# Patient Record
Sex: Female | Born: 1940
Health system: Southern US, Community
[De-identification: ages and names within clinical notes are randomized; demographics above are authoritative.]

## PROBLEM LIST (undated history)

## (undated) DIAGNOSIS — I10 Essential (primary) hypertension: Secondary | ICD-10-CM

## (undated) DIAGNOSIS — C50919 Malignant neoplasm of unspecified site of unspecified female breast: Secondary | ICD-10-CM

## (undated) DIAGNOSIS — I639 Cerebral infarction, unspecified: Secondary | ICD-10-CM

## (undated) DIAGNOSIS — K5792 Diverticulitis of intestine, part unspecified, without perforation or abscess without bleeding: Secondary | ICD-10-CM

## (undated) DIAGNOSIS — M779 Enthesopathy, unspecified: Secondary | ICD-10-CM

## (undated) DIAGNOSIS — Z87448 Personal history of other diseases of urinary system: Secondary | ICD-10-CM

## (undated) DIAGNOSIS — I749 Embolism and thrombosis of unspecified artery: Secondary | ICD-10-CM

## (undated) HISTORY — DX: Essential (primary) hypertension: I10

## (undated) HISTORY — DX: Cerebral infarction, unspecified: I63.9

## (undated) HISTORY — DX: Personal history of other diseases of urinary system: Z87.448

## (undated) HISTORY — PX: TEAR DUCT PROBING: SHX793

## (undated) HISTORY — DX: Enthesopathy, unspecified: M77.9

## (undated) HISTORY — DX: Malignant neoplasm of unspecified site of unspecified female breast: C50.919

## (undated) HISTORY — DX: Diverticulitis of intestine, part unspecified, without perforation or abscess without bleeding: K57.92

## (undated) HISTORY — DX: Embolism and thrombosis of unspecified artery: I74.9

---

## 1966-07-31 HISTORY — PX: BREAST BIOPSY: SHX20

## 1970-07-31 HISTORY — PX: OTHER SURGICAL HISTORY: SHX169

## 1972-07-31 HISTORY — PX: APPENDECTOMY: SHX54

## 1985-07-31 HISTORY — PX: ABDOMINAL HYSTERECTOMY: SHX81

## 2000-07-31 HISTORY — PX: NASAL SINUS SURGERY: SHX719

## 2001-11-19 ENCOUNTER — Encounter: Payer: Self-pay | Admitting: *Deleted

## 2001-11-19 ENCOUNTER — Emergency Department (HOSPITAL_COMMUNITY): Admission: EM | Admit: 2001-11-19 | Discharge: 2001-11-19 | Payer: Self-pay | Admitting: *Deleted

## 2002-07-25 ENCOUNTER — Encounter: Payer: Self-pay | Admitting: *Deleted

## 2002-07-25 ENCOUNTER — Emergency Department (HOSPITAL_COMMUNITY): Admission: EM | Admit: 2002-07-25 | Discharge: 2002-07-25 | Payer: Self-pay | Admitting: Emergency Medicine

## 2005-06-06 ENCOUNTER — Ambulatory Visit (HOSPITAL_COMMUNITY): Admission: RE | Admit: 2005-06-06 | Discharge: 2005-06-06 | Payer: Self-pay | Admitting: Internal Medicine

## 2005-06-13 ENCOUNTER — Encounter: Admission: RE | Admit: 2005-06-13 | Discharge: 2005-06-13 | Payer: Self-pay | Admitting: Internal Medicine

## 2006-10-01 ENCOUNTER — Ambulatory Visit (HOSPITAL_COMMUNITY): Admission: RE | Admit: 2006-10-01 | Discharge: 2006-10-01 | Payer: Self-pay | Admitting: Internal Medicine

## 2006-12-22 ENCOUNTER — Emergency Department (HOSPITAL_COMMUNITY): Admission: EM | Admit: 2006-12-22 | Discharge: 2006-12-22 | Payer: Self-pay | Admitting: Emergency Medicine

## 2007-03-15 ENCOUNTER — Ambulatory Visit (HOSPITAL_COMMUNITY): Admission: RE | Admit: 2007-03-15 | Discharge: 2007-03-15 | Payer: Self-pay | Admitting: Internal Medicine

## 2007-03-20 ENCOUNTER — Ambulatory Visit (HOSPITAL_COMMUNITY): Admission: RE | Admit: 2007-03-20 | Discharge: 2007-03-20 | Payer: Self-pay | Admitting: Internal Medicine

## 2007-11-19 ENCOUNTER — Inpatient Hospital Stay (HOSPITAL_COMMUNITY): Admission: EM | Admit: 2007-11-19 | Discharge: 2007-11-22 | Payer: Self-pay | Admitting: Emergency Medicine

## 2007-11-20 ENCOUNTER — Ambulatory Visit: Payer: Self-pay | Admitting: Gastroenterology

## 2007-11-21 ENCOUNTER — Ambulatory Visit: Payer: Self-pay | Admitting: Gastroenterology

## 2007-11-21 ENCOUNTER — Encounter: Payer: Self-pay | Admitting: Gastroenterology

## 2007-12-12 ENCOUNTER — Ambulatory Visit (HOSPITAL_COMMUNITY): Admission: RE | Admit: 2007-12-12 | Discharge: 2007-12-12 | Payer: Self-pay | Admitting: Internal Medicine

## 2008-01-21 ENCOUNTER — Ambulatory Visit: Payer: Self-pay | Admitting: Gastroenterology

## 2008-05-06 ENCOUNTER — Ambulatory Visit: Payer: Self-pay | Admitting: Gastroenterology

## 2008-12-14 ENCOUNTER — Ambulatory Visit (HOSPITAL_COMMUNITY): Admission: RE | Admit: 2008-12-14 | Discharge: 2008-12-14 | Payer: Self-pay | Admitting: Internal Medicine

## 2008-12-24 ENCOUNTER — Ambulatory Visit (HOSPITAL_COMMUNITY): Admission: RE | Admit: 2008-12-24 | Discharge: 2008-12-24 | Payer: Self-pay | Admitting: Internal Medicine

## 2008-12-29 ENCOUNTER — Encounter (INDEPENDENT_AMBULATORY_CARE_PROVIDER_SITE_OTHER): Payer: Self-pay | Admitting: Diagnostic Radiology

## 2008-12-29 ENCOUNTER — Encounter: Admission: RE | Admit: 2008-12-29 | Discharge: 2008-12-29 | Payer: Self-pay | Admitting: Internal Medicine

## 2009-01-04 ENCOUNTER — Ambulatory Visit (HOSPITAL_COMMUNITY): Admission: RE | Admit: 2009-01-04 | Discharge: 2009-01-04 | Payer: Self-pay | Admitting: Internal Medicine

## 2009-01-22 ENCOUNTER — Encounter (INDEPENDENT_AMBULATORY_CARE_PROVIDER_SITE_OTHER): Payer: Self-pay | Admitting: General Surgery

## 2009-01-22 ENCOUNTER — Inpatient Hospital Stay (HOSPITAL_COMMUNITY): Admission: RE | Admit: 2009-01-22 | Discharge: 2009-01-24 | Payer: Self-pay | Admitting: General Surgery

## 2009-01-22 HISTORY — PX: MASTECTOMY MODIFIED RADICAL: SUR848

## 2009-02-24 ENCOUNTER — Ambulatory Visit (HOSPITAL_COMMUNITY): Payer: Self-pay | Admitting: Oncology

## 2009-03-03 ENCOUNTER — Ambulatory Visit (HOSPITAL_COMMUNITY): Admission: RE | Admit: 2009-03-03 | Discharge: 2009-03-03 | Payer: Self-pay | Admitting: Oncology

## 2009-05-31 ENCOUNTER — Encounter: Payer: Self-pay | Admitting: Urgent Care

## 2009-08-24 ENCOUNTER — Ambulatory Visit (HOSPITAL_COMMUNITY): Payer: Self-pay | Admitting: Oncology

## 2009-12-16 ENCOUNTER — Ambulatory Visit (HOSPITAL_COMMUNITY): Admission: RE | Admit: 2009-12-16 | Discharge: 2009-12-16 | Payer: Self-pay | Admitting: Oncology

## 2010-02-17 ENCOUNTER — Ambulatory Visit: Payer: Self-pay | Admitting: Oncology

## 2010-02-22 ENCOUNTER — Encounter (HOSPITAL_COMMUNITY): Admission: RE | Admit: 2010-02-22 | Discharge: 2010-03-24 | Payer: Self-pay | Admitting: Oncology

## 2010-02-22 ENCOUNTER — Ambulatory Visit (HOSPITAL_COMMUNITY): Payer: Self-pay | Admitting: Oncology

## 2010-07-13 ENCOUNTER — Encounter: Payer: Self-pay | Admitting: Orthopedic Surgery

## 2010-07-13 ENCOUNTER — Emergency Department (HOSPITAL_COMMUNITY)
Admission: EM | Admit: 2010-07-13 | Discharge: 2010-07-13 | Payer: Self-pay | Source: Home / Self Care | Admitting: Emergency Medicine

## 2010-07-31 DIAGNOSIS — M779 Enthesopathy, unspecified: Secondary | ICD-10-CM

## 2010-07-31 HISTORY — DX: Enthesopathy, unspecified: M77.9

## 2010-08-09 ENCOUNTER — Encounter: Payer: Self-pay | Admitting: Orthopedic Surgery

## 2010-08-10 ENCOUNTER — Ambulatory Visit
Admission: RE | Admit: 2010-08-10 | Discharge: 2010-08-10 | Payer: Self-pay | Source: Home / Self Care | Attending: Orthopedic Surgery | Admitting: Orthopedic Surgery

## 2010-08-10 DIAGNOSIS — M654 Radial styloid tenosynovitis [de Quervain]: Secondary | ICD-10-CM | POA: Insufficient documentation

## 2010-08-21 ENCOUNTER — Encounter: Payer: Self-pay | Admitting: Internal Medicine

## 2010-08-22 ENCOUNTER — Encounter: Payer: Self-pay | Admitting: Internal Medicine

## 2010-08-23 ENCOUNTER — Ambulatory Visit (HOSPITAL_COMMUNITY)
Admission: RE | Admit: 2010-08-23 | Discharge: 2010-08-30 | Payer: Self-pay | Source: Home / Self Care | Attending: Oncology | Admitting: Oncology

## 2010-08-23 ENCOUNTER — Encounter (HOSPITAL_COMMUNITY)
Admission: RE | Admit: 2010-08-23 | Discharge: 2010-08-30 | Payer: Self-pay | Source: Home / Self Care | Attending: Oncology | Admitting: Oncology

## 2010-08-31 ENCOUNTER — Ambulatory Visit (INDEPENDENT_AMBULATORY_CARE_PROVIDER_SITE_OTHER): Payer: Medicare Other | Admitting: Orthopedic Surgery

## 2010-08-31 ENCOUNTER — Ambulatory Visit: Admit: 2010-08-31 | Payer: Self-pay | Admitting: Orthopedic Surgery

## 2010-08-31 ENCOUNTER — Encounter: Payer: Self-pay | Admitting: Orthopedic Surgery

## 2010-08-31 DIAGNOSIS — M654 Radial styloid tenosynovitis [de Quervain]: Secondary | ICD-10-CM

## 2010-09-01 NOTE — Assessment & Plan Note (Signed)
Summary: ap er left wrist pain may need new xr/bcbs/bsf   Vital Signs:  Patient profile:   70 year old female Height:      66.5 inches Weight:      137 pounds Pulse rate:   76 / minute Resp:     16 per minute  Vitals Entered By: Fuller Canada MD (August 10, 2010 9:05 AM)  Visit Type:  new patient Referring Provider:  ap er Primary Provider:  Dr. Felecia Shelling  CC:  left wrist.  History of Present Illness: I saw [name] in the office today for an initial visit.  She is a 70 years old woman with the complaint of:  left wrist and thumb pain.  No injury.  07/13/10 left wrist xrays [facility].  Meds: Vitamin D, Calcium plus D, Anastrozole, Lovastatin, Nexium, Metoprolol.  70 year old female complains of pain over the LEFT wrist and thumb described as sharp, stabbing and burning. She rates her pain 10 out of 10 and says it is constant. She denies any trauma. It started in November of last year. She's had a history of carpal tunnel syndrome.  She is wearing a rhino splint.    Allergies (verified): No Known Drug Allergies  Past History:  Past Medical History: soft bones htn cholesterol COPD acid reflux  Past Surgical History: hysterectomy sinus appendix cancer rt breast removed cyst from left breast belly button  Family History: FH of Cancer:  Family History of Diabetes Family History of Arthritis  Social History: Patient is divorced.  retired smokes 1/2 ppd no alcohol 3 cups of coffee per day GED  Review of Systems Constitutional:  Denies weight loss, weight gain, fever, chills, and fatigue. Cardiovascular:  Denies chest pain, palpitations, fainting, and murmurs. Respiratory:  Complains of short of breath; denies wheezing, couch, tightness, pain on inspiration, and snoring . Gastrointestinal:  Complains of blood in your stools; denies heartburn, nausea, vomiting, diarrhea, and constipation. Genitourinary:  Denies frequency, urgency, difficulty urinating,  painful urination, flank pain, and bleeding in urine. Neurologic:  Denies numbness, tingling, unsteady gait, dizziness, tremors, and seizure. Musculoskeletal:  Complains of joint pain; denies swelling, instability, stiffness, redness, heat, and muscle pain. Endocrine:  Denies excessive thirst, exessive urination, and heat or cold intolerance. Psychiatric:  Denies nervousness, depression, anxiety, and hallucinations. Skin:  Denies changes in the skin, poor healing, rash, itching, and redness. HEENT:  Denies blurred or double vision, eye pain, redness, and watering. Immunology:  Complains of seasonal allergies; denies sinus problems and allergic to bee stings. Hemoatologic:  Denies easy bleeding and brusing.  Physical Exam  Skin:  intact without lesions or rashes Psych:  alert and cooperative; normal mood and affect; normal attention span and concentration   Wrist/Hand Exam  General:    Well-developed, well-nourished, in no acute distress; alert and oriented x 3.    Skin:    Intact with no erythema; no scarring.    Inspection:    swelling: LEFT thumb  Palpation:    tenderness over the LEFT thumb 1st extensor compartment  Vascular:    normal  Sensory:    normal   Motor:    normal   Reflexes:    n/t  Wrist Exam:    Left:    Stability:  stable    Tenderness:  no  Hand Exam:    Left:    Inspection:  Abnormal    Palpation:  Abnormal    the thumb does subluxate in and out of joint, but it's a partial subluxation  is not symptomatic. There is no grinding   Impression & Recommendations:  Problem # 1:  DEQUERVAIN'S (ICD-727.04)  The x-rays were done at [facility]. The report and the films have been reviewed. the basilar joint of the thumb is subluxated approximately 50%.  This was not seem to be symptomatic for the patient.  We did inject for de Quervain's syndrome  Verbal consent was obtained: The left thumb was prepped with ethyl chloride and injected with 1:1  injection of .25% sensorcaine, 1cc  and 40 mg of depomedrol, 1cc. There were no complications.  Orders: New Patient Level III (16109) Injection, Tendon / Ligament (60454) Depo- Medrol 40mg  (J1030)  Patient Instructions: 1)  Tendonitis of the thumb  2)  You have received an injection of cortisone today. You may experience increased pain at the injection site. Apply ice pack to the area for 20 minutes every 2 hours and take 2 xtra strength tylenol every 8 hours. This increased pain will usually resolve in 24 hours. The injection will take effect in 3-10 days.   3)  wear splint  4)  take ibuprofen  5)  Please schedule a follow-up appointment in 3 weeks.   Orders Added: 1)  New Patient Level III [09811] 2)  Injection, Tendon / Ligament [20550] 3)  Depo- Medrol 40mg  [J1030]

## 2010-09-01 NOTE — Letter (Signed)
Summary: History form  History form   Imported By: Jacklynn Ganong 08/11/2010 08:18:53  _____________________________________________________________________  External Attachment:    Type:   Image     Comment:   External Document

## 2010-09-07 NOTE — Assessment & Plan Note (Signed)
Summary: 3 wk followup lt wrist/bcbs/wkj   Visit Type:  Follow-up Referring Provider:  ap er Primary Provider:  Dr. Felecia Shelling  CC:  left wrist.  History of Present Illness: I saw [name] in the office today for a 3 week  followup visit.  She is a 70 years old woman with the complaint of:  left wrist  No injury.  07/13/10 left wrist xrays [facility].  Meds: Vitamin D, Calcium plus D, Anastrozole, Lovastatin, Nexium, Metoprolol.  Treatment: Splint, ibuprofen.  Complaints: She is doing alot better.  Allergies: No Known Drug Allergies   Other Orders: Est. Patient Level III (13244)  Patient Instructions: 1)  follow up as needed for the wrist 2)  call us if you would like referral to Dr. Malvin Johns for removal of Lipoma from left forearm.   Orders Added: 1)  Est. Patient Level III [01027]  Appended Document: 3 wk followup lt wrist/bcbs/wkj   LEFT thumb and wrist examination is normal. No tenderness. No pain with radiation or radial deviation or ulnar deviation.  She does have a mass over the LEFT forearm, which appears to me to be a lipoma,  I've asked her to go to Dr. Malvin Johns and she was taken out. She declined at this time says it doesn't really bother her that much.

## 2010-09-28 ENCOUNTER — Other Ambulatory Visit: Payer: Self-pay | Admitting: Ophthalmology

## 2010-09-28 ENCOUNTER — Encounter (HOSPITAL_COMMUNITY): Payer: Medicare Other | Attending: Ophthalmology

## 2010-09-28 DIAGNOSIS — Z01810 Encounter for preprocedural cardiovascular examination: Secondary | ICD-10-CM | POA: Insufficient documentation

## 2010-09-28 DIAGNOSIS — Z01812 Encounter for preprocedural laboratory examination: Secondary | ICD-10-CM | POA: Insufficient documentation

## 2010-09-28 LAB — BASIC METABOLIC PANEL
GFR calc Af Amer: >60 mL/min (ref >60)
GFR calc non Af Amer: >60 mL/min (ref >60)
Potassium: 4 mEq/L (ref 3.5–5.1)
Sodium: 140 mEq/L (ref 135–145)

## 2010-09-28 LAB — HEMOGLOBIN AND HEMATOCRIT, BLOOD
HCT: 39.6 % (ref 36.0–46.0)
Hemoglobin: 13.3 g/dL (ref 12.0–15.0)

## 2010-10-03 ENCOUNTER — Ambulatory Visit (HOSPITAL_COMMUNITY)
Admission: RE | Admit: 2010-10-03 | Discharge: 2010-10-03 | Disposition: A | Discharge status: Home / Self Care | Payer: Medicare Other | Source: Ambulatory Visit | Attending: Ophthalmology | Admitting: Ophthalmology

## 2010-10-03 DIAGNOSIS — J4489 Other specified chronic obstructive pulmonary disease: Secondary | ICD-10-CM | POA: Insufficient documentation

## 2010-10-03 DIAGNOSIS — I10 Essential (primary) hypertension: Secondary | ICD-10-CM | POA: Insufficient documentation

## 2010-10-03 DIAGNOSIS — Z79899 Other long term (current) drug therapy: Secondary | ICD-10-CM | POA: Insufficient documentation

## 2010-10-03 DIAGNOSIS — H2510 Age-related nuclear cataract, unspecified eye: Secondary | ICD-10-CM | POA: Insufficient documentation

## 2010-10-03 DIAGNOSIS — J449 Chronic obstructive pulmonary disease, unspecified: Secondary | ICD-10-CM | POA: Insufficient documentation

## 2010-10-15 LAB — COMPREHENSIVE METABOLIC PANEL
ALT: 15 U/L (ref 0–35)
AST: 21 U/L (ref 0–37)
Alkaline Phosphatase: 53 U/L (ref 39–117)
CO2: 28 mEq/L (ref 19–32)
GFR calc non Af Amer: >60 mL/min (ref >60)
Glucose, Bld: 115 mg/dL — ABNORMAL HIGH (ref 70–99)
Potassium: 4.3 mEq/L (ref 3.5–5.1)
Sodium: 136 mEq/L (ref 135–145)
Total Protein: 7.4 g/dL (ref 6.0–8.3)

## 2010-10-15 LAB — CBC
HCT: 37.8 % (ref 36.0–46.0)
Hemoglobin: 13.2 g/dL (ref 12.0–15.0)
WBC: 8.2 10*3/uL (ref 4.0–10.5)

## 2010-11-07 LAB — CBC
HCT: 31.8 % — ABNORMAL LOW (ref 36.0–46.0)
HCT: 32 % — ABNORMAL LOW (ref 36.0–46.0)
Hemoglobin: 11.3 g/dL — ABNORMAL LOW (ref 12.0–15.0)
Hemoglobin: 11.5 g/dL — ABNORMAL LOW (ref 12.0–15.0)
MCHC: 35.4 g/dL (ref 30.0–36.0)
MCHC: 36.2 g/dL — ABNORMAL HIGH (ref 30.0–36.0)
MCV: 91.9 fL (ref 78.0–100.0)
MCV: 92.1 fL (ref 78.0–100.0)
MCV: 93.3 fL (ref 78.0–100.0)
Platelets: 107 10*3/uL — ABNORMAL LOW (ref 150–400)
Platelets: 109 10*3/uL — ABNORMAL LOW (ref 150–400)
Platelets: 133 10*3/uL — ABNORMAL LOW (ref 150–400)
RBC: 3.46 MIL/uL — ABNORMAL LOW (ref 3.87–5.11)
RDW: 12.9 % (ref 11.5–15.5)
RDW: 12.9 % (ref 11.5–15.5)
RDW: 13.1 % (ref 11.5–15.5)
WBC: 5.8 10*3/uL (ref 4.0–10.5)
WBC: 6 10*3/uL (ref 4.0–10.5)

## 2010-11-07 LAB — COMPREHENSIVE METABOLIC PANEL
AST: 17 U/L (ref 0–37)
Albumin: 3.9 g/dL (ref 3.5–5.2)
Chloride: 102 mEq/L (ref 96–112)
Creatinine, Ser: 0.78 mg/dL (ref 0.4–1.2)
GFR calc Af Amer: >60 mL/min (ref >60)
Total Bilirubin: 0.5 mg/dL (ref 0.3–1.2)
Total Protein: 7 g/dL (ref 6.0–8.3)

## 2010-11-07 LAB — DIFFERENTIAL
Basophils Absolute: 0 10*3/uL (ref 0.0–0.1)
Basophils Relative: 0 % (ref 0–1)
Eosinophils Absolute: 0.1 10*3/uL (ref 0.0–0.7)
Eosinophils Absolute: 0.1 10*3/uL (ref 0.0–0.7)
Eosinophils Relative: 2 % (ref 0–5)
Lymphs Abs: 1.5 10*3/uL (ref 0.7–4.0)
Lymphs Abs: 1.6 10*3/uL (ref 0.7–4.0)
Monocytes Absolute: 0.5 10*3/uL (ref 0.1–1.0)
Neutrophils Relative %: 64 % (ref 43–77)

## 2010-11-07 LAB — BASIC METABOLIC PANEL
BUN: 6 mg/dL (ref 6–23)
CO2: 29 mEq/L (ref 19–32)
Chloride: 106 mEq/L (ref 96–112)
GFR calc non Af Amer: >60 mL/min (ref >60)
Glucose, Bld: 137 mg/dL — ABNORMAL HIGH (ref 70–99)
Potassium: 3.7 mEq/L (ref 3.5–5.1)
Sodium: 139 mEq/L (ref 135–145)

## 2010-11-29 ENCOUNTER — Other Ambulatory Visit (HOSPITAL_COMMUNITY): Payer: Self-pay | Admitting: Oncology

## 2010-11-29 DIAGNOSIS — Z139 Encounter for screening, unspecified: Secondary | ICD-10-CM

## 2010-11-29 DIAGNOSIS — Z9011 Acquired absence of right breast and nipple: Secondary | ICD-10-CM

## 2010-12-13 NOTE — H&P (Signed)
[name], [name]                  ACCOUNT NO.:  [id number]   MEDICAL RECORD NO.:  [id number]          PATIENT TYPE:  INP   LOCATION:  A201                          FACILITY:  [facility]   PHYSICIAN:  Tesfaye D. Felecia Shelling, MD   DATE OF BIRTH:  November 14, 1940   DATE OF ADMISSION:  11/19/2007  DATE OF DISCHARGE:  [facility]                              HISTORY & PHYSICAL   CHIEF COMPLAINT:  Rectal bleeding.   HISTORY OF PRESENT ILLNESS:  This is a 70 year old female patient with a  history of hypertension, hyperlipidemia, osteoporosis, and  anxiety/depression disorder who was seen in the emergency room with the  above complaint.  The patient developed a sudden onset of rectal  bleeding about 3 days back.  The bleeding continued intermittently.  The  patient started feeling dizzy and weak.  She came to the emergency room  where she was evaluated.  She was found to have a maroon-red rectal  bleeding.  However, she was hemodynamically stable, and there was no  significant drop in hemoglobin and hematocrit.  CT scan of the abdomen  was done, which showed sign of diverticulosis and diverticulitis.  The  patient was admitted with IV antibiotics and IV fluids.   REVIEW OF SYSTEMS:  No headache, fever, chills, chest pain, shortness of  breath, palpitation, nausea, vomiting, dysuria, urgency, or frequency of  urination.  She had mild lower quadrant pain.   PAST MEDICAL HISTORY:  1. Hypertension.  2. Hyperlipidemia.  3. Recurrent bronchitis.  4. Nicotine addiction.  5. Osteoporosis.  6. Anxiety/depression disorder.   CURRENT MEDICATIONS:  1. Metoprolol 25 mg b.i.d.  2. Calcium with vitamin D 1 tablet p.o. t.i.d.  3. Naproxen 500 mg 2 tablets daily.  4. Lovastatin 40 mg daily.  5. Aspirin 81 mg daily.   SOCIAL HISTORY:  The patient is single.  She smokes about one-pack of  cigarettes per day.  No history of alcohol or substance abuse.   PHYSICAL EXAMINATION:  The patient is alert, awake, sick looking.  VITAL SIGNS:  Blood pressure 159/73, pulse 65, respiratory rate 22,  temperature 97.6 degrees Fahrenheit.  HEENT:  Pupils are equal and reactive.  NECK:  Supple.  CHEST:  Clear lung field, good air entry.  CARDIOVASCULAR SYSTEM:  First and second heart sound heard.  No murmur.  No gallop.  ABDOMEN:  Soft and lax.  Bowel sound is positive.  No mass or  organomegaly.  EXTREMITIES:  No leg edema.   LABORATORY DATA ON ADMISSION:  CBC:  WBC was 7.6, hemoglobin 12.2,  hematocrit 34.0, platelets 131.  CMP:  Sodium 137, potassium 3.7,  chloride 104, carbon dioxide 29, glucose 94, BUN 11, creatinine 0.9, and  calcium 8.9.  PT 13.3, INR 1.0.   ASSESSMENT:  1. Rectal bleed, probably secondary to diverticulosis.  2. Acute diverticulitis.  3. History of hypertension.  4. Nicotine addiction.  5. Osteoporosis.  6. Hyperlipidemia.   PLAN:  We will admit the patient and start on IV antibiotics.  We will  continue IV fluids.  We will continue to monitor her CBC,  BMP.  We will  do GI consult. We will continue regular medications.      Tesfaye D. Felecia Shelling, MD  Electronically Signed     TDF/MEDQ  D:  11/20/2007  T:  11/20/2007  Job:  [id number]

## 2010-12-13 NOTE — Consult Note (Signed)
[name], [name]                  ACCOUNT NO.:  [id number]   MEDICAL RECORD NO.:  [id number]          PATIENT TYPE:  INP   LOCATION:  A201                          FACILITY:  [facility]   PHYSICIAN:  [name], M.D.      DATE OF BIRTH:  [birth year]   DATE OF CONSULTATION:  11/20/2007  DATE OF DISCHARGE:                                 CONSULTATION   HISTORY OF PRESENT ILLNESS:  Patient is a 70 year old Caucasian female,  who presented with a two-day history of maroon-colored stools with blood  clots.  She states she felt well.  Monday evening, she got the urge to  have a bowel movement.  When she went to the bathroom, she passed  nothing but blood.  She describes it as large volume and deep red.  This  happened about three or four times on Monday.  She presented to the  emergency department yesterday, after it happened two or three more  times.  The last episode of bleeding was around 7 p.m. last night.  She  has had no real abdominal pain.  She has had some mild cramping in the  lower abdomen with the urge to have a bowel movement.  No nausea or  vomiting, fever or chills.  She does, over the past several months,  however, have a history of postprandial epigastric discomfort.  She  describes this as a knot that develops after she eats.  She takes Tums  with relief.  She is on chronic NSAIDs.  She previously had been on OTC  agents such as Advil and Aleve, but two months ago, was switched to  Naproxen by her PCP.  She does not take it daily, but does take Naproxen  several days weekly for arthritis.  She denies any dysphagia or  odynophagia, denies any genitourinary symptoms.  She complains of  chronic cough.  No shortness of breath or chest pain.   Upon presentation, she underwent a CT of the abdomen and pelvis.  She  was noted to have probable sludge in the gallbladder.  She has small,  fatty, retroperitoneal lesion near the kidneys, but they cannot  definitively say it was attached to  the kidneys.  Therefore, they are  recommending a followup CT in six months.  She had what appeared to be  mild, uncomplicated diverticulitis of the sigmoid colon, as well.   MEDICATIONS AT HOME:  1. Aspirin 81 mg daily.  2. Lovastatin 40 mg daily.  3. Naproxen 500 mg b.i.d. most days.  4. Metoprolol 25 mg b.i.d.  5. Calcium with vitamin D 500 mg t.i.d.  6. NitroQuick sublingual p.r.n., which she states she has not taken in      a very long time.  7. Fish oil one daily.   ALLERGIES:  No known drug allergies.   PAST MEDICAL HISTORY:  Hyperlipidemia, hypertension, osteoarthritis,  osteoporosis.  She has had sinus surgery, appendectomy, hysterectomy,  colonoscopy ten years ago with Dr. Arlyce Dice in [place], [place].  She  states this was normal, except it was a very difficult exam due to  tight  turns or kinking of the bowel.  She has never had an EGD.   FAMILY HISTORY:  Negative for chronic GI illnesses, colorectal cancer or  liver disease.   SOCIAL HISTORY:  She is divorced.  She has three children.  She helps  take care of her elderly mother.  She smokes about a half pack of  cigarettes daily, smoked over 50 years.  No alcohol use or drug use.   REVIEW OF SYSTEMS:  See HPI for GI.  She reports a mild weight-loss last  year, but states her weight has been stable for six months.  She  attributes this to stress.  CARDIOPULMONARY:  See HPI.  GENITOURINARY:  Denies any dysuria or hematuria.   PHYSICAL EXAM:  Temperature 97.9, pulse 57, respirations 20, blood  pressure 112/36, O2 sat is 97% on room air.  Height 66 inches, weight  59.8 kg.  GENERAL:  Pleasant, elderly, Caucasian female, in no acute distress.  She is accompanied by her daughter.  SKIN:  Warm and dry, no jaundice.  HEENT:  Sclerae anicteric.  Oropharyngeal mucosa moist and pink.  No  lesions, erythema or exudate.  No lymphadenopathy or thyromegaly.  CHEST:  Lungs are clear to auscultation.  CARDIAC EXAM:  Reveals  regular rate and rhythm, normal S1, S2.  No  murmurs, rubs or gallops.  ABDOMEN:  Positive bowel sounds.  Abdomen soft.  She had mild epigastric  tenderness to deep palpation.  She had mild left lower quadrant  tenderness to deep palpation.  No rebound tenderness or guarding.  No  hepatosplenomegaly or masses.  No abdominal bruits or hernias.  LOWER EXTREMITIES:  No edema.  RECTAL:  Digital rectal exam in the ED was done and was found to have  maroon-colored blood on the glove.   LABORATORY DATA:  White count was 5600, hemoglobin on admission 12.2,  today is 12.1, platelets 128,000.  Sodium 137, potassium 3.7, BUN 11,  creatinine 0.79, glucose 94.  LFTs are normal.  INR is 1.   IMPRESSION:  Patient is a 70 year old Caucasian female with a two-day  history of maroon-colored stool with blood clots.  This is in the  setting of aspirin and NSAID use.  CT suggests mild diverticulitis.  Dr.  Cira Servant is in the process of reviewing these films.  She really does not  describe any symptoms suggestive of mild diverticulitis, but currently  being treated for such.  I suspect she has had a diverticular bleed.  Given her ongoing NSAID use, cannot rule out NSAID-induced GI bleed,  however.  Regarding her chronic epigastric discomfort, this is somewhat  worrisome in the setting of chronic NSAID use.  Cannot rule out peptic  ulcer disease.  She will need to have further investigation.  Notably,  she has been very hemodynamically stable.  She has had no overt bleeding  in over 12 hours at this point.   RECOMMENDATIONS:  1. We will continue to hold aspirin.  Avoid NSAIDs.  2. Continue PPI.  3. Continue antibiotics for now.  Dr. Cira Servant reviewing films, will make      further recommendations.  4. She will need to have a colonoscopy and upper endoscopy.  Would      prefer to wait two weeks for any possible diverticulitis to be      treated.  If she has persistent bleeding, however, we would pursue       workup sooner.  5. She will need to have a followup  CT in six months to further look      at the retroperitoneal lesion seen on yesterday's study.   We would like to thank Dr. Felecia Shelling for allowing [place] to take part in the  care of this patient.      Tana Coast, P.A.      [name], M.D.  Electronically Signed    LL/MEDQ  D:  11/20/2007  T:  11/20/2007  Job:  [id number]   cc:   Tesfaye D. Felecia Shelling, MD  Fax: [phone]

## 2010-12-13 NOTE — Op Note (Signed)
[name], [name]                  ACCOUNT NO.:  [id number]   MEDICAL RECORD NO.:  [id number]          PATIENT TYPE:  INP   LOCATION:  A311                          FACILITY:  [facility]   PHYSICIAN:  [name], M.D.      DATE OF BIRTH:  Jun 20, 1941   DATE OF PROCEDURE:  DATE OF DISCHARGE:                               OPERATIVE REPORT   PROCEDURE:  1. Colonoscopy.  2. Esophagogastroduodenoscopy with cold forceps biopsy.   INDICATIONS FOR PROCEDURE:  [name] is a 70 year old female who  presented with abdominal pain and rectal bleeding.  She had a CT scan  which suggested chronic stranding and mild thickening of the sigmoid  colon.  The initial presumption was that she had diverticulitis.  She  denied any diarrhea or constipation.  After reviewing the CT scan, it  was less likely that she had diverticulitis and was having a  diverticular bleed or ischemic colitis.  The colonoscopy is being  performed to evaluate rectal bleeding.  The esophagogastroduodenoscopy  is being performed to investigate her epigastric abdominal pain while on  NSAIDs.   FINDINGS:  1. Extremely tortuous sigmoid colon which required multiple changes in      position and the pediatric colonoscope to reach the cecum.  2. Multiple sigmoid diverticula with occasional thickened folds but no      evidence of erythema or inflammatory changes or mass.  No polyps or      arteriovenous malformations.  3. Normal retroflexed view of the rectum.  4. Normal esophagus without evidence of Barrett's mass, erosion,      ulceration or stricture.  5. 2-3 cm hiatal hernia.  Multiple erosions seen in the body and      antrum.  Biopsies obtained via cold forceps to evaluate for H.      pylori gastritis.  6. Normal duodenal bulb, second portion of the duodenum.  7. No old blood or fresh blood seen in the stomach, small intestines      or colon.   RECOMMENDATIONS:  1. Screening colonoscopy in 10 years.  2  Add PPI as an  outpatient indefinitely this, especially while on  aspirin and naproxen.  3  Avoid naproxen for 30 days and avoid aspirin for 14 days.  1. No anticoagulation for 5 days.  2. Advance diet.  3. We will await biopsies.  4. Follow up in 2 months with Dr. Cira Servant for abdominal pain.   MEDICATIONS:  Demerol 75 mg IV, Versed 6 mg IV.   PROCEDURE TECHNIQUE:  Physical exam was performed.  Informed consent was  obtained from the patient after explaining benefits, risks and  alternatives to the procedure.  The patient connected to monitor and  placed in left lateral position.  Continuous oxygen was provided by  nasal cannula IV medicine administered through an indwelling cannula.  After administration of sedation and rectal exam, the patient's rectum  was intubated and scope was advanced under direct visualization to the  cecum.  Scope was removed slowly by carefully examining the color,  texture, anatomy and integrity of mucosa on  the way out.  The patient  was recovered in endoscopy and discharged to the floor in satisfactory  condition.   After the colonoscopy, the patient's esophagus was intubated.  The scope  was advanced under direct visualization to the second portion of the  duodenum.  Scope was removed slowly by carefully examine the color,  texture, anatomy and integrity mucosa on the way out.   PATH:  Mild gastritis.      [name], M.D.  Electronically Signed     SM/MEDQ  D:  11/21/2007  T:  11/21/2007  Job:  [id number]   cc:   Tesfaye D. Felecia Shelling, MD  Fax: [phone]

## 2010-12-13 NOTE — Discharge Summary (Signed)
[name], [name]                  ACCOUNT NO.:  [id number]   MEDICAL RECORD NO.:  [id number]          PATIENT TYPE:  INP   LOCATION:  A311                          FACILITY:  [facility]   PHYSICIAN:  Tesfaye D. Felecia Shelling, MD   DATE OF BIRTH:  [birth year]   DATE OF ADMISSION:  11/19/2007  DATE OF DISCHARGE:  04/24/2009LH                               DISCHARGE SUMMARY   DISCHARGE DIAGNOSES:  1. Rectal bleed secondary to diverticulosis.  2. Acute diverticulitis.  3. Anemia secondary to rectal bleed.  4. Hypertension.  5. Hyperlipidemia.  6. History of recurrent bronchitis.  7. Osteoporosis.  8. Anxiety depression disorder.  9. Nicotine addiction.   DISCHARGE MEDICATIONS:  1. Calcium with vitamin D 1 tablet p.o. t.i.d.  2. Metoprolol 25 mg b.i.d.  3. Lovastatin 40 mg daily.  4. Prilosec 20 mg daily.  5. Flagyl 500 mg p.o. t.i.d. for 7 days.  6. Ciprofloxacin 500 mg p.o. b.i.d. for 7 days.  7. NitroQuick sublingual p.r.n. for chest pain.   DISPOSITION:  The patient was discharged to home in stable condition.   HOSPITAL COURSE:  This is a 70 years old female patient with history of  multiple medical illnesses who was brought to emergency room due to  rectal bleed.  The patient had an episode of rectal bleed for 3 days  before she was admitted.  The patient started feeling dizzy and weak.  She was evaluated in the emergency room, and she was found to have a  marrow red blood in her rectum.  Her CT scan of the abdomen showed sign  of diverticulosis and diverticulitis.  Her hemoglobin and hematocrit was  not significantly dropped.  She was hemodynamically stable.  The patient  was admitted and was started on IV antibiotics.  GI consult was done,  and endoscopy and colonoscopy was performed.  The patient was found to  have diverticulosis and sign of gastritis.  She is continued on proton  pump  inhibitor as well as oral IV antibiotics.  Over the hospital stay, the  patient improved.  Her  rectal bleeding stopped.  Her aspirin and  naproxen was discontinued.  The patient was discharged to home in stable  condition with oral antibiotics to be followed in the office.      Tesfaye D. Felecia Shelling, MD  Electronically Signed     TDF/MEDQ  D:  12/10/2007  T:  12/10/2007  Job:  [id number]

## 2010-12-13 NOTE — Assessment & Plan Note (Signed)
[name], [name]                   CHART#:  [id number]   DATE:  05/06/2008                       DOB:  Jul 03, 1941   PROBLEM LIST:  1. Tortuous sigmoid colon requiring pediatric colonoscope to reach the      cecum in April 2009 with evidence of frequent sigmoid diverticula      with thickened fold.  2. EGD in April 2009 revealed hiatal hernia, gastritis likely      nonsteroidal anti-inflammatory drug induced, biopsy negative for      Helicobacter pylori.  She had mild chronic active gastritis.  3. Tobacco abuse.  4. Hypertension.  5. Hyperlipidemia.  6. Osteoarthritis.  7. Osteoporosis.  8. Appendectomy.  9. Hysterectomy.   SUBJECTIVE:  The patient presents in followup.  She states she has been  doing very well.  She denies any abdominal pain.  She has had no  problems with her bowel movements.  No blood in the stool or melena.  Denies any heartburn.  She states she sometimes gets a gas bubble under  her left rib cage which goes away on its own.  She has not been taking  naproxen very often, but states that in the winter month, she tends to  take it more for her chronic right shoulder pain.  She states that she  plans on using that only occasionally.  She is on aspirin 81 mg daily  for heart health.  She was advised to take this by her cardiologist when  she had chest pain a year ago.   CURRENT MEDICATIONS:  Calcium with vitamin D b.i.d., NitroQuick p.r.n.,  lovastatin 40 mg daily, omeprazole 20 mg daily, metoprolol 25 mg b.i.d.,  aspirin 81 mg daily, naproxen rarely uses.   ALLERGIES:  No known drug allergies.   PHYSICAL EXAMINATION:  VITAL SIGNS:  Weight 138 stable, temperature  98.2, blood pressure 128/70, and pulse 60.  GENERAL:  Pleasant, well-nourished, well-developed Caucasian female in  no acute distress.  SKIN:  Warm and dry.  No jaundice.  ABDOMEN:  Positive bowel sounds.  Abdomen is soft, nontender,  nondistended.  No organomegaly or masses.  No rebound or  guarding.  LOWER EXTREMITIES:  No edema.   IMPRESSION:  History of chronic active gastritis likely due to aspirin  and naproxen use.  Clinically, she appears to be doing well.  She is  going to be maintained on a proton pump inhibitor as long as she  requires nonsteroidal anti-inflammatory drugs or aspirin.  She has had  no further gastrointestinal bleeding.   PLAN:  1. She may use naproxen as needed, but she has been advised if she      develops any abdominal pain, she should stop it.  2. She will call us with any further problems.  3. She will be due for screening colonoscopy in 10 years.       [name], P.A.  Electronically Signed     [name], M.D.  Electronically Signed    LL/MEDQ  D:  05/06/2008  T:  05/07/2008  Job:  [id number]   cc:   [name] D. Felecia Shelling, MD

## 2010-12-13 NOTE — Assessment & Plan Note (Signed)
NAMEBRYNNLEY, [name]                   CHART#:  [id number]   DATE:  01/21/2008                       DOB:  1940/09/29   PROBLEM LIST:  1. Tortuous sigmoid colon requiring pediatric colonoscope to reach the      cecum in April 2009.  2. Frequent sigmoid diverticula with thickened folds (occasionally).  3. Hiatal hernia.  4. Gastritis, likely nonsteroidal antiinflammatory drug induced.      Biopsies negative for Helicobacter pylori in April 2009.  5. Tobacco abuse.  6. Hypertension.  7. Hyperlipidemia.  8. Osteoarthritis.  9. Osteoporosis.  10.Appendectomy.  11.Hysterectomy.   SUBJECTIVE:  The patient is a 70 year old female who is seen as a return-  patient visit.  She says her stomach is better.  She is having sharp  pains in her right shoulder, because she not been taken her naproxen.  She denies any abdominal pain.  She is working on stopping smoking.   MEDICATIONS:  1. Calcium with vitamin D t.i.d.  2. NitroQuick as needed.  3. Lovastatin 40 mg daily.  4. Omeprazole 20 mg daily.  5. Metoprolol 25 mg b.i.d.   OBJECTIVE:  PHYSICAL EXAM:  Weight 139 pounds, height 5 feet 6 inches,  temperature 98.2, blood pressure 130/70, and pulse 64.  GENERAL:  She is in no apparent distress.  Alert and oriented x4.  LUNGS:  Clear to auscultation bilaterally.CARDIOVASCULAR:  Regular rate  and rhythm.  ABDOMEN:  Bowel sounds present.  Soft, nontender, and  nondistended.   ASSESSMENT:  The patient is a 70 year old female who had chronic active  gastritis, which is likely due to aspirin and naproxen.  She has been on  a proton pump inhibitor and off her nonsteroidal antiinflammatory drug  for almost 2 months.  Her symptoms have improved.  Thank you for  allowing me to see this patient in consultation.  My recommendations  follow.   RECOMMENDATIONS:  1. She may restart her naproxen at 250 mg b.i.d., and if she does not      have any abdominal pain, then she may re-introduce aspirin daily.  2. Return-patient visit in 4 months to reassess her abdominal pain      after the initiation of anti-inflammatory drugs.       [name], M.D.  Electronically Signed     SM/MEDQ  D:  01/21/2008  T:  01/22/2008  Job:  [id number]   cc:   Lauren D. Felecia Shelling, MD

## 2010-12-13 NOTE — Op Note (Signed)
[name], [name]                  ACCOUNT NO.:  [id number]   MEDICAL RECORD NO.:  [id number]          PATIENT TYPE:  INP   LOCATION:  A326                          FACILITY:  [facility]   PHYSICIAN:  Dalia Heading, M.D.  DATE OF BIRTH:  11-Feb-1941   DATE OF PROCEDURE:  01/22/2009  DATE OF DISCHARGE:                               OPERATIVE REPORT   PREOPERATIVE DIAGNOSIS:  Right breast carcinoma, clinical stage I.   POSTOPERATIVE DIAGNOSIS:  Right breast carcinoma, clinical stage I.   PROCEDURE:  Right modified radical mastectomy.   SURGEON:  Dalia Heading, MD   ANESTHESIA:  General endotracheal.   INDICATIONS:  The patient is a 70 year old white female, who presents  with biopsy-proven right breast carcinoma.  After extensive discussion  with the patient, she has elected to proceed with a right modified  radical mastectomy.  The risks and benefits of the procedure including  bleeding, infection, pain, nerve injury, and arm swelling were fully  explained to the patient, gave informed consent.   PROCEDURE NOTE:  The patient was placed in supine position.  After  induction of general endotracheal anesthesia, the right breast and  axilla were prepped and draped using the usual sterile technique with  DuraPrep.  Surgical site confirmation was performed.   An elliptical incision was made medial to lateral around the right  nipple.  A superior flap was then formed to the clavicle and inferior  flap formed to the chest wall.  The breast was then removed from the  pectoralis major muscle using Bovie electrocautery from medial to  lateral.  A short suture was placed superiorly and a long suture placed  laterally for orientation purposes.  A level II right axillary  dissection was then performed.  Care was taken to avoid the axillary  vein and thoracodorsal artery vein and nerves.  The intercostal brachial  nerve had to be sacrificed due to multiple swollen lymph nodes in the  right  axilla.  Any bleeding was controlled using small clips.  The right  axillary contents as well as the right breast were then removed from the  operative field and sent to pathology for further examination.  The  wound was copiously irrigated with normal saline.  Two Jackson-Pratt  drains were then placed.  The superior drain 1 under the flap and the  inferior flap went into the axilla.  Both were secured at the skin level  using 3-0 Prolene interrupted sutures.  The incision was closed using 2-  0 Vicryl interrupted sutures.  The skin was closed using staples.  Betadine ointment and dry sterile dressings were applied.   All tape and needle counts were correct at the end of the procedure.  The patient was extubated in the operating room and went back to  recovery room awake in stable condition.   COMPLICATIONS:  None.   SPECIMEN:  Right breast and axillary contents.   BLOOD LOSS:  Less than 100 mL.   DRAINS:  Jackson-Pratt drains to flap and right axilla.      Dalia Heading, M.D.  Electronically Signed     MAJ/MEDQ  D:  01/22/2009  T:  01/22/2009  Job:  [id number]   cc:   Tesfaye D. Felecia Shelling, MD  Fax: [phone]

## 2010-12-13 NOTE — Discharge Summary (Signed)
Lauren, [name] NO.:  [id number]   MEDICAL RECORD NO.:  [id number]          PATIENT TYPE:  INP   LOCATION:  A326                          FACILITY:  [facility]   PHYSICIAN:  Dalia Heading, M.D.  DATE OF BIRTH:  [birth year]   DATE OF ADMISSION:  01/22/2009  DATE OF DISCHARGE:  06/27/2010LH                               DISCHARGE SUMMARY   HOSPITAL COURSE SUMMARY:  The patient is a 70 year old white female with  stage I right breast carcinoma, clinically who presented for a right  modified radical mastectomy.  This was performed on January 22, 2009.  She  tolerated the procedure well.  Postoperative course has been  unremarkable.  Her diet was advanced without difficulty.  Final  pathology is still pending.   The patient is being discharged home on postoperative day #2 in good and  improving condition.   DISCHARGE INSTRUCTIONS:  The patient is to follow up Dr. Franky Macho on  January 28, 2009.  She is to drain and record her bulb suctions twice a day.   DISCHARGE MEDICATIONS:  1. Darvocet-N 100 one tablet p.o. q.4 h. p.r.n. pain.  2. Lovastatin 40 mg p.o. daily.  3. Omeprazole 20 mg p.o. daily.  4. Metoprolol 25 mg p.o. b.i.d.  5. Calcium supplements daily.   PRINCIPAL DIAGNOSES:  1. Right breast carcinoma.  2. Hypertension.  3. High cholesterol levels.   PRINCIPAL PROCEDURE:  Right modified radical mastectomy on January 22, 2009.      Dalia Heading, M.D.  Electronically Signed     MAJ/MEDQ  D:  01/24/2009  T:  01/24/2009  Job:  [id number]   cc:   Tesfaye D. Felecia Shelling, MD  Fax: [phone]

## 2010-12-16 NOTE — H&P (Signed)
[name], [name]                  ACCOUNT NO.:  [id number]   MEDICAL RECORD NO.:  [id number]         PATIENT TYPE:  PAMB   LOCATION:  DAY                           FACILITY:  [facility]   PHYSICIAN:  Dalia Heading, M.D.  DATE OF BIRTH:  [birth year]   DATE OF ADMISSION:  01/22/2009  DATE OF DISCHARGE:  LH                              HISTORY & PHYSICAL   CHIEF COMPLAINT:  Right breast carcinoma.   HISTORY OF PRESENT ILLNESS:  The patient is a 70 year old white female  who is referred for evaluation and treatment of a right breast  carcinoma.  This was found on routine mammography and biopsy-proven to  be invasive ductal carcinoma.  MRI of the left breast was negative.  The  right axilla had no suspicious lymph nodes.  A single lesion was seen in  the right breast.  She does have a sister with breast cancer in the  remote past.  No nipple discharge has been noted.  The mass was not  palpable by the patient.   PAST MEDICAL HISTORY:  Includes hypertension and high cholesterol  levels.   PAST SURGICAL HISTORY:  Appendectomy, hysterectomy, cyst in left breast.   CURRENT MEDICATIONS:  1. Lovastatin 40 mg p.o. daily.  2. Omeprazole 20 mg p.o. daily.  3. Metoprolol 25 mg p.o. daily.  4. Calcium supplements.   ALLERGIES:  NO KNOWN DRUG ALLERGIES.   REVIEW OF SYSTEMS:  The patient does smoke a pack cigarettes a day.  She  denies any alcohol use.  She denies any other cardiopulmonary  difficulties or bleeding disorders.   PHYSICAL EXAMINATION:  GENERAL:  The patient is a well-developed, well-  nourished white female, in no acute distress.  NECK:  Is supple without lymphadenopathy.  LUNGS:  Clear to auscultation with equal breath sounds bilaterally.  HEART:  Examination reveals a regular rate and rhythm without S3, S4, or  murmurs.  BREASTS:  Right breast examination reveals no dominant mass, nipple  discharge, or dimpling.  The axilla is negative for palpable nodes.  The  left breast  examination reveals no dominant mass, nipple discharge, or  dimpling.  The axilla is negative for palpable nodes.  Mammogram and MRI  reports were reviewed.   IMPRESSION:  Right breast carcinoma, clinical stage I.   PLAN:  The patient is scheduled for a right modified radical mastectomy  on January 22, 2009.  The risks and benefits of the procedure, including  bleeding, infection, nerve injury, swelling of the arm and the  possibility of a blood transfusion were fully explained to the patient,  who gave informed consent.      Dalia Heading, M.D.  Electronically Signed     MAJ/MEDQ  D:  01/07/2009  T:  01/07/2009  Job:  [id number]

## 2010-12-16 NOTE — Procedures (Signed)
[name], [name]                  ACCOUNT NO.:  [id number]   MEDICAL RECORD NO.:  [id number]          PATIENT TYPE:  OUT   LOCATION:  RESP                          FACILITY:  [facility]   PHYSICIAN:  Edward L. Juanetta Gosling, M.D.DATE OF BIRTH:  09/02/1940   DATE OF PROCEDURE:  03/22/2007  DATE OF DISCHARGE:  03/20/2007                            PULMONARY FUNCTION TEST   IMPRESSION:  1. Spirometry is normal.  2. Lung volumes are normal.  3. DLCO is minimally reduced.      Edward L. Juanetta Gosling, M.D.  Electronically Signed     ELH/MEDQ  D:  03/22/2007  T:  03/23/2007  Job:  [id number]   cc:   Tesfaye D. Felecia Shelling, MD  Fax: [phone]

## 2010-12-19 ENCOUNTER — Ambulatory Visit (HOSPITAL_COMMUNITY)
Admission: RE | Admit: 2010-12-19 | Discharge: 2010-12-19 | Disposition: A | Discharge status: Home / Self Care | Payer: Medicare Other | Source: Ambulatory Visit | Attending: Oncology | Admitting: Oncology

## 2010-12-19 DIAGNOSIS — Z139 Encounter for screening, unspecified: Secondary | ICD-10-CM

## 2010-12-19 DIAGNOSIS — Z9011 Acquired absence of right breast and nipple: Secondary | ICD-10-CM

## 2010-12-19 DIAGNOSIS — Z1231 Encounter for screening mammogram for malignant neoplasm of breast: Secondary | ICD-10-CM | POA: Insufficient documentation

## 2011-01-10 ENCOUNTER — Encounter (HOSPITAL_COMMUNITY): Payer: Self-pay | Admitting: *Deleted

## 2011-01-10 DIAGNOSIS — M779 Enthesopathy, unspecified: Secondary | ICD-10-CM | POA: Insufficient documentation

## 2011-01-16 ENCOUNTER — Encounter (HOSPITAL_COMMUNITY): Payer: Self-pay | Admitting: Oncology

## 2011-01-16 ENCOUNTER — Other Ambulatory Visit (HOSPITAL_COMMUNITY): Payer: Self-pay | Admitting: Oncology

## 2011-01-16 DIAGNOSIS — C50919 Malignant neoplasm of unspecified site of unspecified female breast: Secondary | ICD-10-CM

## 2011-01-16 HISTORY — DX: Malignant neoplasm of unspecified site of unspecified female breast: C50.919

## 2011-02-06 ENCOUNTER — Encounter (HOSPITAL_COMMUNITY): Payer: Self-pay | Admitting: *Deleted

## 2011-02-07 ENCOUNTER — Encounter (HOSPITAL_COMMUNITY): Payer: Self-pay | Admitting: Oncology

## 2011-02-07 ENCOUNTER — Encounter (HOSPITAL_COMMUNITY): Payer: Medicare Other | Attending: Oncology | Admitting: Oncology

## 2011-02-07 VITALS — BP 164/81 | HR 76 | Temp 98.2°F | Wt 138.4 lb

## 2011-02-07 DIAGNOSIS — C50919 Malignant neoplasm of unspecified site of unspecified female breast: Secondary | ICD-10-CM

## 2011-02-07 DIAGNOSIS — Z170 Estrogen receptor positive status [ER+]: Secondary | ICD-10-CM

## 2011-02-07 DIAGNOSIS — I749 Embolism and thrombosis of unspecified artery: Secondary | ICD-10-CM

## 2011-02-07 DIAGNOSIS — R0989 Other specified symptoms and signs involving the circulatory and respiratory systems: Secondary | ICD-10-CM

## 2011-02-07 DIAGNOSIS — R0689 Other abnormalities of breathing: Secondary | ICD-10-CM

## 2011-02-07 DIAGNOSIS — M779 Enthesopathy, unspecified: Secondary | ICD-10-CM

## 2011-02-07 DIAGNOSIS — J449 Chronic obstructive pulmonary disease, unspecified: Secondary | ICD-10-CM

## 2011-02-07 NOTE — Progress Notes (Signed)
Addended byMariel Sleet MD, Rosan Calbert S on: 02/07/2011 12:12 PM   Modules accepted: Orders

## 2011-02-07 NOTE — Progress Notes (Signed)
CC:   [name], M.D. Lauren D. Felecia Shelling, MD  DIAGNOSIS: 1. Stage IB (T1b N0 M0) grade 1 infiltrating ductal carcinoma of the     right breast status post right modified radical mastectomy on     01/22/2009 for a 10-mm cancer that was ER positive 100%, PR     positive 98%, Ki-67 marker was 9%, HER-2/neu was negative.  No LVI     was seen and 11 nodes were all negative.  Margins were clear.  She     had some associated DCIS and she is on Arimidex which she will take     for 5 years. 2. Chronic obstructive pulmonary disease with abnormal breath sounds     with rales and rhonchi at both bases which do not clear with     coughing.  She started smoking when she was 15 and I think she     should have a CT of the chest to make sure we are not missing     subtle lung cancer. 3. IBS. 4. Gastroesophageal reflux disease. 5. Left extensor thumb tendon tendonitis that is quite significant and     we will get a referral to Dr. Laroy Apple. 6. Hypercholesterolemia. 7. Hypertension. 8. Appendectomy in 1974 at which time she had a pulmonary embolus. 9. Osteopenia/osteoporosis on calcium and vitamin D and she needs a     bone density this September and we will schedule that for September     10.  FAMILY HISTORY:  Breast cancer with maternal grandmother dying of breast cancer at age 54, sister at 59 treated for breast cancer and that sister is now 73.  She also has a daughter 97 now who has stage I colon cancer.  [name]'s vital signs were all stable.  She has no complaints related to her breast cancer.  No nodularity.  Her left extensor tendon of the thumb is very tender for about 4-5 inches.  She tells me that Dr. Romeo Apple actually injected her with some steroids in January.  He got better for about 3 months and now it is just as bad if not worse.  She did not go back and follow up and she was not sure if she was told to come back but I suspect she was.  I think this is more relevant to a hand  orthopedic surgeon's domain so I will send her to Dr. Laroy Apple and we will set that appointment up. She states the pain is really quite severe.  She cannot cook with her left hand.  She cannot pick up a pot.  She cannot turn the handle of a door well.  She has slight area of warmth that is about 3 or 4 inches long.  She has the inability to extend the thumb.  She can flex it but it still hurts.  She has no obvious atrophy at the thenar, hypothenar muscles.  The rest of her review of systems is noncontributory.  She is still smoking half pack of cigarettes a day starting smoking when she was 15.  PHYSICAL EXAMINATION:  Vital signs:  She is afebrile today, respirations 18 and unlabored.  Pulse 76 and regular, blood pressure 164/81.  Her weight is 138 pounds which is up about 2 pounds, but in the same range as always.  Her lymph nodes are negative throughout.  Lungs:  Show rales and rhonchi at both bases which do not clear.  She has diminished breath sounds and hyperresonance to percussion.  Heart:  Shows a regular rhythm and rate without murmur, rub or gallop.  The right chest wall was clear. The left breast is negative for any masses.  Abdomen:  Decreased bowel sounds, but no hepatosplenomegaly.  No masses.  No distension.  She has no peripheral edema.  We will see her in 6 months.  We will schedule a bone density since she needs one for September 2012.  We will get an appointment with Dr. Teressa Senter and I think with her abnormal breath sounds we are going to schedule a CT of the chest.    ______________________________ Ladona Horns. Mariel Sleet, MD ESN/MEDQ  D:  02/07/2011  T:  02/07/2011  Job:  [id number]

## 2011-02-07 NOTE — Patient Instructions (Signed)
[facility]  Discharge Instructions  RECOMMENDATIONS MADE BY THE CONSULTANT AND ANY TEST RESULTS WILL BE SENT TO YOUR REFERRING DOCTOR.   EXAM FINDINGS BY MD TODAY AND SIGNS AND SYMPTOMS TO REPORT TO CLINIC OR PRIMARY MD: Need to have Bone Density in September 2012, CT of Chest in August and referral to Dr. Teressa Senter in August    SPECIAL INSTRUCTIONS/FOLLOW-UP: Xray Studies Needed CT Scan in August, Referral to [facility] in August, Bone Density in September 2012, Return to Clinic in 6 months to see MD.   I acknowledge that I have been informed and understand all the instructions given to me and received a copy. I do not have any more questions at this time, but understand that I may call the Specialty Clinic at [facility] at [phone] during business hours should I have any further questions or need assistance in obtaining follow-up care.    __________________________________________  _____________  __________ Signature of Patient or Authorized Representative            Date                   Time    __________________________________________ Nurse's Signature

## 2011-02-07 NOTE — Progress Notes (Signed)
This office note has been dictated.

## 2011-02-10 ENCOUNTER — Telehealth (HOSPITAL_COMMUNITY): Payer: Self-pay

## 2011-02-10 NOTE — Telephone Encounter (Signed)
Appointment made with Dr. Teressa Senter for 03/06/11 at 11 am.  Patient to arrive at 10:30am.  Office note and demographics faxed to Dr. Stark Jock office.  Patient will need to call Tammy at Dr. Stark Jock office at [phone] for instructions prior to appointment.

## 2011-03-13 ENCOUNTER — Ambulatory Visit (HOSPITAL_COMMUNITY)
Admission: RE | Admit: 2011-03-13 | Discharge: 2011-03-13 | Disposition: A | Discharge status: Home / Self Care | Payer: Medicare Other | Source: Ambulatory Visit | Attending: Oncology | Admitting: Oncology

## 2011-03-13 ENCOUNTER — Encounter (HOSPITAL_COMMUNITY): Payer: Self-pay

## 2011-03-13 DIAGNOSIS — R0602 Shortness of breath: Secondary | ICD-10-CM | POA: Insufficient documentation

## 2011-03-13 DIAGNOSIS — C50919 Malignant neoplasm of unspecified site of unspecified female breast: Secondary | ICD-10-CM | POA: Insufficient documentation

## 2011-03-13 DIAGNOSIS — R0689 Other abnormalities of breathing: Secondary | ICD-10-CM

## 2011-03-13 DIAGNOSIS — R918 Other nonspecific abnormal finding of lung field: Secondary | ICD-10-CM | POA: Insufficient documentation

## 2011-03-17 ENCOUNTER — Telehealth (HOSPITAL_COMMUNITY): Payer: Self-pay | Admitting: *Deleted

## 2011-03-17 NOTE — Telephone Encounter (Signed)
Patient instructed that she needs to see Dr. Juanetta Gosling regarding her chronic interstitial pneumonia. She verbalized understanding. I am calling to get patient a referral

## 2011-03-17 NOTE — Telephone Encounter (Signed)
Patient scheduled to see Dr. Juanetta Gosling March 27, 2011 @ 10:30.

## 2011-03-30 ENCOUNTER — Ambulatory Visit (HOSPITAL_BASED_OUTPATIENT_CLINIC_OR_DEPARTMENT_OTHER)
Admission: RE | Admit: 2011-03-30 | Discharge: 2011-03-30 | Disposition: A | Discharge status: Home / Self Care | Payer: Medicare Other | Source: Ambulatory Visit | Attending: Orthopedic Surgery | Admitting: Orthopedic Surgery

## 2011-03-30 DIAGNOSIS — Z01812 Encounter for preprocedural laboratory examination: Secondary | ICD-10-CM | POA: Insufficient documentation

## 2011-03-30 DIAGNOSIS — M65839 Other synovitis and tenosynovitis, unspecified forearm: Secondary | ICD-10-CM | POA: Insufficient documentation

## 2011-03-30 LAB — POCT HEMOGLOBIN-HEMACUE: Hemoglobin: 13.1 g/dL (ref 12.0–15.0)

## 2011-03-31 NOTE — Op Note (Signed)
  NAMEANNETTE, [name]                  ACCOUNT NO.:  [id number]  MEDICAL RECORD NO.:  [id number]  LOCATION:                                 FACILITY:  PHYSICIAN:  Katy Fitch. Sypher, M.D. DATE OF BIRTH:  11-28-1940  DATE OF PROCEDURE:  03/30/2011 DATE OF DISCHARGE:                              OPERATIVE REPORT   PREOPERATIVE DIAGNOSIS:  Severe stenosing tenosynovitis, left first dorsal compartment, with aromatase inhibitor medication.  POSTOPERATIVE DIAGNOSIS:  Severe stenosing tenosynovitis, left first dorsal compartment, with aromatase inhibitor medication.  OPERATIONS:  Release of left first dorsal compartment with excision of septum between extensor pollicis brevis and abductor pollicis longus tendons.  OPERATING SURGEON:  Katy Fitch. Sypher, MD  ASSISTANT:  Annye Rusk, PA-C  ANESTHESIA:  2% Lidocaine supplemented by IV sedation.  SUPERVISING ANESTHESIOLOGIST:  Dr. Sampson Goon.  ANESTHETIST:  Annye Rusk, PA-C  INDICATIONS:  [name] is a 70 year old woman referred through the courtesy of Dr. Glenford Peers for evaluation and management of the painful left first dorsal compartment.  She had seen Dr. Romeo Apple in [place] and had steroid injection without relief.  She now presents for evaluation and management.  PROCEDURE:  [name] was brought to room 2 of the [facility] and placed in supine position on the operating table.  Following placement of an IV, IV sedation was provided by our nurse, anesthetist colleague.  After Betadine prep, 2% lidocaine was infiltrated into the path intended incision and into the first dorsal compartment.  When anesthesia satisfactory, the arm was prepped with Betadine soap solution, sterilely draped.  The arm was exsanguinated with an Esmarch bandage.  An arterial tourniquet proximal brachium inflated to 260 mmHg.  Procedure commenced with routine surgical time- out.  Transverse incision was fashioned directly over the  first dorsal compartment.  Subcutaneous tissues were carefully divided revealing a very thickened compartment wall.  After soft tissues were cleared including the radial superficial sensory branches were gently identified and retracted, the compartment was split with scalpel and scissors. There were two slips of the abductor pollicis longus and single slip of the extensor pollicis brevis.  There was a thick septum between the extensor pollicis brevis and the abductor pollicis longus tendons that was incised and resected with a rongeur.  Thereafter, free range of motion wrist was recovered.  The wound was checked for bleeding points followed by repair of the skin with intradermal 3-0 Prolene and Steri-Strips.  Compression dressing applied with Steri-Strips, sterile gauze and Ace wrap.  No apparent complications.  For aftercare, Ms. Manzi was provided prescription for Vicodin 5 mg one p.o. q.4-6 hours p.r.n. pain 20 tablets without refill.     Katy Fitch Sypher, M.D.     RVS/MEDQ  D:  03/30/2011  T:  03/30/2011  Job:  [id number]  cc:   Ladona Horns. Mariel Sleet, MD  Electronically Signed by Josephine Igo M.D. on 03/31/2011 09:21:05 AM

## 2011-04-04 ENCOUNTER — Ambulatory Visit (HOSPITAL_COMMUNITY)
Admission: RE | Admit: 2011-04-04 | Discharge: 2011-04-04 | Disposition: A | Discharge status: Home / Self Care | Payer: Medicare Other | Source: Ambulatory Visit | Attending: Pulmonary Disease | Admitting: Pulmonary Disease

## 2011-04-04 DIAGNOSIS — R0602 Shortness of breath: Secondary | ICD-10-CM | POA: Insufficient documentation

## 2011-04-04 LAB — BLOOD GAS, ARTERIAL
Acid-Base Excess: 1.7 mmol/L (ref 0.0–2.0)
FIO2: 0.21 %
O2 Saturation: 97.5 %
Patient temperature: 37

## 2011-04-04 MED ORDER — ALBUTEROL SULFATE (5 MG/ML) 0.5% IN NEBU
2.5000 mg | INHALATION_SOLUTION | Freq: Once | RESPIRATORY_TRACT | Status: AC
  Administered 2011-04-04: 2.5 mg via RESPIRATORY_TRACT

## 2011-04-10 ENCOUNTER — Ambulatory Visit (HOSPITAL_COMMUNITY)
Admission: RE | Admit: 2011-04-10 | Discharge: 2011-04-10 | Disposition: A | Discharge status: Home / Self Care | Payer: Medicare Other | Source: Ambulatory Visit | Attending: Oncology | Admitting: Oncology

## 2011-04-10 DIAGNOSIS — I749 Embolism and thrombosis of unspecified artery: Secondary | ICD-10-CM

## 2011-04-10 DIAGNOSIS — Z780 Asymptomatic menopausal state: Secondary | ICD-10-CM | POA: Insufficient documentation

## 2011-04-10 DIAGNOSIS — M949 Disorder of cartilage, unspecified: Secondary | ICD-10-CM | POA: Insufficient documentation

## 2011-04-10 DIAGNOSIS — M899 Disorder of bone, unspecified: Secondary | ICD-10-CM | POA: Insufficient documentation

## 2011-04-10 NOTE — Procedures (Signed)
NAMESHAKETA, SERAFIN                  ACCOUNT NO.:  [id number]  MEDICAL RECORD NO.:  [id number]  LOCATION:                                 FACILITY:  PHYSICIAN:  Sloan Galentine L. Juanetta Gosling, M.D.DATE OF BIRTH:  September 01, 1940  DATE OF PROCEDURE: DATE OF DISCHARGE:                           PULMONARY FUNCTION TEST   REASON FOR PULMONARY FUNCTION TESTING:  Shortness of breath.  1. Spirometry shows no definite ventilatory defect. 2. Lung volumes show mild reduction in total lung capacity suggesting     restrictive change. 3. DLCO is mildly reduced. 4. Arterial blood gases are normal. 5. There is no significant bronchodilator improvement.     Sylvan Lahm L. Juanetta Gosling, M.D.     ELH/MEDQ  D:  04/10/2011  T:  04/10/2011  Job:  [id number]

## 2011-04-18 ENCOUNTER — Telehealth (HOSPITAL_COMMUNITY): Payer: Self-pay | Admitting: *Deleted

## 2011-04-18 NOTE — Telephone Encounter (Signed)
Pt called regarding her bone density test. No answer. Dr. Thornton Papas question is whether or not the patient is on a Rx for her bones.

## 2011-04-25 LAB — CBC
HCT: 30.7 — ABNORMAL LOW
HCT: 33.4 — ABNORMAL LOW
Hemoglobin: 11.1 — ABNORMAL LOW
Hemoglobin: 12.1
MCHC: 35.9
MCHC: 36.1 — ABNORMAL HIGH
MCV: 91.3
MCV: 91.6
MCV: 91.7
Platelets: 119 — ABNORMAL LOW
Platelets: 122 — ABNORMAL LOW
RBC: 3.65 — ABNORMAL LOW
RDW: 12.4
RDW: 12.7
RDW: 12.8
WBC: 5.8

## 2011-04-25 LAB — BASIC METABOLIC PANEL WITH GFR
BUN: 7
CO2: 28
Calcium: 8.6
Chloride: 109
Creatinine, Ser: 0.81
GFR calc non Af Amer: >60
Glucose, Bld: 117 — ABNORMAL HIGH
Potassium: 3.8
Sodium: 140

## 2011-04-25 LAB — DIFFERENTIAL
Basophils Absolute: 0
Basophils Relative: 1
Basophils Relative: 1
Eosinophils Absolute: 0.1
Eosinophils Absolute: 0.1
Eosinophils Absolute: 0.1
Eosinophils Relative: 2
Eosinophils Relative: 2
Eosinophils Relative: 3
Lymphocytes Relative: 27
Lymphocytes Relative: 31
Lymphs Abs: 1.7
Lymphs Abs: 2.1
Monocytes Absolute: 0.6
Monocytes Absolute: 0.6
Monocytes Relative: 10
Monocytes Relative: 7
Neutro Abs: 3.1
Neutro Abs: 3.5
Neutrophils Relative %: 61
Neutrophils Relative %: 64

## 2011-04-25 LAB — COMPREHENSIVE METABOLIC PANEL
ALT: 12
AST: 19
Calcium: 8.9
Creatinine, Ser: 0.79
GFR calc Af Amer: >60
GFR calc non Af Amer: >60
Sodium: 137
Total Protein: 6.3

## 2011-04-25 LAB — PROTIME-INR
INR: 1
Prothrombin Time: 13.3

## 2011-05-12 LAB — BLOOD GAS, ARTERIAL
Bicarbonate: 25.3 — ABNORMAL HIGH
FIO2: 21
Patient temperature: 37
pH, Arterial: 7.416 — ABNORMAL HIGH

## 2011-05-16 NOTE — Telephone Encounter (Signed)
Just trying to close this encounter. 

## 2011-05-19 ENCOUNTER — Encounter (HOSPITAL_COMMUNITY): Payer: Self-pay | Admitting: Pulmonary Disease

## 2011-08-14 ENCOUNTER — Encounter (HOSPITAL_COMMUNITY): Payer: Medicare Other | Attending: Oncology | Admitting: Oncology

## 2011-08-14 VITALS — BP 164/68 | HR 63 | Temp 97.9°F | Wt 135.8 lb

## 2011-08-14 DIAGNOSIS — Z803 Family history of malignant neoplasm of breast: Secondary | ICD-10-CM

## 2011-08-14 DIAGNOSIS — Z170 Estrogen receptor positive status [ER+]: Secondary | ICD-10-CM

## 2011-08-14 DIAGNOSIS — M810 Age-related osteoporosis without current pathological fracture: Secondary | ICD-10-CM

## 2011-08-14 DIAGNOSIS — C50119 Malignant neoplasm of central portion of unspecified female breast: Secondary | ICD-10-CM

## 2011-08-14 DIAGNOSIS — C50919 Malignant neoplasm of unspecified site of unspecified female breast: Secondary | ICD-10-CM

## 2011-08-14 NOTE — Progress Notes (Signed)
This office note has been dictated.

## 2011-08-14 NOTE — Progress Notes (Signed)
CC:   Tesfaye D. Felecia Shelling, MD Dalia Heading, M.D. Edward L. Juanetta Gosling, M.D.  DIAGNOSES: 1. Stage IB (T1b N0 M0), grade 1 infiltrating ductal carcinoma of the     right breast, status post right modified radical mastectomy on     01/22/2009 for a 10 mm cancer, ER positive 100%, PR positive 98%,     Ki-67 marker low at 9%, HER-2/neu negative.  No lymphovascular     invasion was seen.  Eleven lymph nodes were negative.  All margins     were clear.  She has some associated ductal carcinoma in situ.  We     placed her on Arimidex, which she will take for 5 years, and she is     tolerating it quite well. 2. Chronic obstructive pulmonary disease. 3. Possible interstitial pneumonitis.  She has seen Dr. Juanetta Gosling and a     doctor at [facility], she states. 4. Irritable bowel syndrome. 5. Gastroesophageal reflux disease. 6. Left thumb tendonitis, operated on by Dr. Laroy Apple with nice     results. 7. History of hypercholesterolemia. 8. Hysterectomy for benign disease in 1987. 9. Appendectomy in 1974 at which time she had a pulmonary embolus. 10.Tubal ligation in 1972. 11.Left breast cyst removal in 1968. 12.Sinus surgery in 2002. 13.Diverticulitis in the past, treated by Dr. Darrick Penna. 14.Osteoporosis, on therapy and she had a very stable bone density     recently. 15.Tear duct operation by an ophthalmologist in [place] years ago for     an obstructed duct. 16.Family history of breast cancer with a maternal grandmother dying     of breast cancer at age 49, a sister at 59 treated for breast     cancer but survived it and she is now 13, and daughter at 57     diagnosed with stage I colon cancer.  INTERVAL HISTORY:  [name] is feeling better.  She really does not admit to shortness of breath.  She gets tired after she walks or works.  Her vital signs are otherwise stable.  She is not coughing up any junk, but she did have what looked like interstitial pneumonitis on a CT scan after I found rales on her  in both lung bases that sounded significant. She was referred to a doctor at [facility], she states, and is going back there in early March.  Since I have seen her, otherwise, she states she had a lot of blood work at [facility] and I will not do any today, realistically.  She had some blood gases on 04/04/2011, which showed a pO2 of 87, pCO2 of 34, pH of 7.47.  PHYSICAL EXAMINATION:  General Appearance:  She looks very good today. Vital Signs:  As I mentioned, are remarkably stable.  Her weight is 135 pounds, blood pressure 164/68 in the left arm sitting position, pulse 60 and regular, respirations are 16-18 and unlabored.  She is afebrile. She denies any pain.  Lungs:  A few rales at the bases, but they are less than I remember.  Otherwise, she is clear.  Chest:  Her right chest wall is clear.  Breasts:  The left breast is negative for any masses. Heart:  Regular rhythm and rate without murmur, rub, or gallop at this time.  Abdomen:  Soft and nontender.  She has no organomegaly.  No splenomegaly.  Extremities:  No peripheral edema in the of the arms or legs.  PLAN:  We will see her back in 6 more months.  We will continue  the Arimidex.  We will see what the workup at [facility] shows.    ______________________________ Ladona Horns. Mariel Sleet, MD ESN/MEDQ  D:  08/14/2011  T:  08/14/2011  Job:  [id number]

## 2011-08-14 NOTE — Patient Instructions (Signed)
[name]  [id number] September 29, 1940   [facility]  Discharge Instructions  RECOMMENDATIONS MADE BY THE CONSULTANT AND ANY TEST RESULTS WILL BE SENT TO YOUR REFERRING DOCTOR.   EXAM FINDINGS BY MD TODAY AND SIGNS AND SYMPTOMS TO REPORT TO CLINIC OR PRIMARY MD: you are doing well per MD.  No evidence of recurrence by exam.  MEDICATIONS PRESCRIBED: None   INSTRUCTIONS GIVEN AND DISCUSSED: Other :  Report any new lumps, bone pain or shortness of breath  SPECIAL INSTRUCTIONS/FOLLOW-UP: Return to Clinic in 6 months.   I acknowledge that I have been informed and understand all the instructions given to me and received a copy. I do not have any more questions at this time, but understand that I may call the Specialty Clinic at [facility] at [phone] during business hours should I have any further questions or need assistance in obtaining follow-up care.    __________________________________________  _____________  __________ Signature of Patient or Authorized Representative            Date                   Time    __________________________________________ Nurse's Signature

## 2011-09-13 DIAGNOSIS — J849 Interstitial pulmonary disease, unspecified: Secondary | ICD-10-CM | POA: Insufficient documentation

## 2011-11-21 ENCOUNTER — Other Ambulatory Visit (HOSPITAL_COMMUNITY): Payer: Self-pay | Admitting: Internal Medicine

## 2011-11-21 DIAGNOSIS — Z139 Encounter for screening, unspecified: Secondary | ICD-10-CM

## 2011-11-27 ENCOUNTER — Other Ambulatory Visit (HOSPITAL_COMMUNITY): Payer: Self-pay | Admitting: Oncology

## 2011-11-27 DIAGNOSIS — K219 Gastro-esophageal reflux disease without esophagitis: Secondary | ICD-10-CM

## 2011-11-27 MED ORDER — ESOMEPRAZOLE MAGNESIUM 40 MG PO CPDR: 40.0000 mg | DELAYED_RELEASE_CAPSULE | Freq: Every day | ORAL | Status: DC

## 2011-12-29 ENCOUNTER — Encounter (HOSPITAL_COMMUNITY): Payer: Self-pay | Admitting: Oncology

## 2011-12-29 ENCOUNTER — Other Ambulatory Visit (HOSPITAL_COMMUNITY): Payer: Self-pay | Admitting: Oncology

## 2011-12-29 DIAGNOSIS — C50919 Malignant neoplasm of unspecified site of unspecified female breast: Secondary | ICD-10-CM

## 2011-12-29 MED ORDER — ANASTROZOLE 1 MG PO TABS: 1.0000 mg | ORAL_TABLET | Freq: Every day | ORAL | Status: DC

## 2012-01-01 ENCOUNTER — Ambulatory Visit (HOSPITAL_COMMUNITY)
Admission: RE | Admit: 2012-01-01 | Discharge: 2012-01-01 | Disposition: A | Discharge status: Home / Self Care | Payer: Medicare Other | Source: Ambulatory Visit | Attending: Internal Medicine | Admitting: Internal Medicine

## 2012-01-01 DIAGNOSIS — Z1231 Encounter for screening mammogram for malignant neoplasm of breast: Secondary | ICD-10-CM | POA: Insufficient documentation

## 2012-01-01 DIAGNOSIS — Z139 Encounter for screening, unspecified: Secondary | ICD-10-CM

## 2012-02-13 ENCOUNTER — Encounter (HOSPITAL_COMMUNITY): Payer: Medicare Other | Attending: Oncology | Admitting: Oncology

## 2012-02-13 VITALS — BP 162/73 | HR 72 | Temp 98.2°F | Wt 132.6 lb

## 2012-02-13 DIAGNOSIS — C50919 Malignant neoplasm of unspecified site of unspecified female breast: Secondary | ICD-10-CM

## 2012-02-13 DIAGNOSIS — Z170 Estrogen receptor positive status [ER+]: Secondary | ICD-10-CM

## 2012-02-13 DIAGNOSIS — Z86711 Personal history of pulmonary embolism: Secondary | ICD-10-CM

## 2012-02-13 DIAGNOSIS — J449 Chronic obstructive pulmonary disease, unspecified: Secondary | ICD-10-CM

## 2012-02-13 NOTE — Patient Instructions (Signed)
[facility]  Discharge Instructions  RECOMMENDATIONS MADE BY THE CONSULTANT AND ANY TEST RESULTS WILL BE SENT TO YOUR REFERRING DOCTOR.   PLEASE QUIT SMOKING. We will schedule you for a CT Scan late August. You need a CT Scan of your chest once a year. If you have a CT Scan at [facility], call us and we will cancel the CT Scan here. Continue your medicines as prescribed.  Return to see MD in 6 months.   I acknowledge that I have been informed and understand all the instructions given to me and received a copy. I do not have any more questions at this time, but understand that I may call the Specialty Clinic at [facility] at [phone] during business hours should I have any further questions or need assistance in obtaining follow-up care.    __________________________________________  _____________  __________ Signature of Patient or Authorized Representative            Date                   Time    __________________________________________ Nurse's Signature

## 2012-02-13 NOTE — Progress Notes (Signed)
Problem #1 stage IB (T1 B. N0 M0) grade 1 infiltrating ductal carcinoma the right breast status post right modified radical mastectomy with 11 negative nodes on 01/22/2009 for a 10 mm cancer ER +100% PR +98% Ki-67 marker low of 9% HER-2/neu negative no LV I was seen, margins were clear she had mild associated DCIS and she has been on Arimidex which will take for 5 years.  Problem #2 COPD with interstitial pneumonitis seen Dr. Juanetta Gosling as well as a doctor at [facility]  Problem #3 history of pulmonary embolus after an appendectomy 1974.  Her other diagnoses are in my note as of 08/14/2011. She had blood work she states by Dr. Felecia Shelling than last 6 months. She is doing well at this time. Unfortunately she is still smoking half a pack of cigarettes a day. She started smoking when she was 15 worked her way up to a pack a day for greater than 30 years. She is down to a half pack a day and I've asked her once again to quit smoking. She states that might be very hard for her to do. She denies any coughing up of blood no fevers chills and vital signs are stable with a weight reduction essentially. She is working out in her garden. She is very can in some exposed areas.  She does of course have a daughter who is 48 when she was diagnosed with colon cancer still living and doing well. Her one sister was 61 when she was diagnosed with breast cancer but is now 19 soon to be 96 it sounds like and then a maternal grandmother had breast cancer and died from it at age 60. It may be that her daughter should consider genetic testing. Leita could also consider it but doesn't really have an interest presently.  Her physical exam shows stable vital signs. Lymph nodes are negative throughout. The right chest wall is clear. The left breast is negative for masses. Her lungs show rales at both bases which do not clear with coughing. She has diminished breath sounds throughout and hyperresonance to percussion. Her heart shows a regular rhythm  and rate with distant heart sounds no S3 gallop or murmur. I did not detect or rub. Her abdomen was soft and nontender without again in [place] or masses. Bowel sounds are normal. Should no arm or leg edema.  She will continue the Arimidex. She is due to see the pulmonologist at [facility] in mid-August. If he does not do a CAT scan she should have another one this year and another 1 next year based upon the national long trialists group results of CAT scans versus chest x-ray screening for smokers a greater than 30 pack years. We will see her in 6 months sooner if need be

## 2012-03-25 ENCOUNTER — Ambulatory Visit (HOSPITAL_COMMUNITY): Payer: Medicare Other

## 2012-05-28 ENCOUNTER — Other Ambulatory Visit (HOSPITAL_COMMUNITY): Payer: Self-pay | Admitting: Oncology

## 2012-05-28 DIAGNOSIS — K219 Gastro-esophageal reflux disease without esophagitis: Secondary | ICD-10-CM

## 2012-05-28 MED ORDER — ESOMEPRAZOLE MAGNESIUM 40 MG PO CPDR: 40.0000 mg | DELAYED_RELEASE_CAPSULE | Freq: Every day | ORAL | Status: DC

## 2012-07-01 ENCOUNTER — Other Ambulatory Visit (HOSPITAL_COMMUNITY): Payer: Self-pay | Admitting: Oncology

## 2012-07-01 DIAGNOSIS — C50919 Malignant neoplasm of unspecified site of unspecified female breast: Secondary | ICD-10-CM

## 2012-07-01 MED ORDER — ANASTROZOLE 1 MG PO TABS: 1.0000 mg | ORAL_TABLET | Freq: Every day | ORAL | Status: DC

## 2012-07-26 ENCOUNTER — Other Ambulatory Visit (HOSPITAL_COMMUNITY): Payer: Self-pay | Admitting: Oncology

## 2012-07-26 ENCOUNTER — Telehealth (HOSPITAL_COMMUNITY): Payer: Self-pay | Admitting: *Deleted

## 2012-07-26 NOTE — Telephone Encounter (Signed)
She should have 5 refills left

## 2012-07-26 NOTE — Telephone Encounter (Signed)
Message left on patient's voicemail that she should have 5 refills left on her prescription.  To call back if any questions.

## 2012-08-16 ENCOUNTER — Encounter (HOSPITAL_COMMUNITY): Payer: Medicare Other | Attending: Oncology | Admitting: Oncology

## 2012-08-16 ENCOUNTER — Encounter (HOSPITAL_COMMUNITY): Payer: Self-pay | Admitting: Oncology

## 2012-08-16 VITALS — BP 166/75 | HR 71 | Temp 98.2°F | Resp 18 | Wt 132.8 lb

## 2012-08-16 DIAGNOSIS — J449 Chronic obstructive pulmonary disease, unspecified: Secondary | ICD-10-CM

## 2012-08-16 DIAGNOSIS — F17200 Nicotine dependence, unspecified, uncomplicated: Secondary | ICD-10-CM

## 2012-08-16 DIAGNOSIS — Z9010 Acquired absence of unspecified breast and nipple: Secondary | ICD-10-CM

## 2012-08-16 DIAGNOSIS — F411 Generalized anxiety disorder: Secondary | ICD-10-CM

## 2012-08-16 DIAGNOSIS — C50919 Malignant neoplasm of unspecified site of unspecified female breast: Secondary | ICD-10-CM

## 2012-08-16 MED ORDER — ALPRAZOLAM 0.5 MG PO TABS: ORAL_TABLET | ORAL | Status: DC

## 2012-08-16 MED ORDER — ALPRAZOLAM 0.5 MG PO TABS: 0.5000 mg | ORAL_TABLET | Freq: Three times a day (TID) | ORAL | Status: DC | PRN

## 2012-08-16 NOTE — Patient Instructions (Addendum)
[facility] Discharge Instructions  RECOMMENDATIONS MADE BY THE CONSULTANT AND ANY TEST RESULTS WILL BE SENT TO YOUR REFERRING PHYSICIAN.  EXAM FINDINGS BY THE PHYSICIAN TODAY AND SIGNS OR SYMPTOMS TO REPORT TO CLINIC OR PRIMARY PHYSICIAN: Exam and discussion by MD.  Bonita Quin need to stop smoking and if you do smoke, don't smoke where young children are.  Need to do CT scan of your chest.  I'll call you with the date and time of the test.  MEDICATIONS PRESCRIBED: Xanax 0.5 mg take 1/2 tablet three times daily as needed for anxiety.  INSTRUCTIONS GIVEN AND DISCUSSED: Report any new lumps, bone pain or shortness of breath  SPECIAL INSTRUCTIONS/FOLLOW-UP: Follow-up with PA in 6 months.  Thank you for choosing [facility] to provide your oncology and hematology care.  To afford each patient quality time with our providers, please arrive at least 15 minutes before your scheduled appointment time.  With your help, our goal is to use those 15 minutes to complete the necessary work-up to ensure our physicians have the information they need to help with your evaluation and healthcare recommendations.    Effective January 1st, 2014, we ask that you re-schedule your appointment with our physicians should you arrive 10 or more minutes late for your appointment.  We strive to give you quality time with our providers, and arriving late affects you and other patients whose appointments are after yours.    Again, thank you for choosing [facility].  Our hope is that these requests will decrease the amount of time that you wait before being seen by our physicians.       _____________________________________________________________  Should you have questions after your visit to [facility], please contact our office at [phone] between the hours of 8:30 a.m. and 5:00 p.m.  Voicemails left after 4:30 p.m. will not be returned until the following  business day.  For prescription refill requests, have your pharmacy contact our office with your prescription refill request.

## 2012-08-16 NOTE — Progress Notes (Signed)
Problem #1 stage IB (T1 B., N0, M0) grade 1 infiltrating ductal carcinoma the right breast status post right modified radical mastectomy 11 negative nodes on 01/22/2009 for 10 mm cancer. Your receptors 100%, PR receptors 98%, Ki-67 marker low at 9% and HER-2/neu was not amplified. Margins were clear. She did have associated DCIS and was placed on Arimidex which she will take for 5 years. Problem #2 COPD still smoking about 1 pack per day she needs a scheduled CT scan this month. Problem #3 history of a pulmonary embolus after an appendectomy 1974 Problem #4 left ear discomfort with no obvious visible abnormality Problem #5 anxiety, she is taking care of her 43 year old son who was hit by a train, had brain damage, no longer works, no longer drives, and becomes very difficult to manage from a behavioral standpoint at times. She is due to take him to Dr. Felecia Shelling in the near future and she needs to discuss this issue with him in detail since he sounds like he needs a behavioral health referral. She would like something for her nerves in the interim and we will prescribe the alprazolam 0.5 mg, one half or one whole tablet 3 times a day when necessary. No refills were given.  She is otherwise doing well and review of systems. She was told to get rid of her Gaynelle Cage because she was felt to have possible psittacosis it sounds like her her physician at [facility]. She is much improved now that she is rid of the [place]. Unfortunately she is still smoking.  BP 166/75  Pulse 71  Temp 98.2 F (36.8 C) (Oral)  Resp 18  Wt 132 lb 12.8 oz (60.238 kg)  She is in no acute distress. Lungs are still hyperresonant to percussion. They are clear however. She has no lymphadenopathy. She does occasionally have discomfort in the right axillary area when she is lifting heavy objects or cleaning windows but this is not new or different. She is not aware of any lumps and there are no lumps to feel on exam of the right chest wall.  There is no adenopathy in any location. She has no arm edema. Heart shows a regular rhythm and rate without murmur rub or gallop. The left breast is negative for masses. Abdomen is soft and nontender without hepatosplenomegaly. She has no leg edema. Both ears are normal canals and normal TMs. If her discomfort continue she needs to see an ENT physician and hopefully will call us or Dr. Felecia Shelling if it continues We will schedule her CAT scan. She had blood work she states at her primary care physician's office 2 months ago. We will retrieve that hopefully.

## 2012-08-21 ENCOUNTER — Ambulatory Visit (HOSPITAL_COMMUNITY): Payer: Medicare Other

## 2012-08-26 ENCOUNTER — Ambulatory Visit (HOSPITAL_COMMUNITY)
Admission: RE | Admit: 2012-08-26 | Discharge: 2012-08-26 | Disposition: A | Discharge status: Home / Self Care | Payer: Medicare Other | Source: Ambulatory Visit | Attending: Oncology | Admitting: Oncology

## 2012-08-26 DIAGNOSIS — C50919 Malignant neoplasm of unspecified site of unspecified female breast: Secondary | ICD-10-CM | POA: Insufficient documentation

## 2012-08-26 DIAGNOSIS — J4489 Other specified chronic obstructive pulmonary disease: Secondary | ICD-10-CM | POA: Insufficient documentation

## 2012-08-26 DIAGNOSIS — F17200 Nicotine dependence, unspecified, uncomplicated: Secondary | ICD-10-CM | POA: Insufficient documentation

## 2012-08-26 DIAGNOSIS — J449 Chronic obstructive pulmonary disease, unspecified: Secondary | ICD-10-CM

## 2012-09-05 ENCOUNTER — Telehealth (HOSPITAL_COMMUNITY): Payer: Self-pay | Admitting: *Deleted

## 2012-09-05 NOTE — Telephone Encounter (Signed)
MSG left for patient to let her know that Dr. Mariel Sleet wanted her to f/u with pulmonologist re last CT scan of chest. I asked pt to call me back.

## 2012-09-06 ENCOUNTER — Telehealth (HOSPITAL_COMMUNITY): Payer: Self-pay | Admitting: *Deleted

## 2012-11-15 ENCOUNTER — Emergency Department (HOSPITAL_COMMUNITY)
Admission: EM | Admit: 2012-11-15 | Discharge: 2012-11-15 | Disposition: A | Discharge status: Home / Self Care | Payer: Medicare Other | Attending: Emergency Medicine | Admitting: Emergency Medicine

## 2012-11-15 ENCOUNTER — Encounter (HOSPITAL_COMMUNITY): Payer: Self-pay | Admitting: Emergency Medicine

## 2012-11-15 DIAGNOSIS — R0982 Postnasal drip: Secondary | ICD-10-CM | POA: Insufficient documentation

## 2012-11-15 DIAGNOSIS — R509 Fever, unspecified: Secondary | ICD-10-CM | POA: Insufficient documentation

## 2012-11-15 DIAGNOSIS — Z86718 Personal history of other venous thrombosis and embolism: Secondary | ICD-10-CM | POA: Insufficient documentation

## 2012-11-15 DIAGNOSIS — H6982 Other specified disorders of Eustachian tube, left ear: Secondary | ICD-10-CM

## 2012-11-15 DIAGNOSIS — F17200 Nicotine dependence, unspecified, uncomplicated: Secondary | ICD-10-CM | POA: Insufficient documentation

## 2012-11-15 DIAGNOSIS — Z79899 Other long term (current) drug therapy: Secondary | ICD-10-CM | POA: Insufficient documentation

## 2012-11-15 DIAGNOSIS — Z8739 Personal history of other diseases of the musculoskeletal system and connective tissue: Secondary | ICD-10-CM | POA: Insufficient documentation

## 2012-11-15 DIAGNOSIS — H6990 Unspecified Eustachian tube disorder, unspecified ear: Secondary | ICD-10-CM | POA: Insufficient documentation

## 2012-11-15 DIAGNOSIS — Z853 Personal history of malignant neoplasm of breast: Secondary | ICD-10-CM | POA: Insufficient documentation

## 2012-11-15 DIAGNOSIS — I10 Essential (primary) hypertension: Secondary | ICD-10-CM | POA: Insufficient documentation

## 2012-11-15 DIAGNOSIS — H6980 Other specified disorders of Eustachian tube, unspecified ear: Secondary | ICD-10-CM | POA: Insufficient documentation

## 2012-11-15 DIAGNOSIS — J019 Acute sinusitis, unspecified: Secondary | ICD-10-CM | POA: Insufficient documentation

## 2012-11-15 DIAGNOSIS — J3489 Other specified disorders of nose and nasal sinuses: Secondary | ICD-10-CM | POA: Insufficient documentation

## 2012-11-15 MED ORDER — ANTIPYRINE-BENZOCAINE 5.4-1.4 % OT SOLN
3.0000 [drp] | Freq: Once | OTIC | Status: AC
  Administered 2012-11-15: 4 [drp] via OTIC
  Filled 2012-11-15: qty 10

## 2012-11-15 MED ORDER — AMOXICILLIN 500 MG PO CAPS: 500.0000 mg | ORAL_CAPSULE | Freq: Three times a day (TID) | ORAL | Status: DC

## 2012-11-15 NOTE — ED Provider Notes (Signed)
History     CSN: [id number]  Arrival date & time 11/15/12  1039   First MD Initiated Contact with Patient 11/15/12 1048      Chief Complaint  Patient presents with  . Otalgia    (Consider location/radiation/quality/duration/timing/severity/associated sxs/prior treatment) HPI Comments: [name] is a 72 y.o. Female who presents with a 2 week history of left intermittent earache which radiates into her left cheek and is associated with nasal congestion which has been sometimes clear,  Sometimes thicker, but nonbloody discharge with subjective fevers and chills and sensation of constant post nasal drip.  She denies drainage from the ear.  She has mild throat irritation as well.  She denies nausea, vomiting,  Has had no cough, sob or chest pain.  Additionally denies allergy symptoms,  Specifically her eyes have not been itchy,  no eye drainage or sneezing. She has had no dizziness, tinnitus, headache or weakness.  She has tried advil and tylenol without relief of symptoms.     The history is provided by the patient.    Past Medical History  Diagnosis Date  . Hypertension   . Tendonitis 07/2010    shot of cortisone  . Embolism - blood clot     left lung  . Breast cancer     rt dx 2010; mastectomy only  . Breast CA 01/16/2011  . Infiltrating ductal carcinoma of breast 01/16/2011    Started Arimidex on 02/24/2009. Stage IB (T1b N0 M0) grade 1 infiltrating ductal carcinoma of the right breast, status post right modified radical mastectomy on 01/22/2009 for a 10 mm cancer, ER +100%, PR +98%, KI-67 marker was 9%, HER-2 negative, no LVI identified, and 11 lymph nodes were negative, all margins were clear. She had some associated DCIS, is now on Arimidex and she will take that for    Past Surgical History  Procedure Laterality Date  . Appendectomy  1974    blood clot in lung  . Abdominal hysterectomy  1987  . Breast biopsy  1968    left  . Tubal ligation  1972  . Nasal sinus surgery   2002  . Mastectomy modified radical  01/22/2009    rt  . Tear duct probing      Family History  Problem Relation Age of Onset  . Breast cancer Sister   . Breast cancer Maternal Grandmother   . Colon cancer Daughter     History  Substance Use Topics  . Smoking status: Current Every Day Smoker -- 0.50 packs/day    Types: Cigarettes  . Smokeless tobacco: Not on file  . Alcohol Use: No    OB History   Grav Para Term Preterm Abortions TAB SAB Ect Mult Living                  Review of Systems  Constitutional: Positive for fever and chills.  HENT: Positive for ear pain, congestion, rhinorrhea and sinus pressure. Negative for hearing loss, sore throat, facial swelling, neck pain, neck stiffness, dental problem, tinnitus and ear discharge.   Eyes: Negative.   Respiratory: Negative for cough, chest tightness and shortness of breath.   Cardiovascular: Negative for chest pain.  Gastrointestinal: Negative for nausea and abdominal pain.  Genitourinary: Negative.   Musculoskeletal: Negative for joint swelling and arthralgias.  Skin: Negative.  Negative for rash and wound.  Neurological: Negative for dizziness, weakness, light-headedness, numbness and headaches.  Psychiatric/Behavioral: Negative.     Allergies  Review of patient's allergies indicates no  known allergies.  Home Medications   Current Outpatient Rx  Name  Route  Sig  Dispense  Refill  . amLODipine (NORVASC) 5 MG tablet   Oral   Take 5 mg by mouth daily.         Marland Kitchen anastrozole (ARIMIDEX) 1 MG tablet   Oral   Take 1 tablet (1 mg total) by mouth daily.   30 tablet   5   . aspirin 325 MG tablet   Oral   Take 1,300 mg by mouth every 6 (six) hours as needed for pain.         . calcium carbonate (CALCIUM 500) 1250 MG tablet   Oral   Take 1 tablet by mouth 2 (two) times daily.           . Cholecalciferol (VITAMIN D3) 1000 UNITS CAPS   Oral   Take 2,000 Units by mouth daily.          Marland Kitchen esomeprazole  (NEXIUM) 40 MG capsule   Oral   Take 1 capsule (40 mg total) by mouth daily before breakfast.   30 capsule   5   . fish oil-omega-3 fatty acids 1000 MG capsule   Oral   Take 1 g by mouth daily.         Marland Kitchen ibuprofen (ADVIL,MOTRIN) 200 MG tablet   Oral   Take 800 mg by mouth every 6 (six) hours as needed for pain or headache.          . lovastatin (MEVACOR) 40 MG tablet   Oral   Take 40 mg by mouth at bedtime.          . metoprolol tartrate (LOPRESSOR) 25 MG tablet   Oral   Take 25 mg by mouth daily.          . nitroGLYCERIN (NITROSTAT) 0.4 MG SL tablet   Sublingual   Place 0.4 mg under the tongue every 5 (five) minutes as needed for chest pain.          Marland Kitchen senna (SENOKOT) 8.6 MG TABS   Oral   Take 2 tablets by mouth daily.         Marland Kitchen amoxicillin (AMOXIL) 500 MG capsule   Oral   Take 1 capsule (500 mg total) by mouth 3 (three) times daily.   21 capsule   0     BP 161/79  Pulse 68  Temp(Src) 98.1 F (36.7 C) (Oral)  Resp 16  Ht 5\' 6"  (1.676 m)  Wt 135 lb (61.236 kg)  BMI 21.8 kg/m2  SpO2 99%  Physical Exam  Constitutional: She is oriented to person, place, and time. She appears well-developed and well-nourished.  HENT:  Head: Normocephalic and atraumatic.  Right Ear: Tympanic membrane, external ear and ear canal normal.  Left Ear: Ear canal normal. No mastoid tenderness. Tympanic membrane is retracted.  Nose: Mucosal edema present. Left sinus exhibits maxillary sinus tenderness.  Mouth/Throat: Uvula is midline, oropharynx is clear and moist and mucous membranes are normal. No oropharyngeal exudate, posterior oropharyngeal edema, posterior oropharyngeal erythema or tonsillar abscesses.  No temple tenderness.  Eyes: Conjunctivae are normal.  Neck: Neck supple.  Cardiovascular: Normal rate and normal heart sounds.   Pulmonary/Chest: Effort normal. No respiratory distress. She has no decreased breath sounds. She has no wheezes. She has no rhonchi. She has  no rales.  Abdominal: Soft. There is no tenderness.  Musculoskeletal: Normal range of motion.  Neurological: She is alert and oriented to person, place,  and time.  Skin: Skin is warm and dry. No rash noted.  Psychiatric: She has a normal mood and affect.    ED Course  Procedures (including critical care time)  Labs Reviewed - No data to display No results found.   1. Sinusitis, acute   2. Eustachian tube dysfunction, left    Pt was given auralan drops with complete relief or ear pain.   MDM  Pt was given the auralgan - instructed 3-4 drops in left q4 hours prn pain.  Amoxil prescribed,  Encouraged coricidin decongestant product.  Warm compresses,  Tylenol, menthol lozenges/vicks for temp relief of congestion.  F/u with pcp this week if not improving.        Burgess Amor, PA-C 11/15/12 2204

## 2012-11-15 NOTE — ED Notes (Signed)
Earache for twow eeks.

## 2012-11-16 NOTE — ED Provider Notes (Signed)
Medical screening examination/treatment/procedure(s) were performed by non-physician practitioner and as supervising physician I was immediately available for consultation/collaboration.   Laray Anger, DO 11/16/12 [phone]

## 2012-11-18 ENCOUNTER — Encounter: Payer: Self-pay | Admitting: Emergency Medicine

## 2012-12-31 ENCOUNTER — Other Ambulatory Visit (HOSPITAL_COMMUNITY): Payer: Self-pay | Admitting: Oncology

## 2012-12-31 DIAGNOSIS — Z139 Encounter for screening, unspecified: Secondary | ICD-10-CM

## 2013-01-02 ENCOUNTER — Ambulatory Visit (HOSPITAL_COMMUNITY)
Admission: RE | Admit: 2013-01-02 | Discharge: 2013-01-02 | Disposition: A | Discharge status: Home / Self Care | Payer: Medicare Other | Source: Ambulatory Visit | Attending: Oncology | Admitting: Oncology

## 2013-01-02 DIAGNOSIS — Z139 Encounter for screening, unspecified: Secondary | ICD-10-CM

## 2013-01-02 DIAGNOSIS — Z1231 Encounter for screening mammogram for malignant neoplasm of breast: Secondary | ICD-10-CM | POA: Insufficient documentation

## 2013-01-27 ENCOUNTER — Other Ambulatory Visit (HOSPITAL_COMMUNITY): Payer: Self-pay | Admitting: Oncology

## 2013-01-27 DIAGNOSIS — C50911 Malignant neoplasm of unspecified site of right female breast: Secondary | ICD-10-CM

## 2013-01-27 MED ORDER — ANASTROZOLE 1 MG PO TABS: 1.0000 mg | ORAL_TABLET | Freq: Every day | ORAL | Status: DC

## 2013-02-14 ENCOUNTER — Ambulatory Visit (HOSPITAL_COMMUNITY): Payer: Medicare Other | Admitting: Oncology

## 2013-02-14 NOTE — Progress Notes (Signed)
-  No show, letter sent-   

## 2013-02-27 ENCOUNTER — Other Ambulatory Visit (HOSPITAL_COMMUNITY): Payer: Self-pay | Admitting: Oncology

## 2013-02-27 DIAGNOSIS — K219 Gastro-esophageal reflux disease without esophagitis: Secondary | ICD-10-CM

## 2013-02-27 MED ORDER — ESOMEPRAZOLE MAGNESIUM 40 MG PO CPDR: 40.0000 mg | DELAYED_RELEASE_CAPSULE | Freq: Every day | ORAL | Status: DC

## 2013-03-19 ENCOUNTER — Encounter (HOSPITAL_COMMUNITY): Payer: Self-pay | Admitting: Oncology

## 2013-05-13 ENCOUNTER — Encounter (HOSPITAL_COMMUNITY): Payer: Medicare Other | Attending: Hematology and Oncology

## 2013-05-13 ENCOUNTER — Encounter (HOSPITAL_COMMUNITY): Payer: Self-pay

## 2013-05-13 VITALS — BP 134/74 | HR 77 | Temp 98.5°F | Resp 18 | Wt 134.7 lb

## 2013-05-13 DIAGNOSIS — Z09 Encounter for follow-up examination after completed treatment for conditions other than malignant neoplasm: Secondary | ICD-10-CM | POA: Insufficient documentation

## 2013-05-13 DIAGNOSIS — Z853 Personal history of malignant neoplasm of breast: Secondary | ICD-10-CM | POA: Insufficient documentation

## 2013-05-13 DIAGNOSIS — Z170 Estrogen receptor positive status [ER+]: Secondary | ICD-10-CM

## 2013-05-13 DIAGNOSIS — Z86711 Personal history of pulmonary embolism: Secondary | ICD-10-CM

## 2013-05-13 DIAGNOSIS — C50919 Malignant neoplasm of unspecified site of unspecified female breast: Secondary | ICD-10-CM

## 2013-05-13 DIAGNOSIS — J449 Chronic obstructive pulmonary disease, unspecified: Secondary | ICD-10-CM

## 2013-05-13 LAB — CBC WITH DIFFERENTIAL/PLATELET
Eosinophils Absolute: 0.2 10*3/uL (ref 0.0–0.7)
Eosinophils Relative: 2 % (ref 0–5)
Hemoglobin: 12.9 g/dL (ref 12.0–15.0)
Lymphs Abs: 1.7 10*3/uL (ref 0.7–4.0)
MCH: 31.2 pg (ref 26.0–34.0)
MCV: 91.3 fL (ref 78.0–100.0)
Monocytes Absolute: 0.6 10*3/uL (ref 0.1–1.0)
Monocytes Relative: 8 % (ref 3–12)
RBC: 4.13 MIL/uL (ref 3.87–5.11)

## 2013-05-13 LAB — COMPREHENSIVE METABOLIC PANEL
Alkaline Phosphatase: 65 U/L (ref 39–117)
BUN: 11 mg/dL (ref 6–23)
Calcium: 10.1 mg/dL (ref 8.4–10.5)
GFR calc Af Amer: >90 mL/min (ref >90)
Glucose, Bld: 124 mg/dL — ABNORMAL HIGH (ref 70–99)
Total Protein: 7.5 g/dL (ref 6.0–8.3)

## 2013-05-13 NOTE — Patient Instructions (Signed)
.  [facility] Discharge Instructions  RECOMMENDATIONS MADE BY THE CONSULTANT AND ANY TEST RESULTS WILL BE SENT TO YOUR REFERRING PHYSICIAN.  EXAM FINDINGS BY THE PHYSICIAN TODAY AND SIGNS OR SYMPTOMS TO REPORT TO CLINIC OR PRIMARY PHYSICIAN: Exam and findings as discussed by Dr. Zigmund Daniel.   INSTRUCTIONS/FOLLOW-UP: 6 months  Thank you for choosing [facility] to provide your oncology and hematology care.  To afford each patient quality time with our providers, please arrive at least 15 minutes before your scheduled appointment time.  With your help, our goal is to use those 15 minutes to complete the necessary work-up to ensure our physicians have the information they need to help with your evaluation and healthcare recommendations.    Effective January 1st, 2014, we ask that you re-schedule your appointment with our physicians should you arrive 10 or more minutes late for your appointment.  We strive to give you quality time with our providers, and arriving late affects you and other patients whose appointments are after yours.    Again, thank you for choosing [facility].  Our hope is that these requests will decrease the amount of time that you wait before being seen by our physicians.       _____________________________________________________________  Should you have questions after your visit to [facility], please contact our office at [phone] between the hours of 8:30 a.m. and 5:00 p.m.  Voicemails left after 4:30 p.m. will not be returned until the following business day.  For prescription refill requests, have your pharmacy contact our office with your prescription refill request.

## 2013-05-13 NOTE — Progress Notes (Signed)
[facility] OFFICE PROGRESS NOTE  Lauren Gully, MD [address] [place] [address]  DIAGNOSIS: Infiltrating ductal carcinoma of breast, unspecified laterality - Plan: CBC with Differential, Comprehensive metabolic panel, CEA, Cancer antigen 27.29  Chief Complaint  Patient presents with  . Breast Cancer    followup    CURRENT THERAPY: Anastrozole 1 mg daily  INTERVAL HISTORY: [name] 72 y.o. female returns for followup of stage I right breast cancer diagnosed in June of 2010 treated postoperatively with anastrozole which she continues to take. Primary treatment was mastectomy with axillary lymph node dissection where 11 lymph nodes were found to be negative. Primary tumor was 10 mm. She does experience occasional hot flashes and does have back pain for which he uses Advil with relief. She denies any vaginal dryness, incontinence, vaginal bleeding, abnormalities on self breast examination, upper extremity swelling, lower M.D. swelling, fever, night sweats, PND, orthopnea, palpitations, skin rash, headache, or seizures.    MEDICAL HISTORY: Past Medical History  Diagnosis Date  . Hypertension   . Tendonitis 07/2010    shot of cortisone  . Embolism - blood clot     left lung  . Breast cancer     rt dx 2010; mastectomy only  . Breast CA 01/16/2011  . Infiltrating ductal carcinoma of breast 01/16/2011    Started Arimidex on 02/24/2009. Stage IB (T1b N0 M0) grade 1 infiltrating ductal carcinoma of the right breast, status post right modified radical mastectomy on 01/22/2009 for a 10 mm cancer, ER +100%, PR +98%, KI-67 marker was 9%, HER-2 negative, no LVI identified, and 11 lymph nodes were negative, all margins were clear. She had some associated DCIS, is now on Arimidex and she will take that for    INTERIM HISTORY: has DEQUERVAIN'S; Tendonitis; Infiltrating ductal carcinoma of breast; and Embolism - blood clot on her problem list.   stage IB (T1 B.,  N0, M0) grade 1 infiltrating ductal carcinoma the right breast status post right modified radical mastectomy 11 negative nodes on 01/22/2009 for 10 mm cancer. Your receptors 100%, PR receptors 98%, Ki-67 marker low at 9% and HER-2/neu was not amplified. Margins were clear. She did have associated DCIS and was placed on Arimidex which she will take for 5 years  ALLERGIES:  has No Known Allergies.  MEDICATIONS: has a current medication list which includes the following prescription(s): amlodipine, amoxicillin, anastrozole, aspirin, calcium carbonate, vitamin d3, esomeprazole, fish oil-omega-3 fatty acids, ibuprofen, lovastatin, metoprolol tartrate, nitroglycerin, and senna.  SURGICAL HISTORY:  Past Surgical History  Procedure Laterality Date  . Appendectomy  1974    blood clot in lung  . Abdominal hysterectomy  1987  . Breast biopsy  1968    left  . Tubal ligation  1972  . Nasal sinus surgery  2002  . Mastectomy modified radical  01/22/2009    rt  . Tear duct probing      FAMILY HISTORY: family history includes Breast cancer in her maternal grandmother and sister; Colon cancer in her daughter.  SOCIAL HISTORY:  reports that she has been smoking Cigarettes.  She has been smoking about 0.50 packs per day. She does not have any smokeless tobacco history on file. She reports that she does not drink alcohol.  REVIEW OF SYSTEMS:  Other than that discussed above is noncontributory.  PHYSICAL EXAMINATION: ECOG PERFORMANCE STATUS: 1 - Symptomatic but completely ambulatory  There were no vitals taken for this visit.  GENERAL:alert, no distress and comfortable SKIN:  skin color, texture, turgor are normal, no rashes or significant lesions EYES: PERLA; Conjunctiva are pink and non-injected, sclera clear OROPHARYNX:no exudate, no erythema on lips, buccal mucosa, or tongue. NECK: supple, thyroid normal size, non-tender, without nodularity. No masses CHEST: Status post right mastectomy with no  subcutaneous nodules. Left breast without mass. LYMPH:  no palpable lymphadenopathy in the cervical, axillary or inguinal LUNGS: clear to auscultation and percussion with normal breathing effort HEART: regular rate & rhythm and no murmurs and no lower extremity edema ABDOMEN:abdomen soft, non-tender and normal bowel sounds MUSCULOSKELETAL:no cyanosis of digits and no clubbing. Range of motion normal.  NEURO: alert & oriented x 3 with fluent speech, no focal motor/sensory deficits   LABORATORY DATA: No visits with results within 30 Day(s) from this visit. Latest known visit with results is:  Hospital Outpatient Visit on 04/04/2011  Component Date Value Range Status  . FIO2 04/04/2011 .21   Final  . pH, Arterial 04/04/2011 7.475* 7.350 - 7.400 Final  . pCO2 arterial 04/04/2011 34.4* 35.0 - 45.0 mmHg Final  . pO2, Arterial 04/04/2011 87.3  80.0 - 100.0 mmHg Final  . Bicarbonate 04/04/2011 25.0* 20.0 - 24.0 mEq/L Final  . TCO2 04/04/2011 21.8  0 - 100 mmol/L Final  . Acid-Base Excess 04/04/2011 1.7  0.0 - 2.0 mmol/L Final  . O2 Saturation 04/04/2011 97.5   Final  . Patient temperature 04/04/2011 37.0   Final  . Collection site 04/04/2011 LEFT BRACHIAL   Final  . Drawn by 04/04/2011 COLLECTED BY RT   Final  . Sample type 04/04/2011 ARTERIAL   Final  . Allens test (pass/fail) 04/04/2011 NOT INDICATED* PASS Final    PATHOLOGY:  Urinalysis No results found for this basename: colorurine, appearanceur, labspec, phurine, glucoseu, hgbur, bilirubinur, ketonesur, proteinur, urobilinogen, nitrite, leukocytesur    RADIOGRAPHIC STUDIES: MM Digital Screening Unilat L Status: Final result            Study Result    *RADIOLOGY REPORT*  Clinical Data: Screening. Right mastectomy.  LEFT DIGITAL SCREENING MAMMOGRAM WITH CAD  Comparison: None.  FINDINGS:  ACR Breast Density Category: 2: There is a scattered fibroglandular  pattern.  The patient has had a right mastectomy. No suspicious  masses,  architectural distortion, or calcifications are present.  Images were processed with CAD.  IMPRESSION:  No evidence of malignancy. Screening mammography is recommended in  one year.  RECOMMENDATION:  Screening mammogram in one year. (Code:SM-B-01Y)  BI-RADS CATEGORY 1: Negative.  Original Report Authenticated By: Norva Pavlov, M     ASSESSMENT:  #1. stage IB (T1 B., N0, M0) grade 1 infiltrating ductal carcinoma the right breast status post right modified radical mastectomy 11 negative nodes on 01/22/2009 for 10 mm cancer. Your receptors 100%, PR receptors 98%, Ki-67 marker low at 9% and HER-2/neu was not amplified. Margins were clear. She did have associated DCIS and was placed on Arimidex which she will take for 5 years, tolerating well. #2. Psittacosis, improved after no longer caring for her parakeet. #3. History of pulmonary embolism after appendectomy 1974, no evidence of recurrence.  PLAN: #1. Continue anastrozole 1 mg daily. #2. Call if joint pains worsen, vaginal dryness occurs, or if off flashes worsened.   All questions were answered. The patient knows to call the clinic with any problems, questions or concerns. We can certainly see the patient much sooner if necessary.   I spent 25 minutes counseling the patient face to face. The total time spent in the appointment was 30  minutes.    Maurilio Lovely, MD 05/13/2013 1:23 PM

## 2013-05-14 LAB — CEA: CEA: 1.2 ng/mL (ref 0.0–5.0)

## 2013-07-09 ENCOUNTER — Encounter (HOSPITAL_COMMUNITY): Payer: Self-pay | Admitting: Emergency Medicine

## 2013-07-09 ENCOUNTER — Emergency Department (HOSPITAL_COMMUNITY)
Admission: EM | Admit: 2013-07-09 | Discharge: 2013-07-09 | Disposition: A | Discharge status: Home / Self Care | Payer: Medicare Other | Attending: Emergency Medicine | Admitting: Emergency Medicine

## 2013-07-09 ENCOUNTER — Emergency Department (HOSPITAL_COMMUNITY): Payer: Medicare Other

## 2013-07-09 DIAGNOSIS — Z9889 Other specified postprocedural states: Secondary | ICD-10-CM | POA: Insufficient documentation

## 2013-07-09 DIAGNOSIS — Z79899 Other long term (current) drug therapy: Secondary | ICD-10-CM | POA: Insufficient documentation

## 2013-07-09 DIAGNOSIS — I10 Essential (primary) hypertension: Secondary | ICD-10-CM | POA: Insufficient documentation

## 2013-07-09 DIAGNOSIS — Y9289 Other specified places as the place of occurrence of the external cause: Secondary | ICD-10-CM | POA: Insufficient documentation

## 2013-07-09 DIAGNOSIS — Z86718 Personal history of other venous thrombosis and embolism: Secondary | ICD-10-CM | POA: Insufficient documentation

## 2013-07-09 DIAGNOSIS — S20219A Contusion of unspecified front wall of thorax, initial encounter: Secondary | ICD-10-CM | POA: Insufficient documentation

## 2013-07-09 DIAGNOSIS — Y9389 Activity, other specified: Secondary | ICD-10-CM | POA: Insufficient documentation

## 2013-07-09 DIAGNOSIS — F17200 Nicotine dependence, unspecified, uncomplicated: Secondary | ICD-10-CM | POA: Insufficient documentation

## 2013-07-09 DIAGNOSIS — X500XXA Overexertion from strenuous movement or load, initial encounter: Secondary | ICD-10-CM | POA: Insufficient documentation

## 2013-07-09 DIAGNOSIS — R209 Unspecified disturbances of skin sensation: Secondary | ICD-10-CM | POA: Insufficient documentation

## 2013-07-09 DIAGNOSIS — Z853 Personal history of malignant neoplasm of breast: Secondary | ICD-10-CM | POA: Insufficient documentation

## 2013-07-09 DIAGNOSIS — Z7982 Long term (current) use of aspirin: Secondary | ICD-10-CM | POA: Insufficient documentation

## 2013-07-09 DIAGNOSIS — S20212A Contusion of left front wall of thorax, initial encounter: Secondary | ICD-10-CM

## 2013-07-09 MED ORDER — HYDROCODONE-ACETAMINOPHEN 5-325 MG PO TABS: 1.0000 | ORAL_TABLET | ORAL | Status: DC | PRN

## 2013-07-09 NOTE — ED Notes (Signed)
Pt states she hurt her arm and lower left chest (under lt breast) on Sat after trimming a Christmas tree, pain progressively worse. Pt states lt arm has numbness and tingling that comes and goes.

## 2013-07-09 NOTE — ED Provider Notes (Signed)
CSN: [id number]     Arrival date & time 07/09/13  4098 History   First MD Initiated Contact with Patient 07/09/13 0930     Chief Complaint  Patient presents with  . Chest Injury  . Numbness    lt arm   (Consider location/radiation/quality/duration/timing/severity/associated sxs/prior Treatment) HPI Comments: Patient is a 72 year old female who presents to the emergency department with left upper chest area pain. The patient states that on Saturday, December 8 she was attempting to trim a limb from her Christmas tree. She was using a limb cut her and applied force with her upper chest to that one of the limbs when she bruised the left upper chest. The patient took over-the-counter medication and applied ice initially which resolved some of her more immediate pain, but she continues to have pain with certain movements, with deep breathing, and with certain positions. The patient denies any hemoptysis. She denies any new changes in her breathing. She has chronic lung disease, but states he's not having any new shortness of breath or problems breathing. There's been no temperature elevations. His been no hemoptysis reported.  The history is provided by the patient.    Past Medical History  Diagnosis Date  . Hypertension   . Tendonitis 07/2010    shot of cortisone  . Embolism - blood clot     left lung  . Breast cancer     rt dx 2010; mastectomy only  . Breast CA 01/16/2011  . Infiltrating ductal carcinoma of breast 01/16/2011    Started Arimidex on 02/24/2009. Stage IB (T1b N0 M0) grade 1 infiltrating ductal carcinoma of the right breast, status post right modified radical mastectomy on 01/22/2009 for a 10 mm cancer, ER +100%, PR +98%, KI-67 marker was 9%, HER-2 negative, no LVI identified, and 11 lymph nodes were negative, all margins were clear. She had some associated DCIS, is now on Arimidex and she will take that for   Past Surgical History  Procedure Laterality Date  . Appendectomy   1974    blood clot in lung  . Abdominal hysterectomy  1987  . Breast biopsy  1968    left  . Tubal ligation  1972  . Nasal sinus surgery  2002  . Mastectomy modified radical  01/22/2009    rt  . Tear duct probing     Family History  Problem Relation Age of Onset  . Breast cancer Sister   . Breast cancer Maternal Grandmother   . Colon cancer Daughter    History  Substance Use Topics  . Smoking status: Current Every Day Smoker -- 0.50 packs/day    Types: Cigarettes  . Smokeless tobacco: Not on file  . Alcohol Use: No   OB History   Grav Para Term Preterm Abortions TAB SAB Ect Mult Living                 Review of Systems  Constitutional: Negative for activity change.       All ROS Neg except as noted in HPI  HENT: Negative for nosebleeds.   Eyes: Negative for photophobia and discharge.  Respiratory: Negative for cough, shortness of breath and wheezing.   Cardiovascular: Negative for chest pain and palpitations.  Gastrointestinal: Negative for abdominal pain and blood in stool.  Genitourinary: Negative for dysuria, frequency and hematuria.  Musculoskeletal: Negative for arthralgias, back pain and neck pain.  Skin: Negative.   Neurological: Negative for dizziness, seizures and speech difficulty.  Psychiatric/Behavioral: Negative for hallucinations and confusion.  Allergies  Review of patient's allergies indicates no known allergies.  Home Medications   Current Outpatient Rx  Name  Route  Sig  Dispense  Refill  . amLODipine (NORVASC) 5 MG tablet   Oral   Take 5 mg by mouth daily.         Marland Kitchen anastrozole (ARIMIDEX) 1 MG tablet   Oral   Take 1 tablet (1 mg total) by mouth daily.   30 tablet   5   . aspirin EC 81 MG tablet   Oral   Take 81 mg by mouth daily.         . Calcium Carb-Cholecalciferol (CALCIUM + D3 PO)   Oral   Take 2 capsules by mouth 2 (two) times daily.         . Cholecalciferol (VITAMIN D) 2000 UNITS CAPS   Oral   Take 1 capsule by  mouth 2 (two) times daily.         . fish oil-omega-3 fatty acids 1000 MG capsule   Oral   Take 1 g by mouth daily.         Marland Kitchen lovastatin (MEVACOR) 40 MG tablet   Oral   Take 40 mg by mouth at bedtime.          . metoprolol tartrate (LOPRESSOR) 25 MG tablet   Oral   Take 25 mg by mouth daily.          . nitroGLYCERIN (NITROSTAT) 0.4 MG SL tablet   Sublingual   Place 0.4 mg under the tongue every 5 (five) minutes as needed for chest pain.          Marland Kitchen omeprazole (PRILOSEC) 20 MG capsule   Oral   Take 1 capsule by mouth daily.          BP 162/61  Pulse 70  Temp(Src) 98.4 F (36.9 C) (Oral)  Resp 20  Ht 5\' 6"  (1.676 m)  Wt 135 lb (61.236 kg)  BMI 21.80 kg/m2  SpO2 100% Physical Exam  Nursing note and vitals reviewed. Constitutional: She is oriented to person, place, and time. She appears well-developed and well-nourished.  Non-toxic appearance.  HENT:  Head: Normocephalic.  Right Ear: Tympanic membrane and external ear normal.  Left Ear: Tympanic membrane and external ear normal.  Eyes: EOM and lids are normal. Pupils are equal, round, and reactive to light.  Neck: Normal range of motion. Neck supple. Carotid bruit is not present.  Cardiovascular: Normal rate, regular rhythm, normal heart sounds, intact distal pulses and normal pulses.   Pulmonary/Chest: Breath sounds normal. No respiratory distress. She has no decreased breath sounds. She has no wheezes. She has no rhonchi. She has no rales.    Abdominal: Soft. Bowel sounds are normal. There is no tenderness. There is no guarding.  Musculoskeletal: Normal range of motion.  Lymphadenopathy:       Head (right side): No submandibular adenopathy present.       Head (left side): No submandibular adenopathy present.    She has no cervical adenopathy.  Neurological: She is alert and oriented to person, place, and time. She has normal strength. No cranial nerve deficit or sensory deficit.  Skin: Skin is warm and  dry.  Psychiatric: She has a normal mood and affect. Her speech is normal.    ED Course  Procedures (including critical care time) Labs Review Labs Reviewed - No data to display Imaging Review Dg Chest 2 View  07/09/2013   CLINICAL DATA:  Left anterior  inferior chest pain after blunt trauma.  EXAM: CHEST  2 VIEW  COMPARISON:  Chest x-ray dated 01/20/2009 and chest CT scan dated 08/26/2012  FINDINGS: There is no visible rib fracture or other acute osseous abnormality no pneumothorax or lung contusion or pleural effusion. Heart size and vascularity are normal.  The chronic interstitial disease which is primarily at the lung bases has appreciably progressed since the prior exams.  IMPRESSION: No rib fracture or other acute osseous abnormality. Progressive chronic interstitial lung disease.   Electronically Signed   By: Geanie Cooley M.D.   On: 07/09/2013 10:09    EKG Interpretation    Date/Time:  Wednesday July 09 2013 09:36:36 EST Ventricular Rate:  71 PR Interval:  212 QRS Duration: 88 QT Interval:  414 QTC Calculation: 449 R Axis:   18 Text Interpretation:  Sinus rhythm with 1st degree A-V block Otherwise normal ECG When compared with ECG of 28-Sep-2010 14:04, No significant change was found Confirmed by CAMPOS  MD, KEVIN ([id number]) on 07/09/2013 9:51:02 AM            MDM  No diagnosis found. *I have reviewed nursing notes, vital signs, and all appropriate lab and imaging results for this patient.**  The electrocardiogram reveals sinus rhythm with first degree AV block. No acute or emergent changes appreciated. X-ray of the chest is negative for rib fracture or injury to the lung. Pulse oximetry is 100% on room air. Within normal limits by my interpretation. The patient speaks in complete sentences without any problem whatsoever. Color is good.  Examination, EKG evaluation, and chest x-ray all suggest contusion to the left ribs. Patient advised to use her incentive barometer  several times during the day. She is already using ibuprofen with some success. Will add Norco every 4 hours if needed for pain. Patient will follow with her primary physician, or return to the emergency department if any problems.  Kathie Dike, PA-C 07/09/13 1055

## 2013-07-09 NOTE — ED Notes (Signed)
Pt reports was pruning some trees Saturday and leaned over on the pruning shears to add weight.  Pt says had sudden pain under left breast.  Reports pain has been worse since then and reports a pins and needles sensation in left arm since Monday.  Area under left breast tender to touch.  Denies n/v or SOB.  Denies chest pain or heaviness.

## 2013-07-11 NOTE — ED Provider Notes (Signed)
Medical screening examination/treatment/procedure(s) were conducted as a shared visit with non-physician practitioner(s) and myself.  I personally evaluated the patient during the encounter.  EKG Interpretation    Date/Time:  Wednesday July 09 2013 09:36:36 EST Ventricular Rate:  71 PR Interval:  212 QRS Duration: 88 QT Interval:  414 QTC Calculation: 449 R Axis:   18 Text Interpretation:  Sinus rhythm with 1st degree A-V block Otherwise normal ECG When compared with ECG of 28-Sep-2010 14:04, No significant change was found Confirmed by Rawson Minix  MD, Wrenly Lauritsen ([id number]) on 07/09/2013 9:51:02 AM            mechical injury to chest. cxr normal. ecg without ischemic changes. Reproducible exam  Lyanne Co, MD 07/11/13 2307

## 2013-07-29 ENCOUNTER — Other Ambulatory Visit (HOSPITAL_COMMUNITY): Payer: Self-pay | Admitting: Oncology

## 2013-07-29 DIAGNOSIS — C50911 Malignant neoplasm of unspecified site of right female breast: Secondary | ICD-10-CM

## 2013-07-29 MED ORDER — ANASTROZOLE 1 MG PO TABS: 1.0000 mg | ORAL_TABLET | Freq: Every day | ORAL | Status: DC

## 2013-08-02 ENCOUNTER — Emergency Department (HOSPITAL_COMMUNITY): Payer: Medicare HMO

## 2013-08-02 ENCOUNTER — Emergency Department (HOSPITAL_COMMUNITY)
Admission: EM | Admit: 2013-08-02 | Discharge: 2013-08-02 | Disposition: A | Discharge status: Home / Self Care | Payer: Medicare HMO | Attending: Emergency Medicine | Admitting: Emergency Medicine

## 2013-08-02 ENCOUNTER — Encounter (HOSPITAL_COMMUNITY): Payer: Self-pay | Admitting: Emergency Medicine

## 2013-08-02 DIAGNOSIS — B9789 Other viral agents as the cause of diseases classified elsewhere: Secondary | ICD-10-CM | POA: Insufficient documentation

## 2013-08-02 DIAGNOSIS — R5383 Other fatigue: Secondary | ICD-10-CM

## 2013-08-02 DIAGNOSIS — Z79899 Other long term (current) drug therapy: Secondary | ICD-10-CM | POA: Insufficient documentation

## 2013-08-02 DIAGNOSIS — R51 Headache: Secondary | ICD-10-CM | POA: Insufficient documentation

## 2013-08-02 DIAGNOSIS — Z8739 Personal history of other diseases of the musculoskeletal system and connective tissue: Secondary | ICD-10-CM | POA: Insufficient documentation

## 2013-08-02 DIAGNOSIS — I10 Essential (primary) hypertension: Secondary | ICD-10-CM | POA: Insufficient documentation

## 2013-08-02 DIAGNOSIS — R5381 Other malaise: Secondary | ICD-10-CM | POA: Insufficient documentation

## 2013-08-02 DIAGNOSIS — Z9889 Other specified postprocedural states: Secondary | ICD-10-CM | POA: Insufficient documentation

## 2013-08-02 DIAGNOSIS — Z7982 Long term (current) use of aspirin: Secondary | ICD-10-CM | POA: Insufficient documentation

## 2013-08-02 DIAGNOSIS — B349 Viral infection, unspecified: Secondary | ICD-10-CM

## 2013-08-02 DIAGNOSIS — J111 Influenza due to unidentified influenza virus with other respiratory manifestations: Secondary | ICD-10-CM | POA: Insufficient documentation

## 2013-08-02 DIAGNOSIS — Z853 Personal history of malignant neoplasm of breast: Secondary | ICD-10-CM | POA: Insufficient documentation

## 2013-08-02 DIAGNOSIS — F17200 Nicotine dependence, unspecified, uncomplicated: Secondary | ICD-10-CM | POA: Insufficient documentation

## 2013-08-02 DIAGNOSIS — R197 Diarrhea, unspecified: Secondary | ICD-10-CM | POA: Insufficient documentation

## 2013-08-02 DIAGNOSIS — R110 Nausea: Secondary | ICD-10-CM | POA: Insufficient documentation

## 2013-08-02 DIAGNOSIS — Z86711 Personal history of pulmonary embolism: Secondary | ICD-10-CM | POA: Insufficient documentation

## 2013-08-02 LAB — CBC WITH DIFFERENTIAL/PLATELET
BASOS ABS: 0 10*3/uL (ref 0.0–0.1)
BASOS PCT: 1 % (ref 0–1)
Eosinophils Absolute: 0.1 10*3/uL (ref 0.0–0.7)
Eosinophils Relative: 2 % (ref 0–5)
HEMATOCRIT: 38.2 % (ref 36.0–46.0)
Hemoglobin: 13.3 g/dL (ref 12.0–15.0)
LYMPHS PCT: 38 % (ref 12–46)
Lymphs Abs: 1.3 10*3/uL (ref 0.7–4.0)
MCH: 30.5 pg (ref 26.0–34.0)
MCHC: 34.8 g/dL (ref 30.0–36.0)
MCV: 87.6 fL (ref 78.0–100.0)
MONO ABS: 0.5 10*3/uL (ref 0.1–1.0)
Monocytes Relative: 14 % — ABNORMAL HIGH (ref 3–12)
NEUTROS ABS: 1.6 10*3/uL — AB (ref 1.7–7.7)
Neutrophils Relative %: 47 % (ref 43–77)
PLATELETS: 151 10*3/uL (ref 150–400)
RBC: 4.36 MIL/uL (ref 3.87–5.11)
RDW: 12.9 % (ref 11.5–15.5)
WBC: 3.3 10*3/uL — AB (ref 4.0–10.5)

## 2013-08-02 LAB — BASIC METABOLIC PANEL
BUN: 8 mg/dL (ref 6–23)
CHLORIDE: 98 meq/L (ref 96–112)
CO2: 28 meq/L (ref 19–32)
CREATININE: 0.77 mg/dL (ref 0.50–1.10)
Calcium: 9 mg/dL (ref 8.4–10.5)
GFR calc non Af Amer: 81 mL/min — ABNORMAL LOW (ref >90)
Glucose, Bld: 113 mg/dL — ABNORMAL HIGH (ref 70–99)
POTASSIUM: 3.6 meq/L — AB (ref 3.7–5.3)
SODIUM: 138 meq/L (ref 137–147)

## 2013-08-02 LAB — URINALYSIS, ROUTINE W REFLEX MICROSCOPIC
Bilirubin Urine: NEGATIVE
Glucose, UA: NEGATIVE mg/dL
Hgb urine dipstick: NEGATIVE
KETONES UR: NEGATIVE mg/dL
Leukocytes, UA: NEGATIVE
NITRITE: NEGATIVE
Protein, ur: NEGATIVE mg/dL
Urobilinogen, UA: 0.2 mg/dL (ref 0.0–1.0)
pH: 6.5 (ref 5.0–8.0)

## 2013-08-02 MED ORDER — SODIUM CHLORIDE 0.9 % IV BOLUS (SEPSIS)
500.0000 mL | Freq: Once | INTRAVENOUS | Status: AC
  Administered 2013-08-02: 500 mL via INTRAVENOUS

## 2013-08-02 MED ORDER — SODIUM CHLORIDE 0.9 % IV SOLN: INTRAVENOUS | Status: DC

## 2013-08-02 NOTE — ED Notes (Signed)
Pt c/o fever followed by nasal congestion, productive cough-clear sputum,v/d

## 2013-08-02 NOTE — ED Provider Notes (Signed)
CSN: [id number]     Arrival date & time 08/02/13  1125 History  This chart was scribed for Richarda Blade, MD,  by Stacy Gardner, ED Scribe. The patient was seen in room APA19/APA19 and the patient's care was started at 12:54 PM.  First MD Initiated Contact with Patient 08/02/13 1252     Chief Complaint  Patient presents with  . Influenza   (Consider location/radiation/quality/duration/timing/severity/associated sxs/prior Treatment) Patient is a 73 y.o. female presenting with flu symptoms.  Influenza Presenting symptoms: cough, diarrhea, fatigue, fever, headache, nausea and rhinorrhea   Presenting symptoms: no sore throat and no vomiting   Associated symptoms: chills and nasal congestion   HPI HPI Comments: [name] is a 73 y.o. female who presents to the Emergency Department complaining of Influenza for the past five days. Pt has the associated symptoms of nausea, headaches, diarrhea, nasal congestion, cough (intermittent), fever, chills, and dry mouth. She denies vomiting, decreased appetite and sore throat. She has tried resting to alleviate symptoms. Nothing seems to totally resolve her symptoms.  She had sick contact with daughter and son-in-law. Pt states she feels "plum wore out" and stays in bed all day. Pt is active and able to independently perform her ADL. She has a medical hx of HTN, tendonitis, embolism, cancer and osteoporosis. She does not have any known allergies to medications. Pt currently smokes 0.5 packs of cigarettes a day.  Past Medical History  Diagnosis Date  . Hypertension   . Tendonitis 07/2010    shot of cortisone  . Embolism - blood clot     left lung  . Breast cancer     rt dx 2010; mastectomy only  . Breast CA 01/16/2011  . Infiltrating ductal carcinoma of breast 01/16/2011    Started Arimidex on 02/24/2009. Stage IB (T1b N0 M0) grade 1 infiltrating ductal carcinoma of the right breast, status post right modified radical mastectomy on 01/22/2009 for a 10  mm cancer, ER +100%, PR +98%, KI-67 marker was 9%, HER-2 negative, no LVI identified, and 11 lymph nodes were negative, all margins were clear. She had some associated DCIS, is now on Arimidex and she will take that for   Past Surgical History  Procedure Laterality Date  . Appendectomy  1974    blood clot in lung  . Abdominal hysterectomy  1987  . Breast biopsy  1968    left  . Tubal ligation  1972  . Nasal sinus surgery  2002  . Mastectomy modified radical  01/22/2009    rt  . Tear duct probing     Family History  Problem Relation Age of Onset  . Breast cancer Sister   . Breast cancer Maternal Grandmother   . Colon cancer Daughter    History  Substance Use Topics  . Smoking status: Current Every Day Smoker -- 0.50 packs/day    Types: Cigarettes  . Smokeless tobacco: Not on file  . Alcohol Use: No   OB History   Grav Para Term Preterm Abortions TAB SAB Ect Mult Living                 Review of Systems  Constitutional: Positive for fever, chills and fatigue. Negative for appetite change.  HENT: Positive for congestion and rhinorrhea. Negative for sore throat.   Respiratory: Positive for cough.   Gastrointestinal: Positive for nausea and diarrhea. Negative for vomiting.  Neurological: Positive for headaches.  All other systems reviewed and are negative.    Allergies  Review of patient's allergies indicates no known allergies.  Home Medications   Current Outpatient Rx  Name  Route  Sig  Dispense  Refill  . amLODipine (NORVASC) 5 MG tablet   Oral   Take 5 mg by mouth daily.         Marland Kitchen anastrozole (ARIMIDEX) 1 MG tablet   Oral   Take 1 tablet (1 mg total) by mouth daily.   30 tablet   6     5 years will be complete on 02/24/2014   . aspirin EC 81 MG tablet   Oral   Take 81 mg by mouth daily.         . Calcium Carb-Cholecalciferol (CALCIUM + D3 PO)   Oral   Take 2 capsules by mouth 2 (two) times daily.         . Cholecalciferol (VITAMIN D) 2000 UNITS  CAPS   Oral   Take 1 capsule by mouth 2 (two) times daily.         . fish oil-omega-3 fatty acids 1000 MG capsule   Oral   Take 1 g by mouth daily.         Marland Kitchen lovastatin (MEVACOR) 40 MG tablet   Oral   Take 40 mg by mouth at bedtime.          . metoprolol tartrate (LOPRESSOR) 25 MG tablet   Oral   Take 25 mg by mouth daily.          Marland Kitchen omeprazole (PRILOSEC) 20 MG capsule   Oral   Take 1 capsule by mouth daily.         Marland Kitchen HYDROcodone-acetaminophen (NORCO) 5-325 MG per tablet   Oral   Take 1 tablet by mouth every 4 (four) hours as needed for moderate pain.   15 tablet   0   . nitroGLYCERIN (NITROSTAT) 0.4 MG SL tablet   Sublingual   Place 0.4 mg under the tongue every 5 (five) minutes as needed for chest pain.           BP 117/54  Pulse 75  Temp(Src) 98.2 F (36.8 C)  Resp 18  Ht 5' 6"  (1.676 m)  Wt 135 lb (61.236 kg)  BMI 21.80 kg/m2  SpO2 97% Physical Exam  Nursing note and vitals reviewed. Constitutional: She is oriented to person, place, and time. She appears well-developed and well-nourished.  HENT:  Head: Normocephalic and atraumatic.  Eyes: Conjunctivae and EOM are normal. Pupils are equal, round, and reactive to light.  Neck: Normal range of motion and phonation normal. Neck supple.  Cardiovascular: Normal rate, regular rhythm and intact distal pulses.   Pulmonary/Chest: Effort normal. She has decreased breath sounds. She has no wheezes. She has rhonchi (scattered). She exhibits no tenderness.  Abdominal: Soft. She exhibits no distension. There is no tenderness. There is no guarding.  Musculoskeletal: Normal range of motion.  Neurological: She is alert and oriented to person, place, and time. She exhibits normal muscle tone.  Skin: Skin is warm and dry.  Psychiatric: She has a normal mood and affect. Her behavior is normal. Judgment and thought content normal.    ED Course  Procedures (including critical care time) 1:00 PM Discussed course of  care with pt which includes chest x-ray, EKG,sodium chloride 0.9 % bolus 500 mL  and laboratory tests . Pt understands and agrees. Medications  0.9 %  sodium chloride infusion (not administered)  sodium chloride 0.9 % bolus 500 mL (500 mLs Intravenous New Bag/Given  08/02/13 1354)   Patient Vitals for the past 24 hrs:  BP Temp Pulse Resp SpO2 Height Weight  08/02/13 1356 117/54 mmHg - 75 - - - -  08/02/13 1355 118/60 mmHg - 71 - - - -  08/02/13 1354 126/62 mmHg - 67 - - - -  08/02/13 1140 128/89 mmHg 98.2 F (36.8 C) 72 18 97 % 5' 6"  (1.676 m) 135 lb (61.236 kg)    Labs Review Labs Reviewed  CBC WITH DIFFERENTIAL - Abnormal; Notable for the following:    WBC 3.3 (*)    Neutro Abs 1.6 (*)    Monocytes Relative 14 (*)    All other components within normal limits  BASIC METABOLIC PANEL - Abnormal; Notable for the following:    Potassium 3.6 (*)    Glucose, Bld 113 (*)    GFR calc non Af Amer 81 (*)    All other components within normal limits  URINALYSIS, ROUTINE W REFLEX MICROSCOPIC - Abnormal; Notable for the following:    Specific Gravity, Urine <1.005 (*)    All other components within normal limits  URINE CULTURE   Imaging Review Dg Chest 2 View  08/02/2013   CLINICAL DATA:  INFLUENZA, shortness of breath, fever for 1 week  EXAM: CHEST  2 VIEW  COMPARISON:  DG CHEST 2 VIEW dated 07/09/2013; CT CHEST W/O CM dated 08/26/2012; CT CHEST W/O CM dated 03/13/2011  FINDINGS: There are chronic bilateral interstitial changes. There is no focal parenchymal opacity, pleural effusion, or pneumothorax. The heart and mediastinal contours are unremarkable.  There is degenerative disc disease of the thoracic spine.  IMPRESSION: No active cardiopulmonary disease.   Electronically Signed   By: Kathreen Devoid   On: 08/02/2013 13:28    EKG Interpretation    Date/Time:  Saturday August 02 2013 13:41:49 EST Ventricular Rate:  66 PR Interval:  202 QRS Duration: 94 QT Interval:  448 QTC  Calculation: 469 R Axis:   31 Text Interpretation:  Normal sinus rhythm Septal infarct , age undetermined Abnormal ECG When compared with ECG of 09-Jul-2013 09:36, No significant change was found Confirmed by Eulis Foster  MD, Shelaine Frie ([id number]) on 08/02/2013 3:18:46 PM            MDM   1. Viral illness    Nonspecific symptoms with recent fever, improving. Screening examination negative for acute problems. Doubt serious bacterial infection. Metabolic instability, or impending vascular collapse.  The patient appears reasonably screened and/or stabilized for discharge and I doubt any other medical condition or other [facility] requiring further screening, evaluation, or treatment in the ED at this time prior to discharge.  Nursing Notes Reviewed/ Care Coordinated, and agree without changes. Applicable Imaging Reviewed.  Interpretation of Laboratory Data incorporated into ED treatment  Plan: Home Medications- usual; Home Treatments- rest, fluids; return here if the recommended treatment, does not improve the symptoms; Recommended follow up- PCP 5 days   I personally performed the services described in this documentation, which was scribed in my presence. The recorded information has been reviewed and is accurate.      Richarda Blade, MD 08/02/13 [phone]

## 2013-08-03 LAB — URINE CULTURE
Colony Count: NO GROWTH
Culture: NO GROWTH

## 2013-08-07 ENCOUNTER — Encounter (HOSPITAL_COMMUNITY): Payer: Self-pay | Admitting: Pharmacy Technician

## 2013-08-15 NOTE — Discharge Instructions (Signed)
[name]  08/15/2013     Instructions    Activity: No Restrictions.   Diet: Resume Diet you were on at home.   Pain Medication: Tylenol if Needed.   CONTACT YOUR DOCTOR IF YOU HAVE PAIN, REDNESS IN YOUR EYE, OR DECREASED VISION.   Follow-up:  09/09/2013 @ 1:30pm   with Williams Che, MD.     Dr. Iona Hansen: [phone]     If you find that you cannot contact your physician, but feel that your signs and   Symptoms warrant a physician's attention, call the Emergency Room at   [phone]

## 2013-08-18 ENCOUNTER — Ambulatory Visit (HOSPITAL_COMMUNITY)
Admission: RE | Admit: 2013-08-18 | Discharge: 2013-08-18 | Disposition: A | Discharge status: Home / Self Care | Payer: Medicare HMO | Source: Ambulatory Visit | Attending: Ophthalmology | Admitting: Ophthalmology

## 2013-08-18 ENCOUNTER — Encounter (HOSPITAL_COMMUNITY)
Admission: RE | Disposition: A | Discharge status: Home / Self Care | Payer: Self-pay | Source: Ambulatory Visit | Attending: Ophthalmology

## 2013-08-18 DIAGNOSIS — I10 Essential (primary) hypertension: Secondary | ICD-10-CM | POA: Insufficient documentation

## 2013-08-18 DIAGNOSIS — H26499 Other secondary cataract, unspecified eye: Secondary | ICD-10-CM | POA: Insufficient documentation

## 2013-08-18 HISTORY — PX: YAG LASER APPLICATION: SHX6189

## 2013-08-18 SURGERY — TREATMENT, USING YAG LASER
Anesthesia: LOCAL | Laterality: Right

## 2013-08-18 MED ORDER — CYCLOPENTOLATE HCL 1 % OP SOLN
OPHTHALMIC | Status: AC
Start: 2013-08-18 — End: 2013-08-18
  Filled 2013-08-18: qty 2

## 2013-08-18 MED ORDER — TETRACAINE HCL 0.5 % OP SOLN: 1.0000 [drp] | OPHTHALMIC | Status: DC

## 2013-08-18 MED ORDER — APRACLONIDINE HCL 1 % OP SOLN
1.0000 [drp] | OPHTHALMIC | Status: AC
  Administered 2013-08-18 (×3): 1 [drp] via OPHTHALMIC

## 2013-08-18 MED ORDER — CYCLOPENTOLATE-PHENYLEPHRINE 0.2-1 % OP SOLN
1.0000 [drp] | OPHTHALMIC | Status: AC
  Administered 2013-08-18 (×3): 1 [drp] via OPHTHALMIC

## 2013-08-18 MED ORDER — CYCLOPENTOLATE-PHENYLEPHRINE OP SOLN OPTIME - NO CHARGE
OPHTHALMIC | Status: AC
  Filled 2013-08-18: qty 2

## 2013-08-18 MED ORDER — TETRACAINE HCL 0.5 % OP SOLN
OPHTHALMIC | Status: AC
  Filled 2013-08-18: qty 2

## 2013-08-18 MED ORDER — APRACLONIDINE HCL 1 % OP SOLN
OPHTHALMIC | Status: AC
  Filled 2013-08-18: qty 0.1

## 2013-08-18 NOTE — H&P (Signed)
I have reviewed the pre printed H&P, the patient was re-examined, and I have identified no significant interval changes in the patient's medical condition.  There is no change in the plan of care since the history and physical of record. 

## 2013-08-18 NOTE — Brief Op Note (Signed)
[name] 08/18/2013  Williams Che, MD  Yag Laser Self Test Completedyes. Procedure: Posterior Capsulotomy, right eye.  Eye Protection Worn by Staff yes. Laser In Use Sign on Door yes.  Laser: Nd:YAG Spot Size: Fixed Burst Mode: III Power Setting: 1.7 mJ/burst Position treated: posterior capsule Number of shots: 24 Total energy delivered: 40.7  mJ3 The patient tolerated the procedure without difficulty. No complications were encountered.  The patient was discharged home with the instructions to continue all her current glaucoma medications, if any.  Patient verbalizes understanding of discharge instructions yes.   Notes:moderately thickened posterior capsule noted

## 2013-08-18 NOTE — Progress Notes (Signed)
Dr Iona Hansen stated "no post op intraocular pressure needed", tetracaine order cancelled.

## 2013-08-18 NOTE — Op Note (Signed)
None required-procedure

## 2013-08-19 ENCOUNTER — Encounter (HOSPITAL_COMMUNITY): Payer: Self-pay | Admitting: Ophthalmology

## 2013-11-11 ENCOUNTER — Encounter (HOSPITAL_COMMUNITY): Payer: Self-pay

## 2013-11-11 ENCOUNTER — Other Ambulatory Visit (HOSPITAL_COMMUNITY): Payer: Self-pay | Admitting: Hematology and Oncology

## 2013-11-11 ENCOUNTER — Encounter (HOSPITAL_COMMUNITY): Payer: Medicare HMO | Attending: Hematology and Oncology

## 2013-11-11 ENCOUNTER — Encounter (HOSPITAL_COMMUNITY): Payer: Medicare HMO

## 2013-11-11 VITALS — BP 125/68 | HR 68 | Temp 98.7°F | Resp 18 | Wt 132.7 lb

## 2013-11-11 DIAGNOSIS — I10 Essential (primary) hypertension: Secondary | ICD-10-CM | POA: Insufficient documentation

## 2013-11-11 DIAGNOSIS — Z170 Estrogen receptor positive status [ER+]: Secondary | ICD-10-CM

## 2013-11-11 DIAGNOSIS — Z79890 Hormone replacement therapy (postmenopausal): Secondary | ICD-10-CM | POA: Diagnosis not present

## 2013-11-11 DIAGNOSIS — Z803 Family history of malignant neoplasm of breast: Secondary | ICD-10-CM | POA: Insufficient documentation

## 2013-11-11 DIAGNOSIS — C50919 Malignant neoplasm of unspecified site of unspecified female breast: Secondary | ICD-10-CM

## 2013-11-11 DIAGNOSIS — Z09 Encounter for follow-up examination after completed treatment for conditions other than malignant neoplasm: Secondary | ICD-10-CM | POA: Insufficient documentation

## 2013-11-11 DIAGNOSIS — Z9010 Acquired absence of unspecified breast and nipple: Secondary | ICD-10-CM | POA: Diagnosis not present

## 2013-11-11 DIAGNOSIS — Z853 Personal history of malignant neoplasm of breast: Secondary | ICD-10-CM | POA: Insufficient documentation

## 2013-11-11 DIAGNOSIS — F17200 Nicotine dependence, unspecified, uncomplicated: Secondary | ICD-10-CM | POA: Insufficient documentation

## 2013-11-11 DIAGNOSIS — Z1231 Encounter for screening mammogram for malignant neoplasm of breast: Secondary | ICD-10-CM

## 2013-11-11 DIAGNOSIS — Z86711 Personal history of pulmonary embolism: Secondary | ICD-10-CM | POA: Diagnosis not present

## 2013-11-11 LAB — CBC WITH DIFFERENTIAL/PLATELET
Basophils Absolute: 0 10*3/uL (ref 0.0–0.1)
Basophils Relative: 0 % (ref 0–1)
EOS ABS: 0.2 10*3/uL (ref 0.0–0.7)
HEMATOCRIT: 40.2 % (ref 36.0–46.0)
Hemoglobin: 13.6 g/dL (ref 12.0–15.0)
LYMPHS ABS: 2 10*3/uL (ref 0.7–4.0)
MCH: 30 pg (ref 26.0–34.0)
MCHC: 33.8 g/dL (ref 30.0–36.0)
MCV: 88.7 fL (ref 78.0–100.0)
MONO ABS: 0.5 10*3/uL (ref 0.1–1.0)
Monocytes Relative: 7 % (ref 3–12)
Neutro Abs: 4.7 10*3/uL (ref 1.7–7.7)
Neutrophils Relative %: 64 % (ref 43–77)
Platelets: 172 10*3/uL (ref 150–400)
RBC: 4.53 MIL/uL (ref 3.87–5.11)
RDW: 13.8 % (ref 11.5–15.5)
WBC: 7.4 10*3/uL (ref 4.0–10.5)

## 2013-11-11 LAB — COMPREHENSIVE METABOLIC PANEL
ALT: 9 U/L (ref 0–35)
AST: 18 U/L (ref 0–37)
Albumin: 3.6 g/dL (ref 3.5–5.2)
Alkaline Phosphatase: 66 U/L (ref 39–117)
BUN: 11 mg/dL (ref 6–23)
CALCIUM: 9.3 mg/dL (ref 8.4–10.5)
CO2: 28 meq/L (ref 19–32)
Chloride: 101 mEq/L (ref 96–112)
Creatinine, Ser: 0.83 mg/dL (ref 0.50–1.10)
GFR, EST AFRICAN AMERICAN: 79 mL/min — AB (ref >90)
GFR, EST NON AFRICAN AMERICAN: 68 mL/min — AB (ref >90)
GLUCOSE: 143 mg/dL — AB (ref 70–99)
Potassium: 3.9 mEq/L (ref 3.7–5.3)
Sodium: 140 mEq/L (ref 137–147)
TOTAL PROTEIN: 7.5 g/dL (ref 6.0–8.3)
Total Bilirubin: 0.3 mg/dL (ref 0.3–1.2)

## 2013-11-11 NOTE — Progress Notes (Signed)
[place]  OFFICE PROGRESS Jolee Ewing, MD [address] [place] [address]  DIAGNOSIS: Infiltrating ductal carcinoma of breast - Plan: MM Digital Diagnostic Unilat L, DG Bone Density, CANCELED: HM DEXA SCAN  Infiltrating ductal carcinoma of breast, unspecified laterality - Plan: CBC with Differential, Comprehensive metabolic panel, CEA, Cancer antigen 27.29  Chief Complaint  Patient presents with  . Breast Cancer    CURRENT THERAPY: Anastrozole 1 mg daily  INTERVAL HISTORY: [name] 73 y.o. female returns for followup of stage I right breast cancer status post right modified radical mastectomy followed by anastrozole therapy started on 02/16/2009.  She continues to do well with no hot flashes, vaginal dryness, joint aches or pains, lower extremity swelling or redness, PND, orthopnea, palpitations, skin rash, headache, nausea, vomiting, diarrhea, constipation, urinary incontinence, or seizures  MEDICAL HISTORY: Past Medical History  Diagnosis Date  . Hypertension   . Tendonitis 07/2010    shot of cortisone  . Embolism - blood clot     left lung  . Breast cancer     rt dx 2010; mastectomy only  . Breast CA 01/16/2011  . Infiltrating ductal carcinoma of breast 01/16/2011    Started Arimidex on 02/24/2009. Stage IB (T1b N0 M0) grade 1 infiltrating ductal carcinoma of the right breast, status post right modified radical mastectomy on 01/22/2009 for a 10 mm cancer, ER +100%, PR +98%, KI-67 marker was 9%, HER-2 negative, no LVI identified, and 11 lymph nodes were negative, all margins were clear. She had some associated DCIS, is now on Arimidex and she will take that for    INTERIM HISTORY: has DEQUERVAIN'S; Tendonitis; Infiltrating ductal carcinoma of breast; and Embolism - blood clot on her problem list.   Stage IB (T1 B., N0, M0) grade 1 infiltrating ductal carcinoma the right breast status post right modified  radical mastectomy 11 negative nodes on 01/22/2009 for 10 mm cancer. Estrogen receptor 100%, progesterone receptor 98%, Ki-67 marker low at 9% and HER-2/neu was not amplified. Margins were clear.  Associated DCIS. 02/24/2009 started anastrozole with plans to treat for 5 years.    ALLERGIES:  has No Known Allergies.  MEDICATIONS: has a current medication list which includes the following prescription(s): amlodipine, anastrozole, aspirin-sod bicarb-citric acid, calcium carb-cholecalciferol, vitamin d, ibuprofen, lovastatin, metoprolol tartrate, naphazoline-glycerin, nitroglycerin, and omeprazole.  SURGICAL HISTORY:  Past Surgical History  Procedure Laterality Date  . Appendectomy  1974    blood clot in lung  . Abdominal hysterectomy  1987  . Breast biopsy  1968    left  . Tubal ligation  1972  . Nasal sinus surgery  2002  . Mastectomy modified radical  01/22/2009    rt  . Tear duct probing    . Yag laser application Right 5/73/2202    Procedure: YAG LASER APPLICATION;  Surgeon: Williams Che, MD;  Location: [facility];  Service: Ophthalmology;  Laterality: Right;    FAMILY HISTORY: family history includes Breast cancer in her maternal grandmother and sister; Colon cancer in her daughter.  SOCIAL HISTORY:  reports that she has been smoking Cigarettes.  She has been smoking about 0.50 packs per day. She has never used smokeless tobacco. She reports that she does not drink alcohol.  REVIEW OF SYSTEMS:  Other than that discussed above is noncontributory.  PHYSICAL EXAMINATION: ECOG PERFORMANCE STATUS: 0 - Asymptomatic  Blood pressure 125/68, pulse 68, temperature 98.7 F (37.1 C), temperature source  Oral, resp. rate 18, weight 132 lb 11.2 oz (60.192 kg).  GENERAL:alert, no distress and comfortable SKIN: skin color, texture, turgor are normal, no rashes or significant lesions EYES: PERLA; Conjunctiva are pink and non-injected, sclera clear SINUSES: No redness or tenderness over maxillary  or ethmoid sinuses OROPHARYNX:no exudate, no erythema on lips, buccal mucosa, or tongue. NECK: supple, thyroid normal size, non-tender, without nodularity. No masses CHEST: Increased AP diameter. Status post right mastectomy with no subcutaneous nodules. Left breast without mass. LYMPH:  no palpable lymphadenopathy in the cervical, axillary or inguinal LUNGS: clear to auscultation and percussion with normal breathing effort HEART: regular rate & rhythm and no murmurs. ABDOMEN:abdomen soft, non-tender and normal bowel sounds MUSCULOSKELETAL:no cyanosis of digits and no clubbing. Range of motion normal.  NEURO: alert & oriented x 3 with fluent speech, no focal motor/sensory deficits   LABORATORY DATA: No visits with results within 30 Day(s) from this visit. Latest known visit with results is:  Admission on 08/02/2013, Discharged on 08/02/2013  Component Date Value Ref Range Status  . WBC 08/02/2013 3.3* 4.0 - 10.5 K/uL Final  . RBC 08/02/2013 4.36  3.87 - 5.11 MIL/uL Final  . Hemoglobin 08/02/2013 13.3  12.0 - 15.0 g/dL Final  . HCT 08/02/2013 38.2  36.0 - 46.0 % Final  . MCV 08/02/2013 87.6  78.0 - 100.0 fL Final  . MCH 08/02/2013 30.5  26.0 - 34.0 pg Final  . MCHC 08/02/2013 34.8  30.0 - 36.0 g/dL Final  . RDW 08/02/2013 12.9  11.5 - 15.5 % Final  . Platelets 08/02/2013 151  150 - 400 K/uL Final  . Neutrophils Relative % 08/02/2013 47  43 - 77 % Final  . Neutro Abs 08/02/2013 1.6* 1.7 - 7.7 K/uL Final  . Lymphocytes Relative 08/02/2013 38  12 - 46 % Final  . Lymphs Abs 08/02/2013 1.3  0.7 - 4.0 K/uL Final  . Monocytes Relative 08/02/2013 14* 3 - 12 % Final  . Monocytes Absolute 08/02/2013 0.5  0.1 - 1.0 K/uL Final  . Eosinophils Relative 08/02/2013 2  0 - 5 % Final  . Eosinophils Absolute 08/02/2013 0.1  0.0 - 0.7 K/uL Final  . Basophils Relative 08/02/2013 1  0 - 1 % Final  . Basophils Absolute 08/02/2013 0.0  0.0 - 0.1 K/uL Final  . Sodium 08/02/2013 138  137 - 147 mEq/L Final     Please note change in reference range.  . Potassium 08/02/2013 3.6* 3.7 - 5.3 mEq/L Final   Please note change in reference range.  . Chloride 08/02/2013 98  96 - 112 mEq/L Final  . CO2 08/02/2013 28  19 - 32 mEq/L Final  . Glucose, Bld 08/02/2013 113* 70 - 99 mg/dL Final  . BUN 08/02/2013 8  6 - 23 mg/dL Final  . Creatinine, Ser 08/02/2013 0.77  0.50 - 1.10 mg/dL Final  . Calcium 08/02/2013 9.0  8.4 - 10.5 mg/dL Final  . GFR calc non Af Amer 08/02/2013 81* >90 mL/min Final  . GFR calc Af Amer 08/02/2013 >90  >90 mL/min Final   Comment: (NOTE)                          The eGFR has been calculated using the CKD EPI equation.                          This calculation has not been validated in all clinical  situations.                          eGFR's persistently <90 mL/min signify possible Chronic Kidney                          Disease.  . Color, Urine 08/02/2013 YELLOW  YELLOW Final  . APPearance 08/02/2013 CLEAR  CLEAR Final  . Specific Gravity, Urine 08/02/2013 <1.005* 1.005 - 1.030 Final  . pH 08/02/2013 6.5  5.0 - 8.0 Final  . Glucose, UA 08/02/2013 NEGATIVE  NEGATIVE mg/dL Final  . Hgb urine dipstick 08/02/2013 NEGATIVE  NEGATIVE Final  . Bilirubin Urine 08/02/2013 NEGATIVE  NEGATIVE Final  . Ketones, ur 08/02/2013 NEGATIVE  NEGATIVE mg/dL Final  . Protein, ur 08/02/2013 NEGATIVE  NEGATIVE mg/dL Final  . Urobilinogen, UA 08/02/2013 0.2  0.0 - 1.0 mg/dL Final  . Nitrite 08/02/2013 NEGATIVE  NEGATIVE Final  . Leukocytes, UA 08/02/2013 NEGATIVE  NEGATIVE Final   MICROSCOPIC NOT DONE ON URINES WITH NEGATIVE PROTEIN, BLOOD, LEUKOCYTES, NITRITE, OR GLUCOSE <1000 mg/dL.  Marland Kitchen Specimen Description 08/02/2013 URINE, CLEAN CATCH   Final  . Special Requests 08/02/2013 NONE   Final  . Culture  Setup Time 08/02/2013    Final                   Value:08/02/2013 21:00                         Performed at [facility]  . Colony Count 08/02/2013    Final                   Value:NO GROWTH                          Performed at [facility]  . Culture 08/02/2013    Final                   Value:NO GROWTH                         Performed at [facility]  . Report Status 08/02/2013 08/03/2013 FINAL   Final    PATHOLOGY: ER/PR positive ductal carcinoma  Urinalysis    Component Value Date/Time   COLORURINE YELLOW 08/02/2013 1440   APPEARANCEUR CLEAR 08/02/2013 1440   LABSPEC <1.005* 08/02/2013 1440   PHURINE 6.5 08/02/2013 1440   GLUCOSEU NEGATIVE 08/02/2013 1440   HGBUR NEGATIVE 08/02/2013 1440   BILIRUBINUR NEGATIVE 08/02/2013 1440   KETONESUR NEGATIVE 08/02/2013 1440   PROTEINUR NEGATIVE 08/02/2013 1440   UROBILINOGEN 0.2 08/02/2013 1440   NITRITE NEGATIVE 08/02/2013 1440   LEUKOCYTESUR NEGATIVE 08/02/2013 1440    RADIOGRAPHIC STUDIES: MM Digital Screening Unilat L Status: Final result            Study Result    *RADIOLOGY REPORT*  Clinical Data: Screening. Right mastectomy.  LEFT DIGITAL SCREENING MAMMOGRAM WITH CAD  Comparison: None.  FINDINGS:  ACR Breast Density Category: 2: There is a scattered fibroglandular  pattern.  The patient has had a right mastectomy. No suspicious masses,  architectural distortion, or calcifications are present.  Images were processed with CAD.  IMPRESSION:  No evidence of malignancy. Screening mammography is recommended in  one year.  RECOMMENDATION:  Screening mammogram in one year. (Code:SM-B-01Y)  BI-RADS CATEGORY  1: Negative.  Original Report Authenticated By: Nolon Nations, M.D.          Result Notes    Notes Recorded by Kathleene Hazel, RN on 01/03/2013 at 10:35 AM Message left on answering machine as below. ------  Notes Recorded by      ASSESSMENT:  #1. Stage IB (T1 B., N0, M0) grade 1 infiltrating ductal carcinoma the right breast status post right modified radical mastectomy 11 negative nodes on 01/22/2009 for 10 mm cancer. Estrogen receptor 100%, progesterone receptor 98%, Ki-67 marker low at 9%  and HER-2/neu was not amplified. Margins were clear.  Associated DCIS. 02/24/2009 started anastrozole with plans to treat for 5 years which is July 2015.   #2. History of pulmonary embolism at appendectomy 1974, no evidence of thrombotic disorder since.     PLAN:  #1. Continue anastrozole 1 mg daily at least through July 2015. Patient was given the option to continue the drug although there is no data to indicate that taking an aromatase inhibitor for greater than 5 years is beneficial. She appears to tolerate the drug well but at the same time she has low risk disease and if she desired to stop the agent, that would be the conventional wisdom. #2. DEXA scan and mammogram in June 2015. #3. Followup in July 2015 for further discussion regarding discontinuation or continuing anastrozole with CBC, chem profile, CEA, and [place] 2729 at that time.   All questions were answered. The patient knows to call the clinic with any problems, questions or concerns. We can certainly see the patient much sooner if necessary.   I spent 25 minutes counseling the patient face to face. The total time spent in the appointment was 30 minutes.    Farrel Gobble, MD 11/11/2013 2:34 PM

## 2013-11-11 NOTE — Progress Notes (Signed)
[name] presented for labwork. Labs per MD order drawn via Peripheral Line 23 gauge needle inserted in Left AC  Good blood return present. Procedure without incident.  Needle removed intact. Patient tolerated procedure well.

## 2013-11-11 NOTE — Patient Instructions (Addendum)
[facility] Discharge Instructions  RECOMMENDATIONS MADE BY THE CONSULTANT AND ANY TEST RESULTS WILL BE SENT TO YOUR REFERRING PHYSICIAN.  EXAM FINDINGS BY THE PHYSICIAN TODAY AND SIGNS OR SYMPTOMS TO REPORT TO CLINIC OR PRIMARY PHYSICIAN: Exam and findings as discussed by Dr. Barnet Glasgow.  You are doing well. Report any new lumps, bone pain, shortness of breath or other symptoms.  Will get you scheduled for bone density and mammogram in June and see you afterwards.  Will decide then if we will continue the Arimidex. Will check some labs today and if there are any issues we will contact you.  MEDICATIONS PRESCRIBED:  Continue arimidex.  INSTRUCTIONS/FOLLOW-UP: Follow-up in July after scans.  Thank you for choosing [facility] to provide your oncology and hematology care.  To afford each patient quality time with our providers, please arrive at least 15 minutes before your scheduled appointment time.  With your help, our goal is to use those 15 minutes to complete the necessary work-up to ensure our physicians have the information they need to help with your evaluation and healthcare recommendations.    Effective January 1st, 2014, we ask that you re-schedule your appointment with our physicians should you arrive 10 or more minutes late for your appointment.  We strive to give you quality time with our providers, and arriving late affects you and other patients whose appointments are after yours.    Again, thank you for choosing [facility].  Our hope is that these requests will decrease the amount of time that you wait before being seen by our physicians.       _____________________________________________________________  Should you have questions after your visit to [facility], please contact our office at [phone] between the hours of 8:30 a.m. and 5:00 p.m.  Voicemails left after 4:30 p.m. will not be returned until the  following business day.  For prescription refill requests, have your pharmacy contact our office with your prescription refill request.

## 2013-11-12 LAB — CEA: CEA: 1.9 ng/mL (ref 0.0–5.0)

## 2013-11-12 LAB — CANCER ANTIGEN 27.29: CA 27.29: 40 U/mL — ABNORMAL HIGH (ref 0–39)

## 2014-01-12 ENCOUNTER — Ambulatory Visit (HOSPITAL_COMMUNITY)
Admission: RE | Admit: 2014-01-12 | Discharge: 2014-01-12 | Disposition: A | Discharge status: Home / Self Care | Payer: Medicare HMO | Source: Ambulatory Visit | Attending: Hematology and Oncology | Admitting: Hematology and Oncology

## 2014-01-12 DIAGNOSIS — C50919 Malignant neoplasm of unspecified site of unspecified female breast: Secondary | ICD-10-CM | POA: Insufficient documentation

## 2014-01-12 DIAGNOSIS — Z1231 Encounter for screening mammogram for malignant neoplasm of breast: Secondary | ICD-10-CM | POA: Insufficient documentation

## 2014-01-25 DIAGNOSIS — M8580 Other specified disorders of bone density and structure, unspecified site: Secondary | ICD-10-CM | POA: Insufficient documentation

## 2014-02-10 ENCOUNTER — Encounter (HOSPITAL_BASED_OUTPATIENT_CLINIC_OR_DEPARTMENT_OTHER): Payer: Medicare HMO

## 2014-02-10 ENCOUNTER — Encounter (HOSPITAL_COMMUNITY): Payer: Medicare HMO | Attending: Hematology and Oncology

## 2014-02-10 ENCOUNTER — Encounter (HOSPITAL_COMMUNITY): Payer: Self-pay

## 2014-02-10 VITALS — BP 134/41 | HR 67 | Temp 98.9°F | Resp 18 | Wt 135.8 lb

## 2014-02-10 DIAGNOSIS — M169 Osteoarthritis of hip, unspecified: Secondary | ICD-10-CM

## 2014-02-10 DIAGNOSIS — M8580 Other specified disorders of bone density and structure, unspecified site: Secondary | ICD-10-CM

## 2014-02-10 DIAGNOSIS — M949 Disorder of cartilage, unspecified: Secondary | ICD-10-CM | POA: Diagnosis not present

## 2014-02-10 DIAGNOSIS — M899 Disorder of bone, unspecified: Secondary | ICD-10-CM | POA: Diagnosis not present

## 2014-02-10 DIAGNOSIS — Z9010 Acquired absence of unspecified breast and nipple: Secondary | ICD-10-CM

## 2014-02-10 DIAGNOSIS — Z86711 Personal history of pulmonary embolism: Secondary | ICD-10-CM

## 2014-02-10 DIAGNOSIS — C50919 Malignant neoplasm of unspecified site of unspecified female breast: Secondary | ICD-10-CM | POA: Diagnosis present

## 2014-02-10 DIAGNOSIS — M810 Age-related osteoporosis without current pathological fracture: Secondary | ICD-10-CM

## 2014-02-10 DIAGNOSIS — M1610 Unilateral primary osteoarthritis, unspecified hip: Secondary | ICD-10-CM

## 2014-02-10 LAB — CBC WITH DIFFERENTIAL/PLATELET
Basophils Absolute: 0 10*3/uL (ref 0.0–0.1)
Basophils Relative: 0 % (ref 0–1)
EOS PCT: 2 % (ref 0–5)
Eosinophils Absolute: 0.1 10*3/uL (ref 0.0–0.7)
HCT: 37.8 % (ref 36.0–46.0)
Hemoglobin: 13 g/dL (ref 12.0–15.0)
LYMPHS PCT: 26 % (ref 12–46)
Lymphs Abs: 1.7 10*3/uL (ref 0.7–4.0)
MCH: 31.2 pg (ref 26.0–34.0)
MCHC: 34.4 g/dL (ref 30.0–36.0)
MCV: 90.6 fL (ref 78.0–100.0)
MONO ABS: 0.5 10*3/uL (ref 0.1–1.0)
Monocytes Relative: 8 % (ref 3–12)
Neutro Abs: 4.1 10*3/uL (ref 1.7–7.7)
Neutrophils Relative %: 64 % (ref 43–77)
Platelets: 174 10*3/uL (ref 150–400)
RBC: 4.17 MIL/uL (ref 3.87–5.11)
RDW: 13.2 % (ref 11.5–15.5)
WBC: 6.4 10*3/uL (ref 4.0–10.5)

## 2014-02-10 LAB — COMPREHENSIVE METABOLIC PANEL
ALT: 9 U/L (ref 0–35)
AST: 16 U/L (ref 0–37)
Albumin: 3.7 g/dL (ref 3.5–5.2)
Alkaline Phosphatase: 65 U/L (ref 39–117)
Anion gap: 11 (ref 5–15)
BUN: 9 mg/dL (ref 6–23)
CALCIUM: 9.5 mg/dL (ref 8.4–10.5)
CO2: 29 mEq/L (ref 19–32)
Chloride: 100 mEq/L (ref 96–112)
Creatinine, Ser: 0.79 mg/dL (ref 0.50–1.10)
GFR, EST NON AFRICAN AMERICAN: 80 mL/min — AB (ref >90)
GLUCOSE: 142 mg/dL — AB (ref 70–99)
Potassium: 3.8 mEq/L (ref 3.7–5.3)
Sodium: 140 mEq/L (ref 137–147)
Total Bilirubin: 0.3 mg/dL (ref 0.3–1.2)
Total Protein: 7.4 g/dL (ref 6.0–8.3)

## 2014-02-10 NOTE — Progress Notes (Signed)
[facility]  OFFICE PROGRESS Jolee Ewing, MD [address] [place] [address]  DIAGNOSIS: Osteopenia - Plan: CEA, Cancer antigen 27.29, CEA, Cancer antigen 27.29  Infiltrating ductal carcinoma of breast, unspecified laterality - Plan: CBC with Differential, Comprehensive metabolic panel, MM Digital Diagnostic Unilat R, CBC with Differential, Comprehensive metabolic panel, CBC with Differential, Comprehensive metabolic panel, CEA, Cancer antigen 27.29  Chief Complaint  Patient presents with  . Breast Cancer    CURRENT THERAPY: Anastrozole 1 mg daily  INTERVAL HISTORY: [name] 73 y.o. female returns for followup of stage I breast cancer, status post right modified radical mastectomy followed by anastrozole therapy started on 02/16/2014.  Aside from left hip discomfort, she is doing well with good appetite. She denies any nausea, vomiting, diarrhea, constipation, dysuria, hematuria, incontinence, lower 70 swelling or redness, PND, orthopnea, palpitations, abnormalities on self breast examination, skin rash, hot flashes, vaginal dryness, headache, or seizures. She will complete anastrozole later this month.  MEDICAL HISTORY: Past Medical History  Diagnosis Date  . Hypertension   . Tendonitis 07/2010    shot of cortisone  . Embolism - blood clot     left lung  . Breast cancer     rt dx 2010; mastectomy only  . Breast CA 01/16/2011  . Infiltrating ductal carcinoma of breast 01/16/2011    Started Arimidex on 02/24/2009. Stage IB (T1b N0 M0) grade 1 infiltrating ductal carcinoma of the right breast, status post right modified radical mastectomy on 01/22/2009 for a 10 mm cancer, ER +100%, PR +98%, KI-67 marker was 9%, HER-2 negative, no LVI identified, and 11 lymph nodes were negative, all margins were clear. She had some associated DCIS, is now on Arimidex and she will take that for    INTERIM HISTORY: has DEQUERVAIN'S;  Tendonitis; Infiltrating ductal carcinoma of breast; Embolism - blood clot; and Osteopenia on her problem list.   Stage IB (T1 B., N0, M0) grade 1 infiltrating ductal carcinoma the right breast status post right modified radical mastectomy 11 negative nodes on 01/22/2009 for 10 mm cancer. Estrogen receptor 100%, progesterone receptor 98%, Ki-67 marker low at 9% and HER-2/neu was not amplified. Margins were clear. Associated DCIS. 02/24/2009 started anastrozole with plans to treat for 5 years.   ALLERGIES:  is allergic to pollen extract.  MEDICATIONS: has a current medication list which includes the following prescription(s): amlodipine, anastrozole, aspirin-sod bicarb-citric acid, calcium carb-cholecalciferol, vitamin d, ibuprofen, lovastatin, metoprolol tartrate, naphazoline-glycerin, nitroglycerin, and omeprazole.  SURGICAL HISTORY:  Past Surgical History  Procedure Laterality Date  . Appendectomy  1974    blood clot in lung  . Abdominal hysterectomy  1987  . Breast biopsy  1968    left  . Tubal ligation  1972  . Nasal sinus surgery  2002  . Mastectomy modified radical  01/22/2009    rt  . Tear duct probing    . Yag laser application Right 0/24/0973    Procedure: YAG LASER APPLICATION;  Surgeon: Williams Che, MD;  Location: [facility];  Service: Ophthalmology;  Laterality: Right;    FAMILY HISTORY: family history includes Breast cancer in her maternal grandmother and sister; Colon cancer in her daughter.  SOCIAL HISTORY:  reports that she has been smoking Cigarettes.  She has been smoking about 0.50 packs per day. She has never used smokeless tobacco. She reports that she does not drink alcohol.  REVIEW OF SYSTEMS:  Other than that discussed  above is noncontributory.  PHYSICAL EXAMINATION: ECOG PERFORMANCE STATUS: 1 - Symptomatic but completely ambulatory  Blood pressure 134/41, pulse 67, temperature 98.9 F (37.2 C), temperature source Oral, resp. rate 18, weight 135 lb 12.8 oz  (61.598 kg).  GENERAL:alert, no distress and comfortable SKIN: skin color, texture, turgor are normal, no rashes or significant lesions EYES: PERLA; Conjunctiva are pink and non-injected, sclera clear SINUSES: No redness or tenderness over maxillary or ethmoid sinuses OROPHARYNX:no exudate, no erythema on lips, buccal mucosa, or tongue. NECK: supple, thyroid normal size, non-tender, without nodularity. No masses CHEST: Status post left mastectomy with no subcutaneous nodules. Right breast without mass. LYMPH:  no palpable lymphadenopathy in the cervical, axillary or inguinal LUNGS: clear to auscultation and percussion with normal breathing effort HEART: regular rate & rhythm and no murmurs. ABDOMEN:abdomen soft, non-tender and normal bowel sounds MUSCULOSKELETAL:no cyanosis of digits and no clubbing. Range of motion normal. Pain in the left hip an internal rotation. Left upper extremity without lymphedema. NEURO: alert & oriented x 3 with fluent speech, no focal motor/sensory deficits   LABORATORY DATA: Office Visit on 02/10/2014  Component Date Value Ref Range Status  . CEA 02/10/2014 1.6  0.0 - 5.0 ng/mL Final   Performed at [facility]  . CA 27.29 02/10/2014 32  0 - 39 U/mL Final   Performed at [facility]  . WBC 02/10/2014 6.4  4.0 - 10.5 K/uL Final  . RBC 02/10/2014 4.17  3.87 - 5.11 MIL/uL Final  . Hemoglobin 02/10/2014 13.0  12.0 - 15.0 g/dL Final  . HCT 02/10/2014 37.8  36.0 - 46.0 % Final  . MCV 02/10/2014 90.6  78.0 - 100.0 fL Final  . MCH 02/10/2014 31.2  26.0 - 34.0 pg Final  . MCHC 02/10/2014 34.4  30.0 - 36.0 g/dL Final  . RDW 02/10/2014 13.2  11.5 - 15.5 % Final  . Platelets 02/10/2014 174  150 - 400 K/uL Final  . Neutrophils Relative % 02/10/2014 64  43 - 77 % Final  . Neutro Abs 02/10/2014 4.1  1.7 - 7.7 K/uL Final  . Lymphocytes Relative 02/10/2014 26  12 - 46 % Final  . Lymphs Abs 02/10/2014 1.7  0.7 - 4.0 K/uL Final  . Monocytes Relative  02/10/2014 8  3 - 12 % Final  . Monocytes Absolute 02/10/2014 0.5  0.1 - 1.0 K/uL Final  . Eosinophils Relative 02/10/2014 2  0 - 5 % Final  . Eosinophils Absolute 02/10/2014 0.1  0.0 - 0.7 K/uL Final  . Basophils Relative 02/10/2014 0  0 - 1 % Final  . Basophils Absolute 02/10/2014 0.0  0.0 - 0.1 K/uL Final  . Sodium 02/10/2014 140  137 - 147 mEq/L Final  . Potassium 02/10/2014 3.8  3.7 - 5.3 mEq/L Final  . Chloride 02/10/2014 100  96 - 112 mEq/L Final  . CO2 02/10/2014 29  19 - 32 mEq/L Final  . Glucose, Bld 02/10/2014 142* 70 - 99 mg/dL Final  . BUN 02/10/2014 9  6 - 23 mg/dL Final  . Creatinine, Ser 02/10/2014 0.79  0.50 - 1.10 mg/dL Final  . Calcium 02/10/2014 9.5  8.4 - 10.5 mg/dL Final  . Total Protein 02/10/2014 7.4  6.0 - 8.3 g/dL Final  . Albumin 02/10/2014 3.7  3.5 - 5.2 g/dL Final  . AST 02/10/2014 16  0 - 37 U/L Final  . ALT 02/10/2014 9  0 - 35 U/L Final  . Alkaline Phosphatase 02/10/2014 65  39 - 117  U/L Final  . Total Bilirubin 02/10/2014 0.3  0.3 - 1.2 mg/dL Final  . GFR calc non Af Amer 02/10/2014 80* >90 mL/min Final  . GFR calc Af Amer 02/10/2014 >90  >90 mL/min Final   Comment: (NOTE)                          The eGFR has been calculated using the CKD EPI equation.                          This calculation has not been validated in all clinical situations.                          eGFR's persistently <90 mL/min signify possible Chronic Kidney                          Disease.  . Anion gap 02/10/2014 11  5 - 15 Final    PATHOLOGY: No new pathology.  Urinalysis    Component Value Date/Time   COLORURINE YELLOW 08/02/2013 1440   APPEARANCEUR CLEAR 08/02/2013 1440   LABSPEC <1.005* 08/02/2013 1440   PHURINE 6.5 08/02/2013 1440   GLUCOSEU NEGATIVE 08/02/2013 1440   HGBUR NEGATIVE 08/02/2013 1440   BILIRUBINUR NEGATIVE 08/02/2013 1440   KETONESUR NEGATIVE 08/02/2013 1440   PROTEINUR NEGATIVE 08/02/2013 1440   UROBILINOGEN 0.2 08/02/2013 1440   NITRITE NEGATIVE 08/02/2013 1440    LEUKOCYTESUR NEGATIVE 08/02/2013 1440    RADIOGRAPHIC STUDIES:    Mm Digital Screening Unilat L  01/13/2014   CLINICAL DATA:  Screening.  EXAM: DIGITAL SCREENING UNILATERAL LEFT MAMMOGRAM WITH CAD  COMPARISON:  Previous exam(s).  ACR Breast Density Category b: There are scattered areas of fibroglandular density.  FINDINGS: There are no findings suspicious for malignancy. Images were processed with CAD.  IMPRESSION: No mammographic evidence of malignancy. A result letter of this screening mammogram will be mailed directly to the patient.  RECOMMENDATION: Screening mammogram in one year. (Code:SM-B-01Y)  BI-RADS CATEGORY  1: Negative.   Electronically Signed   By: Abelardo Diesel M.D.   On: 01/13/2014 08:40    ASSESSMENT:  #1. Stage IB (T1 B., N0, M0) grade 1 infiltrating ductal carcinoma the right breast status post right modified radical mastectomy 11 negative nodes on 01/22/2009 for 10 mm cancer. Estrogen receptor 100%, progesterone receptor 98%, Ki-67 marker low at 9% and HER-2/neu was not amplified. Margins were clear. Associated DCIS. 02/24/2009 started anastrozole with plans to treat for 5 years which is July 2015.  #2. History of pulmonary embolism at appendectomy 1974, no evidence of thrombotic disorder since. #3. Degenerative joint disease left hip.       PLAN:  #1. Discontinue anastrozole after current supply is exhausted at the end of the month. #2.9 repeat unilateral mammogram in June of 2016. #3. Followup in one year with CBC, chem profile, CEA, CA 27-29.   All questions were answered. The patient knows to call the clinic with any problems, questions or concerns. We can certainly see the patient much sooner if necessary.   I spent 25 minutes counseling the patient face to face. The total time spent in the appointment was 30 minutes.    Doroteo Bradford, MD 02/11/2014 6:41 AM  DISCLAIMER:  This note was dictated with voice recognition software.  Similar sounding words can  inadvertently be transcribed inaccurately and may  not be corrected upon review.

## 2014-02-10 NOTE — Patient Instructions (Signed)
[facility] Discharge Instructions  RECOMMENDATIONS MADE BY THE CONSULTANT AND ANY TEST RESULTS WILL BE SENT TO YOUR REFERRING PHYSICIAN.  EXAM FINDINGS BY THE PHYSICIAN TODAY AND SIGNS OR SYMPTOMS TO REPORT TO CLINIC OR PRIMARY PHYSICIAN: Exam and findings as discussed by Dr. Barnet Glasgow.  Report any new lumps, bone pain, shortness of breath or other symptoms.  MEDICATIONS PRESCRIBED:  none  INSTRUCTIONS/FOLLOW-UP: Follow-up with mammogram in June 2016 and office visit in July 2016.  Thank you for choosing [facility] to provide your oncology and hematology care.  To afford each patient quality time with our providers, please arrive at least 15 minutes before your scheduled appointment time.  With your help, our goal is to use those 15 minutes to complete the necessary work-up to ensure our physicians have the information they need to help with your evaluation and healthcare recommendations.    Effective January 1st, 2014, we ask that you re-schedule your appointment with our physicians should you arrive 10 or more minutes late for your appointment.  We strive to give you quality time with our providers, and arriving late affects you and other patients whose appointments are after yours.    Again, thank you for choosing [facility].  Our hope is that these requests will decrease the amount of time that you wait before being seen by our physicians.       _____________________________________________________________  Should you have questions after your visit to [facility], please contact our office at [phone] between the hours of 8:30 a.m. and 4:30 p.m.  Voicemails left after 4:30 p.m. will not be returned until the following business day.  For prescription refill requests, have your pharmacy contact our office with your prescription refill request.    _______________________________________________________________  We hope  that we have given you very good care.  You may receive a patient satisfaction survey in the mail, please complete it and return it as soon as possible.  We value your feedback!  _______________________________________________________________  Have you asked about our STAR program?  STAR stands for Survivorship Training and Rehabilitation, and this is a nationally recognized cancer care program that focuses on survivorship and rehabilitation.  Cancer and cancer treatments may cause problems, such as, pain, making you feel tired and keeping you from doing the things that you need or want to do. Cancer rehabilitation can help. Our goal is to reduce these troubling effects and help you have the best quality of life possible.  You may receive a survey from a nurse that asks questions about your current state of health.  Based on the survey results, all eligible patients will be referred to the [facility] program for an evaluation so we can better serve you!  A frequently asked questions sheet is available upon request.

## 2014-02-10 NOTE — Progress Notes (Signed)
[name] presented for labwork. Labs per MD order drawn via Peripheral Line 23 gauge needle inserted in right AC  Good blood return present. Procedure without incident.  Needle removed intact. Patient tolerated procedure well.

## 2014-02-11 LAB — CANCER ANTIGEN 27.29: CA 27.29: 32 U/mL (ref 0–39)

## 2014-02-11 LAB — CEA: CEA: 1.6 ng/mL (ref 0.0–5.0)

## 2014-02-12 ENCOUNTER — Encounter: Payer: Self-pay | Admitting: Hematology and Oncology

## 2014-09-07 ENCOUNTER — Ambulatory Visit (HOSPITAL_COMMUNITY)
Admission: RE | Admit: 2014-09-07 | Discharge: 2014-09-07 | Disposition: A | Discharge status: Home / Self Care | Payer: Medicare HMO | Source: Ambulatory Visit | Attending: Internal Medicine | Admitting: Internal Medicine

## 2014-09-07 ENCOUNTER — Other Ambulatory Visit (HOSPITAL_COMMUNITY): Payer: Self-pay | Admitting: Internal Medicine

## 2014-09-07 DIAGNOSIS — M545 Low back pain: Secondary | ICD-10-CM | POA: Diagnosis not present

## 2014-09-07 DIAGNOSIS — M549 Dorsalgia, unspecified: Secondary | ICD-10-CM

## 2014-09-07 DIAGNOSIS — E119 Type 2 diabetes mellitus without complications: Secondary | ICD-10-CM | POA: Diagnosis not present

## 2014-09-16 ENCOUNTER — Emergency Department (HOSPITAL_COMMUNITY)
Admission: EM | Admit: 2014-09-16 | Discharge: 2014-09-16 | Disposition: A | Discharge status: Home / Self Care | Payer: Medicare HMO | Attending: Emergency Medicine | Admitting: Emergency Medicine

## 2014-09-16 ENCOUNTER — Emergency Department (HOSPITAL_COMMUNITY): Payer: Medicare HMO

## 2014-09-16 ENCOUNTER — Encounter (HOSPITAL_COMMUNITY): Payer: Self-pay | Admitting: *Deleted

## 2014-09-16 DIAGNOSIS — M5136 Other intervertebral disc degeneration, lumbar region: Secondary | ICD-10-CM | POA: Diagnosis not present

## 2014-09-16 DIAGNOSIS — Z79899 Other long term (current) drug therapy: Secondary | ICD-10-CM | POA: Insufficient documentation

## 2014-09-16 DIAGNOSIS — R52 Pain, unspecified: Secondary | ICD-10-CM

## 2014-09-16 DIAGNOSIS — Z86711 Personal history of pulmonary embolism: Secondary | ICD-10-CM | POA: Diagnosis not present

## 2014-09-16 DIAGNOSIS — Z720 Tobacco use: Secondary | ICD-10-CM | POA: Insufficient documentation

## 2014-09-16 DIAGNOSIS — Z853 Personal history of malignant neoplasm of breast: Secondary | ICD-10-CM | POA: Diagnosis not present

## 2014-09-16 DIAGNOSIS — R062 Wheezing: Secondary | ICD-10-CM | POA: Diagnosis not present

## 2014-09-16 DIAGNOSIS — M549 Dorsalgia, unspecified: Secondary | ICD-10-CM | POA: Diagnosis not present

## 2014-09-16 DIAGNOSIS — M545 Low back pain, unspecified: Secondary | ICD-10-CM

## 2014-09-16 DIAGNOSIS — I10 Essential (primary) hypertension: Secondary | ICD-10-CM | POA: Diagnosis not present

## 2014-09-16 MED ORDER — CELECOXIB 100 MG PO CAPS: 100.0000 mg | ORAL_CAPSULE | Freq: Two times a day (BID) | ORAL | Status: DC

## 2014-09-16 MED ORDER — ONDANSETRON 4 MG PO TBDP: ORAL_TABLET | ORAL | Status: DC

## 2014-09-16 MED ORDER — OXYCODONE-ACETAMINOPHEN 5-325 MG PO TABS
ORAL_TABLET | ORAL | Status: AC
  Filled 2014-09-16: qty 2

## 2014-09-16 MED ORDER — OXYCODONE-ACETAMINOPHEN 5-325 MG PO TABS: 1.0000 | ORAL_TABLET | Freq: Three times a day (TID) | ORAL | Status: DC | PRN

## 2014-09-16 MED ORDER — OXYCODONE-ACETAMINOPHEN 5-325 MG PO TABS
2.0000 | ORAL_TABLET | Freq: Once | ORAL | Status: AC
  Administered 2014-09-16: 2 via ORAL

## 2014-09-16 NOTE — ED Notes (Signed)
Pain began Feb. 3. Seen by Dr. Legrand Rams on the 8th. States lower back pain with no known injury. Pt was placed on pain medication and muscle relaxer. Pt states increased pain. Pt has not had an xray of her back.

## 2014-09-16 NOTE — ED Notes (Signed)
Patient given discharge instruction, verbalized understand. Patient ambulatory out of the department.  

## 2014-09-16 NOTE — Discharge Instructions (Signed)
Follow up with your md next week for recheck °

## 2014-09-16 NOTE — ED Provider Notes (Signed)
CSN: [id number]     Arrival date & time 09/16/14  2500 History  This chart was scribed for Lauren Diego, MD by Chester Holstein, ED Scribe. This patient was seen in room [facility] and the patient's care was started at 9:10 AM.      Chief Complaint  Patient presents with  . Back Pain    Patient is a 74 y.o. female presenting with back pain. The history is provided by the patient. No language interpreter was used.  Back Pain Location:  Lumbar spine Quality:  Aching Pain severity:  Moderate Associated symptoms: no abdominal pain, no chest pain and no headaches    HPI Comments: [name] is a 74 y.o. female with PMHx of HTN, tendonitis, embolism, and breast CA who presents to the Emergency Department complaining of low back pain with onset 09/02/14.  Pt saw her PCP, Dr. Legrand Rams on 09/07/14 and was prescribed a mucles relaxer and Tramadol. Pt notes she does not like taking the rel Pt notes pain radiates to arms and legs. Pt states movement and coughing aggravates the pain. Pt has not had a back XR before.  Past Medical History  Diagnosis Date  . Hypertension   . Tendonitis 07/2010    shot of cortisone  . Embolism - blood clot     left lung  . Breast cancer     rt dx 2010; mastectomy only  . Breast CA 01/16/2011  . Infiltrating ductal carcinoma of breast 01/16/2011    Started Arimidex on 02/24/2009. Stage IB (T1b N0 M0) grade 1 infiltrating ductal carcinoma of the right breast, status post right modified radical mastectomy on 01/22/2009 for a 10 mm cancer, ER +100%, PR +98%, KI-67 marker was 9%, HER-2 negative, no LVI identified, and 11 lymph nodes were negative, all margins were clear. She had some associated DCIS, is now on Arimidex and she will take that for   Past Surgical History  Procedure Laterality Date  . Appendectomy  1974    blood clot in lung  . Abdominal hysterectomy  1987  . Breast biopsy  1968    left  . Tubal ligation  1972  . Nasal sinus surgery  2002  . Mastectomy  modified radical  01/22/2009    rt  . Tear duct probing    . Yag laser application Right 3/70/4888    Procedure: YAG LASER APPLICATION;  Surgeon: Williams Che, MD;  Location: [facility];  Service: Ophthalmology;  Laterality: Right;   Family History  Problem Relation Age of Onset  . Breast cancer Sister   . Breast cancer Maternal Grandmother   . Colon cancer Daughter    History  Substance Use Topics  . Smoking status: Current Every Day Smoker -- 0.50 packs/day    Types: Cigarettes  . Smokeless tobacco: Never Used  . Alcohol Use: No   OB History    No data available     Review of Systems  Constitutional: Negative for appetite change and fatigue.  HENT: Negative for congestion, ear discharge and sinus pressure.   Eyes: Negative for discharge.  Respiratory: Negative for cough.   Cardiovascular: Negative for chest pain.  Gastrointestinal: Negative for abdominal pain and diarrhea.  Genitourinary: Negative for frequency and hematuria.  Musculoskeletal: Positive for back pain.  Skin: Negative for rash.  Neurological: Negative for seizures and headaches.  Psychiatric/Behavioral: Negative for hallucinations.      Allergies  Pollen extract  Home Medications   Prior to Admission medications  Medication Sig Start Date End Date Taking? Authorizing Provider  amLODipine (NORVASC) 5 MG tablet Take 5 mg by mouth daily.    Historical Provider, MD  anastrozole (ARIMIDEX) 1 MG tablet Take 1 tablet (1 mg total) by mouth daily. 07/29/13   Baird Cancer, PA-C  aspirin-sod bicarb-citric acid (ALKA-SELTZER) 325 MG TBEF tablet Take 650 mg by mouth daily as needed (cold).    Historical Provider, MD  Calcium Carb-Cholecalciferol (CALCIUM + D3 PO) Take 1 capsule by mouth 2 (two) times daily.     Historical Provider, MD  Cholecalciferol (VITAMIN D) 2000 UNITS CAPS Take 1 capsule by mouth 2 (two) times daily.    Historical Provider, MD  ibuprofen (ADVIL,MOTRIN) 200 MG tablet Take 600 mg by  mouth every 6 (six) hours as needed for moderate pain.    Historical Provider, MD  lovastatin (MEVACOR) 40 MG tablet Take 40 mg by mouth at bedtime.     Historical Provider, MD  metoprolol tartrate (LOPRESSOR) 25 MG tablet Take 25 mg by mouth daily.     Historical Provider, MD  naphazoline-glycerin (CLEAR EYES) 0.012-0.2 % SOLN Place 2 drops into both eyes daily as needed for irritation.    Historical Provider, MD  nitroGLYCERIN (NITROSTAT) 0.4 MG SL tablet Place 0.4 mg under the tongue every 5 (five) minutes as needed for chest pain.     Historical Provider, MD  omeprazole (PRILOSEC) 20 MG capsule Take 1 capsule by mouth daily. 06/28/13   Historical Provider, MD   BP 160/85 mmHg  Pulse 74  Temp(Src) 98.3 F (36.8 C) (Oral)  Resp 16  Ht 5' 6" (1.676 m)  Wt 136 lb (61.689 kg)  BMI 21.96 kg/m2  SpO2 97% Physical Exam  Constitutional: She is oriented to person, place, and time. She appears well-developed and well-nourished.  HENT:  Head: Normocephalic.  Eyes: Conjunctivae and EOM are normal. No scleral icterus.  Neck: Neck supple. No thyromegaly present.  Cardiovascular: Normal rate, regular rhythm and normal heart sounds.  Exam reveals no gallop and no friction rub.   No murmur heard. Pulmonary/Chest: Effort normal. No stridor. No respiratory distress. She has wheezes (minor bilaterally). She has no rales. She exhibits no tenderness.  Abdominal: She exhibits no distension. There is no tenderness. There is no rebound.  Musculoskeletal: Normal range of motion. She exhibits no edema.       Lumbar back: She exhibits tenderness (moderate) and bony tenderness.  Lymphadenopathy:    She has no cervical adenopathy.  Neurological: She is alert and oriented to person, place, and time. She exhibits normal muscle tone. Coordination normal.  Skin: No rash noted. No erythema.  Psychiatric: She has a normal mood and affect. Her behavior is normal.  Nursing note and vitals reviewed.   ED Course   Procedures (including critical care time) DIAGNOSTIC STUDIES: Oxygen Saturation is 97% on room air, normal by my interpretation.    COORDINATION OF CARE: 9:15 AM Discussed treatment plan with patient at beside, the patient agrees with the plan and has no further questions at this time.   Labs Review Labs Reviewed - No data to display  Imaging Review No results found.   EKG Interpretation None      MDM   Final diagnoses:  None    Back pain,   tx with celebrex, percocet zofran and follow up   The chart was scribed for me under my direct supervision.  I personally performed the history, physical, and medical decision making and all procedures in  the evaluation of this patient..     L , MD 09/16/14 1051 

## 2014-09-16 NOTE — ED Notes (Signed)
Pt denies CP, SOB, nausea or diaphoresis.

## 2014-09-22 ENCOUNTER — Other Ambulatory Visit (HOSPITAL_COMMUNITY): Payer: Self-pay | Admitting: Internal Medicine

## 2014-09-22 DIAGNOSIS — M545 Low back pain: Secondary | ICD-10-CM

## 2014-09-27 DIAGNOSIS — J209 Acute bronchitis, unspecified: Secondary | ICD-10-CM | POA: Diagnosis not present

## 2014-09-29 ENCOUNTER — Ambulatory Visit (HOSPITAL_COMMUNITY)
Admission: RE | Admit: 2014-09-29 | Discharge: 2014-09-29 | Disposition: A | Discharge status: Home / Self Care | Payer: Commercial Managed Care - HMO | Source: Ambulatory Visit | Attending: Internal Medicine | Admitting: Internal Medicine

## 2014-09-29 DIAGNOSIS — M5136 Other intervertebral disc degeneration, lumbar region: Secondary | ICD-10-CM | POA: Insufficient documentation

## 2014-09-29 DIAGNOSIS — Z853 Personal history of malignant neoplasm of breast: Secondary | ICD-10-CM | POA: Diagnosis not present

## 2014-09-29 DIAGNOSIS — M47816 Spondylosis without myelopathy or radiculopathy, lumbar region: Secondary | ICD-10-CM | POA: Insufficient documentation

## 2014-09-29 DIAGNOSIS — M545 Low back pain: Secondary | ICD-10-CM | POA: Diagnosis not present

## 2014-09-29 DIAGNOSIS — M5126 Other intervertebral disc displacement, lumbar region: Secondary | ICD-10-CM | POA: Diagnosis not present

## 2014-09-29 DIAGNOSIS — M5137 Other intervertebral disc degeneration, lumbosacral region: Secondary | ICD-10-CM | POA: Insufficient documentation

## 2014-09-29 DIAGNOSIS — M4806 Spinal stenosis, lumbar region: Secondary | ICD-10-CM | POA: Diagnosis not present

## 2014-10-19 DIAGNOSIS — M5416 Radiculopathy, lumbar region: Secondary | ICD-10-CM | POA: Diagnosis not present

## 2014-10-19 DIAGNOSIS — I10 Essential (primary) hypertension: Secondary | ICD-10-CM | POA: Diagnosis not present

## 2014-10-19 DIAGNOSIS — M5127 Other intervertebral disc displacement, lumbosacral region: Secondary | ICD-10-CM | POA: Diagnosis not present

## 2014-10-19 DIAGNOSIS — M25552 Pain in left hip: Secondary | ICD-10-CM | POA: Diagnosis not present

## 2014-12-14 ENCOUNTER — Other Ambulatory Visit (HOSPITAL_COMMUNITY): Payer: Self-pay | Admitting: Internal Medicine

## 2014-12-14 DIAGNOSIS — Z1231 Encounter for screening mammogram for malignant neoplasm of breast: Secondary | ICD-10-CM

## 2015-01-18 ENCOUNTER — Ambulatory Visit (HOSPITAL_COMMUNITY)
Admission: RE | Admit: 2015-01-18 | Discharge: 2015-01-18 | Disposition: A | Discharge status: Home / Self Care | Payer: Commercial Managed Care - HMO | Source: Ambulatory Visit | Attending: Internal Medicine | Admitting: Internal Medicine

## 2015-01-18 DIAGNOSIS — Z1231 Encounter for screening mammogram for malignant neoplasm of breast: Secondary | ICD-10-CM | POA: Diagnosis not present

## 2015-01-29 DIAGNOSIS — L089 Local infection of the skin and subcutaneous tissue, unspecified: Secondary | ICD-10-CM | POA: Diagnosis not present

## 2015-01-29 DIAGNOSIS — L259 Unspecified contact dermatitis, unspecified cause: Secondary | ICD-10-CM | POA: Diagnosis not present

## 2015-02-11 ENCOUNTER — Encounter (HOSPITAL_COMMUNITY): Payer: Commercial Managed Care - HMO | Attending: Hematology & Oncology

## 2015-02-11 ENCOUNTER — Encounter (HOSPITAL_BASED_OUTPATIENT_CLINIC_OR_DEPARTMENT_OTHER): Payer: Commercial Managed Care - HMO | Admitting: Hematology & Oncology

## 2015-02-11 ENCOUNTER — Ambulatory Visit (HOSPITAL_COMMUNITY): Payer: Medicare HMO | Admitting: Hematology & Oncology

## 2015-02-11 VITALS — BP 121/76 | HR 75 | Temp 98.2°F | Resp 20 | Wt 135.3 lb

## 2015-02-11 DIAGNOSIS — Z853 Personal history of malignant neoplasm of breast: Secondary | ICD-10-CM | POA: Diagnosis not present

## 2015-02-11 DIAGNOSIS — C50911 Malignant neoplasm of unspecified site of right female breast: Secondary | ICD-10-CM | POA: Diagnosis not present

## 2015-02-11 DIAGNOSIS — M8580 Other specified disorders of bone density and structure, unspecified site: Secondary | ICD-10-CM

## 2015-02-11 DIAGNOSIS — Z720 Tobacco use: Secondary | ICD-10-CM

## 2015-02-11 DIAGNOSIS — R739 Hyperglycemia, unspecified: Secondary | ICD-10-CM

## 2015-02-11 DIAGNOSIS — C50919 Malignant neoplasm of unspecified site of unspecified female breast: Secondary | ICD-10-CM

## 2015-02-11 LAB — COMPREHENSIVE METABOLIC PANEL
ALK PHOS: 61 U/L (ref 38–126)
ALT: 12 U/L — ABNORMAL LOW (ref 14–54)
ANION GAP: 9 (ref 5–15)
AST: 17 U/L (ref 15–41)
Albumin: 3.7 g/dL (ref 3.5–5.0)
BILIRUBIN TOTAL: 0.9 mg/dL (ref 0.3–1.2)
BUN: 12 mg/dL (ref 6–20)
CALCIUM: 8.9 mg/dL (ref 8.9–10.3)
CO2: 26 mmol/L (ref 22–32)
CREATININE: 0.89 mg/dL (ref 0.44–1.00)
Chloride: 100 mmol/L — ABNORMAL LOW (ref 101–111)
GFR calc Af Amer: >60 mL/min (ref >60)
GFR calc non Af Amer: >60 mL/min (ref >60)
GLUCOSE: 230 mg/dL — AB (ref 65–99)
POTASSIUM: 4 mmol/L (ref 3.5–5.1)
Sodium: 135 mmol/L (ref 135–145)
Total Protein: 7.4 g/dL (ref 6.5–8.1)

## 2015-02-11 LAB — CBC WITH DIFFERENTIAL/PLATELET
Basophils Absolute: 0 10*3/uL (ref 0.0–0.1)
Basophils Relative: 0 % (ref 0–1)
EOS ABS: 0.1 10*3/uL (ref 0.0–0.7)
EOS PCT: 1 % (ref 0–5)
HEMATOCRIT: 40.1 % (ref 36.0–46.0)
Hemoglobin: 13.6 g/dL (ref 12.0–15.0)
Lymphocytes Relative: 22 % (ref 12–46)
Lymphs Abs: 2.3 10*3/uL (ref 0.7–4.0)
MCH: 30.8 pg (ref 26.0–34.0)
MCHC: 33.9 g/dL (ref 30.0–36.0)
MCV: 90.7 fL (ref 78.0–100.0)
Monocytes Absolute: 0.9 10*3/uL (ref 0.1–1.0)
Monocytes Relative: 9 % (ref 3–12)
Neutro Abs: 7 10*3/uL (ref 1.7–7.7)
Neutrophils Relative %: 68 % (ref 43–77)
Platelets: 183 10*3/uL (ref 150–400)
RBC: 4.42 MIL/uL (ref 3.87–5.11)
RDW: 13.3 % (ref 11.5–15.5)
WBC: 10.4 10*3/uL (ref 4.0–10.5)

## 2015-02-11 NOTE — Progress Notes (Signed)
[place] at [facility] Progress Note  Patient Care Team: Rosita Fire, MD as PCP - General (Internal Medicine)  CHIEF COMPLAINTS/PURPOSE OF CONSULTATION:  Stage I R breast cancer (T1B, N0,M0) R Modified radical mastectomy Arimidex therapy starting on 02/16/2009 History of PE after appendectomy in 1974 Osteopenia by DEXA 02/12/2014  HISTORY OF PRESENTING ILLNESS:  [name] 74 y.o. female is here because of a history of stage I carcinoma of the right breast. The patient is present alone today and says that she is feeling "so-so".  She has recently been stung by a hornet while on a riding mower.  She put ice on it afterward and went to the doctor for an antibiotic the next day.  She was concerned about her arm because she notes "she has had all of her lymph nodes removed" at the time of her breast cancer surgery. She did not develop any evidence of infection.  She has had a hysterectomy. The patient has osteopenia. She finished her Arimidex treatment last year. She says that she does self breast examinations  Her primary Doctor is Dr. Legrand Rams  She is up-to-date on Mammogram and Colonoscopy The patient takes Calcium and Vitamin D twice daily.  She also is regular with her cholesterol and blood pressure medication.   MEDICAL HISTORY:  Past Medical History  Diagnosis Date  . Hypertension   . Tendonitis 07/2010    shot of cortisone  . Embolism - blood clot     left lung  . Breast cancer     rt dx 2010; mastectomy only  . Breast CA 01/16/2011  . Infiltrating ductal carcinoma of breast 01/16/2011    Started Arimidex on 02/24/2009. Stage IB (T1b N0 M0) grade 1 infiltrating ductal carcinoma of the right breast, status post right modified radical mastectomy on 01/22/2009 for a 10 mm cancer, ER +100%, PR +98%, KI-67 marker was 9%, HER-2 negative, no LVI identified, and 11 lymph nodes were negative, all margins were clear. She had some associated DCIS, is now on Arimidex  and she will take that for    SURGICAL HISTORY: Past Surgical History  Procedure Laterality Date  . Appendectomy  1974    blood clot in lung  . Abdominal hysterectomy  1987  . Breast biopsy  1968    left  . Tubal ligation  1972  . Nasal sinus surgery  2002  . Mastectomy modified radical  01/22/2009    rt  . Tear duct probing    . Yag laser application Right 08/05/2692    Procedure: YAG LASER APPLICATION;  Surgeon: Williams Che, MD;  Location: [facility];  Service: Ophthalmology;  Laterality: Right;    SOCIAL HISTORY: History   Social History  . Marital Status: Divorced    Spouse Name: N/A  . Number of Children: N/A  . Years of Education: N/A   Occupational History  . Not on file.   Social History Main Topics  . Smoking status: Current Every Day Smoker -- 0.50 packs/day    Types: Cigarettes  . Smokeless tobacco: Never Used  . Alcohol Use: No  . Drug Use: Not on file  . Sexual Activity: Not on file   Other Topics Concern  . Not on file   Social History Narrative  1 son who is disabled, 2 daughters 3 grandchildren 5 great grandchildren  FAMILY HISTORY: Family History  Problem Relation Age of Onset  . Breast cancer Sister   . Breast cancer Maternal Grandmother   .  Colon cancer Daughter    indicated that her sister is alive. She indicated that her maternal grandmother is deceased. She indicated that her daughter is alive.   ALLERGIES:  is allergic to pollen extract.  MEDICATIONS:  Current Outpatient Prescriptions  Medication Sig Dispense Refill  . amLODipine (NORVASC) 5 MG tablet Take 5 mg by mouth daily.    Marland Kitchen aspirin-sod bicarb-citric acid (ALKA-SELTZER) 325 MG TBEF tablet Take 650 mg by mouth daily as needed (cold).    . Calcium Carb-Cholecalciferol (CALCIUM + D3 PO) Take 1 capsule by mouth 2 (two) times daily.     . celecoxib (CELEBREX) 100 MG capsule Take 1 capsule (100 mg total) by mouth 2 (two) times daily. 20 capsule 0  . lovastatin (MEVACOR) 40 MG  tablet Take 40 mg by mouth at bedtime.     . metoprolol tartrate (LOPRESSOR) 25 MG tablet Take 25 mg by mouth daily.     Marland Kitchen omeprazole (PRILOSEC) 20 MG capsule Take 1 capsule by mouth daily.    Marland Kitchen anastrozole (ARIMIDEX) 1 MG tablet Take 1 tablet (1 mg total) by mouth daily. (Patient not taking: Reported on 09/16/2014) 30 tablet 6  . ibuprofen (ADVIL,MOTRIN) 200 MG tablet Take 600 mg by mouth every 6 (six) hours as needed for moderate pain.    . naphazoline-glycerin (CLEAR EYES) 0.012-0.2 % SOLN Place 2 drops into both eyes daily as needed for irritation.     No current facility-administered medications for this visit.    Review of Systems  Constitutional: Negative for fever, chills, weight loss and malaise/fatigue.  HENT: Negative for congestion, hearing loss, nosebleeds, sore throat and tinnitus.   Eyes: Negative for blurred vision, double vision, pain and discharge.  Respiratory: Negative for cough, hemoptysis, sputum production, shortness of breath and wheezing.   Cardiovascular: Negative for chest pain, palpitations, claudication, leg swelling and PND.  Gastrointestinal: Negative for heartburn, nausea, vomiting, abdominal pain, diarrhea, constipation, blood in stool and melena.  Genitourinary: Negative for dysuria, urgency, frequency and hematuria.  Musculoskeletal: Positive for back pain and joint pain. Negative for myalgias and falls.  Skin: Negative for itching and rash.  Neurological: Negative for dizziness, tingling, tremors, sensory change, speech change, focal weakness, seizures, loss of consciousness, weakness and headaches.  Endo/Heme/Allergies: Does not bruise/bleed easily.  Psychiatric/Behavioral: Negative for depression, suicidal ideas, memory loss and substance abuse. The patient is not nervous/anxious and does not have insomnia.   All other systems reviewed and are negative.  14 point ROS was done and is otherwise as detailed above or in HPI   PHYSICAL EXAMINATION: ECOG  PERFORMANCE STATUS: 0 - Asymptomatic  Filed Vitals:   02/11/15 1356  BP: 121/76  Pulse: 75  Temp: 98.2 F (36.8 C)  Resp: 20   Filed Weights   02/11/15 1356  Weight: 135 lb 4.8 oz (61.372 kg)     Physical Exam  Constitutional: She is oriented to person, place, and time and well-developed, well-nourished, and in no distress.  HENT:  Head: Normocephalic and atraumatic.  Nose: Nose normal.  Mouth/Throat: Oropharynx is clear and moist. No oropharyngeal exudate.  Eyes: Conjunctivae and EOM are normal. Pupils are equal, round, and reactive to light. Right eye exhibits no discharge. Left eye exhibits no discharge. No scleral icterus.  Neck: Normal range of motion. Neck supple. No tracheal deviation present. No thyromegaly present.  Cardiovascular: Normal rate, regular rhythm and normal heart sounds.  Exam reveals no gallop and no friction rub.   No murmur heard. Light swelling in  right arm, she has never worn a sleeve and glove. She notes the changes in her arm are chronic.  Pulmonary/Chest: Effort normal and breath sounds normal. She has no wheezes. She has no rales.    Abdominal: Soft. Bowel sounds are normal. She exhibits no distension and no mass. There is no tenderness. There is no rebound and no guarding.  Musculoskeletal: Normal range of motion. She exhibits no edema.  Lymphadenopathy:    She has no cervical adenopathy.  Neurological: She is alert and oriented to person, place, and time. She has normal reflexes. No cranial nerve deficit. Gait normal. Coordination normal.  Skin: Skin is warm and dry. No rash noted.  Psychiatric: Mood, memory, affect and judgment normal.  Nursing note and vitals reviewed.   LABORATORY DATA:  I have reviewed the data as listed Lab Results  Component Value Date   WBC 10.4 02/11/2015   HGB 13.6 02/11/2015   HCT 40.1 02/11/2015   MCV 90.7 02/11/2015   PLT 183 02/11/2015   CMP     Component Value Date/Time   NA 135 02/11/2015 1350   K  4.0 02/11/2015 1350   CL 100* 02/11/2015 1350   CO2 26 02/11/2015 1350   GLUCOSE 230* 02/11/2015 1350   BUN 12 02/11/2015 1350   CREATININE 0.89 02/11/2015 1350   CALCIUM 8.9 02/11/2015 1350   PROT 7.4 02/11/2015 1350   ALBUMIN 3.7 02/11/2015 1350   AST 17 02/11/2015 1350   ALT 12* 02/11/2015 1350   ALKPHOS 61 02/11/2015 1350   BILITOT 0.9 02/11/2015 1350   GFRNONAA >60 02/11/2015 1350   GFRAA >60 02/11/2015 1350      RADIOGRAPHIC STUDIES: I have personally reviewed the radiological images as listed and agreed with the findings in the report. CLINICAL DATA: Screening.  EXAM: DIGITAL SCREENING UNILATERAL LEFT MAMMOGRAM WITH TOMO AND CAD  COMPARISON: Previous exam(s).  ACR Breast Density Category b: There are scattered areas of fibroglandular density.  FINDINGS: The patient has had a right mastectomy. There are no findings suspicious for malignancy.  Images were processed with CAD.  IMPRESSION: No mammographic evidence of malignancy. A result letter of this screening mammogram will be mailed directly to the patient.  RECOMMENDATION: Screening mammogram in one year. ([id number])  BI-RADS CATEGORY 1: Negative.   Electronically Signed  By: Claudie Revering M.D.  On: 01/18/2015 17:00   ASSESSMENT & PLAN:  Stage I R breast cancer (T1B, N0,M0) R Modified radical mastectomy Arimidex therapy starting on 02/16/2009 History of PE after appendectomy in 1974 Osteopenia by DEXA 02/12/2014 Tobacco use   She is without obvious recurrence. Her mammograms are ordered prior primary care. She is up-to-date on mammography. Breast exam is benign. She completed 5 years of Arimidex and opted to discontinue this.  She has osteopenia and I have encouraged weightbearing exercise and ongoing calcium and vitamin D.  I have encouraged smoking cessation and we discussed ways she could quit. She is currently not interested in stopping smoking.  Her blood sugar is elevated  today on her CMP. She notes she just 8 prior to her visit. She does have follow-up with her primary in the next month or 2 as well.  She would like to continue with yearly visits. We will arrange for follow-up in one year with repeat physical exam.  All questions were answered. The patient knows to call the clinic with any problems, questions or concerns.   This document serves as a record of services personally performed by Ancil Linsey, MD.  It was created on her behalf by Janace Hoard, a trained medical scribe. The creation of this record is based on the scribe's personal observations and the provider's statements to them. This document has been checked and approved by the attending provider.  I have reviewed the above documentation for accuracy and completeness, and I agree with the above.  This note is electronically signed.  Kelby Fam. Whitney Muse, MD

## 2015-02-11 NOTE — Patient Instructions (Addendum)
[place] at [facility] Discharge Instructions  RECOMMENDATIONS MADE BY THE CONSULTANT AND ANY TEST RESULTS WILL BE SENT TO YOUR REFERRING PHYSICIAN.  Return in 1 year to see Dr. Whitney Muse  Thank you for choosing [place] at [facility] to provide your oncology and hematology care.  To afford each patient quality time with our provider, please arrive at least 15 minutes before your scheduled appointment time.    You need to re-schedule your appointment should you arrive 10 or more minutes late.  We strive to give you quality time with our providers, and arriving late affects you and other patients whose appointments are after yours.  Also, if you no show three or more times for appointments you may be dismissed from the clinic at the providers discretion.     Again, thank you for choosing [facility].  Our hope is that these requests will decrease the amount of time that you wait before being seen by our physicians.       _____________________________________________________________  Should you have questions after your visit to [facility], please contact our office at [phone] between the hours of 8:30 a.m. and 4:30 p.m.  Voicemails left after 4:30 p.m. will not be returned until the following business day.  For prescription refill requests, have your pharmacy contact our office.

## 2015-02-12 LAB — CEA: CEA: 2.5 ng/mL (ref 0.0–4.7)

## 2015-02-12 LAB — CANCER ANTIGEN 27.29: CA 27.29: 21.8 U/mL (ref 0.0–38.6)

## 2015-02-18 DIAGNOSIS — H2512 Age-related nuclear cataract, left eye: Secondary | ICD-10-CM | POA: Diagnosis not present

## 2015-03-01 DIAGNOSIS — I10 Essential (primary) hypertension: Secondary | ICD-10-CM | POA: Diagnosis not present

## 2015-03-01 DIAGNOSIS — E785 Hyperlipidemia, unspecified: Secondary | ICD-10-CM | POA: Diagnosis not present

## 2015-03-01 DIAGNOSIS — K219 Gastro-esophageal reflux disease without esophagitis: Secondary | ICD-10-CM | POA: Diagnosis not present

## 2015-03-01 DIAGNOSIS — C50919 Malignant neoplasm of unspecified site of unspecified female breast: Secondary | ICD-10-CM | POA: Diagnosis not present

## 2015-03-01 DIAGNOSIS — Z0000 Encounter for general adult medical examination without abnormal findings: Secondary | ICD-10-CM | POA: Diagnosis not present

## 2015-03-01 DIAGNOSIS — J40 Bronchitis, not specified as acute or chronic: Secondary | ICD-10-CM | POA: Diagnosis not present

## 2015-03-01 DIAGNOSIS — M545 Low back pain: Secondary | ICD-10-CM | POA: Diagnosis not present

## 2015-03-01 DIAGNOSIS — J309 Allergic rhinitis, unspecified: Secondary | ICD-10-CM | POA: Diagnosis not present

## 2015-03-01 DIAGNOSIS — E119 Type 2 diabetes mellitus without complications: Secondary | ICD-10-CM | POA: Diagnosis not present

## 2015-03-01 DIAGNOSIS — F17200 Nicotine dependence, unspecified, uncomplicated: Secondary | ICD-10-CM | POA: Diagnosis not present

## 2015-03-09 ENCOUNTER — Encounter (HOSPITAL_COMMUNITY): Payer: Self-pay | Admitting: Hematology & Oncology

## 2015-03-17 DIAGNOSIS — E1165 Type 2 diabetes mellitus with hyperglycemia: Secondary | ICD-10-CM | POA: Diagnosis not present

## 2015-03-17 DIAGNOSIS — E119 Type 2 diabetes mellitus without complications: Secondary | ICD-10-CM | POA: Diagnosis not present

## 2015-03-17 DIAGNOSIS — I10 Essential (primary) hypertension: Secondary | ICD-10-CM | POA: Diagnosis not present

## 2015-03-23 DIAGNOSIS — H2512 Age-related nuclear cataract, left eye: Secondary | ICD-10-CM | POA: Diagnosis not present

## 2015-06-22 DIAGNOSIS — E1165 Type 2 diabetes mellitus with hyperglycemia: Secondary | ICD-10-CM | POA: Diagnosis not present

## 2015-06-22 DIAGNOSIS — E785 Hyperlipidemia, unspecified: Secondary | ICD-10-CM | POA: Diagnosis not present

## 2015-06-22 DIAGNOSIS — F17200 Nicotine dependence, unspecified, uncomplicated: Secondary | ICD-10-CM | POA: Diagnosis not present

## 2015-06-22 DIAGNOSIS — J40 Bronchitis, not specified as acute or chronic: Secondary | ICD-10-CM | POA: Diagnosis not present

## 2015-06-22 DIAGNOSIS — I10 Essential (primary) hypertension: Secondary | ICD-10-CM | POA: Diagnosis not present

## 2015-09-21 DIAGNOSIS — H538 Other visual disturbances: Secondary | ICD-10-CM | POA: Diagnosis not present

## 2015-09-21 DIAGNOSIS — H2512 Age-related nuclear cataract, left eye: Secondary | ICD-10-CM | POA: Diagnosis not present

## 2015-09-21 DIAGNOSIS — E119 Type 2 diabetes mellitus without complications: Secondary | ICD-10-CM | POA: Diagnosis not present

## 2015-09-23 ENCOUNTER — Other Ambulatory Visit (HOSPITAL_COMMUNITY): Payer: Self-pay | Admitting: Internal Medicine

## 2015-09-23 DIAGNOSIS — E119 Type 2 diabetes mellitus without complications: Secondary | ICD-10-CM | POA: Diagnosis not present

## 2015-09-23 DIAGNOSIS — I714 Abdominal aortic aneurysm, without rupture, unspecified: Secondary | ICD-10-CM

## 2015-09-23 DIAGNOSIS — J410 Simple chronic bronchitis: Secondary | ICD-10-CM | POA: Diagnosis not present

## 2015-09-23 DIAGNOSIS — I10 Essential (primary) hypertension: Secondary | ICD-10-CM | POA: Diagnosis not present

## 2015-09-23 DIAGNOSIS — F411 Generalized anxiety disorder: Secondary | ICD-10-CM | POA: Diagnosis not present

## 2015-09-30 ENCOUNTER — Other Ambulatory Visit (HOSPITAL_COMMUNITY): Payer: Self-pay | Admitting: Respiratory Therapy

## 2015-09-30 DIAGNOSIS — J410 Simple chronic bronchitis: Secondary | ICD-10-CM

## 2015-10-06 ENCOUNTER — Ambulatory Visit (HOSPITAL_COMMUNITY)
Admission: RE | Admit: 2015-10-06 | Discharge: 2015-10-06 | Disposition: A | Discharge status: Home / Self Care | Payer: Commercial Managed Care - HMO | Source: Ambulatory Visit | Attending: Internal Medicine | Admitting: Internal Medicine

## 2015-10-06 DIAGNOSIS — Z136 Encounter for screening for cardiovascular disorders: Secondary | ICD-10-CM | POA: Insufficient documentation

## 2015-10-06 DIAGNOSIS — I708 Atherosclerosis of other arteries: Secondary | ICD-10-CM | POA: Insufficient documentation

## 2015-10-06 DIAGNOSIS — I714 Abdominal aortic aneurysm, without rupture, unspecified: Secondary | ICD-10-CM

## 2015-10-13 ENCOUNTER — Ambulatory Visit (HOSPITAL_COMMUNITY): Admission: RE | Admit: 2015-10-13 | Payer: Commercial Managed Care - HMO | Source: Ambulatory Visit

## 2015-10-21 ENCOUNTER — Telehealth: Payer: Self-pay | Admitting: Acute Care

## 2015-10-21 NOTE — Telephone Encounter (Signed)
Charma Igo CMA had spoken with patient to discuss and schedule [facility] Pt declined program d/t inability to travel to [facility] Letter sent to Dr Rosita Fire

## 2015-10-25 ENCOUNTER — Encounter: Payer: Commercial Managed Care - HMO | Admitting: Acute Care

## 2015-11-16 DIAGNOSIS — N390 Urinary tract infection, site not specified: Secondary | ICD-10-CM | POA: Diagnosis not present

## 2015-11-16 DIAGNOSIS — I10 Essential (primary) hypertension: Secondary | ICD-10-CM | POA: Diagnosis not present

## 2015-11-16 DIAGNOSIS — E119 Type 2 diabetes mellitus without complications: Secondary | ICD-10-CM | POA: Diagnosis not present

## 2015-12-10 ENCOUNTER — Other Ambulatory Visit (HOSPITAL_COMMUNITY): Payer: Self-pay | Admitting: Internal Medicine

## 2015-12-10 DIAGNOSIS — Z1231 Encounter for screening mammogram for malignant neoplasm of breast: Secondary | ICD-10-CM

## 2015-12-15 ENCOUNTER — Ambulatory Visit (HOSPITAL_COMMUNITY)
Admission: RE | Admit: 2015-12-15 | Discharge: 2015-12-15 | Disposition: A | Discharge status: Home / Self Care | Payer: Commercial Managed Care - HMO | Source: Ambulatory Visit | Attending: Internal Medicine | Admitting: Internal Medicine

## 2015-12-15 DIAGNOSIS — J410 Simple chronic bronchitis: Secondary | ICD-10-CM | POA: Insufficient documentation

## 2015-12-15 LAB — PULMONARY FUNCTION TEST
DL/VA % PRED: 64 %
DL/VA: 3.17 ml/min/mmHg/L
DLCO UNC % PRED: 43 %
DLCO unc: 11.16 ml/min/mmHg
FEF 25-75 Pre: 2.31 L/sec
FEF2575-%PRED-PRE: 140 %
FEV1-%Pred-Pre: 74 %
FEV1-Pre: 1.61 L
FEV1FVC-%PRED-PRE: 114 %
FEV6-%Pred-Pre: 68 %
FEV6-PRE: 1.88 L
FEV6FVC-%Pred-Pre: 105 %
FVC-%PRED-PRE: 65 %
FVC-PRE: 1.88 L
PRE FEV6/FVC RATIO: 100 %
Pre FEV1/FVC ratio: 86 %
RV % pred: 90 %
RV: 2.14 L
TLC % PRED: 79 %
TLC: 4.13 L

## 2016-01-10 ENCOUNTER — Other Ambulatory Visit (HOSPITAL_COMMUNITY): Payer: Self-pay | Admitting: Internal Medicine

## 2016-01-10 DIAGNOSIS — G44309 Post-traumatic headache, unspecified, not intractable: Secondary | ICD-10-CM | POA: Diagnosis not present

## 2016-01-10 DIAGNOSIS — H532 Diplopia: Secondary | ICD-10-CM | POA: Diagnosis not present

## 2016-01-10 DIAGNOSIS — E119 Type 2 diabetes mellitus without complications: Secondary | ICD-10-CM | POA: Diagnosis not present

## 2016-01-11 ENCOUNTER — Ambulatory Visit (HOSPITAL_COMMUNITY)
Admission: RE | Admit: 2016-01-11 | Discharge: 2016-01-11 | Disposition: A | Discharge status: Home / Self Care | Payer: Commercial Managed Care - HMO | Source: Ambulatory Visit | Attending: Internal Medicine | Admitting: Internal Medicine

## 2016-01-11 DIAGNOSIS — H538 Other visual disturbances: Secondary | ICD-10-CM | POA: Diagnosis not present

## 2016-01-11 DIAGNOSIS — R9089 Other abnormal findings on diagnostic imaging of central nervous system: Secondary | ICD-10-CM | POA: Diagnosis not present

## 2016-01-11 DIAGNOSIS — G44309 Post-traumatic headache, unspecified, not intractable: Secondary | ICD-10-CM | POA: Diagnosis not present

## 2016-01-11 DIAGNOSIS — H532 Diplopia: Secondary | ICD-10-CM | POA: Insufficient documentation

## 2016-01-17 DIAGNOSIS — E1165 Type 2 diabetes mellitus with hyperglycemia: Secondary | ICD-10-CM | POA: Diagnosis not present

## 2016-01-17 DIAGNOSIS — I10 Essential (primary) hypertension: Secondary | ICD-10-CM | POA: Diagnosis not present

## 2016-01-17 DIAGNOSIS — H532 Diplopia: Secondary | ICD-10-CM | POA: Diagnosis not present

## 2016-01-20 ENCOUNTER — Ambulatory Visit (HOSPITAL_COMMUNITY)
Admission: RE | Admit: 2016-01-20 | Discharge: 2016-01-20 | Disposition: A | Discharge status: Home / Self Care | Payer: Commercial Managed Care - HMO | Source: Ambulatory Visit | Attending: Internal Medicine | Admitting: Internal Medicine

## 2016-01-20 DIAGNOSIS — Z1231 Encounter for screening mammogram for malignant neoplasm of breast: Secondary | ICD-10-CM | POA: Diagnosis not present

## 2016-02-15 ENCOUNTER — Ambulatory Visit (HOSPITAL_COMMUNITY): Payer: Commercial Managed Care - HMO | Admitting: Hematology & Oncology

## 2016-02-23 ENCOUNTER — Encounter (HOSPITAL_COMMUNITY): Payer: Self-pay | Admitting: Hematology & Oncology

## 2016-02-23 ENCOUNTER — Encounter (HOSPITAL_COMMUNITY): Payer: Commercial Managed Care - HMO | Attending: Hematology & Oncology | Admitting: Hematology & Oncology

## 2016-02-23 VITALS — BP 151/50 | HR 68 | Temp 98.9°F | Resp 18

## 2016-02-23 DIAGNOSIS — Z720 Tobacco use: Secondary | ICD-10-CM

## 2016-02-23 DIAGNOSIS — Z853 Personal history of malignant neoplasm of breast: Secondary | ICD-10-CM | POA: Diagnosis not present

## 2016-02-23 DIAGNOSIS — I890 Lymphedema, not elsewhere classified: Secondary | ICD-10-CM | POA: Diagnosis not present

## 2016-02-23 DIAGNOSIS — C50911 Malignant neoplasm of unspecified site of right female breast: Secondary | ICD-10-CM

## 2016-02-23 DIAGNOSIS — M8580 Other specified disorders of bone density and structure, unspecified site: Secondary | ICD-10-CM | POA: Diagnosis not present

## 2016-02-23 NOTE — Progress Notes (Signed)
[place] at [facility] Progress Note  Patient Care Team: Rosita Fire, MD as PCP - General (Internal Medicine)  CHIEF COMPLAINTS/PURPOSE OF CONSULTATION:  Stage I R breast cancer (T1B, N0,M0) R Modified radical mastectomy Arimidex therapy starting on 02/16/2009 History of PE after appendectomy in 1974 Osteopenia by DEXA 02/12/2014  HISTORY OF PRESENTING ILLNESS:  [name] 75 y.o. female is here because of a history of stage I carcinoma of the right breast.  She has had a hysterectomy. The patient has osteopenia. She finished her Arimidex therapy in 2015. She says that she does self breast examinations  Her primary Doctor is Dr. Legrand [name]   The patient takes Calcium and Vitamin D twice daily.  She also is regular with her cholesterol and blood pressure medication.  [name] is unaccompanied.   She is doing well. She continues to smoke the same amount. Her appetite is good. She has planted flowers this summer. The patient remarks that her neighbor compliments her by saying she has a greenhouse of flowers.   Reports mild swelling in her right arm, but this is normal. Stating, the other day "my skin was really tight on it". She is agreeable to meeting with a lymphedema specialist.   She reports a lesion on her left outer calf. States it is worse most mornings than it is today.   While changing into her gown, the patient states "My fingers aren't working too good". She did not require assistance getting undressed.   MEDICAL HISTORY:  Past Medical History:  Diagnosis Date  . Breast CA ([place]) 01/16/2011  . Breast cancer ([place])    rt dx 2010; mastectomy only  . Embolism - blood clot    left lung  . Hypertension   . Infiltrating ductal carcinoma of breast ([place]) 01/16/2011   Started Arimidex on 02/24/2009. Stage IB (T1b N0 M0) grade 1 infiltrating ductal carcinoma of the right breast, status post right modified radical mastectomy on 01/22/2009 for a 10 mm cancer, ER  +100%, PR +98%, KI-67 marker was 9%, HER-2 negative, no LVI identified, and 11 lymph nodes were negative, all margins were clear. She had some associated DCIS, is now on Arimidex and she will take that for  . Tendonitis 07/2010   shot of cortisone    SURGICAL HISTORY: Past Surgical History:  Procedure Laterality Date  . ABDOMINAL HYSTERECTOMY  1987  . APPENDECTOMY  1974   blood clot in lung  . BREAST BIOPSY  1968   left  . MASTECTOMY MODIFIED RADICAL  01/22/2009   rt  . NASAL SINUS SURGERY  2002  . TEAR DUCT PROBING    . tubal ligation  1972  . YAG LASER APPLICATION Right 10/27/1914   Procedure: YAG LASER APPLICATION;  Surgeon: Williams Che, MD;  Location: [facility];  Service: Ophthalmology;  Laterality: Right;    SOCIAL HISTORY: Social History   Social History  . Marital status: Divorced    Spouse name: N/A  . Number of children: N/A  . Years of education: N/A   Occupational History  . Not on file.   Social History Main Topics  . Smoking status: Current Every Day Smoker    Packs/day: 0.50    Types: Cigarettes  . Smokeless tobacco: Never Used  . Alcohol use No  . Drug use: Unknown  . Sexual activity: Not on file   Other Topics Concern  . Not on file   Social History Narrative  . No narrative on  file  1 son who is disabled, 2 daughters 3 grandchildren 18 great grandchildren  FAMILY HISTORY: Family History  Problem Relation Age of Onset  . Breast cancer Sister   . Breast cancer Maternal Grandmother   . Colon cancer Daughter    indicated that her sister is alive. She indicated that her maternal grandmother is deceased. She indicated that her daughter is alive.    ALLERGIES:  is allergic to pollen extract.  MEDICATIONS:  Current Outpatient Prescriptions  Medication Sig Dispense Refill  . amLODipine (NORVASC) 5 MG tablet Take 5 mg by mouth daily.    Marland Kitchen anastrozole (ARIMIDEX) 1 MG tablet Take 1 tablet (1 mg total) by mouth daily. 30 tablet 6  . aspirin-sod  bicarb-citric acid (ALKA-SELTZER) 325 MG TBEF tablet Take 650 mg by mouth daily as needed (cold).    . Calcium Carb-Cholecalciferol (CALCIUM + D3 PO) Take 1 capsule by mouth 2 (two) times daily.     . celecoxib (CELEBREX) 100 MG capsule Take 1 capsule (100 mg total) by mouth 2 (two) times daily. 20 capsule 0  . fluticasone (FLONASE) 50 MCG/ACT nasal spray     . gabapentin (NEURONTIN) 300 MG capsule     . ibuprofen (ADVIL,MOTRIN) 200 MG tablet Take 600 mg by mouth every 6 (six) hours as needed for moderate pain.    Marland Kitchen lovastatin (MEVACOR) 40 MG tablet Take 40 mg by mouth at bedtime.     . metFORMIN (GLUCOPHAGE) 500 MG tablet     . metoprolol tartrate (LOPRESSOR) 25 MG tablet Take 25 mg by mouth daily.     . naphazoline-glycerin (CLEAR EYES) 0.012-0.2 % SOLN Place 2 drops into both eyes daily as needed for irritation.    Marland Kitchen omeprazole (PRILOSEC) 20 MG capsule Take 1 capsule by mouth daily.    Marland Kitchen sulfamethoxazole-trimethoprim (BACTRIM DS,SEPTRA DS) 800-160 MG tablet     . TRADJENTA 5 MG TABS tablet     . TRUEPLUS LANCETS 33G MISC      No current facility-administered medications for this visit.     Review of Systems  Constitutional: Negative for chills, fever, malaise/fatigue and weight loss.  HENT: Negative for congestion, hearing loss, nosebleeds, sore throat and tinnitus.   Eyes: Negative for blurred vision, double vision, pain and discharge.  Respiratory: Negative for cough, hemoptysis, sputum production, shortness of breath and wheezing.   Cardiovascular: Negative for chest pain, palpitations, claudication, leg swelling and PND.  Gastrointestinal: Negative for abdominal pain, blood in stool, constipation, diarrhea, heartburn, melena, nausea and vomiting.  Genitourinary: Negative for dysuria, frequency, hematuria and urgency.  Musculoskeletal: Positive for back pain and joint pain. Negative for falls and myalgias.       Right arm swelling.  Skin: Negative for itching and rash.       Lesion  on left outer calf.  Neurological: Negative for dizziness, tingling, tremors, sensory change, speech change, focal weakness, seizures, loss of consciousness, weakness and headaches.  Endo/Heme/Allergies: Does not bruise/bleed easily.  Psychiatric/Behavioral: Negative for depression, memory loss, substance abuse and suicidal ideas. The patient is not nervous/anxious and does not have insomnia.   All other systems reviewed and are negative.  14 point ROS was done and is otherwise as detailed above or in HPI   PHYSICAL EXAMINATION: ECOG PERFORMANCE STATUS: 0 - Asymptomatic  Vitals:   02/23/16 1000  BP: (!) 151/50  Pulse: 68  Resp: 18  Temp: 98.9 F (37.2 C)   There were no vitals filed for this visit.  Physical Exam  Constitutional: She is oriented to person, place, and time and well-developed, well-nourished, and in no distress.  HENT:  Head: Normocephalic and atraumatic.  Nose: Nose normal.  Mouth/Throat: Oropharynx is clear and moist. No oropharyngeal exudate.  Eyes: Conjunctivae and EOM are normal. Pupils are equal, round, and reactive to light. Right eye exhibits no discharge. Left eye exhibits no discharge. No scleral icterus.  Neck: Normal range of motion. Neck supple. No tracheal deviation present. No thyromegaly present.  Cardiovascular: Normal rate, regular rhythm and normal heart sounds.  Exam reveals no gallop and no friction rub.   No murmur heard. Light swelling in right arm, she has never worn a sleeve and glove. She notes the changes in her arm are chronic.  Pulmonary/Chest: Effort normal and breath sounds normal. She has no wheezes. She has no rales.    Abdominal: Soft. Bowel sounds are normal. She exhibits no distension and no mass. There is no tenderness. There is no rebound and no guarding.  Musculoskeletal: Normal range of motion. She exhibits no edema.  Lymphadenopathy:    She has no cervical adenopathy.  Neurological: She is alert and oriented to person,  place, and time. She has normal reflexes. No cranial nerve deficit. Gait normal. Coordination normal.  Skin: Skin is warm and dry. No rash noted.  Nothing palpated on left outer calf. No skin lesion noted  Psychiatric: Mood, memory, affect and judgment normal.  Nursing note and vitals reviewed.   LABORATORY DATA:  I have reviewed the data as listed Lab Results  Component Value Date   WBC 10.4 02/11/2015   HGB 13.6 02/11/2015   HCT 40.1 02/11/2015   MCV 90.7 02/11/2015   PLT 183 02/11/2015   CMP     Component Value Date/Time   NA 135 02/11/2015 1350   K 4.0 02/11/2015 1350   CL 100 (L) 02/11/2015 1350   CO2 26 02/11/2015 1350   GLUCOSE 230 (H) 02/11/2015 1350   BUN 12 02/11/2015 1350   CREATININE 0.89 02/11/2015 1350   CALCIUM 8.9 02/11/2015 1350   PROT 7.4 02/11/2015 1350   ALBUMIN 3.7 02/11/2015 1350   AST 17 02/11/2015 1350   ALT 12 (L) 02/11/2015 1350   ALKPHOS 61 02/11/2015 1350   BILITOT 0.9 02/11/2015 1350   GFRNONAA >60 02/11/2015 1350   GFRAA >60 02/11/2015 1350      RADIOGRAPHIC STUDIES: I have personally reviewed the radiological images as listed and agreed with the findings in the report.   CLINICAL DATA:  Screening.  EXAM: 2D DIGITAL SCREENING UNILATERAL LEFT MAMMOGRAM WITH CAD AND ADJUNCT TOMO  COMPARISON:  Previous exam(s).  ACR Breast Density Category b: There are scattered areas of fibroglandular density.  FINDINGS: There are no findings suspicious for malignancy. Images were processed with CAD.  IMPRESSION: No mammographic evidence of malignancy. A result letter of this screening mammogram will be mailed directly to the patient.  RECOMMENDATION: Screening mammogram in one year. (Code:SM-B-01Y)  BI-RADS CATEGORY  1: Negative.   Electronically Signed   By: Altamese Cabal M.D.   On: 01/25/2016 09:16    ASSESSMENT & PLAN:  Stage I R breast cancer (T1B, N0,M0) R Modified radical mastectomy Arimidex therapy starting  on 02/16/2009 History of PE after appendectomy in 1974 Osteopenia by DEXA 02/12/2014 Tobacco use Lymphedema R extremity  She is without obvious recurrence. Her mammograms are ordered by primary care, last mammogram was in June. She is up-to-date on mammography. Breast exam is benign. She completed 5  years of Arimidex and opted to discontinue this.  She has osteopenia and I have encouraged weightbearing exercise and ongoing calcium and vitamin D.  I have encouraged smoking cessation and we discussed ways she could quit. She is currently not interested in stopping smoking..  I will make a referral to lymphedema therapy.  She will return in 1 year for follow up.  All questions were answered. The patient knows to call the clinic with any problems, questions or concerns.   This document serves as a record of services personally performed by Ancil Linsey, MD. It was created on her behalf by Arlyce Harman, a trained medical scribe. The creation of this record is based on the scribe's personal observations and the provider's statements to them. This document has been checked and approved by the attending provider.  I have reviewed the above documentation for accuracy and completeness, and I agree with the above.  This note is electronically signed.  Kelby Fam. Whitney Muse, MD

## 2016-02-23 NOTE — Patient Instructions (Signed)
[name] at [facility] Discharge Instructions  RECOMMENDATIONS MADE BY THE CONSULTANT AND ANY TEST RESULTS WILL BE SENT TO YOUR REFERRING PHYSICIAN.  You saw Dr. Whitney Muse today. Return for follow up in one year. We are referring you to lymphedema clinic.  Thank you for choosing [place] at [facility] to provide your oncology and hematology care.  To afford each patient quality time with our provider, please arrive at least 15 minutes before your scheduled appointment time.   Beginning January 23rd 2017 lab work for the [facility] will be done in the  Main lab at [facility] on 1st floor. If you have a lab appointment with the [facility] please come in thru the  Main Entrance and check in at the main information desk  You need to re-schedule your appointment should you arrive 10 or more minutes late.  We strive to give you quality time with our providers, and arriving late affects you and other patients whose appointments are after yours.  Also, if you no show three or more times for appointments you may be dismissed from the clinic at the providers discretion.     Again, thank you for choosing [facility].  Our hope is that these requests will decrease the amount of time that you wait before being seen by our physicians.       _____________________________________________________________  Should you have questions after your visit to [facility], please contact our office at [phone] between the hours of 8:30 a.m. and 4:30 p.m.  Voicemails left after 4:30 p.m. will not be returned until the following business day.  For prescription refill requests, have your pharmacy contact our office.         Resources For Cancer Patients and their Caregivers ? American Cancer Society: Can assist with transportation, wigs, general needs, runs Look Good Feel Better.        712 368 3008 ? Cancer Care: Provides  financial assistance, online support groups, medication/co-pay assistance.  1-800-813-HOPE (605)735-3539) ? [place] Assists [place] cancer patients and their families through emotional , educational and financial support.  (731)390-6460 ? [place] DSS Where to apply for food stamps, Medicaid and utility assistance. 431-358-1119 ? [facility]: Transportation to medical appointments. 782-184-0110 ? Social Security Administration: May apply for disability if have a Stage IV cancer. 724 053 6120 534-397-1658 ? [employer], Disability and Transit Services: Assists with nutrition, care and transit needs. [place] Support Programs: '@10RELATIVEDAYS'$ @ > Cancer Support Group  2nd Tuesday of the month 1pm-2pm, Journey Room  > Creative Journey  3rd Tuesday of the month 1130am-1pm, Journey Room  > Look Good Feel Better  1st Wednesday of the month 10am-12 noon, Journey Room (Call [place] to register [phone])

## 2016-03-14 DIAGNOSIS — H2512 Age-related nuclear cataract, left eye: Secondary | ICD-10-CM | POA: Diagnosis not present

## 2016-03-14 DIAGNOSIS — H538 Other visual disturbances: Secondary | ICD-10-CM | POA: Diagnosis not present

## 2016-03-15 ENCOUNTER — Ambulatory Visit (HOSPITAL_COMMUNITY): Payer: Commercial Managed Care - HMO | Attending: Hematology & Oncology | Admitting: Physical Therapy

## 2016-03-15 DIAGNOSIS — I972 Postmastectomy lymphedema syndrome: Secondary | ICD-10-CM | POA: Diagnosis not present

## 2016-03-15 NOTE — Therapy (Signed)
[place], [place], [address] Phone: [phone]   Fax:  [phone]  Physical Therapy Evaluation  Patient Details  Name: [name] MRN: [id number] Date of Birth: 1941-05-14 Referring Provider: Ancil Linsey  Encounter Date: 03/15/2016      Lauren End of Session - 03/15/16 1105    Visit Number 1   Number of Visits 1   Authorization Type [employer] medicare   Authorization - Visit Number 1   Authorization - Number of Visits 1   Lauren Start Time 1324   Lauren Stop Time 1115   Lauren Time Calculation (min) 45 min   Activity Tolerance Patient tolerated treatment well   Behavior During Therapy [facility] for tasks assessed/performed      Past Medical History:  Diagnosis Date  . Breast CA ([place]) 01/16/2011  . Breast cancer ([place])    rt dx 2010; mastectomy only  . Embolism - blood clot    left lung  . Hypertension   . Infiltrating ductal carcinoma of breast ([place]) 01/16/2011   Started Arimidex on 02/24/2009. Stage IB (T1b N0 M0) grade 1 infiltrating ductal carcinoma of the right breast, status post right modified radical mastectomy on 01/22/2009 for a 10 mm cancer, ER +100%, PR +98%, KI-67 marker was 9%, HER-2 negative, no LVI identified, and 11 lymph nodes were negative, all margins were clear. She had some associated DCIS, is now on Arimidex and she will take that for  . Tendonitis 07/2010   shot of cortisone    Past Surgical History:  Procedure Laterality Date  . ABDOMINAL HYSTERECTOMY  1987  . APPENDECTOMY  1974   blood clot in lung  . BREAST BIOPSY  1968   left  . MASTECTOMY MODIFIED RADICAL  01/22/2009   rt  . NASAL SINUS SURGERY  2002  . TEAR DUCT PROBING    . tubal ligation  1972  . YAG LASER APPLICATION Right 10/30/270   Procedure: YAG LASER APPLICATION;  Surgeon: Williams Che, MD;  Location: [facility];  Service: Ophthalmology;  Laterality: Right;    There were no vitals filed for this visit.       Subjective  Assessment - 03/15/16 1030    Subjective [name] states that she was diagnosed with rt breast cancer  in 2012.  She had 11 lymphnodes removed.  Her swelling started two years ago but seemed to have subsided.   She states that sometimes it is  tight and other times she is alright.     Pertinent History HTN, Rt breast cancer, DM, osteroporosis    Pain Score 0-No pain  2/10   Pain Location Other (Comment)  sub axilla   Pain Descriptors / Indicators Sore   Pain Frequency Intermittent            [facility] Lauren Assessment - 03/15/16 0001      Assessment   Medical Diagnosis Rt UE lymphedema   Referring Provider Larene Beach [place]   Onset Date/Surgical Date 01/16/11   Hand Dominance Right   Next MD Visit unknown   Prior Therapy none     Precautions   Precautions --  Lymphedema precautions to Rt UE     Restrictions   Weight Bearing Restrictions No     Balance Screen   Has the patient fallen in the past 6 months No   Has the patient had a decrease in activity level because of a fear of falling?  No   Is the  patient reluctant to leave their home because of a fear of falling?  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      Cognition   Overall Cognitive Status Within Functional Limits for tasks assessed     Observation/Other Assessments   Focus on Therapeutic Outcomes (FOTO)  --  life impact      ROM / Strength   AROM / PROM / Strength AROM     AROM   Overall AROM  Within functional limits for tasks performed   Overall AROM Comments UE           LYMPHEDEMA/ONCOLOGY QUESTIONNAIRE - 03/15/16 1044      Type   Cancer Type Rt breast cancer.     Surgeries   Radical Mastectomy Date 01/16/11   Axillary Lymph Node Dissection Date 01/16/11   Number Lymph Nodes Removed 11     Date Lymphedema/Swelling Started   Date 01/28/14     Treatment    Past Hormone Therapy Yes   Drug Name Arimidex     What other symptoms do you have   Are you Having Heaviness or Tightness No  does at times   Are you having Pain No  does at times   Are you having pitting edema No   Is it Hard or Difficult finding clothes that fit No   Do you have infections No     Lymphedema Stage   Stage STAGE 1 SPONTANEOUSLY REVERSIBLE     Lymphedema Assessments   Lymphedema Assessments Upper extremities     Right Upper Extremity Lymphedema   At Axilla  32.2 cm   15 cm Proximal to Olecranon Process 29.7 cm   10 cm Proximal to Olecranon Process 28.7 cm   Olecranon Process 25 cm   15 cm Proximal to Ulnar Styloid Process 22.3 cm   10 cm Proximal to Ulnar Styloid Process 18.4 cm   Just Proximal to Ulnar Styloid Process 16.2 cm   Across Hand at [employer] 19.3 cm   At [place] of Thumb 16.4 cm     Left Upper Extremity Lymphedema   At Axilla  31 cm   15 cm Proximal to Olecranon Process 29.7 cm   10 cm Proximal to Olecranon Process 28.4 cm   Olecranon Process 24.3 cm   15 cm Proximal to Ulnar Styloid Process 21.4 cm   10 cm Proximal to Ulnar Styloid Process 18.3 cm   Just Proximal to Ulnar Styloid Process 15.5 cm   Across Hand at [employer] 18.5 cm   At [place] of Thumb 15.5 cm                        Lauren Education - 03/15/16 1104    Education provided Yes   Education Details given tips for lymphedema;  RT UE ROM exercises and Things to do after breast cancer sheets.   Person(s) Educated Patient   Methods Explanation   Comprehension Verbalized understanding;Returned demonstration          Lauren Short Term Goals - 03/15/16 1134      Lauren SHORT TERM GOAL #1   Title Lauren to be I in donning and doffing compression garment    Time 1   Period Days   Status Achieved     Lauren  SHORT TERM GOAL #2   Title Lauren to understand that there is no cure for lymphedema and if sx increase she should be referred back to physical therapy    Time 1   Period  Days   Status Achieved                  Plan - 03/15/16 1127    Clinical Impression Statement [name] is a 75 yo female with history of Rt breast cancer in June of 2012.   She had 11 axillary nodes removed all of which were (-).  She did not opt for radiation or chemo but rather hormonal treatment which has proved to be effective.  She noted slight increased swellig in her right arm two years ago.  She states that the swelling comes and goes and is never more than slight.  She comes to the departement with a compression garment that she has purchased.  Evaluation of her circumference shows minor changes.  [name] was educated about lymphedema.  It was explained that this is a progressive disease that has no cure but can be managed.  At this time I feel the use of the compression sleeve will be enough to control her symptoms.  She was given information on lymphedema, shown how to don and doff the compression garment and explained that if her symptoms increase that she should contact her MD and have her sent back to therapy as there are manual techniques that will decrease increased edema.  Lauren voiced understanding.    Rehab Potential Excellent   Lauren Frequency 1x / week   Lauren Duration --  1 week   Lauren Treatment/Interventions ADLs/Self Care Home Management   Lauren Next Visit Plan Lauren will be discharged to a home maintaince program.    Lauren Home Exercise Plan given       Patient will benefit from skilled therapeutic intervention in order to improve the following deficits and impairments:  Increased edema  Visit Diagnosis: Postmastectomy lymphedema - Plan: Lauren plan of care cert/re-cert      G-Codes - 51/02/58 1135    Functional Assessment Tool Used impact score as well as measurements    Functional Limitation Other Lauren primary   Other Lauren Primary Current Status (N2778) At least 1 percent but less than 20 percent impaired, limited or restricted   Other Lauren Primary Goal Status (E4235) At least 1  percent but less than 20 percent impaired, limited or restricted   Other Lauren Primary Discharge Status (T6144) At least 1 percent but less than 20 percent impaired, limited or restricted       Problem List Patient Active Problem List   Diagnosis Date Noted  . Osteopenia 01/25/2014  . Embolism - blood clot   . Infiltrating ductal carcinoma of breast ([place]) 01/16/2011  . Tendonitis   . DEQUERVAIN'S 08/10/2010   [name], [name] [phone] 03/15/2016, 11:40 AM  [place], [place], [address] Phone: [phone]   Fax:  [phone]  Name: [name] MRN: [id number] Date of Birth: 21-Apr-1941

## 2016-03-17 ENCOUNTER — Encounter (HOSPITAL_COMMUNITY): Payer: Self-pay | Admitting: Hematology & Oncology

## 2016-03-22 ENCOUNTER — Encounter (HOSPITAL_COMMUNITY): Payer: Self-pay | Admitting: Emergency Medicine

## 2016-03-22 ENCOUNTER — Emergency Department (HOSPITAL_COMMUNITY): Payer: Commercial Managed Care - HMO

## 2016-03-22 ENCOUNTER — Inpatient Hospital Stay (HOSPITAL_COMMUNITY): Payer: Commercial Managed Care - HMO

## 2016-03-22 ENCOUNTER — Inpatient Hospital Stay (HOSPITAL_COMMUNITY)
Admission: EM | Admit: 2016-03-22 | Discharge: 2016-03-24 | DRG: 065 | Disposition: A | Discharge status: Home Health | Payer: Commercial Managed Care - HMO | Attending: Internal Medicine | Admitting: Internal Medicine

## 2016-03-22 DIAGNOSIS — F17200 Nicotine dependence, unspecified, uncomplicated: Secondary | ICD-10-CM

## 2016-03-22 DIAGNOSIS — Z90710 Acquired absence of both cervix and uterus: Secondary | ICD-10-CM

## 2016-03-22 DIAGNOSIS — I6350 Cerebral infarction due to unspecified occlusion or stenosis of unspecified cerebral artery: Secondary | ICD-10-CM | POA: Diagnosis not present

## 2016-03-22 DIAGNOSIS — I63521 Cerebral infarction due to unspecified occlusion or stenosis of right anterior cerebral artery: Secondary | ICD-10-CM

## 2016-03-22 DIAGNOSIS — G8322 Monoplegia of upper limb affecting left dominant side: Secondary | ICD-10-CM | POA: Diagnosis not present

## 2016-03-22 DIAGNOSIS — Z86711 Personal history of pulmonary embolism: Secondary | ICD-10-CM

## 2016-03-22 DIAGNOSIS — Z7951 Long term (current) use of inhaled steroids: Secondary | ICD-10-CM

## 2016-03-22 DIAGNOSIS — Z7982 Long term (current) use of aspirin: Secondary | ICD-10-CM

## 2016-03-22 DIAGNOSIS — E1165 Type 2 diabetes mellitus with hyperglycemia: Secondary | ICD-10-CM | POA: Diagnosis not present

## 2016-03-22 DIAGNOSIS — F17210 Nicotine dependence, cigarettes, uncomplicated: Secondary | ICD-10-CM | POA: Diagnosis present

## 2016-03-22 DIAGNOSIS — G8194 Hemiplegia, unspecified affecting left nondominant side: Secondary | ICD-10-CM | POA: Diagnosis not present

## 2016-03-22 DIAGNOSIS — I639 Cerebral infarction, unspecified: Secondary | ICD-10-CM | POA: Diagnosis not present

## 2016-03-22 DIAGNOSIS — M6289 Other specified disorders of muscle: Secondary | ICD-10-CM | POA: Diagnosis not present

## 2016-03-22 DIAGNOSIS — R05 Cough: Secondary | ICD-10-CM | POA: Diagnosis not present

## 2016-03-22 DIAGNOSIS — Z79899 Other long term (current) drug therapy: Secondary | ICD-10-CM

## 2016-03-22 DIAGNOSIS — Z9011 Acquired absence of right breast and nipple: Secondary | ICD-10-CM

## 2016-03-22 DIAGNOSIS — E119 Type 2 diabetes mellitus without complications: Secondary | ICD-10-CM | POA: Diagnosis not present

## 2016-03-22 DIAGNOSIS — R531 Weakness: Secondary | ICD-10-CM | POA: Diagnosis not present

## 2016-03-22 DIAGNOSIS — Z800 Family history of malignant neoplasm of digestive organs: Secondary | ICD-10-CM | POA: Diagnosis not present

## 2016-03-22 DIAGNOSIS — Z720 Tobacco use: Secondary | ICD-10-CM | POA: Diagnosis present

## 2016-03-22 DIAGNOSIS — E1169 Type 2 diabetes mellitus with other specified complication: Secondary | ICD-10-CM

## 2016-03-22 DIAGNOSIS — I6789 Other cerebrovascular disease: Secondary | ICD-10-CM | POA: Diagnosis not present

## 2016-03-22 DIAGNOSIS — I10 Essential (primary) hypertension: Secondary | ICD-10-CM

## 2016-03-22 DIAGNOSIS — Z853 Personal history of malignant neoplasm of breast: Secondary | ICD-10-CM | POA: Diagnosis not present

## 2016-03-22 DIAGNOSIS — I6522 Occlusion and stenosis of left carotid artery: Secondary | ICD-10-CM | POA: Diagnosis not present

## 2016-03-22 DIAGNOSIS — Z803 Family history of malignant neoplasm of breast: Secondary | ICD-10-CM

## 2016-03-22 DIAGNOSIS — Z7984 Long term (current) use of oral hypoglycemic drugs: Secondary | ICD-10-CM

## 2016-03-22 DIAGNOSIS — E785 Hyperlipidemia, unspecified: Secondary | ICD-10-CM

## 2016-03-22 LAB — COMPREHENSIVE METABOLIC PANEL
ALBUMIN: 4.2 g/dL (ref 3.5–5.0)
ALK PHOS: 45 U/L (ref 38–126)
ALT: 12 U/L — ABNORMAL LOW (ref 14–54)
ANION GAP: 7 (ref 5–15)
AST: 22 U/L (ref 15–41)
BUN: 9 mg/dL (ref 6–20)
CALCIUM: 9.2 mg/dL (ref 8.9–10.3)
CHLORIDE: 102 mmol/L (ref 101–111)
CO2: 25 mmol/L (ref 22–32)
Creatinine, Ser: 0.73 mg/dL (ref 0.44–1.00)
GFR calc non Af Amer: >60 mL/min (ref >60)
GLUCOSE: 135 mg/dL — AB (ref 65–99)
POTASSIUM: 4.2 mmol/L (ref 3.5–5.1)
SODIUM: 134 mmol/L — AB (ref 135–145)
Total Bilirubin: 0.7 mg/dL (ref 0.3–1.2)
Total Protein: 7.8 g/dL (ref 6.5–8.1)

## 2016-03-22 LAB — I-STAT TROPONIN, ED: Troponin i, poc: 0 ng/mL (ref 0.00–0.08)

## 2016-03-22 LAB — DIFFERENTIAL
BASOS PCT: 0 %
Basophils Absolute: 0 10*3/uL (ref 0.0–0.1)
EOS PCT: 2 %
Eosinophils Absolute: 0.2 10*3/uL (ref 0.0–0.7)
LYMPHS PCT: 23 %
Lymphs Abs: 1.7 10*3/uL (ref 0.7–4.0)
Monocytes Absolute: 0.5 10*3/uL (ref 0.1–1.0)
Monocytes Relative: 7 %
NEUTROS PCT: 68 %
Neutro Abs: 4.9 10*3/uL (ref 1.7–7.7)

## 2016-03-22 LAB — CBC
HCT: 42.2 % (ref 36.0–46.0)
Hemoglobin: 14.1 g/dL (ref 12.0–15.0)
MCH: 30.7 pg (ref 26.0–34.0)
MCHC: 33.4 g/dL (ref 30.0–36.0)
MCV: 91.9 fL (ref 78.0–100.0)
Platelets: 162 K/uL (ref 150–400)
RBC: 4.59 MIL/uL (ref 3.87–5.11)
RDW: 13.4 % (ref 11.5–15.5)
WBC: 7.3 K/uL (ref 4.0–10.5)

## 2016-03-22 LAB — I-STAT CHEM 8, ED
BUN: 8 mg/dL (ref 6–20)
CHLORIDE: 100 mmol/L — AB (ref 101–111)
Calcium, Ion: 1.19 mmol/L (ref 1.12–1.23)
Creatinine, Ser: 0.7 mg/dL (ref 0.44–1.00)
Glucose, Bld: 135 mg/dL — ABNORMAL HIGH (ref 65–99)
HEMATOCRIT: 43 % (ref 36.0–46.0)
HEMOGLOBIN: 14.6 g/dL (ref 12.0–15.0)
POTASSIUM: 4.3 mmol/L (ref 3.5–5.1)
SODIUM: 139 mmol/L (ref 135–145)
TCO2: 25 mmol/L (ref 0–100)

## 2016-03-22 LAB — CBG MONITORING, ED: GLUCOSE-CAPILLARY: 128 mg/dL — AB (ref 65–99)

## 2016-03-22 LAB — GLUCOSE, CAPILLARY: GLUCOSE-CAPILLARY: 149 mg/dL — AB (ref 65–99)

## 2016-03-22 LAB — APTT: aPTT: 31 s (ref 24–36)

## 2016-03-22 LAB — PROTIME-INR
INR: 0.93
Prothrombin Time: 12.5 s (ref 11.4–15.2)

## 2016-03-22 MED ORDER — METOPROLOL TARTRATE 25 MG PO TABS
25.0000 mg | ORAL_TABLET | Freq: Every day | ORAL | Status: DC
  Administered 2016-03-23 – 2016-03-24 (×2): 25 mg via ORAL
  Filled 2016-03-22 (×2): qty 1

## 2016-03-22 MED ORDER — ENOXAPARIN SODIUM 40 MG/0.4ML ~~LOC~~ SOLN
40.0000 mg | SUBCUTANEOUS | Status: DC
  Administered 2016-03-22 – 2016-03-23 (×2): 40 mg via SUBCUTANEOUS
  Filled 2016-03-22 (×2): qty 0.4

## 2016-03-22 MED ORDER — PANTOPRAZOLE SODIUM 40 MG PO TBEC
40.0000 mg | DELAYED_RELEASE_TABLET | Freq: Every day | ORAL | Status: DC
  Administered 2016-03-23 – 2016-03-24 (×2): 40 mg via ORAL
  Filled 2016-03-22 (×3): qty 1

## 2016-03-22 MED ORDER — AMLODIPINE BESYLATE 5 MG PO TABS
5.0000 mg | ORAL_TABLET | Freq: Every day | ORAL | Status: DC
  Administered 2016-03-23 – 2016-03-24 (×2): 5 mg via ORAL
  Filled 2016-03-22 (×2): qty 1

## 2016-03-22 MED ORDER — SODIUM CHLORIDE 0.9 % IV SOLN
INTRAVENOUS | Status: DC
  Administered 2016-03-22: 18:00:00 via INTRAVENOUS

## 2016-03-22 MED ORDER — STROKE: EARLY STAGES OF RECOVERY BOOK
Freq: Once | Status: AC
  Administered 2016-03-22: 17:00:00
  Filled 2016-03-22: qty 1

## 2016-03-22 MED ORDER — ASPIRIN 300 MG RE SUPP: 300.0000 mg | Freq: Every day | RECTAL | Status: DC

## 2016-03-22 MED ORDER — LINAGLIPTIN 5 MG PO TABS
5.0000 mg | ORAL_TABLET | Freq: Every day | ORAL | Status: DC
  Administered 2016-03-23 – 2016-03-24 (×2): 5 mg via ORAL
  Filled 2016-03-22 (×2): qty 1

## 2016-03-22 MED ORDER — ONDANSETRON HCL 4 MG/2ML IJ SOLN
4.0000 mg | Freq: Four times a day (QID) | INTRAMUSCULAR | Status: DC | PRN
  Administered 2016-03-22: 4 mg via INTRAVENOUS
  Filled 2016-03-22: qty 2

## 2016-03-22 MED ORDER — GABAPENTIN 300 MG PO CAPS
300.0000 mg | ORAL_CAPSULE | Freq: Every day | ORAL | Status: DC
  Administered 2016-03-22 – 2016-03-23 (×2): 300 mg via ORAL
  Filled 2016-03-22 (×2): qty 1

## 2016-03-22 MED ORDER — SENNOSIDES-DOCUSATE SODIUM 8.6-50 MG PO TABS: 1.0000 | ORAL_TABLET | Freq: Every evening | ORAL | Status: DC | PRN

## 2016-03-22 MED ORDER — INSULIN ASPART 100 UNIT/ML ~~LOC~~ SOLN: 0.0000 [IU] | Freq: Every day | SUBCUTANEOUS | Status: DC

## 2016-03-22 MED ORDER — ASPIRIN 325 MG PO TABS
325.0000 mg | ORAL_TABLET | Freq: Every day | ORAL | Status: DC
  Administered 2016-03-23 – 2016-03-24 (×2): 325 mg via ORAL
  Filled 2016-03-22 (×2): qty 1

## 2016-03-22 MED ORDER — CELECOXIB 100 MG PO CAPS
100.0000 mg | ORAL_CAPSULE | Freq: Two times a day (BID) | ORAL | Status: DC
  Administered 2016-03-22: 100 mg via ORAL
  Filled 2016-03-22: qty 1

## 2016-03-22 MED ORDER — INSULIN ASPART 100 UNIT/ML ~~LOC~~ SOLN
0.0000 [IU] | Freq: Three times a day (TID) | SUBCUTANEOUS | Status: DC
  Administered 2016-03-23: 2 [IU] via SUBCUTANEOUS

## 2016-03-22 MED ORDER — METFORMIN HCL 500 MG PO TABS
500.0000 mg | ORAL_TABLET | Freq: Every day | ORAL | Status: DC
  Administered 2016-03-22 – 2016-03-23 (×2): 500 mg via ORAL
  Filled 2016-03-22 (×2): qty 1

## 2016-03-22 MED ORDER — PRAVASTATIN SODIUM 40 MG PO TABS
40.0000 mg | ORAL_TABLET | Freq: Every day | ORAL | Status: DC
  Administered 2016-03-22 – 2016-03-23 (×2): 40 mg via ORAL
  Filled 2016-03-22 (×2): qty 1

## 2016-03-22 MED ORDER — ACETAMINOPHEN 325 MG PO TABS: 650.0000 mg | ORAL_TABLET | ORAL | Status: DC | PRN

## 2016-03-22 MED ORDER — ACETAMINOPHEN 650 MG RE SUPP: 650.0000 mg | RECTAL | Status: DC | PRN

## 2016-03-22 NOTE — H&P (Signed)
History and Physical    [name]:[id number] DOB: [birth year] DOA: 03/22/2016  PCP: Rosita Fire, MD  Patient coming from: home  Chief Complaint: left sided weakness  HPI: [name] is a 75 y.o. female with medical history significant of HTN, HLD, DM and tobacco abuse. Patient reports that yesterday morning she work up with left sided numbness and heaviness in her left upper and lower extremity. She felt as though her left sided had "gone to sleep" and was waiting for symptoms to resolve. She did not seek medical care at that time. She went to bed and woke up this morning with worsening symptoms of left sided weakness, difficulty walking and dropping things in her left hand. She denies any acute changes in vision, difficulty swallowing or speaking. She went to her primary care physician and was directed to come to the hospital.  ED Course: In ED, MRI brain confirmed acute stroke. She has been referred for further management  Review of Systems: As per HPI otherwise 10 point review of systems negative.    Past Medical History:  Diagnosis Date  . Breast CA ([place]) 01/16/2011  . Breast cancer ([place])    rt dx 2010; mastectomy only  . Embolism - blood clot    left lung  . Hypertension   . Infiltrating ductal carcinoma of breast ([place]) 01/16/2011   Started Arimidex on 02/24/2009. Stage IB (T1b N0 M0) grade 1 infiltrating ductal carcinoma of the right breast, status post right modified radical mastectomy on 01/22/2009 for a 10 mm cancer, ER +100%, PR +98%, KI-67 marker was 9%, HER-2 negative, no LVI identified, and 11 lymph nodes were negative, all margins were clear. She had some associated DCIS, is now on Arimidex and she will take that for  . Tendonitis 07/2010   shot of cortisone    Past Surgical History:  Procedure Laterality Date  . ABDOMINAL HYSTERECTOMY  1987  . APPENDECTOMY  1974   blood clot in lung  . BREAST BIOPSY  1968   left  . MASTECTOMY MODIFIED RADICAL  01/22/2009   rt   . NASAL SINUS SURGERY  2002  . TEAR DUCT PROBING    . tubal ligation  1972  . YAG LASER APPLICATION Right 1/62/4469   Procedure: YAG LASER APPLICATION;  Surgeon: Williams Che, MD;  Location: [facility];  Service: Ophthalmology;  Laterality: Right;     reports that she has been smoking Cigarettes.  She has been smoking about 0.50 packs per day. She has never used smokeless tobacco. She reports that she does not drink alcohol. Her drug history is not on file.  Allergies  Allergen Reactions  . Pollen Extract Other (See Comments)    Runny nose, watery eyes and ears get stopped up    Family History  Problem Relation Age of Onset  . Breast cancer Sister   . Breast cancer Maternal Grandmother   . Colon cancer Daughter     Prior to Admission medications   Medication Sig Start Date End Date Taking? Authorizing Provider  amLODipine (NORVASC) 5 MG tablet Take 5 mg by mouth daily.   Yes Historical Provider, MD  aspirin-sod bicarb-citric acid (ALKA-SELTZER) 325 MG TBEF tablet Take 650 mg by mouth daily as needed (cold).   Yes Historical Provider, MD  Calcium Carb-Cholecalciferol (CALCIUM + D3 PO) Take 1 capsule by mouth 2 (two) times daily.    Yes Historical Provider, MD  celecoxib (CELEBREX) 100 MG capsule Take 1 capsule (100 mg total) by  mouth 2 (two) times daily. 09/16/14  Yes Milton Ferguson, MD  fluticasone Asencion Islam) 50 MCG/ACT nasal spray  01/12/16  Yes Historical Provider, MD  gabapentin (NEURONTIN) 300 MG capsule Take 300 mg by mouth at bedtime.  02/12/16  Yes Historical Provider, MD  ibuprofen (ADVIL,MOTRIN) 200 MG tablet Take 600 mg by mouth every 6 (six) hours as needed for moderate pain.   Yes Historical Provider, MD  lovastatin (MEVACOR) 40 MG tablet Take 40 mg by mouth at bedtime.    Yes Historical Provider, MD  metFORMIN (GLUCOPHAGE) 500 MG tablet Take 500 mg by mouth at bedtime.  02/12/16  Yes Historical Provider, MD  metoprolol tartrate (LOPRESSOR) 25 MG tablet Take 25 mg by mouth  daily.    Yes Historical Provider, MD  naphazoline-glycerin (CLEAR EYES) 0.012-0.2 % SOLN Place 2 drops into both eyes daily as needed for irritation.   Yes Historical Provider, MD  omeprazole (PRILOSEC) 20 MG capsule Take 1 capsule by mouth daily. 06/28/13  Yes Historical Provider, MD  TRADJENTA 5 MG TABS tablet Take 5 mg by mouth daily.  02/12/16  Yes Historical Provider, MD    Physical Exam: Vitals:   03/22/16 1400 03/22/16 1412 03/22/16 1503 03/22/16 1547  BP: 132/78 132/78  (!) 157/65  Pulse: 70 71  69  Resp: 23 16  18   Temp:   98.1 F (36.7 C) 98.1 F (36.7 C)  TempSrc:    Oral  SpO2: 96% 96%  100%  Weight:      Height:    5' 5"  (1.651 m)      Constitutional: NAD, calm, comfortable Vitals:   03/22/16 1400 03/22/16 1412 03/22/16 1503 03/22/16 1547  BP: 132/78 132/78  (!) 157/65  Pulse: 70 71  69  Resp: 23 16  18   Temp:   98.1 F (36.7 C) 98.1 F (36.7 C)  TempSrc:    Oral  SpO2: 96% 96%  100%  Weight:      Height:    5' 5"  (1.651 m)   Eyes: PERRL, lids and conjunctivae normal ENMT: Mucous membranes are moist. Posterior pharynx clear of any exudate or lesions.Normal dentition.  Neck: normal, supple, no masses, no thyromegaly Respiratory: clear to auscultation bilaterally, no wheezing, no crackles. Normal respiratory effort. No accessory muscle use.  Cardiovascular: Regular rate and rhythm, no murmurs / rubs / gallops. No extremity edema. 2+ pedal pulses. No carotid bruits.  Abdomen: no tenderness, no masses palpated. No hepatosplenomegaly. Bowel sounds positive.  Musculoskeletal: no clubbing / cyanosis. No joint deformity upper and lower extremities. Good ROM, no contractures. Normal muscle tone.  Skin: no rashes, lesions, ulcers. No induration Neurologic: CN 2-12 grossly intact. Strength is 5/5 in right upper and lower extremity, 2-3/5 in left upper and lower extremities.Marland Kitchen  Psychiatric: Normal judgment and insight. Alert and oriented x 3. Normal mood.   Labs on  Admission: I have personally reviewed following labs and imaging studies  CBC:  Recent Labs Lab 03/22/16 1227 03/22/16 1235  WBC 7.3  --   NEUTROABS 4.9  --   HGB 14.1 14.6  HCT 42.2 43.0  MCV 91.9  --   PLT 162  --    Basic Metabolic Panel:  Recent Labs Lab 03/22/16 1227 03/22/16 1235  NA 134* 139  K 4.2 4.3  CL 102 100*  CO2 25  --   GLUCOSE 135* 135*  BUN 9 8  CREATININE 0.73 0.70  CALCIUM 9.2  --    GFR: Estimated Creatinine Clearance: 53.8 mL/min (  by C-G formula based on SCr of 0.8 mg/dL). Liver Function Tests:  Recent Labs Lab 03/22/16 1227  AST 22  ALT 12*  ALKPHOS 45  BILITOT 0.7  PROT 7.8  ALBUMIN 4.2   No results for input(s): LIPASE, AMYLASE in the last 168 hours. No results for input(s): AMMONIA in the last 168 hours. Coagulation Profile:  Recent Labs Lab 03/22/16 1227  INR 0.93   Cardiac Enzymes: No results for input(s): CKTOTAL, CKMB, CKMBINDEX, TROPONINI in the last 168 hours. BNP (last 3 results) No results for input(s): PROBNP in the last 8760 hours. HbA1C: No results for input(s): HGBA1C in the last 72 hours. CBG:  Recent Labs Lab 03/22/16 1221  GLUCAP 128*   Lipid Profile: No results for input(s): CHOL, HDL, LDLCALC, TRIG, CHOLHDL, LDLDIRECT in the last 72 hours. Thyroid Function Tests: No results for input(s): TSH, T4TOTAL, FREET4, T3FREE, THYROIDAB in the last 72 hours. Anemia Panel: No results for input(s): VITAMINB12, FOLATE, FERRITIN, TIBC, IRON, RETICCTPCT in the last 72 hours. Urine analysis:    Component Value Date/Time   COLORURINE YELLOW 08/02/2013 1440   APPEARANCEUR CLEAR 08/02/2013 1440   LABSPEC <1.005 (L) 08/02/2013 1440   PHURINE 6.5 08/02/2013 1440   GLUCOSEU NEGATIVE 08/02/2013 1440   HGBUR NEGATIVE 08/02/2013 1440   BILIRUBINUR NEGATIVE 08/02/2013 1440   KETONESUR NEGATIVE 08/02/2013 1440   PROTEINUR NEGATIVE 08/02/2013 1440   UROBILINOGEN 0.2 08/02/2013 1440   NITRITE NEGATIVE 08/02/2013 1440     LEUKOCYTESUR NEGATIVE 08/02/2013 1440   Sepsis Labs: !!!!!!!!!!!!!!!!!!!!!!!!!!!!!!!!!!!!!!!!!!!! @LABRCNTIP (procalcitonin:4,lacticidven:4) )No results found for this or any previous visit (from the past 240 hour(s)).   Radiological Exams on Admission: Mr Virgel Paling ZJ Contrast  Result Date: 03/22/2016 CLINICAL DATA:  75 year old female with 2 days of left side weakness. Initial encounter. EXAM: MRI HEAD WITHOUT CONTRAST MRA HEAD WITHOUT CONTRAST TECHNIQUE: Multiplanar, multiecho pulse sequences of the brain and surrounding structures were obtained without intravenous contrast. Angiographic images of the head were obtained using MRA technique without contrast. COMPARISON:  Head CT without contrast 01/11/2016. FINDINGS: MRI HEAD FINDINGS 17 mm focus of restricted diffusion in the right frontal lobe white matter extending from the right corona radiata toward the subcortical white matter. Associated T2 and FLAIR hyperintensity (series 7, image 17) without hemorrhage or mass effect. No other restricted diffusion. Major intracranial vascular flow voids appear normal. No midline shift, mass effect, evidence of mass lesion, ventriculomegaly, extra-axial collection or acute intracranial hemorrhage. Cervicomedullary junction and pituitary are within normal limits. Negative visualized cervical spine. Outside of the acute findings stated above, there is patchy nonspecific white matter T2 and FLAIR hyperintensity in both hemispheres. No cortical encephalomalacia or chronic cerebral blood products are identified. Bilateral deep gray matter nuclei, the brainstem, and cerebellum appear normal for age. Visible internal auditory structures appear normal. Visualized paranasal sinuses and mastoids are stable and well pneumatized. Postoperative changes to the right globe. Negative orbit and scalp soft tissues. MRA HEAD FINDINGS Antegrade flow in the posterior circulation. The distal left vertebral artery is dominant. Both  PICA origins are patent. Patent vertebrobasilar junction and basilar artery. SCA origins appear patent. Normal right PCA origin. Fetal origin of the left PCA. Right posterior communicating artery is diminutive or absent. Bilateral PCA branches appear normal. Antegrade flow in both ICA siphons. There is motion artifact at the level of the vertical petrous segments which is felt to explain signal loss on the right. Siphon irregularity otherwise compatible with atherosclerosis but no hemodynamically significant stenosis suspected. Ophthalmic and  left posterior communicating artery origins are normal. Patent carotid termini. MCA and ACA origins are within normal limits. Anterior communicating artery is diminutive or absent. Visualized ACA branches are within normal limits. Right MCA M1 segment and visualized right MCA branches are within normal limits. No right MCA branch occlusion is identified. On the left apparent mild to moderate stenosis at the left MCA bifurcation may be artifact due to motion. Otherwise the visualized left MCA branches are within normal limits. IMPRESSION: 1. Small acute white matter infarct in the right frontal lobe with no hemorrhage or mass effect. 2. Mildly motion degraded MRA is negative for age. No emergent large vessel occlusion. 3. Mild to moderate for age other nonspecific cerebral white matter signal changes, most commonly due to chronic small vessel disease. Electronically Signed   By: Genevie Ann M.D.   On: 03/22/2016 13:52   Mr Brain Wo Contrast  Result Date: 03/22/2016 CLINICAL DATA:  75 year old female with 2 days of left side weakness. Initial encounter. EXAM: MRI HEAD WITHOUT CONTRAST MRA HEAD WITHOUT CONTRAST TECHNIQUE: Multiplanar, multiecho pulse sequences of the brain and surrounding structures were obtained without intravenous contrast. Angiographic images of the head were obtained using MRA technique without contrast. COMPARISON:  Head CT without contrast 01/11/2016.  FINDINGS: MRI HEAD FINDINGS 17 mm focus of restricted diffusion in the right frontal lobe white matter extending from the right corona radiata toward the subcortical white matter. Associated T2 and FLAIR hyperintensity (series 7, image 17) without hemorrhage or mass effect. No other restricted diffusion. Major intracranial vascular flow voids appear normal. No midline shift, mass effect, evidence of mass lesion, ventriculomegaly, extra-axial collection or acute intracranial hemorrhage. Cervicomedullary junction and pituitary are within normal limits. Negative visualized cervical spine. Outside of the acute findings stated above, there is patchy nonspecific white matter T2 and FLAIR hyperintensity in both hemispheres. No cortical encephalomalacia or chronic cerebral blood products are identified. Bilateral deep gray matter nuclei, the brainstem, and cerebellum appear normal for age. Visible internal auditory structures appear normal. Visualized paranasal sinuses and mastoids are stable and well pneumatized. Postoperative changes to the right globe. Negative orbit and scalp soft tissues. MRA HEAD FINDINGS Antegrade flow in the posterior circulation. The distal left vertebral artery is dominant. Both PICA origins are patent. Patent vertebrobasilar junction and basilar artery. SCA origins appear patent. Normal right PCA origin. Fetal origin of the left PCA. Right posterior communicating artery is diminutive or absent. Bilateral PCA branches appear normal. Antegrade flow in both ICA siphons. There is motion artifact at the level of the vertical petrous segments which is felt to explain signal loss on the right. Siphon irregularity otherwise compatible with atherosclerosis but no hemodynamically significant stenosis suspected. Ophthalmic and left posterior communicating artery origins are normal. Patent carotid termini. MCA and ACA origins are within normal limits. Anterior communicating artery is diminutive or absent.  Visualized ACA branches are within normal limits. Right MCA M1 segment and visualized right MCA branches are within normal limits. No right MCA branch occlusion is identified. On the left apparent mild to moderate stenosis at the left MCA bifurcation may be artifact due to motion. Otherwise the visualized left MCA branches are within normal limits. IMPRESSION: 1. Small acute white matter infarct in the right frontal lobe with no hemorrhage or mass effect. 2. Mildly motion degraded MRA is negative for age. No emergent large vessel occlusion. 3. Mild to moderate for age other nonspecific cerebral white matter signal changes, most commonly due to chronic small vessel disease.  Electronically Signed   By: Genevie Ann M.D.   On: 03/22/2016 13:52    EKG: Independently reviewed. Sinus rhythm without acute changes  Assessment/Plan Active Problems:   Stroke (cerebrum) (HCC)   Tobacco use disorder   Diabetes mellitus type 2 in nonobese (HCC)   Benign essential HTN   Hyperlipidemia   Acute left-sided weakness  1. Acute left frontal infarct. Confirmed on MRI. MRA head did not show any large vessel occlusion. Will pursue remainder of work up including carotid dopplers and echo. Check A1C and lipid panel. She reports taking aspirin 65m daily. Will increase to 325mdaily. Consult neurology. She is not a candidate for tPA due to delay in patient arrival.  2. Left sided weakness, related to stroke. PT/OT have been consulted  3. DM. Continue on metformin. Will start on sliding scale inuslin   4. HLD. Continue on statin  5. HTN. Continue outpatient regimen  6. Tobacco abuse. Counseled on the importance of tobacco cessation.   DVT prophylaxis: lovenox Code Status: full code Family Communication: discussed with daughters at the bedside Disposition Plan: discharge home once improved Consults called: neurology Admission status: inpatient telemetry   MEArnold [facility] ChildrenD [facility] Pager [phone] 7PM-7AM, please contact night-coverage [email or web address] Password [email or web address] [facility] Rehabilitation Hospital The Vintage8/23/2017, 5:15 PM

## 2016-03-22 NOTE — ED Triage Notes (Signed)
Pt sent over by Dr. Legrand Rams for LT sided weakness/numbness. States she woke up at 0700 yesterday morning with LT arm weakness and LT facial numbness. States this morning when she woke up, her leg was weaker than yesterday. No facial droop or slurred speech noted.

## 2016-03-22 NOTE — ED Notes (Signed)
cbg of 128.

## 2016-03-22 NOTE — ED Provider Notes (Signed)
[facility] DEPT Provider Note   CSN: [id number] Arrival date & time: 03/22/16  1159   By signing my name below, I, [name], attest that this documentation has been prepared under the direction and in the presence of Milton Ferguson, MD . Electronically Signed: [name], Scribe. 03/22/2016. 12:22 PM.   History   Chief Complaint Chief Complaint  Patient presents with  . Weakness  . Numbness    Patient develop with weakness to her left arm left leg and thumbs slurred speech yesterday   The history is provided by the patient. No language interpreter was used.  Weakness  This is a new problem. The current episode started yesterday. The problem occurs constantly. The problem has not changed since onset.Pertinent negatives include no chest pain, no abdominal pain and no headaches. Nothing aggravates the symptoms. Nothing relieves the symptoms. She has tried nothing for the symptoms.     HPI Comments:  [name] is a 75 y.o. female who presents to the Emergency Department complaining of constant, left sided weakness which began yesterday AM. She denies speech changes but notes her tongue feels thick. Pt has no other complaints or symptoms at this time; denies pain. No alleviating factors noted. She lives with disabled son.    Past Medical History:  Diagnosis Date  . Breast CA ([place]) 01/16/2011  . Breast cancer ([place])    rt dx 2010; mastectomy only  . Embolism - blood clot    left lung  . Hypertension   . Infiltrating ductal carcinoma of breast ([place]) 01/16/2011   Started Arimidex on 02/24/2009. Stage IB (T1b N0 M0) grade 1 infiltrating ductal carcinoma of the right breast, status post right modified radical mastectomy on 01/22/2009 for a 10 mm cancer, ER +100%, PR +98%, KI-67 marker was 9%, HER-2 negative, no LVI identified, and 11 lymph nodes were negative, all margins were clear. She had some associated DCIS, is now on Arimidex and she will take that for  . Tendonitis  07/2010   shot of cortisone    Patient Active Problem List   Diagnosis Date Noted  . Osteopenia 01/25/2014  . Embolism - blood clot   . Infiltrating ductal carcinoma of breast ([place]) 01/16/2011  . Tendonitis   . DEQUERVAIN'S 08/10/2010    Past Surgical History:  Procedure Laterality Date  . ABDOMINAL HYSTERECTOMY  1987  . APPENDECTOMY  1974   blood clot in lung  . BREAST BIOPSY  1968   left  . MASTECTOMY MODIFIED RADICAL  01/22/2009   rt  . NASAL SINUS SURGERY  2002  . TEAR DUCT PROBING    . tubal ligation  1972  . YAG LASER APPLICATION Right 8/54/6270   Procedure: YAG LASER APPLICATION;  Surgeon: Williams Che, MD;  Location: [facility];  Service: Ophthalmology;  Laterality: Right;    OB History    No data available       Home Medications    Prior to Admission medications   Medication Sig Start Date End Date Taking? Authorizing Provider  amLODipine (NORVASC) 5 MG tablet Take 5 mg by mouth daily.    Historical Provider, MD  anastrozole (ARIMIDEX) 1 MG tablet Take 1 tablet (1 mg total) by mouth daily. 07/29/13   Baird Cancer, PA-C  aspirin-sod bicarb-citric acid (ALKA-SELTZER) 325 MG TBEF tablet Take 650 mg by mouth daily as needed (cold).    Historical Provider, MD  Calcium Carb-Cholecalciferol (CALCIUM + D3 PO) Take 1 capsule by mouth 2 (two) times daily.  Historical Provider, MD  celecoxib (CELEBREX) 100 MG capsule Take 1 capsule (100 mg total) by mouth 2 (two) times daily. 09/16/14   Milton Ferguson, MD  fluticasone Asencion Islam) 50 MCG/ACT nasal spray  01/12/16   Historical Provider, MD  gabapentin (NEURONTIN) 300 MG capsule  02/12/16   Historical Provider, MD  ibuprofen (ADVIL,MOTRIN) 200 MG tablet Take 600 mg by mouth every 6 (six) hours as needed for moderate pain.    Historical Provider, MD  lovastatin (MEVACOR) 40 MG tablet Take 40 mg by mouth at bedtime.     Historical Provider, MD  metFORMIN (GLUCOPHAGE) 500 MG tablet  02/12/16   Historical Provider, MD    metoprolol tartrate (LOPRESSOR) 25 MG tablet Take 25 mg by mouth daily.     Historical Provider, MD  naphazoline-glycerin (CLEAR EYES) 0.012-0.2 % SOLN Place 2 drops into both eyes daily as needed for irritation.    Historical Provider, MD  omeprazole (PRILOSEC) 20 MG capsule Take 1 capsule by mouth daily. 06/28/13   Historical Provider, MD  sulfamethoxazole-trimethoprim (BACTRIM DS,SEPTRA DS) 800-160 MG tablet  11/16/15   Historical Provider, MD  TRADJENTA 5 MG TABS tablet  02/12/16   Historical Provider, MD  TRUEPLUS LANCETS 33G [place]  02/15/16   Historical Provider, MD    Family History Family History  Problem Relation Age of Onset  . Breast cancer Sister   . Breast cancer Maternal Grandmother   . Colon cancer Daughter     Social History Social History  Substance Use Topics  . Smoking status: Current Every Day Smoker    Packs/day: 0.50    Types: Cigarettes  . Smokeless tobacco: Never Used  . Alcohol use No     Allergies   Pollen extract   Review of Systems Review of Systems  Constitutional: Negative for appetite change and fatigue.  HENT: Negative for congestion, ear discharge and sinus pressure.   Eyes: Negative for discharge.  Respiratory: Negative for cough.   Cardiovascular: Negative for chest pain.  Gastrointestinal: Negative for abdominal pain and diarrhea.  Genitourinary: Negative for frequency and hematuria.  Musculoskeletal: Negative for back pain.  Skin: Negative for rash.  Neurological: Positive for weakness. Negative for seizures, speech difficulty and headaches.  Psychiatric/Behavioral: Negative for hallucinations.     Physical Exam Updated Vital Signs BP 161/70 (BP Location: Left Arm)   Pulse 69   Temp 98.1 F (36.7 C) (Oral)   Resp 20   Ht 5' (1.524 m)   Wt 133 lb (60.3 kg)   SpO2 99%   BMI 25.97 kg/m   Physical Exam  Constitutional: She is oriented to person, place, and time. She appears well-developed.  HENT:  Head: Normocephalic.   Eyes: Conjunctivae and EOM are normal. No scleral icterus.  Neck: Neck supple. No thyromegaly present.  Cardiovascular: Normal rate and regular rhythm.  Exam reveals no gallop and no friction rub.   No murmur heard. Pulmonary/Chest: No stridor. She has no wheezes. She has no rales. She exhibits no tenderness.  Abdominal: She exhibits no distension. There is no tenderness. There is no rebound.  Musculoskeletal: She exhibits no edema.  Lymphadenopathy:    She has no cervical adenopathy.  Neurological: She is alert and oriented to person, place, and time. She exhibits normal muscle tone.  Profound weakness to LUE and LLE Mildly slurred speech  Skin: No rash noted. No erythema.  Psychiatric: She has a normal mood and affect. Her behavior is normal.  Nursing note and vitals reviewed.  ED Treatments / Results  DIAGNOSTIC STUDIES:  Oxygen Saturation is 99% on RA, normal by my interpretation.    COORDINATION OF CARE:  12:19 PM Discussed treatment plan with pt at bedside and pt agreed to plan.  Labs (all labs ordered are listed, but only abnormal results are displayed) Labs Reviewed  COMPREHENSIVE METABOLIC PANEL - Abnormal; Notable for the following:       Result Value   Sodium 134 (*)    Glucose, Bld 135 (*)    ALT 12 (*)    All other components within normal limits  CBG MONITORING, ED - Abnormal; Notable for the following:    Glucose-Capillary 128 (*)    All other components within normal limits  I-STAT CHEM 8, ED - Abnormal; Notable for the following:    Chloride 100 (*)    Glucose, Bld 135 (*)    All other components within normal limits  PROTIME-INR  APTT  CBC  DIFFERENTIAL  I-STAT TROPOININ, ED    EKG  EKG Interpretation None       Radiology No results found.  Procedures Procedures (including critical care time)  Medications Ordered in ED Medications - No data to display   Initial Impression / Assessment and Plan / ED Course  I have reviewed the  triage vital signs and the nursing notes.  Pertinent labs & imaging results that were available during my care of the patient were reviewed by me and considered in my medical decision making (see chart for details).  Clinical Course    MRI shows stroke on the right frontal area. She'll be admitted for further workup  Final Clinical Impressions(s) / ED Diagnoses   Final diagnoses:  Stroke [facility])    New Prescriptions New Prescriptions   No medications on file    The chart was scribed for me under my direct supervision.  I personally performed the history, physical, and medical decision making and all procedures in the evaluation of this patient.Milton Ferguson, MD 03/22/16 [phone]

## 2016-03-23 ENCOUNTER — Inpatient Hospital Stay (HOSPITAL_COMMUNITY): Payer: Commercial Managed Care - HMO

## 2016-03-23 DIAGNOSIS — I6350 Cerebral infarction due to unspecified occlusion or stenosis of unspecified cerebral artery: Secondary | ICD-10-CM

## 2016-03-23 LAB — LIPID PANEL
CHOL/HDL RATIO: 4.6 ratio
Cholesterol: 123 mg/dL (ref 0–200)
HDL: 27 mg/dL — AB (ref >40)
LDL CALC: 30 mg/dL (ref 0–99)
Triglycerides: 331 mg/dL — ABNORMAL HIGH (ref <150)
VLDL: 66 mg/dL — ABNORMAL HIGH (ref 0–40)

## 2016-03-23 LAB — ECHOCARDIOGRAM COMPLETE
AVLVOTPG: 3 mmHg
E decel time: 301 msec
E/e' ratio: 16.81
FS: 30 % (ref 28–44)
HEIGHTINCHES: 65 in
IVS/LV PW RATIO, ED: 1.08
LA diam end sys: 40 mm
LA diam index: 2.4 cm/m2
LA vol index: 22.7 mL/m2
LASIZE: 40 mm
LAVOL: 37.9 mL
LAVOLA4C: 32 mL
LV E/e' medial: 16.81
LV E/e'average: 16.81
LV TDI E'MEDIAL: 5.11
LV dias vol: 61 mL (ref 46–106)
LV e' LATERAL: 4.79 cm/s
LVDIAVOLIN: 37 mL/m2
LVOT SV: 73 mL
LVOT VTI: 23.2 cm
LVOT area: 3.14 cm2
LVOT peak vel: 92.4 cm/s
LVOTD: 20 mm
Lateral S' vel: 11.9 cm/s
MV Dec: 301
MV Peak grad: 3 mmHg
MV pk E vel: 80.5 m/s
MVPKAVEL: 139 m/s
PW: 13 mm — AB (ref 0.6–1.1)
RV TAPSE: 19.6 mm
RV sys press: 22 mmHg
Reg peak vel: 216 cm/s
TDI e' lateral: 4.79
TRMAXVEL: 216 cm/s
WEIGHTICAEL: 2128 [oz_av]

## 2016-03-23 LAB — GLUCOSE, CAPILLARY
GLUCOSE-CAPILLARY: 119 mg/dL — AB (ref 65–99)
GLUCOSE-CAPILLARY: 149 mg/dL — AB (ref 65–99)
Glucose-Capillary: 99 mg/dL (ref 65–99)
Glucose-Capillary: 121 mg/dL — ABNORMAL HIGH (ref 65–99)

## 2016-03-23 NOTE — Evaluation (Signed)
Physical Therapy Evaluation Patient Details Name: [name] MRN: [id number] DOB: Dec 18, 1940 Today's Date: 03/23/2016   History of Present Illness  75 y.o. female with medical history significant of HTN, HLD, DM and tobacco abuse. Patient reports that yesterday morning she work up with left sided numbness and heaviness in her left upper and lower extremity. She felt as though her left sided had "gone to sleep" and was waiting for symptoms to resolve. She did not seek medical care at that time. She went to bed and woke up this morning with worsening symptoms of left sided weakness, difficulty walking and dropping things in her left hand. She denies any acute changes in vision, difficulty swallowing or speaking. She went to her primary care physician and was directed to come to the hospital. Her MRI showed small acute white matter infarct in the right frontal lobe. No hemorrhage or mass effect. She has no Swallowing problem. Her speech not slurred.  PMH: breast CA s/p R mastectomy, L lung PE, HTN, tendonitis, appendectomy, DM2  Clinical Impression  Pt received in bed, and was agreeable to PT evaluation.  Pt expressed that she lives with her 67 yo disabled son.  Prior to admission she was independent with ambulation, ADL's, and IADL's.  During PT evaluation today she demonstrates significant L sided weakness UE weakness is > LE weakness.  She has significantly delayed initiation of movement and motor planning on the L side.  She was able to ambulate 150f with RW and Min guard.  Pt would benefit from HHPT vs OPPT, however she opted for HHPT due to difficulties with driving and her son does not drive.  She would also benefit from a RW.     Follow Up Recommendations Home health PT    Equipment Recommendations  Rolling walker with 5" wheels    Recommendations for Other Services       Precautions / Restrictions Precautions Precautions: Fall Precaution Comments: 1 fall when carrying a chest of drawers  with son, and a lava lamp slide down off the chest and hit her on the head, and she fell to her knees.  Restrictions Weight Bearing Restrictions: No      Mobility  Bed Mobility Overal bed mobility: Modified Independent                Transfers Overall transfer level: Needs assistance Equipment used: Rolling walker (2 wheeled) Transfers: Sit to/from SOmnicareSit to Stand: Min guard;Min assist (L hand placed on RW, and pt used R UE to push up from the bed. ) Stand pivot transfers: Min guard;Min assist          Ambulation/Gait Ambulation/Gait assistance: Min guard Ambulation Distance (Feet): 100 Feet Assistive device: Rolling walker (2 wheeled) Gait Pattern/deviations: Step-through pattern;Step-to pattern;Decreased dorsiflexion - left;Narrow base of support;Decreased step length - left   Gait velocity interpretation: <1.8 ft/sec, indicative of risk for recurrent falls General Gait Details: VC's to keep body inside base of the walker.  Pt demonstrates a very cautious and guarded gait as she expresses that she is afraid of falling.  she progressed from a step to pattern to step through.    Stairs            Wheelchair Mobility    Modified Rankin (Stroke Patients Only)       Balance Overall balance assessment: Needs assistance         Standing balance support: Bilateral upper extremity supported Standing balance-Leahy Scale: Fair  Pertinent Vitals/Pain Pain Assessment: No/denies pain    Home Living   Living Arrangements: Children (57 yo disabled son - does not need physical assistance. )   Type of Home: Mobile home Home Access: Stairs to enter Entrance Stairs-Rails: Left Entrance Stairs-Number of Steps: 4 steps on the front and the back.  Both rails on the back,  Home Layout: One level Home Equipment: Cane - single point;Wheelchair - Regulatory affairs officer / Transfers  Assistance Needed: independent  ADL's / Homemaking Assistance Needed: independent with ADL's, IADL's, and driving.         Hand Dominance   Dominant Hand: Right    Extremity/Trunk Assessment   Upper Extremity Assessment: RUE deficits/detail;LUE deficits/detail RUE Deficits / Details: WNL - pt reports that it is usually her weaker UE due to mastectomy.      LUE Deficits / Details: Pt demonstrates trace grip in the hand, but unable to extend and open hand, 2/5 for shoulder elevation, PROM is WNL, however painful at end range flexion, 2/5 for elbow flexion and extension.  Severely delayed initiation of movements.    Lower Extremity Assessment: LLE deficits/detail   LLE Deficits / Details: 2/5 for knee extension, 3/5 hip flexion, and 3/5 for ankle DF, however pt demonstrates significantly delayed initiation of all movements.      Communication   Communication: No difficulties  Cognition Arousal/Alertness: Awake/alert Behavior During Therapy: WFL for tasks assessed/performed Overall Cognitive Status: Within Functional Limits for tasks assessed                      General Comments      Exercises        Assessment/Plan    PT Assessment Patient needs continued PT services  PT Diagnosis Difficulty walking;Abnormality of gait;Generalized weakness;Hemiplegia non-dominant side   PT Problem List Decreased strength;Decreased range of motion;Decreased activity tolerance;Decreased balance;Decreased mobility;Decreased coordination;Decreased knowledge of use of DME;Decreased safety awareness;Decreased knowledge of precautions;Impaired tone  PT Treatment Interventions DME instruction;Gait training;Stair training;Functional mobility training;Therapeutic activities;Therapeutic exercise;Balance training;Neuromuscular re-education;Patient/family education   PT Goals (Current goals can be found in the Care Plan section) Acute Rehab PT Goals Patient Stated Goal: Pt wants to go home.   PT Goal Formulation: With patient Time For Goal Achievement: 03/30/16 Potential to Achieve Goals: Good    Frequency 7X/week   Barriers to discharge        Co-evaluation               End of Session Equipment Utilized During Treatment: Gait belt Activity Tolerance: Patient tolerated treatment well Patient left: in chair Nurse Communication: Mobility status    Functional Assessment Tool Used: [employer] "6-clicks"  Functional Limitation: Mobility: Walking and moving around Mobility: Walking and Moving Around Current Status [phone]): At least 20 percent but less than 40 percent impaired, limited or restricted Mobility: Walking and Moving Around Goal Status [phone]): At least 1 percent but less than 20 percent impaired, limited or restricted    Time: 0955-1035 PT Time Calculation (min) (ACUTE ONLY): 40 min   Charges:   PT Evaluation $PT Eval Low Complexity: 1 Procedure PT Treatments $Gait Training: 8-22 mins $Therapeutic Activity: 8-22 mins   PT G Codes:   PT G-Codes **NOT FOR INPATIENT CLASS** Functional Assessment Tool Used: [employer] "6-clicks"  Functional Limitation: Mobility: Walking and moving around Mobility: Walking and Moving Around Current Status [phone]): At least 20 percent but less than  40 percent impaired, limited or restricted Mobility: Walking and Moving Around Goal Status [phone]): At least 1 percent but less than 20 percent impaired, limited or restricted   Beth Tashima Scarpulla, PT, DPT X: [phone]

## 2016-03-23 NOTE — Progress Notes (Signed)
OT Cancellation Note  Patient Details Name: [name] MRN: [id number] DOB: 1940/12/17   Cancelled Treatment:     Reason evaluation not completed: Pt unavailable. Pt leaving for ultrasound this am. Will check back later.     Guadelupe Sabin, OTR/L  [phone] 03/23/2016, 8:45 AM

## 2016-03-23 NOTE — Consult Note (Signed)
   [facility] CM Inpatient Consult   03/23/2016  [name] 10/13/1940 [id number]   Spoke with patient at bedside regarding [facility] services. Patient reports she is usually very independent, lives with a disable son, patient thinks she will go home with home care. Patient does not wish to sign up with [facility] at this time. Patient states she has a sister who is a Marine scientist and will discuss program with her sister and can contact [facility] for future needs. Patient given [facility] brochure and contact information for future reference.  Of note, [facility] Care Management services would not replace or interfere with any services that are arranged by inpatient case management or social work. For additional questions or referrals please contact:  Royetta Crochet. Laymond Purser, RN, BSN, [facility] Liaison [phone]

## 2016-03-23 NOTE — Care Management Note (Signed)
Case Management Note  Patient Details  Name: [name] MRN: [id number] Date of Birth: 1940-10-07  Subjective/Objective: Patient is from home. Her disabled son lives with her,  But he is ind with ADL's. Patient still drives and has a PCP. She has insurance and reports no issues affording medications. PT has recommended HHPT/OT and a walker.                    Action/Plan: Offered choice. Romualdo Bolk of [facility] notified and will obtain orders from chart. Patient made aware [facility] has 48 hours to make first visit. Gilford Rile will be delivered to room today.    Expected Discharge Date:                  Expected Discharge Plan:  [place]  In-House Referral:  NA  Discharge planning Services  CM Consult  Post Acute Care Choice:  Durable Medical Equipment, Home Health Choice offered to:  Patient  DME Arranged:  Gilford Rile DME Agency:  [place] Arranged:  PT, OT [facility] Agency:  [place]  Status of Service:  Completed, signed off  If discussed at [place] of Stay Meetings, dates discussed:    Additional Comments:  Jessie Cowher, Chauncey Reading, RN 03/23/2016, 11:41 AM

## 2016-03-23 NOTE — Progress Notes (Signed)
Subjective: This is a 75 years old female with history of multiple medical illness was seen in ER due to left sided weakness and numbness. Her MRI showed small acute white matter infarct in the right frontal lobe. No hemorrhage or mass effect. She has no  Swallowing probroblem. Her speech not slurred.  Objective: Vital signs in last 24 hours: Temp:  [97.8 F (36.6 C)-98.2 F (36.8 C)] 97.8 F (36.6 C) (08/24 0537) Pulse Rate:  [67-113] 68 (08/24 0537) Resp:  [16-24] 20 (08/24 0537) BP: (112-161)/(46-78) 116/63 (08/24 0537) SpO2:  [93 %-100 %] 98 % (08/24 0537) Weight:  [60.3 kg (133 lb)] 60.3 kg (133 lb) (08/23 1205) Weight change:  Last BM Date: 03/22/16  Intake/Output from previous day: 08/23 0701 - 08/24 0700 In: 732.5 [P.O.:240; I.V.:492.5] Out: -   PHYSICAL EXAM General appearance: alert and no distress Resp: clear to auscultation bilaterally Cardio: S1, S2 normal GI: soft, non-tender; bowel sounds normal; no masses,  no organomegaly Extremities: left sided weakness, Power  LUE 2/5 andLLE 3/5  Lab Results:  Results for orders placed or performed during the hospital encounter of 03/22/16 (from the past 48 hour(s))  CBG monitoring, ED     Status: Abnormal   Collection Time: 03/22/16 12:21 PM  Result Value Ref Range   Glucose-Capillary 128 (H) 65 - 99 mg/dL  Protime-INR     Status: None   Collection Time: 03/22/16 12:27 PM  Result Value Ref Range   Prothrombin Time 12.5 11.4 - 15.2 seconds   INR 0.93   APTT     Status: None   Collection Time: 03/22/16 12:27 PM  Result Value Ref Range   aPTT 31 24 - 36 seconds  CBC     Status: None   Collection Time: 03/22/16 12:27 PM  Result Value Ref Range   WBC 7.3 4.0 - 10.5 K/uL   RBC 4.59 3.87 - 5.11 MIL/uL   Hemoglobin 14.1 12.0 - 15.0 g/dL   HCT 42.2 36.0 - 46.0 %   MCV 91.9 78.0 - 100.0 fL   MCH 30.7 26.0 - 34.0 pg   MCHC 33.4 30.0 - 36.0 g/dL   RDW 13.4 11.5 - 15.5 %   Platelets 162 150 - 400 K/uL  Differential      Status: None   Collection Time: 03/22/16 12:27 PM  Result Value Ref Range   Neutrophils Relative % 68 %   Neutro Abs 4.9 1.7 - 7.7 K/uL   Lymphocytes Relative 23 %   Lymphs Abs 1.7 0.7 - 4.0 K/uL   Monocytes Relative 7 %   Monocytes Absolute 0.5 0.1 - 1.0 K/uL   Eosinophils Relative 2 %   Eosinophils Absolute 0.2 0.0 - 0.7 K/uL   Basophils Relative 0 %   Basophils Absolute 0.0 0.0 - 0.1 K/uL  Comprehensive metabolic panel     Status: Abnormal   Collection Time: 03/22/16 12:27 PM  Result Value Ref Range   Sodium 134 (L) 135 - 145 mmol/L   Potassium 4.2 3.5 - 5.1 mmol/L   Chloride 102 101 - 111 mmol/L   CO2 25 22 - 32 mmol/L   Glucose, Bld 135 (H) 65 - 99 mg/dL   BUN 9 6 - 20 mg/dL   Creatinine, Ser 0.73 0.44 - 1.00 mg/dL   Calcium 9.2 8.9 - 10.3 mg/dL   Total Protein 7.8 6.5 - 8.1 g/dL   Albumin 4.2 3.5 - 5.0 g/dL   AST 22 15 - 41 U/L   ALT 12 (  L) 14 - 54 U/L   Alkaline Phosphatase 45 38 - 126 U/L   Total Bilirubin 0.7 0.3 - 1.2 mg/dL   GFR calc non Af Amer >60 >60 mL/min   GFR calc Af Amer >60 >60 mL/min    Comment: (NOTE) The eGFR has been calculated using the CKD EPI equation. This calculation has not been validated in all clinical situations. eGFR's persistently <60 mL/min signify possible Chronic Kidney Disease.    Anion gap 7 5 - 15  I-stat troponin, ED     Status: None   Collection Time: 03/22/16 12:32 PM  Result Value Ref Range   Troponin i, poc 0.00 0.00 - 0.08 ng/mL   Comment 3            Comment: Due to the release kinetics of cTnI, a negative result within the first hours of the onset of symptoms does not rule out myocardial infarction with certainty. If myocardial infarction is still suspected, repeat the test at appropriate intervals.   I-Stat Chem 8, ED     Status: Abnormal   Collection Time: 03/22/16 12:35 PM  Result Value Ref Range   Sodium 139 135 - 145 mmol/L   Potassium 4.3 3.5 - 5.1 mmol/L   Chloride 100 (L) 101 - 111 mmol/L   BUN 8 6 - 20  mg/dL   Creatinine, Ser 0.70 0.44 - 1.00 mg/dL   Glucose, Bld 135 (H) 65 - 99 mg/dL   Calcium, Ion 1.19 1.12 - 1.23 mmol/L   TCO2 25 0 - 100 mmol/L   Hemoglobin 14.6 12.0 - 15.0 g/dL   HCT 43.0 36.0 - 46.0 %  Glucose, capillary     Status: Abnormal   Collection Time: 03/22/16 10:17 PM  Result Value Ref Range   Glucose-Capillary 149 (H) 65 - 99 mg/dL   Comment 1 Notify RN    Comment 2 Document in Chart   Lipid panel     Status: Abnormal   Collection Time: 03/23/16  6:28 AM  Result Value Ref Range   Cholesterol 123 0 - 200 mg/dL   Triglycerides 331 (H) <150 mg/dL   HDL 27 (L) >40 mg/dL   Total CHOL/HDL Ratio 4.6 RATIO   VLDL 66 (H) 0 - 40 mg/dL   LDL Cholesterol 30 0 - 99 mg/dL    Comment:        Total Cholesterol/HDL:CHD Risk Coronary Heart Disease Risk Table                     Men   Women  1/2 Average Risk   3.4   3.3  Average Risk       5.0   4.4  2 X Average Risk   9.6   7.1  3 X Average Risk  23.4   11.0        Use the calculated Patient Ratio above and the CHD Risk Table to determine the patient's CHD Risk.        ATP III CLASSIFICATION (LDL):  <100     mg/dL   Optimal  100-129  mg/dL   Near or Above                    Optimal  130-159  mg/dL   Borderline  160-189  mg/dL   High  >190     mg/dL   Very High   Glucose, capillary     Status: None   Collection Time: 03/23/16  7:39 AM  Result Value Ref Range   Glucose-Capillary 99 65 - 99 mg/dL    ABGS  Recent Labs  03/22/16 1235  TCO2 25   CULTURES No results found for this or any previous visit (from the past 240 hour(s)). Studies/Results: Dg Chest 2 View  Result Date: 03/22/2016 CLINICAL DATA:  Left-sided weakness, numbness. Cough. Right breast cancer. EXAM: CHEST  2 VIEW COMPARISON:  08/02/2013 FINDINGS: Subpleural reticulation/ fibrosis in the lungs bilaterally. No focal consolidation. No pleural effusion or pneumothorax. The heart is normal in size. Degenerative changes of the visualized thoracolumbar  spine. IMPRESSION: No evidence of acute cardiopulmonary disease. Electronically Signed   By: Julian Hy M.D.   On: 03/22/2016 18:19   Mr Virgel Paling YS Contrast  Result Date: 03/22/2016 CLINICAL DATA:  75 year old female with 2 days of left side weakness. Initial encounter. EXAM: MRI HEAD WITHOUT CONTRAST MRA HEAD WITHOUT CONTRAST TECHNIQUE: Multiplanar, multiecho pulse sequences of the brain and surrounding structures were obtained without intravenous contrast. Angiographic images of the head were obtained using MRA technique without contrast. COMPARISON:  Head CT without contrast 01/11/2016. FINDINGS: MRI HEAD FINDINGS 17 mm focus of restricted diffusion in the right frontal lobe white matter extending from the right corona radiata toward the subcortical white matter. Associated T2 and FLAIR hyperintensity (series 7, image 17) without hemorrhage or mass effect. No other restricted diffusion. Major intracranial vascular flow voids appear normal. No midline shift, mass effect, evidence of mass lesion, ventriculomegaly, extra-axial collection or acute intracranial hemorrhage. Cervicomedullary junction and pituitary are within normal limits. Negative visualized cervical spine. Outside of the acute findings stated above, there is patchy nonspecific white matter T2 and FLAIR hyperintensity in both hemispheres. No cortical encephalomalacia or chronic cerebral blood products are identified. Bilateral deep gray matter nuclei, the brainstem, and cerebellum appear normal for age. Visible internal auditory structures appear normal. Visualized paranasal sinuses and mastoids are stable and well pneumatized. Postoperative changes to the right globe. Negative orbit and scalp soft tissues. MRA HEAD FINDINGS Antegrade flow in the posterior circulation. The distal left vertebral artery is dominant. Both PICA origins are patent. Patent vertebrobasilar junction and basilar artery. SCA origins appear patent. Normal right PCA  origin. Fetal origin of the left PCA. Right posterior communicating artery is diminutive or absent. Bilateral PCA branches appear normal. Antegrade flow in both ICA siphons. There is motion artifact at the level of the vertical petrous segments which is felt to explain signal loss on the right. Siphon irregularity otherwise compatible with atherosclerosis but no hemodynamically significant stenosis suspected. Ophthalmic and left posterior communicating artery origins are normal. Patent carotid termini. MCA and ACA origins are within normal limits. Anterior communicating artery is diminutive or absent. Visualized ACA branches are within normal limits. Right MCA M1 segment and visualized right MCA branches are within normal limits. No right MCA branch occlusion is identified. On the left apparent mild to moderate stenosis at the left MCA bifurcation may be artifact due to motion. Otherwise the visualized left MCA branches are within normal limits. IMPRESSION: 1. Small acute white matter infarct in the right frontal lobe with no hemorrhage or mass effect. 2. Mildly motion degraded MRA is negative for age. No emergent large vessel occlusion. 3. Mild to moderate for age other nonspecific cerebral white matter signal changes, most commonly due to chronic small vessel disease. Electronically Signed   By: Genevie Ann M.D.   On: 03/22/2016 13:52   Mr Brain Wo Contrast  Result Date: 03/22/2016 CLINICAL DATA:  75 year old  female with 2 days of left side weakness. Initial encounter. EXAM: MRI HEAD WITHOUT CONTRAST MRA HEAD WITHOUT CONTRAST TECHNIQUE: Multiplanar, multiecho pulse sequences of the brain and surrounding structures were obtained without intravenous contrast. Angiographic images of the head were obtained using MRA technique without contrast. COMPARISON:  Head CT without contrast 01/11/2016. FINDINGS: MRI HEAD FINDINGS 17 mm focus of restricted diffusion in the right frontal lobe white matter extending from the right  corona radiata toward the subcortical white matter. Associated T2 and FLAIR hyperintensity (series 7, image 17) without hemorrhage or mass effect. No other restricted diffusion. Major intracranial vascular flow voids appear normal. No midline shift, mass effect, evidence of mass lesion, ventriculomegaly, extra-axial collection or acute intracranial hemorrhage. Cervicomedullary junction and pituitary are within normal limits. Negative visualized cervical spine. Outside of the acute findings stated above, there is patchy nonspecific white matter T2 and FLAIR hyperintensity in both hemispheres. No cortical encephalomalacia or chronic cerebral blood products are identified. Bilateral deep gray matter nuclei, the brainstem, and cerebellum appear normal for age. Visible internal auditory structures appear normal. Visualized paranasal sinuses and mastoids are stable and well pneumatized. Postoperative changes to the right globe. Negative orbit and scalp soft tissues. MRA HEAD FINDINGS Antegrade flow in the posterior circulation. The distal left vertebral artery is dominant. Both PICA origins are patent. Patent vertebrobasilar junction and basilar artery. SCA origins appear patent. Normal right PCA origin. Fetal origin of the left PCA. Right posterior communicating artery is diminutive or absent. Bilateral PCA branches appear normal. Antegrade flow in both ICA siphons. There is motion artifact at the level of the vertical petrous segments which is felt to explain signal loss on the right. Siphon irregularity otherwise compatible with atherosclerosis but no hemodynamically significant stenosis suspected. Ophthalmic and left posterior communicating artery origins are normal. Patent carotid termini. MCA and ACA origins are within normal limits. Anterior communicating artery is diminutive or absent. Visualized ACA branches are within normal limits. Right MCA M1 segment and visualized right MCA branches are within normal limits.  No right MCA branch occlusion is identified. On the left apparent mild to moderate stenosis at the left MCA bifurcation may be artifact due to motion. Otherwise the visualized left MCA branches are within normal limits. IMPRESSION: 1. Small acute white matter infarct in the right frontal lobe with no hemorrhage or mass effect. 2. Mildly motion degraded MRA is negative for age. No emergent large vessel occlusion. 3. Mild to moderate for age other nonspecific cerebral white matter signal changes, most commonly due to chronic small vessel disease. Electronically Signed   By: Genevie Ann M.D.   On: 03/22/2016 13:52    Medications: I have reviewed the patient's current medications.  Assesment:  Active Problems:   Stroke (cerebrum) (HCC)   Tobacco use disorder   Diabetes mellitus type 2 in nonobese (HCC)   Benign essential HTN   Hyperlipidemia   Acute left-sided weakness    Plan:  Medications reviewed Continue current treatment Neurology consult Physical therapy Continue Asprin and statin    LOS: 1 day   Anikin Prosser 03/23/2016, 8:10 AM

## 2016-03-23 NOTE — Progress Notes (Signed)
*  PRELIMINARY RESULTS* Echocardiogram 2D Echocardiogram has been performed.  [name] 03/23/2016, 2:20 PM

## 2016-03-24 LAB — HEMOGLOBIN A1C
HEMOGLOBIN A1C: 5.8 % — AB (ref 4.8–5.6)
MEAN PLASMA GLUCOSE: 120 mg/dL

## 2016-03-24 LAB — GLUCOSE, CAPILLARY: Glucose-Capillary: 120 mg/dL — ABNORMAL HIGH (ref 65–99)

## 2016-03-24 MED ORDER — ASPIRIN 325 MG PO TABS: 325.0000 mg | ORAL_TABLET | Freq: Every day | ORAL | 3 refills | Status: DC

## 2016-03-24 NOTE — Progress Notes (Signed)
Pt discharged via wheelchair into the care of her daughter in stable condition.  Discharge instructions reviewed with pt/daughter.  Pt/daughter verbalized understanding.

## 2016-03-24 NOTE — Discharge Summary (Signed)
Physician Discharge Summary  Patient ID: [name] MRN: [id number] DOB/AGE: 10-17-1940 75 y.o. Primary Care Physician:Kadin Bera, MD Admit date: 03/22/2016 Discharge date: 03/24/2016    Discharge Diagnoses:   Active Problems:   Stroke (cerebrum) (HCC)   Tobacco use disorder   Diabetes mellitus type 2 in nonobese (HCC)   Benign essential HTN   Hyperlipidemia   Acute left-sided weakness     Medication List    STOP taking these medications   aspirin-sod bicarb-citric acid 325 MG Tbef tablet Commonly known as:  ALKA-SELTZER   celecoxib 100 MG capsule Commonly known as:  CELEBREX   ibuprofen 200 MG tablet Commonly known as:  ADVIL,MOTRIN     TAKE these medications   amLODipine 5 MG tablet Commonly known as:  NORVASC Take 5 mg by mouth daily.   aspirin 325 MG tablet Take 1 tablet (325 mg total) by mouth daily.   CALCIUM + D3 PO Take 1 capsule by mouth 2 (two) times daily.   fluticasone 50 MCG/ACT nasal spray Commonly known as:  FLONASE   gabapentin 300 MG capsule Commonly known as:  NEURONTIN Take 300 mg by mouth at bedtime.   lovastatin 40 MG tablet Commonly known as:  MEVACOR Take 40 mg by mouth at bedtime.   metFORMIN 500 MG tablet Commonly known as:  GLUCOPHAGE Take 500 mg by mouth at bedtime.   metoprolol tartrate 25 MG tablet Commonly known as:  LOPRESSOR Take 25 mg by mouth daily.   naphazoline-glycerin 0.012-0.2 % Soln Commonly known as:  CLEAR EYES Place 2 drops into both eyes daily as needed for irritation.   omeprazole 20 MG capsule Commonly known as:  PRILOSEC Take 1 capsule by mouth daily.   TRADJENTA 5 MG Tabs tablet Generic drug:  linagliptin Take 5 mg by mouth daily.       Discharged Condition: improved    Consults: neurology (pending)  Significant Diagnostic Studies: Dg Chest 2 View  Result Date: 03/22/2016 CLINICAL DATA:  Left-sided weakness, numbness. Cough. Right breast cancer. EXAM: CHEST  2 VIEW COMPARISON:   08/02/2013 FINDINGS: Subpleural reticulation/ fibrosis in the lungs bilaterally. No focal consolidation. No pleural effusion or pneumothorax. The heart is normal in size. Degenerative changes of the visualized thoracolumbar spine. IMPRESSION: No evidence of acute cardiopulmonary disease. Electronically Signed   By: Julian Hy M.D.   On: 03/22/2016 18:19   Mr Virgel Paling VO Contrast  Result Date: 03/22/2016 CLINICAL DATA:  75 year old female with 2 days of left side weakness. Initial encounter. EXAM: MRI HEAD WITHOUT CONTRAST MRA HEAD WITHOUT CONTRAST TECHNIQUE: Multiplanar, multiecho pulse sequences of the brain and surrounding structures were obtained without intravenous contrast. Angiographic images of the head were obtained using MRA technique without contrast. COMPARISON:  Head CT without contrast 01/11/2016. FINDINGS: MRI HEAD FINDINGS 17 mm focus of restricted diffusion in the right frontal lobe white matter extending from the right corona radiata toward the subcortical white matter. Associated T2 and FLAIR hyperintensity (series 7, image 17) without hemorrhage or mass effect. No other restricted diffusion. Major intracranial vascular flow voids appear normal. No midline shift, mass effect, evidence of mass lesion, ventriculomegaly, extra-axial collection or acute intracranial hemorrhage. Cervicomedullary junction and pituitary are within normal limits. Negative visualized cervical spine. Outside of the acute findings stated above, there is patchy nonspecific white matter T2 and FLAIR hyperintensity in both hemispheres. No cortical encephalomalacia or chronic cerebral blood products are identified. Bilateral deep gray matter nuclei, the brainstem, and cerebellum appear normal for age. Visible  internal auditory structures appear normal. Visualized paranasal sinuses and mastoids are stable and well pneumatized. Postoperative changes to the right globe. Negative orbit and scalp soft tissues. MRA HEAD  FINDINGS Antegrade flow in the posterior circulation. The distal left vertebral artery is dominant. Both PICA origins are patent. Patent vertebrobasilar junction and basilar artery. SCA origins appear patent. Normal right PCA origin. Fetal origin of the left PCA. Right posterior communicating artery is diminutive or absent. Bilateral PCA branches appear normal. Antegrade flow in both ICA siphons. There is motion artifact at the level of the vertical petrous segments which is felt to explain signal loss on the right. Siphon irregularity otherwise compatible with atherosclerosis but no hemodynamically significant stenosis suspected. Ophthalmic and left posterior communicating artery origins are normal. Patent carotid termini. MCA and ACA origins are within normal limits. Anterior communicating artery is diminutive or absent. Visualized ACA branches are within normal limits. Right MCA M1 segment and visualized right MCA branches are within normal limits. No right MCA branch occlusion is identified. On the left apparent mild to moderate stenosis at the left MCA bifurcation may be artifact due to motion. Otherwise the visualized left MCA branches are within normal limits. IMPRESSION: 1. Small acute white matter infarct in the right frontal lobe with no hemorrhage or mass effect. 2. Mildly motion degraded MRA is negative for age. No emergent large vessel occlusion. 3. Mild to moderate for age other nonspecific cerebral white matter signal changes, most commonly due to chronic small vessel disease. Electronically Signed   By: Genevie Ann M.D.   On: 03/22/2016 13:52   Mr Brain Wo Contrast  Result Date: 03/22/2016 CLINICAL DATA:  75 year old female with 2 days of left side weakness. Initial encounter. EXAM: MRI HEAD WITHOUT CONTRAST MRA HEAD WITHOUT CONTRAST TECHNIQUE: Multiplanar, multiecho pulse sequences of the brain and surrounding structures were obtained without intravenous contrast. Angiographic images of the head  were obtained using MRA technique without contrast. COMPARISON:  Head CT without contrast 01/11/2016. FINDINGS: MRI HEAD FINDINGS 17 mm focus of restricted diffusion in the right frontal lobe white matter extending from the right corona radiata toward the subcortical white matter. Associated T2 and FLAIR hyperintensity (series 7, image 17) without hemorrhage or mass effect. No other restricted diffusion. Major intracranial vascular flow voids appear normal. No midline shift, mass effect, evidence of mass lesion, ventriculomegaly, extra-axial collection or acute intracranial hemorrhage. Cervicomedullary junction and pituitary are within normal limits. Negative visualized cervical spine. Outside of the acute findings stated above, there is patchy nonspecific white matter T2 and FLAIR hyperintensity in both hemispheres. No cortical encephalomalacia or chronic cerebral blood products are identified. Bilateral deep gray matter nuclei, the brainstem, and cerebellum appear normal for age. Visible internal auditory structures appear normal. Visualized paranasal sinuses and mastoids are stable and well pneumatized. Postoperative changes to the right globe. Negative orbit and scalp soft tissues. MRA HEAD FINDINGS Antegrade flow in the posterior circulation. The distal left vertebral artery is dominant. Both PICA origins are patent. Patent vertebrobasilar junction and basilar artery. SCA origins appear patent. Normal right PCA origin. Fetal origin of the left PCA. Right posterior communicating artery is diminutive or absent. Bilateral PCA branches appear normal. Antegrade flow in both ICA siphons. There is motion artifact at the level of the vertical petrous segments which is felt to explain signal loss on the right. Siphon irregularity otherwise compatible with atherosclerosis but no hemodynamically significant stenosis suspected. Ophthalmic and left posterior communicating artery origins are normal. Patent carotid termini.  MCA and ACA origins are within normal limits. Anterior communicating artery is diminutive or absent. Visualized ACA branches are within normal limits. Right MCA M1 segment and visualized right MCA branches are within normal limits. No right MCA branch occlusion is identified. On the left apparent mild to moderate stenosis at the left MCA bifurcation may be artifact due to motion. Otherwise the visualized left MCA branches are within normal limits. IMPRESSION: 1. Small acute white matter infarct in the right frontal lobe with no hemorrhage or mass effect. 2. Mildly motion degraded MRA is negative for age. No emergent large vessel occlusion. 3. Mild to moderate for age other nonspecific cerebral white matter signal changes, most commonly due to chronic small vessel disease. Electronically Signed   By: Genevie Ann M.D.   On: 03/22/2016 13:52   US Carotid Bilateral (at [facility] Only)  Result Date: 03/23/2016 CLINICAL DATA:  75 year old female with a history of left-sided weakness and dysphagia EXAM: BILATERAL CAROTID DUPLEX ULTRASOUND TECHNIQUE: Pearline Cables scale imaging, color Doppler and duplex ultrasound were performed of bilateral carotid and vertebral arteries in the neck. COMPARISON:  MRI 03/22/2016 FINDINGS: Criteria: Quantification of carotid stenosis is based on velocity parameters that correlate the residual internal carotid diameter with NASCET-based stenosis levels, using the diameter of the distal internal carotid lumen as the denominator for stenosis measurement. The following velocity measurements were obtained: RIGHT ICA:  Systolic 497 cm/sec, Diastolic 31 cm/sec CCA:  86 cm/sec SYSTOLIC ICA/CCA RATIO:  1.2 ECA:  69 cm/sec LEFT ICA:  Systolic 026 cm/sec, Diastolic 39 cm/sec CCA:  84 cm/sec SYSTOLIC ICA/CCA RATIO:  2.2 ECA:  95 cm/sec Right Brachial SBP: Not acquired Left Brachial SBP: Not acquired RIGHT CAROTID ARTERY: Atherosclerotic changes of the common carotid artery without significant calcifications.  Intermediate waveform. Heterogeneous and calcified plaque at the right carotid bifurcation. Anterior wall shadowing in the proximal internal carotid artery. Maximum velocity measured at a site distal to the calcifications. Significant tortuosity of the vasculature.  Low resistance ICA. RIGHT VERTEBRAL ARTERY: Antegrade flow with low resistance waveform. LEFT CAROTID ARTERY: Heterogeneous plaque of the left common carotid artery. Intermediate waveform maintained. Heterogeneous and partially calcified plaque at the carotid bifurcation. No significant anterior wall shadowing. Significant tortuosity of the left carotid artery. Low resistance ICA. LEFT VERTEBRAL ARTERY:  Antegrade flow with low resistance waveform. IMPRESSION: Right: Although the duplex study demonstrates no evidence of hemodynamically significant stenosis by established criteria, the flow velocities of the right ICA were obtained from an area distal to the maximum narrowing due to the presence of anterior wall plaque with shadowing and may be underestimating the percentage of ICA stenosis. If there is concern for a more precise assessment of right carotid stenosis, a dedicated cerebral angiogram may be considered, or alternatively as a second best test, CTA. Left: Heterogeneous and partially calcified plaque at the left carotid bifurcation contributes to 50% - 69% stenosis by established duplex criteria. Signed, Dulcy Fanny. Earleen Newport, DO [facility] Electronically Signed   By: Corrie Mckusick D.O.   On: 03/23/2016 10:12    Lab Results: Basic Metabolic Panel:  Recent Labs  03/22/16 1227 03/22/16 1235  NA 134* 139  K 4.2 4.3  CL 102 100*  CO2 25  --   GLUCOSE 135* 135*  BUN 9 8  CREATININE 0.73 0.70  CALCIUM 9.2  --    Liver Function Tests:  Recent Labs  03/22/16 1227  AST 22  ALT 12*  ALKPHOS 45  BILITOT 0.7  PROT 7.8  ALBUMIN 4.2     CBC:  Recent Labs  03/22/16 1227  03/22/16 1235  WBC 7.3  --   NEUTROABS 4.9  --   HGB 14.1 14.6  HCT 42.2 43.0  MCV 91.9  --   PLT 162  --     No results found for this or any previous visit (from the past 240 hour(s)).   Hospital Course:   This is a 75 years old female with history of multiple medical illnesses was admitted due to left side weakness and numbness. Her MRI showed small acute white matter infarct in the right frontal lobe. Patient was evaluated by physical therapy. She is able to walk with walker. Patient is started on ASA and her statin continued. Her blood pressure and her diabetes are well controlled. Patient will be discharged home  And out patient physical therapy will continue. Patient is strongly advised to stop tobacco smoking.  Discharge Exam: Blood pressure 114/63, pulse 79, temperature 98.2 F (36.8 C), temperature source Oral, resp. rate 20, height '5\' 5"'$  (1.651 m), weight 60.3 kg (133 lb), SpO2 96 %.    Disposition:  home    Follow-up Information    Jeanann Balinski, MD Follow up in 2 week(s).   Specialty:  Internal Medicine Contact information: [place] [address] [phone]           Signed: Rosita Fire   03/24/2016, 8:28 AM

## 2016-03-24 NOTE — Evaluation (Signed)
Occupational Therapy Evaluation Patient Details Name: [name] MRN: [id number] DOB: Jun 04, 1941 Today's Date: 03/24/2016    History of Present Illness 75 y.o. female with medical history significant of HTN, HLD, DM and tobacco abuse. Patient reports that yesterday morning she work up with left sided numbness and heaviness in her left upper and lower extremity. She felt as though her left sided had "gone to sleep" and was waiting for symptoms to resolve. She did not seek medical care at that time. She went to bed and woke up this morning with worsening symptoms of left sided weakness, difficulty walking and dropping things in her left hand. She denies any acute changes in vision, difficulty swallowing or speaking. She went to her primary care physician and was directed to come to the hospital. Her MRI showed small acute white matter infarct in the right frontal lobe. No hemorrhage or mass effect. She has no Swallowing problem. Her speech not slurred.  PMH: breast CA s/p R mastectomy, L lung PE, HTN, tendonitis, appendectomy, DM2   Clinical Impression   Pt awake, alert, oriented x4 this am, reports she is feeling much improved. Pt LUE deficits has improved this am compared to previous notes. Pt continues to have weakness and decreased coordination in LUE and requires increased time for functional task completion. Recommend HHOT on discharge to work on improving strength, activity tolerance, coordination, and independence and safety during functional task completion.     Follow Up Recommendations  Home health OT    Equipment Recommendations  None recommended by OT       Precautions / Restrictions Precautions Precautions: Fall Restrictions Weight Bearing Restrictions: No      Mobility Bed Mobility Overal bed mobility: Modified Independent                Transfers Overall transfer level: Modified independent   Transfers: Sit to/from Stand Sit to Stand: Modified independent  (Device/Increase time)                   ADL Overall ADL's : Needs assistance/impaired Eating/Feeding: Set up;Bed level   Grooming: Wash/dry hands;Oral care;Supervision/safety;Standing               Lower Body Dressing: Modified independent;Sitting/lateral leans Lower Body Dressing Details (indicate cue type and reason): Pt requires increased time due to coordination deficits             Functional mobility during ADLs: Supervision/safety;Rolling walker       Vision Vision Assessment?: No apparent visual deficits          Pertinent Vitals/Pain Pain Assessment: No/denies pain     Hand Dominance Right   Extremity/Trunk Assessment Upper Extremity Assessment Upper Extremity Assessment: RUE deficits/detail;LUE deficits/detail RUE Deficits / Details: WNL - pt reports that it is usually her weaker UE due to mastectomy.  LUE Deficits / Details: This session pt demonstrates improved grip strength in LUE. Is able to extend all digits, composite flexion at 75%. Shoulder strength 3+/5, elbow 3/5, wrist 2+/5. Initiation of movements is only slightly delayed.  LUE Coordination: decreased gross motor;decreased fine motor   Lower Extremity Assessment Lower Extremity Assessment: Defer to PT evaluation       Communication Communication Communication: No difficulties   Cognition Arousal/Alertness: Awake/alert Behavior During Therapy: WFL for tasks assessed/performed Overall Cognitive Status: Within Functional Limits for tasks assessed  Home Living Family/patient expects to be discharged to:: Private residence Living Arrangements: Children (son (who is disabled)) Available Help at Discharge: Family;Available PRN/intermittently               Bathroom Shower/Tub: Walk-in shower   Bathroom Toilet: Handicapped height     Home Equipment: [place] - single point;Wheelchair - manual          Prior  Functioning/Environment Level of Independence: Independent  Gait / Transfers Assistance Needed: independent ADL's / Homemaking Assistance Needed: independent with ADL's, IADL's, and driving.         OT Diagnosis:  (hemiparesis non-dominant side)   OT Problem List: Decreased strength;Decreased activity tolerance;Impaired balance (sitting and/or standing);Decreased safety awareness;Decreased knowledge of use of DME or AE;Impaired UE functional use    End of Session Equipment Utilized During Treatment: Gait belt;Rolling walker  Activity Tolerance: Patient tolerated treatment well Patient left: in chair;with call bell/phone within reach;with nursing/sitter in room   Time: 0835-0900 OT Time Calculation (min): 25 min Charges:  OT General Charges $OT Visit: 1 Procedure OT Evaluation $OT Eval Low Complexity: 1 Procedure Guadelupe Sabin, OTR/L  [phone] 03/24/2016, 9:25 AM

## 2016-03-25 DIAGNOSIS — Z9011 Acquired absence of right breast and nipple: Secondary | ICD-10-CM | POA: Diagnosis not present

## 2016-03-25 DIAGNOSIS — I69354 Hemiplegia and hemiparesis following cerebral infarction affecting left non-dominant side: Secondary | ICD-10-CM | POA: Diagnosis not present

## 2016-03-25 DIAGNOSIS — F17219 Nicotine dependence, cigarettes, with unspecified nicotine-induced disorders: Secondary | ICD-10-CM | POA: Diagnosis not present

## 2016-03-25 DIAGNOSIS — Z5181 Encounter for therapeutic drug level monitoring: Secondary | ICD-10-CM | POA: Diagnosis not present

## 2016-03-25 DIAGNOSIS — Z853 Personal history of malignant neoplasm of breast: Secondary | ICD-10-CM | POA: Diagnosis not present

## 2016-03-25 DIAGNOSIS — I10 Essential (primary) hypertension: Secondary | ICD-10-CM | POA: Diagnosis not present

## 2016-03-25 DIAGNOSIS — E785 Hyperlipidemia, unspecified: Secondary | ICD-10-CM | POA: Diagnosis not present

## 2016-03-25 DIAGNOSIS — Z7984 Long term (current) use of oral hypoglycemic drugs: Secondary | ICD-10-CM | POA: Diagnosis not present

## 2016-03-25 DIAGNOSIS — E119 Type 2 diabetes mellitus without complications: Secondary | ICD-10-CM | POA: Diagnosis not present

## 2016-03-26 DIAGNOSIS — Z5181 Encounter for therapeutic drug level monitoring: Secondary | ICD-10-CM | POA: Diagnosis not present

## 2016-03-26 DIAGNOSIS — Z853 Personal history of malignant neoplasm of breast: Secondary | ICD-10-CM | POA: Diagnosis not present

## 2016-03-26 DIAGNOSIS — E785 Hyperlipidemia, unspecified: Secondary | ICD-10-CM | POA: Diagnosis not present

## 2016-03-26 DIAGNOSIS — Z7984 Long term (current) use of oral hypoglycemic drugs: Secondary | ICD-10-CM | POA: Diagnosis not present

## 2016-03-26 DIAGNOSIS — I10 Essential (primary) hypertension: Secondary | ICD-10-CM | POA: Diagnosis not present

## 2016-03-26 DIAGNOSIS — E119 Type 2 diabetes mellitus without complications: Secondary | ICD-10-CM | POA: Diagnosis not present

## 2016-03-26 DIAGNOSIS — Z9011 Acquired absence of right breast and nipple: Secondary | ICD-10-CM | POA: Diagnosis not present

## 2016-03-26 DIAGNOSIS — I69354 Hemiplegia and hemiparesis following cerebral infarction affecting left non-dominant side: Secondary | ICD-10-CM | POA: Diagnosis not present

## 2016-03-26 DIAGNOSIS — F17219 Nicotine dependence, cigarettes, with unspecified nicotine-induced disorders: Secondary | ICD-10-CM | POA: Diagnosis not present

## 2016-03-27 DIAGNOSIS — Z853 Personal history of malignant neoplasm of breast: Secondary | ICD-10-CM | POA: Diagnosis not present

## 2016-03-27 DIAGNOSIS — F17219 Nicotine dependence, cigarettes, with unspecified nicotine-induced disorders: Secondary | ICD-10-CM | POA: Diagnosis not present

## 2016-03-27 DIAGNOSIS — E119 Type 2 diabetes mellitus without complications: Secondary | ICD-10-CM | POA: Diagnosis not present

## 2016-03-27 DIAGNOSIS — Z5181 Encounter for therapeutic drug level monitoring: Secondary | ICD-10-CM | POA: Diagnosis not present

## 2016-03-27 DIAGNOSIS — E785 Hyperlipidemia, unspecified: Secondary | ICD-10-CM | POA: Diagnosis not present

## 2016-03-27 DIAGNOSIS — Z9011 Acquired absence of right breast and nipple: Secondary | ICD-10-CM | POA: Diagnosis not present

## 2016-03-27 DIAGNOSIS — I69354 Hemiplegia and hemiparesis following cerebral infarction affecting left non-dominant side: Secondary | ICD-10-CM | POA: Diagnosis not present

## 2016-03-27 DIAGNOSIS — I10 Essential (primary) hypertension: Secondary | ICD-10-CM | POA: Diagnosis not present

## 2016-03-27 DIAGNOSIS — Z7984 Long term (current) use of oral hypoglycemic drugs: Secondary | ICD-10-CM | POA: Diagnosis not present

## 2016-03-28 DIAGNOSIS — E119 Type 2 diabetes mellitus without complications: Secondary | ICD-10-CM | POA: Diagnosis not present

## 2016-03-28 DIAGNOSIS — Z9011 Acquired absence of right breast and nipple: Secondary | ICD-10-CM | POA: Diagnosis not present

## 2016-03-28 DIAGNOSIS — Z7984 Long term (current) use of oral hypoglycemic drugs: Secondary | ICD-10-CM | POA: Diagnosis not present

## 2016-03-28 DIAGNOSIS — I10 Essential (primary) hypertension: Secondary | ICD-10-CM | POA: Diagnosis not present

## 2016-03-28 DIAGNOSIS — Z5181 Encounter for therapeutic drug level monitoring: Secondary | ICD-10-CM | POA: Diagnosis not present

## 2016-03-28 DIAGNOSIS — Z853 Personal history of malignant neoplasm of breast: Secondary | ICD-10-CM | POA: Diagnosis not present

## 2016-03-28 DIAGNOSIS — F17219 Nicotine dependence, cigarettes, with unspecified nicotine-induced disorders: Secondary | ICD-10-CM | POA: Diagnosis not present

## 2016-03-28 DIAGNOSIS — E785 Hyperlipidemia, unspecified: Secondary | ICD-10-CM | POA: Diagnosis not present

## 2016-03-28 DIAGNOSIS — I69354 Hemiplegia and hemiparesis following cerebral infarction affecting left non-dominant side: Secondary | ICD-10-CM | POA: Diagnosis not present

## 2016-03-29 DIAGNOSIS — Z9011 Acquired absence of right breast and nipple: Secondary | ICD-10-CM | POA: Diagnosis not present

## 2016-03-29 DIAGNOSIS — Z5181 Encounter for therapeutic drug level monitoring: Secondary | ICD-10-CM | POA: Diagnosis not present

## 2016-03-29 DIAGNOSIS — F17219 Nicotine dependence, cigarettes, with unspecified nicotine-induced disorders: Secondary | ICD-10-CM | POA: Diagnosis not present

## 2016-03-29 DIAGNOSIS — E785 Hyperlipidemia, unspecified: Secondary | ICD-10-CM | POA: Diagnosis not present

## 2016-03-29 DIAGNOSIS — E119 Type 2 diabetes mellitus without complications: Secondary | ICD-10-CM | POA: Diagnosis not present

## 2016-03-29 DIAGNOSIS — I10 Essential (primary) hypertension: Secondary | ICD-10-CM | POA: Diagnosis not present

## 2016-03-29 DIAGNOSIS — I69354 Hemiplegia and hemiparesis following cerebral infarction affecting left non-dominant side: Secondary | ICD-10-CM | POA: Diagnosis not present

## 2016-03-29 DIAGNOSIS — Z853 Personal history of malignant neoplasm of breast: Secondary | ICD-10-CM | POA: Diagnosis not present

## 2016-03-29 DIAGNOSIS — Z7984 Long term (current) use of oral hypoglycemic drugs: Secondary | ICD-10-CM | POA: Diagnosis not present

## 2016-03-30 DIAGNOSIS — E785 Hyperlipidemia, unspecified: Secondary | ICD-10-CM | POA: Diagnosis not present

## 2016-03-30 DIAGNOSIS — Z5181 Encounter for therapeutic drug level monitoring: Secondary | ICD-10-CM | POA: Diagnosis not present

## 2016-03-30 DIAGNOSIS — E119 Type 2 diabetes mellitus without complications: Secondary | ICD-10-CM | POA: Diagnosis not present

## 2016-03-30 DIAGNOSIS — Z9011 Acquired absence of right breast and nipple: Secondary | ICD-10-CM | POA: Diagnosis not present

## 2016-03-30 DIAGNOSIS — I10 Essential (primary) hypertension: Secondary | ICD-10-CM | POA: Diagnosis not present

## 2016-03-30 DIAGNOSIS — F17219 Nicotine dependence, cigarettes, with unspecified nicotine-induced disorders: Secondary | ICD-10-CM | POA: Diagnosis not present

## 2016-03-30 DIAGNOSIS — I69354 Hemiplegia and hemiparesis following cerebral infarction affecting left non-dominant side: Secondary | ICD-10-CM | POA: Diagnosis not present

## 2016-03-30 DIAGNOSIS — Z853 Personal history of malignant neoplasm of breast: Secondary | ICD-10-CM | POA: Diagnosis not present

## 2016-03-30 DIAGNOSIS — Z7984 Long term (current) use of oral hypoglycemic drugs: Secondary | ICD-10-CM | POA: Diagnosis not present

## 2016-04-03 DIAGNOSIS — Z7984 Long term (current) use of oral hypoglycemic drugs: Secondary | ICD-10-CM | POA: Diagnosis not present

## 2016-04-03 DIAGNOSIS — E119 Type 2 diabetes mellitus without complications: Secondary | ICD-10-CM | POA: Diagnosis not present

## 2016-04-03 DIAGNOSIS — Z5181 Encounter for therapeutic drug level monitoring: Secondary | ICD-10-CM | POA: Diagnosis not present

## 2016-04-03 DIAGNOSIS — F17219 Nicotine dependence, cigarettes, with unspecified nicotine-induced disorders: Secondary | ICD-10-CM | POA: Diagnosis not present

## 2016-04-03 DIAGNOSIS — Z9011 Acquired absence of right breast and nipple: Secondary | ICD-10-CM | POA: Diagnosis not present

## 2016-04-03 DIAGNOSIS — Z853 Personal history of malignant neoplasm of breast: Secondary | ICD-10-CM | POA: Diagnosis not present

## 2016-04-03 DIAGNOSIS — I10 Essential (primary) hypertension: Secondary | ICD-10-CM | POA: Diagnosis not present

## 2016-04-03 DIAGNOSIS — I69354 Hemiplegia and hemiparesis following cerebral infarction affecting left non-dominant side: Secondary | ICD-10-CM | POA: Diagnosis not present

## 2016-04-03 DIAGNOSIS — E785 Hyperlipidemia, unspecified: Secondary | ICD-10-CM | POA: Diagnosis not present

## 2016-04-04 DIAGNOSIS — E785 Hyperlipidemia, unspecified: Secondary | ICD-10-CM | POA: Diagnosis not present

## 2016-04-04 DIAGNOSIS — I69354 Hemiplegia and hemiparesis following cerebral infarction affecting left non-dominant side: Secondary | ICD-10-CM | POA: Diagnosis not present

## 2016-04-04 DIAGNOSIS — I10 Essential (primary) hypertension: Secondary | ICD-10-CM | POA: Diagnosis not present

## 2016-04-04 DIAGNOSIS — Z853 Personal history of malignant neoplasm of breast: Secondary | ICD-10-CM | POA: Diagnosis not present

## 2016-04-04 DIAGNOSIS — F17219 Nicotine dependence, cigarettes, with unspecified nicotine-induced disorders: Secondary | ICD-10-CM | POA: Diagnosis not present

## 2016-04-04 DIAGNOSIS — Z5181 Encounter for therapeutic drug level monitoring: Secondary | ICD-10-CM | POA: Diagnosis not present

## 2016-04-04 DIAGNOSIS — Z7984 Long term (current) use of oral hypoglycemic drugs: Secondary | ICD-10-CM | POA: Diagnosis not present

## 2016-04-04 DIAGNOSIS — E119 Type 2 diabetes mellitus without complications: Secondary | ICD-10-CM | POA: Diagnosis not present

## 2016-04-04 DIAGNOSIS — Z9011 Acquired absence of right breast and nipple: Secondary | ICD-10-CM | POA: Diagnosis not present

## 2016-04-05 DIAGNOSIS — I10 Essential (primary) hypertension: Secondary | ICD-10-CM | POA: Diagnosis not present

## 2016-04-05 DIAGNOSIS — Z7984 Long term (current) use of oral hypoglycemic drugs: Secondary | ICD-10-CM | POA: Diagnosis not present

## 2016-04-05 DIAGNOSIS — E119 Type 2 diabetes mellitus without complications: Secondary | ICD-10-CM | POA: Diagnosis not present

## 2016-04-05 DIAGNOSIS — Z9011 Acquired absence of right breast and nipple: Secondary | ICD-10-CM | POA: Diagnosis not present

## 2016-04-05 DIAGNOSIS — I69354 Hemiplegia and hemiparesis following cerebral infarction affecting left non-dominant side: Secondary | ICD-10-CM | POA: Diagnosis not present

## 2016-04-05 DIAGNOSIS — E785 Hyperlipidemia, unspecified: Secondary | ICD-10-CM | POA: Diagnosis not present

## 2016-04-05 DIAGNOSIS — Z5181 Encounter for therapeutic drug level monitoring: Secondary | ICD-10-CM | POA: Diagnosis not present

## 2016-04-05 DIAGNOSIS — F17219 Nicotine dependence, cigarettes, with unspecified nicotine-induced disorders: Secondary | ICD-10-CM | POA: Diagnosis not present

## 2016-04-05 DIAGNOSIS — Z853 Personal history of malignant neoplasm of breast: Secondary | ICD-10-CM | POA: Diagnosis not present

## 2016-04-06 DIAGNOSIS — I69354 Hemiplegia and hemiparesis following cerebral infarction affecting left non-dominant side: Secondary | ICD-10-CM | POA: Diagnosis not present

## 2016-04-06 DIAGNOSIS — Z7984 Long term (current) use of oral hypoglycemic drugs: Secondary | ICD-10-CM | POA: Diagnosis not present

## 2016-04-06 DIAGNOSIS — F17219 Nicotine dependence, cigarettes, with unspecified nicotine-induced disorders: Secondary | ICD-10-CM | POA: Diagnosis not present

## 2016-04-06 DIAGNOSIS — E119 Type 2 diabetes mellitus without complications: Secondary | ICD-10-CM | POA: Diagnosis not present

## 2016-04-06 DIAGNOSIS — Z9011 Acquired absence of right breast and nipple: Secondary | ICD-10-CM | POA: Diagnosis not present

## 2016-04-06 DIAGNOSIS — I10 Essential (primary) hypertension: Secondary | ICD-10-CM | POA: Diagnosis not present

## 2016-04-06 DIAGNOSIS — Z5181 Encounter for therapeutic drug level monitoring: Secondary | ICD-10-CM | POA: Diagnosis not present

## 2016-04-06 DIAGNOSIS — E785 Hyperlipidemia, unspecified: Secondary | ICD-10-CM | POA: Diagnosis not present

## 2016-04-06 DIAGNOSIS — Z853 Personal history of malignant neoplasm of breast: Secondary | ICD-10-CM | POA: Diagnosis not present

## 2016-04-07 DIAGNOSIS — E119 Type 2 diabetes mellitus without complications: Secondary | ICD-10-CM | POA: Diagnosis not present

## 2016-04-07 DIAGNOSIS — G8190 Hemiplegia, unspecified affecting unspecified side: Secondary | ICD-10-CM | POA: Diagnosis not present

## 2016-04-07 DIAGNOSIS — F17200 Nicotine dependence, unspecified, uncomplicated: Secondary | ICD-10-CM | POA: Diagnosis not present

## 2016-04-07 DIAGNOSIS — G8192 Hemiplegia, unspecified affecting left dominant side: Secondary | ICD-10-CM | POA: Diagnosis not present

## 2016-04-07 DIAGNOSIS — I10 Essential (primary) hypertension: Secondary | ICD-10-CM | POA: Diagnosis not present

## 2016-04-07 DIAGNOSIS — Z23 Encounter for immunization: Secondary | ICD-10-CM | POA: Diagnosis not present

## 2016-04-10 DIAGNOSIS — I69354 Hemiplegia and hemiparesis following cerebral infarction affecting left non-dominant side: Secondary | ICD-10-CM | POA: Diagnosis not present

## 2016-04-10 DIAGNOSIS — Z853 Personal history of malignant neoplasm of breast: Secondary | ICD-10-CM | POA: Diagnosis not present

## 2016-04-10 DIAGNOSIS — E785 Hyperlipidemia, unspecified: Secondary | ICD-10-CM | POA: Diagnosis not present

## 2016-04-10 DIAGNOSIS — Z9011 Acquired absence of right breast and nipple: Secondary | ICD-10-CM | POA: Diagnosis not present

## 2016-04-10 DIAGNOSIS — I10 Essential (primary) hypertension: Secondary | ICD-10-CM | POA: Diagnosis not present

## 2016-04-10 DIAGNOSIS — F17219 Nicotine dependence, cigarettes, with unspecified nicotine-induced disorders: Secondary | ICD-10-CM | POA: Diagnosis not present

## 2016-04-10 DIAGNOSIS — E119 Type 2 diabetes mellitus without complications: Secondary | ICD-10-CM | POA: Diagnosis not present

## 2016-04-10 DIAGNOSIS — Z7984 Long term (current) use of oral hypoglycemic drugs: Secondary | ICD-10-CM | POA: Diagnosis not present

## 2016-04-10 DIAGNOSIS — Z5181 Encounter for therapeutic drug level monitoring: Secondary | ICD-10-CM | POA: Diagnosis not present

## 2016-04-11 DIAGNOSIS — E785 Hyperlipidemia, unspecified: Secondary | ICD-10-CM | POA: Diagnosis not present

## 2016-04-11 DIAGNOSIS — Z7984 Long term (current) use of oral hypoglycemic drugs: Secondary | ICD-10-CM | POA: Diagnosis not present

## 2016-04-11 DIAGNOSIS — F17219 Nicotine dependence, cigarettes, with unspecified nicotine-induced disorders: Secondary | ICD-10-CM | POA: Diagnosis not present

## 2016-04-11 DIAGNOSIS — E119 Type 2 diabetes mellitus without complications: Secondary | ICD-10-CM | POA: Diagnosis not present

## 2016-04-11 DIAGNOSIS — I69354 Hemiplegia and hemiparesis following cerebral infarction affecting left non-dominant side: Secondary | ICD-10-CM | POA: Diagnosis not present

## 2016-04-11 DIAGNOSIS — Z853 Personal history of malignant neoplasm of breast: Secondary | ICD-10-CM | POA: Diagnosis not present

## 2016-04-11 DIAGNOSIS — I10 Essential (primary) hypertension: Secondary | ICD-10-CM | POA: Diagnosis not present

## 2016-04-11 DIAGNOSIS — Z5181 Encounter for therapeutic drug level monitoring: Secondary | ICD-10-CM | POA: Diagnosis not present

## 2016-04-11 DIAGNOSIS — Z9011 Acquired absence of right breast and nipple: Secondary | ICD-10-CM | POA: Diagnosis not present

## 2016-04-14 DIAGNOSIS — I69354 Hemiplegia and hemiparesis following cerebral infarction affecting left non-dominant side: Secondary | ICD-10-CM | POA: Diagnosis not present

## 2016-04-17 DIAGNOSIS — I69354 Hemiplegia and hemiparesis following cerebral infarction affecting left non-dominant side: Secondary | ICD-10-CM | POA: Diagnosis not present

## 2016-04-17 DIAGNOSIS — E785 Hyperlipidemia, unspecified: Secondary | ICD-10-CM | POA: Diagnosis not present

## 2016-04-17 DIAGNOSIS — Z9011 Acquired absence of right breast and nipple: Secondary | ICD-10-CM | POA: Diagnosis not present

## 2016-04-17 DIAGNOSIS — F17219 Nicotine dependence, cigarettes, with unspecified nicotine-induced disorders: Secondary | ICD-10-CM | POA: Diagnosis not present

## 2016-04-17 DIAGNOSIS — Z7984 Long term (current) use of oral hypoglycemic drugs: Secondary | ICD-10-CM | POA: Diagnosis not present

## 2016-04-17 DIAGNOSIS — E119 Type 2 diabetes mellitus without complications: Secondary | ICD-10-CM | POA: Diagnosis not present

## 2016-04-17 DIAGNOSIS — Z853 Personal history of malignant neoplasm of breast: Secondary | ICD-10-CM | POA: Diagnosis not present

## 2016-04-17 DIAGNOSIS — Z5181 Encounter for therapeutic drug level monitoring: Secondary | ICD-10-CM | POA: Diagnosis not present

## 2016-04-17 DIAGNOSIS — I10 Essential (primary) hypertension: Secondary | ICD-10-CM | POA: Diagnosis not present

## 2016-04-18 DIAGNOSIS — Z7984 Long term (current) use of oral hypoglycemic drugs: Secondary | ICD-10-CM | POA: Diagnosis not present

## 2016-04-18 DIAGNOSIS — E119 Type 2 diabetes mellitus without complications: Secondary | ICD-10-CM | POA: Diagnosis not present

## 2016-04-18 DIAGNOSIS — F17219 Nicotine dependence, cigarettes, with unspecified nicotine-induced disorders: Secondary | ICD-10-CM | POA: Diagnosis not present

## 2016-04-18 DIAGNOSIS — Z853 Personal history of malignant neoplasm of breast: Secondary | ICD-10-CM | POA: Diagnosis not present

## 2016-04-18 DIAGNOSIS — E785 Hyperlipidemia, unspecified: Secondary | ICD-10-CM | POA: Diagnosis not present

## 2016-04-18 DIAGNOSIS — Z9011 Acquired absence of right breast and nipple: Secondary | ICD-10-CM | POA: Diagnosis not present

## 2016-04-18 DIAGNOSIS — I10 Essential (primary) hypertension: Secondary | ICD-10-CM | POA: Diagnosis not present

## 2016-04-18 DIAGNOSIS — Z5181 Encounter for therapeutic drug level monitoring: Secondary | ICD-10-CM | POA: Diagnosis not present

## 2016-04-18 DIAGNOSIS — I69354 Hemiplegia and hemiparesis following cerebral infarction affecting left non-dominant side: Secondary | ICD-10-CM | POA: Diagnosis not present

## 2016-04-19 DIAGNOSIS — I69354 Hemiplegia and hemiparesis following cerebral infarction affecting left non-dominant side: Secondary | ICD-10-CM | POA: Diagnosis not present

## 2016-04-19 DIAGNOSIS — Z853 Personal history of malignant neoplasm of breast: Secondary | ICD-10-CM | POA: Diagnosis not present

## 2016-04-19 DIAGNOSIS — F17219 Nicotine dependence, cigarettes, with unspecified nicotine-induced disorders: Secondary | ICD-10-CM | POA: Diagnosis not present

## 2016-04-19 DIAGNOSIS — Z7984 Long term (current) use of oral hypoglycemic drugs: Secondary | ICD-10-CM | POA: Diagnosis not present

## 2016-04-19 DIAGNOSIS — E785 Hyperlipidemia, unspecified: Secondary | ICD-10-CM | POA: Diagnosis not present

## 2016-04-19 DIAGNOSIS — Z9011 Acquired absence of right breast and nipple: Secondary | ICD-10-CM | POA: Diagnosis not present

## 2016-04-19 DIAGNOSIS — Z5181 Encounter for therapeutic drug level monitoring: Secondary | ICD-10-CM | POA: Diagnosis not present

## 2016-04-19 DIAGNOSIS — I10 Essential (primary) hypertension: Secondary | ICD-10-CM | POA: Diagnosis not present

## 2016-04-19 DIAGNOSIS — E119 Type 2 diabetes mellitus without complications: Secondary | ICD-10-CM | POA: Diagnosis not present

## 2016-04-24 DIAGNOSIS — Z7984 Long term (current) use of oral hypoglycemic drugs: Secondary | ICD-10-CM | POA: Diagnosis not present

## 2016-04-24 DIAGNOSIS — Z5181 Encounter for therapeutic drug level monitoring: Secondary | ICD-10-CM | POA: Diagnosis not present

## 2016-04-24 DIAGNOSIS — E785 Hyperlipidemia, unspecified: Secondary | ICD-10-CM | POA: Diagnosis not present

## 2016-04-24 DIAGNOSIS — I10 Essential (primary) hypertension: Secondary | ICD-10-CM | POA: Diagnosis not present

## 2016-04-24 DIAGNOSIS — Z853 Personal history of malignant neoplasm of breast: Secondary | ICD-10-CM | POA: Diagnosis not present

## 2016-04-24 DIAGNOSIS — F17219 Nicotine dependence, cigarettes, with unspecified nicotine-induced disorders: Secondary | ICD-10-CM | POA: Diagnosis not present

## 2016-04-24 DIAGNOSIS — Z9011 Acquired absence of right breast and nipple: Secondary | ICD-10-CM | POA: Diagnosis not present

## 2016-04-24 DIAGNOSIS — E119 Type 2 diabetes mellitus without complications: Secondary | ICD-10-CM | POA: Diagnosis not present

## 2016-04-24 DIAGNOSIS — I69354 Hemiplegia and hemiparesis following cerebral infarction affecting left non-dominant side: Secondary | ICD-10-CM | POA: Diagnosis not present

## 2016-04-27 DIAGNOSIS — Z5181 Encounter for therapeutic drug level monitoring: Secondary | ICD-10-CM | POA: Diagnosis not present

## 2016-04-27 DIAGNOSIS — Z9011 Acquired absence of right breast and nipple: Secondary | ICD-10-CM | POA: Diagnosis not present

## 2016-04-27 DIAGNOSIS — F17219 Nicotine dependence, cigarettes, with unspecified nicotine-induced disorders: Secondary | ICD-10-CM | POA: Diagnosis not present

## 2016-04-27 DIAGNOSIS — E785 Hyperlipidemia, unspecified: Secondary | ICD-10-CM | POA: Diagnosis not present

## 2016-04-27 DIAGNOSIS — E119 Type 2 diabetes mellitus without complications: Secondary | ICD-10-CM | POA: Diagnosis not present

## 2016-04-27 DIAGNOSIS — Z853 Personal history of malignant neoplasm of breast: Secondary | ICD-10-CM | POA: Diagnosis not present

## 2016-04-27 DIAGNOSIS — I10 Essential (primary) hypertension: Secondary | ICD-10-CM | POA: Diagnosis not present

## 2016-04-27 DIAGNOSIS — I69354 Hemiplegia and hemiparesis following cerebral infarction affecting left non-dominant side: Secondary | ICD-10-CM | POA: Diagnosis not present

## 2016-04-27 DIAGNOSIS — Z7984 Long term (current) use of oral hypoglycemic drugs: Secondary | ICD-10-CM | POA: Diagnosis not present

## 2016-05-01 DIAGNOSIS — E039 Hypothyroidism, unspecified: Secondary | ICD-10-CM | POA: Diagnosis not present

## 2016-05-01 DIAGNOSIS — G8190 Hemiplegia, unspecified affecting unspecified side: Secondary | ICD-10-CM | POA: Diagnosis not present

## 2016-05-01 DIAGNOSIS — K219 Gastro-esophageal reflux disease without esophagitis: Secondary | ICD-10-CM | POA: Diagnosis not present

## 2016-05-01 DIAGNOSIS — I69354 Hemiplegia and hemiparesis following cerebral infarction affecting left non-dominant side: Secondary | ICD-10-CM | POA: Diagnosis not present

## 2016-05-01 DIAGNOSIS — F17219 Nicotine dependence, cigarettes, with unspecified nicotine-induced disorders: Secondary | ICD-10-CM | POA: Diagnosis not present

## 2016-05-01 DIAGNOSIS — J40 Bronchitis, not specified as acute or chronic: Secondary | ICD-10-CM | POA: Diagnosis not present

## 2016-05-01 DIAGNOSIS — Z5181 Encounter for therapeutic drug level monitoring: Secondary | ICD-10-CM | POA: Diagnosis not present

## 2016-05-01 DIAGNOSIS — F419 Anxiety disorder, unspecified: Secondary | ICD-10-CM | POA: Diagnosis not present

## 2016-05-01 DIAGNOSIS — Z23 Encounter for immunization: Secondary | ICD-10-CM | POA: Diagnosis not present

## 2016-05-01 DIAGNOSIS — F17200 Nicotine dependence, unspecified, uncomplicated: Secondary | ICD-10-CM | POA: Diagnosis not present

## 2016-05-01 DIAGNOSIS — Z7984 Long term (current) use of oral hypoglycemic drugs: Secondary | ICD-10-CM | POA: Diagnosis not present

## 2016-05-01 DIAGNOSIS — E119 Type 2 diabetes mellitus without complications: Secondary | ICD-10-CM | POA: Diagnosis not present

## 2016-05-01 DIAGNOSIS — Z853 Personal history of malignant neoplasm of breast: Secondary | ICD-10-CM | POA: Diagnosis not present

## 2016-05-01 DIAGNOSIS — C50919 Malignant neoplasm of unspecified site of unspecified female breast: Secondary | ICD-10-CM | POA: Diagnosis not present

## 2016-05-01 DIAGNOSIS — M545 Low back pain: Secondary | ICD-10-CM | POA: Diagnosis not present

## 2016-05-01 DIAGNOSIS — I10 Essential (primary) hypertension: Secondary | ICD-10-CM | POA: Diagnosis not present

## 2016-05-01 DIAGNOSIS — E785 Hyperlipidemia, unspecified: Secondary | ICD-10-CM | POA: Diagnosis not present

## 2016-05-01 DIAGNOSIS — J410 Simple chronic bronchitis: Secondary | ICD-10-CM | POA: Diagnosis not present

## 2016-05-01 DIAGNOSIS — Z9011 Acquired absence of right breast and nipple: Secondary | ICD-10-CM | POA: Diagnosis not present

## 2016-05-02 DIAGNOSIS — I10 Essential (primary) hypertension: Secondary | ICD-10-CM | POA: Diagnosis not present

## 2016-05-02 DIAGNOSIS — Z7984 Long term (current) use of oral hypoglycemic drugs: Secondary | ICD-10-CM | POA: Diagnosis not present

## 2016-05-02 DIAGNOSIS — I69354 Hemiplegia and hemiparesis following cerebral infarction affecting left non-dominant side: Secondary | ICD-10-CM | POA: Diagnosis not present

## 2016-05-02 DIAGNOSIS — Z853 Personal history of malignant neoplasm of breast: Secondary | ICD-10-CM | POA: Diagnosis not present

## 2016-05-02 DIAGNOSIS — E785 Hyperlipidemia, unspecified: Secondary | ICD-10-CM | POA: Diagnosis not present

## 2016-05-02 DIAGNOSIS — E119 Type 2 diabetes mellitus without complications: Secondary | ICD-10-CM | POA: Diagnosis not present

## 2016-05-02 DIAGNOSIS — F17219 Nicotine dependence, cigarettes, with unspecified nicotine-induced disorders: Secondary | ICD-10-CM | POA: Diagnosis not present

## 2016-05-02 DIAGNOSIS — Z5181 Encounter for therapeutic drug level monitoring: Secondary | ICD-10-CM | POA: Diagnosis not present

## 2016-05-02 DIAGNOSIS — Z9011 Acquired absence of right breast and nipple: Secondary | ICD-10-CM | POA: Diagnosis not present

## 2016-05-04 DIAGNOSIS — E119 Type 2 diabetes mellitus without complications: Secondary | ICD-10-CM | POA: Diagnosis not present

## 2016-05-04 DIAGNOSIS — Z853 Personal history of malignant neoplasm of breast: Secondary | ICD-10-CM | POA: Diagnosis not present

## 2016-05-04 DIAGNOSIS — Z7984 Long term (current) use of oral hypoglycemic drugs: Secondary | ICD-10-CM | POA: Diagnosis not present

## 2016-05-04 DIAGNOSIS — I69354 Hemiplegia and hemiparesis following cerebral infarction affecting left non-dominant side: Secondary | ICD-10-CM | POA: Diagnosis not present

## 2016-05-04 DIAGNOSIS — Z5181 Encounter for therapeutic drug level monitoring: Secondary | ICD-10-CM | POA: Diagnosis not present

## 2016-05-04 DIAGNOSIS — F17219 Nicotine dependence, cigarettes, with unspecified nicotine-induced disorders: Secondary | ICD-10-CM | POA: Diagnosis not present

## 2016-05-04 DIAGNOSIS — Z9011 Acquired absence of right breast and nipple: Secondary | ICD-10-CM | POA: Diagnosis not present

## 2016-05-04 DIAGNOSIS — E785 Hyperlipidemia, unspecified: Secondary | ICD-10-CM | POA: Diagnosis not present

## 2016-05-04 DIAGNOSIS — I10 Essential (primary) hypertension: Secondary | ICD-10-CM | POA: Diagnosis not present

## 2016-05-16 DIAGNOSIS — E785 Hyperlipidemia, unspecified: Secondary | ICD-10-CM | POA: Diagnosis not present

## 2016-05-16 DIAGNOSIS — I69354 Hemiplegia and hemiparesis following cerebral infarction affecting left non-dominant side: Secondary | ICD-10-CM | POA: Diagnosis not present

## 2016-05-16 DIAGNOSIS — Z9011 Acquired absence of right breast and nipple: Secondary | ICD-10-CM | POA: Diagnosis not present

## 2016-05-16 DIAGNOSIS — E119 Type 2 diabetes mellitus without complications: Secondary | ICD-10-CM | POA: Diagnosis not present

## 2016-05-16 DIAGNOSIS — Z5181 Encounter for therapeutic drug level monitoring: Secondary | ICD-10-CM | POA: Diagnosis not present

## 2016-05-16 DIAGNOSIS — Z7984 Long term (current) use of oral hypoglycemic drugs: Secondary | ICD-10-CM | POA: Diagnosis not present

## 2016-05-16 DIAGNOSIS — F17219 Nicotine dependence, cigarettes, with unspecified nicotine-induced disorders: Secondary | ICD-10-CM | POA: Diagnosis not present

## 2016-05-16 DIAGNOSIS — Z853 Personal history of malignant neoplasm of breast: Secondary | ICD-10-CM | POA: Diagnosis not present

## 2016-05-16 DIAGNOSIS — I10 Essential (primary) hypertension: Secondary | ICD-10-CM | POA: Diagnosis not present

## 2016-08-02 DIAGNOSIS — G8190 Hemiplegia, unspecified affecting unspecified side: Secondary | ICD-10-CM | POA: Diagnosis not present

## 2016-08-02 DIAGNOSIS — I10 Essential (primary) hypertension: Secondary | ICD-10-CM | POA: Diagnosis not present

## 2016-08-02 DIAGNOSIS — F17200 Nicotine dependence, unspecified, uncomplicated: Secondary | ICD-10-CM | POA: Diagnosis not present

## 2016-08-02 DIAGNOSIS — E119 Type 2 diabetes mellitus without complications: Secondary | ICD-10-CM | POA: Diagnosis not present

## 2016-11-01 DIAGNOSIS — G8190 Hemiplegia, unspecified affecting unspecified side: Secondary | ICD-10-CM | POA: Diagnosis not present

## 2016-11-01 DIAGNOSIS — I10 Essential (primary) hypertension: Secondary | ICD-10-CM | POA: Diagnosis not present

## 2016-11-01 DIAGNOSIS — F17210 Nicotine dependence, cigarettes, uncomplicated: Secondary | ICD-10-CM | POA: Diagnosis not present

## 2016-11-01 DIAGNOSIS — E119 Type 2 diabetes mellitus without complications: Secondary | ICD-10-CM | POA: Diagnosis not present

## 2016-11-01 DIAGNOSIS — J441 Chronic obstructive pulmonary disease with (acute) exacerbation: Secondary | ICD-10-CM | POA: Diagnosis not present

## 2016-12-15 ENCOUNTER — Other Ambulatory Visit (HOSPITAL_COMMUNITY): Payer: Self-pay | Admitting: Internal Medicine

## 2016-12-15 DIAGNOSIS — Z1231 Encounter for screening mammogram for malignant neoplasm of breast: Secondary | ICD-10-CM

## 2017-01-08 DIAGNOSIS — E119 Type 2 diabetes mellitus without complications: Secondary | ICD-10-CM | POA: Diagnosis not present

## 2017-01-08 DIAGNOSIS — L2389 Allergic contact dermatitis due to other agents: Secondary | ICD-10-CM | POA: Diagnosis not present

## 2017-01-22 ENCOUNTER — Ambulatory Visit (HOSPITAL_COMMUNITY)
Admission: RE | Admit: 2017-01-22 | Discharge: 2017-01-22 | Disposition: A | Discharge status: Home / Self Care | Payer: Commercial Managed Care - HMO | Source: Ambulatory Visit | Attending: Internal Medicine | Admitting: Internal Medicine

## 2017-01-22 DIAGNOSIS — Z1231 Encounter for screening mammogram for malignant neoplasm of breast: Secondary | ICD-10-CM | POA: Insufficient documentation

## 2017-02-08 DIAGNOSIS — F17200 Nicotine dependence, unspecified, uncomplicated: Secondary | ICD-10-CM | POA: Diagnosis not present

## 2017-02-08 DIAGNOSIS — I10 Essential (primary) hypertension: Secondary | ICD-10-CM | POA: Diagnosis not present

## 2017-02-08 DIAGNOSIS — E119 Type 2 diabetes mellitus without complications: Secondary | ICD-10-CM | POA: Diagnosis not present

## 2017-02-08 DIAGNOSIS — J449 Chronic obstructive pulmonary disease, unspecified: Secondary | ICD-10-CM | POA: Diagnosis not present

## 2017-02-22 ENCOUNTER — Encounter (HOSPITAL_COMMUNITY): Payer: Commercial Managed Care - HMO | Attending: Oncology | Admitting: Oncology

## 2017-02-22 ENCOUNTER — Encounter (HOSPITAL_COMMUNITY): Payer: Self-pay

## 2017-02-22 VITALS — BP 137/52 | HR 78 | Resp 16 | Ht 65.0 in | Wt 135.0 lb

## 2017-02-22 DIAGNOSIS — M8580 Other specified disorders of bone density and structure, unspecified site: Secondary | ICD-10-CM | POA: Diagnosis not present

## 2017-02-22 DIAGNOSIS — I890 Lymphedema, not elsewhere classified: Secondary | ICD-10-CM | POA: Diagnosis not present

## 2017-02-22 DIAGNOSIS — Z853 Personal history of malignant neoplasm of breast: Secondary | ICD-10-CM

## 2017-02-22 DIAGNOSIS — C50911 Malignant neoplasm of unspecified site of right female breast: Secondary | ICD-10-CM

## 2017-02-22 DIAGNOSIS — Z720 Tobacco use: Secondary | ICD-10-CM | POA: Diagnosis not present

## 2017-02-22 NOTE — Progress Notes (Signed)
[facility] Note  Patient Care Team: Rosita Fire, MD as PCP - General (Internal Medicine)  CHIEF COMPLAINTS/PURPOSE OF CONSULTATION:  Stage I R breast cancer (T1B, N0,M0) R Modified radical mastectomy Arimidex therapy starting on 02/16/2009. She finished her Arimidex therapy in 2015 History of PE after appendectomy in 1974 Osteopenia by DEXA 02/12/2014  HISTORY OF PRESENTING ILLNESS:  [name] 76 y.o. female is here because of a history of stage I carcinoma of the right breast.  Patient presents today for continued follow-up. She states that she has been doing well since her last visit except for a cough, which she states started after she inhale some dust while she was mowing her lawn. She continues to smoke half a pack to 1 pack per day. She recently had a left breast mammogram in June which was negative for malignancy. She has not palpated any left breast masses when he switched his nodules or skin changes on her right chest wall.   MEDICAL HISTORY:  Past Medical History:  Diagnosis Date  . Breast CA ([place]) 01/16/2011  . Breast cancer ([place])    rt dx 2010; mastectomy only  . Embolism - blood clot    left lung  . Hypertension   . Infiltrating ductal carcinoma of breast ([place]) 01/16/2011   Started Arimidex on 02/24/2009. Stage IB (T1b N0 M0) grade 1 infiltrating ductal carcinoma of the right breast, status post right modified radical mastectomy on 01/22/2009 for a 10 mm cancer, ER +100%, PR +98%, KI-67 marker was 9%, HER-2 negative, no LVI identified, and 11 lymph nodes were negative, all margins were clear. She had some associated DCIS, is now on Arimidex and she will take that for  . Tendonitis 07/2010   shot of cortisone    SURGICAL HISTORY: Past Surgical History:  Procedure Laterality Date  . ABDOMINAL HYSTERECTOMY  1987  . APPENDECTOMY  1974   blood clot in lung  . BREAST BIOPSY  1968   left  . MASTECTOMY MODIFIED RADICAL   01/22/2009   rt  . NASAL SINUS SURGERY  2002  . TEAR DUCT PROBING    . tubal ligation  1972  . YAG LASER APPLICATION Right 0/73/7106   Procedure: YAG LASER APPLICATION;  Surgeon: Williams Che, MD;  Location: [facility];  Service: Ophthalmology;  Laterality: Right;    SOCIAL HISTORY: Social History   Social History  . Marital status: Divorced    Spouse name: N/A  . Number of children: N/A  . Years of education: N/A   Occupational History  . Not on file.   Social History Main Topics  . Smoking status: Current Every Day Smoker    Packs/day: 0.50    Types: Cigarettes  . Smokeless tobacco: Never Used  . Alcohol use No  . Drug use: Unknown  . Sexual activity: Not on file   Other Topics Concern  . Not on file   Social History Narrative  . No narrative on file  1 son who is disabled, 2 daughters 3 grandchildren 5 great grandchildren  FAMILY HISTORY: Family History  Problem Relation Age of Onset  . Breast cancer Sister   . Breast cancer Maternal Grandmother   . Colon cancer Daughter    indicated that her sister is alive. She indicated that her maternal grandmother is deceased. She indicated that her daughter is alive.    ALLERGIES:  is allergic to pollen extract.  MEDICATIONS:  Current Outpatient Prescriptions  Medication Sig Dispense Refill  . amLODipine (NORVASC) 5 MG tablet Take 5 mg by mouth daily.    Marland Kitchen aspirin 325 MG tablet Take 1 tablet (325 mg total) by mouth daily. 30 tablet 3  . Calcium Carb-Cholecalciferol (CALCIUM + D3 PO) Take 1 capsule by mouth 2 (two) times daily.     . fluticasone (FLONASE) 50 MCG/ACT nasal spray     . gabapentin (NEURONTIN) 300 MG capsule Take 300 mg by mouth at bedtime.     . lovastatin (MEVACOR) 40 MG tablet Take 40 mg by mouth at bedtime.     . metFORMIN (GLUCOPHAGE) 500 MG tablet Take 500 mg by mouth at bedtime.     . metoprolol tartrate (LOPRESSOR) 25 MG tablet Take 25 mg by mouth daily.     . naphazoline-glycerin (CLEAR EYES)  0.012-0.2 % SOLN Place 2 drops into both eyes daily as needed for irritation.    Marland Kitchen omeprazole (PRILOSEC) 20 MG capsule Take 1 capsule by mouth daily.    . TRADJENTA 5 MG TABS tablet Take 5 mg by mouth daily.      No current facility-administered medications for this visit.     Review of Systems  Constitutional: Negative for chills, fever, malaise/fatigue and weight loss.  HENT: Negative for congestion, hearing loss, nosebleeds, sore throat and tinnitus.   Eyes: Negative for blurred vision, double vision, pain and discharge.  Respiratory: Positive for cough (inhaled dust while mowing lawn). Negative for hemoptysis, sputum production, shortness of breath and wheezing.   Cardiovascular: Negative for chest pain, palpitations, claudication, leg swelling and PND.  Gastrointestinal: Negative for abdominal pain, blood in stool, constipation, diarrhea, heartburn, melena, nausea and vomiting.  Genitourinary: Negative for dysuria, frequency, hematuria and urgency.  Musculoskeletal: Negative for back pain, falls, joint pain and myalgias.  Skin: Negative for itching and rash.  Neurological: Negative for dizziness, tingling, tremors, sensory change, speech change, focal weakness, seizures, loss of consciousness, weakness and headaches.  Endo/Heme/Allergies: Does not bruise/bleed easily.  Psychiatric/Behavioral: Negative for depression, memory loss, substance abuse and suicidal ideas. The patient is not nervous/anxious and does not have insomnia.   All other systems reviewed and are negative.  14 point ROS was done and is otherwise as detailed above or in HPI   PHYSICAL EXAMINATION: ECOG PERFORMANCE STATUS: 0 - Asymptomatic  Vitals:   02/22/17 1040  BP: (!) 137/52  Pulse: 78  Resp: 16   Filed Weights   02/22/17 1040  Weight: 135 lb (61.2 kg)     Physical Exam  Constitutional: She is oriented to person, place, and time and well-developed, well-nourished, and in no distress.  HENT:  Head:  Normocephalic and atraumatic.  Nose: Nose normal.  Mouth/Throat: Oropharynx is clear and moist. No oropharyngeal exudate.  Eyes: Pupils are equal, round, and reactive to light. Conjunctivae and EOM are normal. Right eye exhibits no discharge. Left eye exhibits no discharge. No scleral icterus.  Neck: Normal range of motion. Neck supple. No tracheal deviation present. No thyromegaly present.  Cardiovascular: Normal rate, regular rhythm and normal heart sounds.  Exam reveals no gallop and no friction rub.   No murmur heard. Light swelling in right arm, she has never worn a sleeve and glove. She notes the changes in her arm are chronic.  Pulmonary/Chest: Effort normal and breath sounds normal. She has no wheezes. She has no rales.    Abdominal: Soft. Bowel sounds are normal. She exhibits no distension and no mass. There is no tenderness. There is no  rebound and no guarding.  Musculoskeletal: Normal range of motion. She exhibits no edema.  Lymphadenopathy:    She has no cervical adenopathy.  Neurological: She is alert and oriented to person, place, and time. She has normal reflexes. No cranial nerve deficit. Gait normal. Coordination normal.  Skin: Skin is warm and dry. No rash noted.  Nothing palpated on left outer calf. No skin lesion noted  Psychiatric: Mood, memory, affect and judgment normal.  Nursing note and vitals reviewed.   LABORATORY DATA:  I have reviewed the data as listed Lab Results  Component Value Date   WBC 7.3 03/22/2016   HGB 14.6 03/22/2016   HCT 43.0 03/22/2016   MCV 91.9 03/22/2016   PLT 162 03/22/2016   CMP     Component Value Date/Time   NA 139 03/22/2016 1235   K 4.3 03/22/2016 1235   CL 100 (L) 03/22/2016 1235   CO2 25 03/22/2016 1227   GLUCOSE 135 (H) 03/22/2016 1235   BUN 8 03/22/2016 1235   CREATININE 0.70 03/22/2016 1235   CALCIUM 9.2 03/22/2016 1227   PROT 7.8 03/22/2016 1227   ALBUMIN 4.2 03/22/2016 1227   AST 22 03/22/2016 1227   ALT 12 (L)  03/22/2016 1227   ALKPHOS 45 03/22/2016 1227   BILITOT 0.7 03/22/2016 1227   GFRNONAA >60 03/22/2016 1227   GFRAA >60 03/22/2016 1227      RADIOGRAPHIC STUDIES: I have personally reviewed the radiological images as listed and agreed with the findings in the report.   CLINICAL DATA:  Screening.  EXAM: 2D DIGITAL SCREENING UNILATERAL LEFT MAMMOGRAM WITH CAD AND ADJUNCT TOMO  COMPARISON:  Previous exam(s).  ACR Breast Density Category b: There are scattered areas of fibroglandular density.  FINDINGS: There are no findings suspicious for malignancy. Images were processed with CAD.  IMPRESSION: No mammographic evidence of malignancy. A result letter of this screening mammogram will be mailed directly to the patient.  RECOMMENDATION: Screening mammogram in one year. (Code:[id number]-B-01Y)  BI-RADS CATEGORY  1: Negative.   Electronically Signed   By: Altamese Cabal M.D.   On: 01/25/2016 09:16    ASSESSMENT & PLAN:  Stage I R breast cancer (T1B, N0,M0) R Modified radical mastectomy Arimidex therapy starting on 02/16/2009 History of PE after appendectomy in 1974 Osteopenia by DEXA 02/12/2014 Tobacco use Lymphedema R extremity  Clinically NED on exam today.   Her mammograms are ordered by primary care, last mammogram was in June, next mammogram in June 2019.  She has osteopenia and I have encouraged weightbearing exercise and ongoing calcium and vitamin D.  I have encouraged smoking cessation.  RTC in 1 year for follow up.  All questions were answered. The patient knows to call the clinic with any problems, questions or concerns.    This note is electronically signed.  Twana First, MD

## 2017-02-23 ENCOUNTER — Ambulatory Visit (HOSPITAL_COMMUNITY): Payer: Commercial Managed Care - HMO

## 2017-03-28 DIAGNOSIS — E119 Type 2 diabetes mellitus without complications: Secondary | ICD-10-CM | POA: Diagnosis not present

## 2017-03-28 DIAGNOSIS — N390 Urinary tract infection, site not specified: Secondary | ICD-10-CM | POA: Diagnosis not present

## 2017-04-29 ENCOUNTER — Emergency Department (HOSPITAL_COMMUNITY)
Admission: EM | Admit: 2017-04-29 | Discharge: 2017-04-29 | Disposition: A | Discharge status: Home / Self Care | Payer: Medicare HMO | Attending: Emergency Medicine | Admitting: Emergency Medicine

## 2017-04-29 ENCOUNTER — Encounter (HOSPITAL_COMMUNITY): Payer: Self-pay

## 2017-04-29 ENCOUNTER — Emergency Department (HOSPITAL_COMMUNITY): Payer: Medicare HMO

## 2017-04-29 DIAGNOSIS — Z7984 Long term (current) use of oral hypoglycemic drugs: Secondary | ICD-10-CM | POA: Insufficient documentation

## 2017-04-29 DIAGNOSIS — Z8673 Personal history of transient ischemic attack (TIA), and cerebral infarction without residual deficits: Secondary | ICD-10-CM | POA: Diagnosis not present

## 2017-04-29 DIAGNOSIS — E119 Type 2 diabetes mellitus without complications: Secondary | ICD-10-CM | POA: Diagnosis not present

## 2017-04-29 DIAGNOSIS — E785 Hyperlipidemia, unspecified: Secondary | ICD-10-CM | POA: Insufficient documentation

## 2017-04-29 DIAGNOSIS — F17210 Nicotine dependence, cigarettes, uncomplicated: Secondary | ICD-10-CM | POA: Insufficient documentation

## 2017-04-29 DIAGNOSIS — R109 Unspecified abdominal pain: Secondary | ICD-10-CM | POA: Diagnosis not present

## 2017-04-29 DIAGNOSIS — Z7982 Long term (current) use of aspirin: Secondary | ICD-10-CM | POA: Insufficient documentation

## 2017-04-29 DIAGNOSIS — I10 Essential (primary) hypertension: Secondary | ICD-10-CM | POA: Diagnosis not present

## 2017-04-29 DIAGNOSIS — Z86718 Personal history of other venous thrombosis and embolism: Secondary | ICD-10-CM | POA: Diagnosis not present

## 2017-04-29 DIAGNOSIS — Z853 Personal history of malignant neoplasm of breast: Secondary | ICD-10-CM | POA: Insufficient documentation

## 2017-04-29 DIAGNOSIS — Z79899 Other long term (current) drug therapy: Secondary | ICD-10-CM | POA: Insufficient documentation

## 2017-04-29 DIAGNOSIS — R1030 Lower abdominal pain, unspecified: Secondary | ICD-10-CM | POA: Diagnosis not present

## 2017-04-29 DIAGNOSIS — N390 Urinary tract infection, site not specified: Secondary | ICD-10-CM | POA: Insufficient documentation

## 2017-04-29 DIAGNOSIS — R350 Frequency of micturition: Secondary | ICD-10-CM | POA: Diagnosis present

## 2017-04-29 LAB — CBC WITH DIFFERENTIAL/PLATELET
Basophils Absolute: 0 10*3/uL (ref 0.0–0.1)
Basophils Relative: 0 %
Eosinophils Absolute: 0.1 10*3/uL (ref 0.0–0.7)
Eosinophils Relative: 1 %
HEMATOCRIT: 39.5 % (ref 36.0–46.0)
HEMOGLOBIN: 13.4 g/dL (ref 12.0–15.0)
LYMPHS ABS: 1.3 10*3/uL (ref 0.7–4.0)
Lymphocytes Relative: 18 %
MCH: 30.1 pg (ref 26.0–34.0)
MCHC: 33.9 g/dL (ref 30.0–36.0)
MCV: 88.8 fL (ref 78.0–100.0)
MONO ABS: 0.4 10*3/uL (ref 0.1–1.0)
MONOS PCT: 5 %
NEUTROS ABS: 5.6 10*3/uL (ref 1.7–7.7)
NEUTROS PCT: 76 %
Platelets: 161 10*3/uL (ref 150–400)
RBC: 4.45 MIL/uL (ref 3.87–5.11)
RDW: 13.3 % (ref 11.5–15.5)
WBC: 7.5 10*3/uL (ref 4.0–10.5)

## 2017-04-29 LAB — COMPREHENSIVE METABOLIC PANEL
ALK PHOS: 58 U/L (ref 38–126)
ALT: 13 U/L — ABNORMAL LOW (ref 14–54)
ANION GAP: 9 (ref 5–15)
AST: 17 U/L (ref 15–41)
Albumin: 4 g/dL (ref 3.5–5.0)
BUN: 13 mg/dL (ref 6–20)
CALCIUM: 9.6 mg/dL (ref 8.9–10.3)
CO2: 29 mmol/L (ref 22–32)
Chloride: 99 mmol/L — ABNORMAL LOW (ref 101–111)
Creatinine, Ser: 0.74 mg/dL (ref 0.44–1.00)
GFR calc Af Amer: >60 mL/min (ref >60)
GFR calc non Af Amer: >60 mL/min (ref >60)
GLUCOSE: 180 mg/dL — AB (ref 65–99)
Potassium: 3.9 mmol/L (ref 3.5–5.1)
Sodium: 137 mmol/L (ref 135–145)
Total Bilirubin: 0.7 mg/dL (ref 0.3–1.2)
Total Protein: 7.9 g/dL (ref 6.5–8.1)

## 2017-04-29 LAB — URINALYSIS, ROUTINE W REFLEX MICROSCOPIC
BILIRUBIN URINE: NEGATIVE
GLUCOSE, UA: NEGATIVE mg/dL
KETONES UR: NEGATIVE mg/dL
NITRITE: NEGATIVE
PH: 8 (ref 5.0–8.0)
Protein, ur: NEGATIVE mg/dL
SPECIFIC GRAVITY, URINE: 1.009 (ref 1.005–1.030)

## 2017-04-29 LAB — LIPASE, BLOOD: Lipase: 66 U/L — ABNORMAL HIGH (ref 11–51)

## 2017-04-29 MED ORDER — ACETAMINOPHEN 325 MG PO TABS
650.0000 mg | ORAL_TABLET | Freq: Once | ORAL | Status: AC
  Administered 2017-04-29: 650 mg via ORAL
  Filled 2017-04-29: qty 2

## 2017-04-29 MED ORDER — IOPAMIDOL (ISOVUE-300) INJECTION 61%
100.0000 mL | Freq: Once | INTRAVENOUS | Status: AC | PRN
  Administered 2017-04-29: 100 mL via INTRAVENOUS

## 2017-04-29 MED ORDER — CEPHALEXIN 500 MG PO CAPS
500.0000 mg | ORAL_CAPSULE | Freq: Once | ORAL | Status: AC
  Administered 2017-04-29: 500 mg via ORAL
  Filled 2017-04-29: qty 1

## 2017-04-29 MED ORDER — CEPHALEXIN 500 MG PO CAPS: 500.0000 mg | ORAL_CAPSULE | Freq: Two times a day (BID) | ORAL | 0 refills | Status: AC

## 2017-04-29 NOTE — ED Triage Notes (Signed)
Reports of dysuria and lower abdominal pain since Wednesday. States she feels like she her bladder is going to fall out. Also reports of nausea.

## 2017-04-29 NOTE — Discharge Instructions (Signed)
As discussed, make sure that you stay well-hydrated drinking enough fluids to keep your urine clear. Taking entire course of antibiotics even if you feel better.  Follow-up with your primary care provider for repeat urine to reassess hematuria.  Return if symptoms worsen, increased abdominal pain, fever, chills, back pain, continued urinary symptoms or any other new concerning symptoms in the meantime.

## 2017-04-29 NOTE — ED Provider Notes (Signed)
[name] Provider Note   CSN: [id number] Arrival date & time: 04/29/17  0814     History   Chief Complaint Chief Complaint  Patient presents with  . Abdominal Pain  . Dysuria    HPI [name] is a 76 y.o. female history significant for breast cancer, hysterectomy with unilateral oophorectomy, patient unsure about laterality. Currently in remission and recently had an appointment in July where everything was normal. Presents with 4 days of suprapubic pressure, burning on urination and frequency with intermittent pain. She reports associated hot sweats this morning which tend to happen when her blood glucose is low but it was normal today. Denies fever, chills, hematuria, vaginal bleeding or discharge. She has tried Alka-Seltzer and which have helped moderately. She reports being treated for UTI 3 months ago no recent antibiotics. She has experienced some nausea but no vomiting. She explains that it feels as though her bladder is pushing through heavy pressure in her pelvic area.    Past surgical history includes appendectomy  HPI  Past Medical History:  Diagnosis Date  . Breast CA ([place]) 01/16/2011  . Breast cancer ([place])    rt dx 2010; mastectomy only  . Embolism - blood clot    left lung  . Hypertension   . Infiltrating ductal carcinoma of breast ([place]) 01/16/2011   Started Arimidex on 02/24/2009. Stage IB (T1b N0 M0) grade 1 infiltrating ductal carcinoma of the right breast, status post right modified radical mastectomy on 01/22/2009 for a 10 mm cancer, ER +100%, PR +98%, KI-67 marker was 9%, HER-2 negative, no LVI identified, and 11 lymph nodes were negative, all margins were clear. She had some associated DCIS, is now on Arimidex and she will take that for  . Tendonitis 07/2010   shot of cortisone    Patient Active Problem List   Diagnosis Date Noted  . Stroke (cerebrum) ([place]) 03/22/2016  . Tobacco use disorder 03/22/2016  . Diabetes mellitus type 2 in nonobese  ([place]) 03/22/2016  . Benign essential HTN 03/22/2016  . Hyperlipidemia 03/22/2016  . Acute left-sided weakness 03/22/2016  . Osteopenia 01/25/2014  . Embolism - blood clot   . Infiltrating ductal carcinoma of breast ([place]) 01/16/2011  . Tendonitis   . DEQUERVAIN'S 08/10/2010    Past Surgical History:  Procedure Laterality Date  . ABDOMINAL HYSTERECTOMY  1987  . APPENDECTOMY  1974   blood clot in lung  . BREAST BIOPSY  1968   left  . MASTECTOMY MODIFIED RADICAL  01/22/2009   rt  . NASAL SINUS SURGERY  2002  . TEAR DUCT PROBING    . tubal ligation  1972  . YAG LASER APPLICATION Right 4/81/8563   Procedure: YAG LASER APPLICATION;  Surgeon: Williams Che, MD;  Location: [facility];  Service: Ophthalmology;  Laterality: Right;    OB History    No data available       Home Medications    Prior to Admission medications   Medication Sig Start Date End Date Taking? Authorizing Provider  albuterol (PROVENTIL HFA;VENTOLIN HFA) 108 (90 Base) MCG/ACT inhaler Inhale 1 puff into the lungs every 6 (six) hours as needed for wheezing or shortness of breath.   Yes [provider]  amLODipine (NORVASC) 5 MG tablet Take 5 mg by mouth daily.   Yes [provider]  aspirin 325 MG tablet Take 1 tablet (325 mg total) by mouth daily. 03/24/16  Yes Rosita Fire, MD  Calcium Carb-Cholecalciferol (CALCIUM + D3 PO) Take 1  capsule by mouth 2 (two) times daily.    Yes [provider]  fluticasone Asencion Islam) 50 MCG/ACT nasal spray  01/12/16  Yes [provider]  gabapentin (NEURONTIN) 300 MG capsule Take 300 mg by mouth at bedtime.  02/12/16  Yes [provider]  lovastatin (MEVACOR) 40 MG tablet Take 40 mg by mouth at bedtime.    Yes [provider]  metFORMIN (GLUCOPHAGE) 500 MG tablet Take 500 mg by mouth 2 (two) times daily.  02/12/16  Yes [provider]  metoprolol tartrate (LOPRESSOR) 25 MG tablet Take 25 mg by mouth daily.    Yes [provider]  naphazoline-glycerin (CLEAR EYES) 0.012-0.2 % SOLN Place 2 drops into both eyes daily as needed for irritation.   Yes [provider]  omeprazole (PRILOSEC) 20 MG capsule Take 1 capsule by mouth daily. 06/28/13  Yes [provider]  tiotropium (SPIRIVA) 18 MCG inhalation capsule Place 18 mcg into inhaler and inhale daily as needed.   Yes [provider]  TRADJENTA 5 MG TABS tablet Take 5 mg by mouth daily.  02/12/16  Yes [provider]  cephALEXin (KEFLEX) 500 MG capsule Take 1 capsule (500 mg total) by mouth 2 (two) times daily. 04/29/17 05/06/17  Emeline General, PA-C    Family History Family History  Problem Relation Age of Onset  . Breast cancer Sister   . Breast cancer Maternal Grandmother   . Colon cancer Daughter     Social History Social History  Substance Use Topics  . Smoking status: Current Every Day Smoker    Packs/day: 0.50    Types: Cigarettes  . Smokeless tobacco: Never Used  . Alcohol use No     Allergies   Pollen extract   Review of Systems Review of Systems  Constitutional: Negative for chills, fatigue and fever.  Respiratory: Negative for shortness of breath and wheezing.   Cardiovascular: Negative for chest pain and palpitations.  Gastrointestinal: Positive for nausea. Negative for abdominal distention, abdominal pain, blood in stool, diarrhea and vomiting.       Nausea has now resolved. Denies melena or hematochezia.  Genitourinary: Positive for dysuria, frequency and pelvic pain. Negative for difficulty urinating, flank pain, hematuria, vaginal bleeding and vaginal discharge.  Musculoskeletal: Negative for myalgias.  Skin: Negative for color change, pallor and rash.  Neurological: Negative for dizziness and light-headedness.     Physical Exam Updated Vital Signs BP (!) 157/69 (BP Location: Left Arm)   Pulse 72   Temp 97.7 F (36.5 C) (Oral)   Resp 18   Ht 5' 6"  (1.676 m)   Wt 62.6 kg (138  lb)   SpO2 98%   BMI 22.27 kg/m   Physical Exam  Constitutional: She appears well-developed and well-nourished. No distress.  Afebrile, nontoxic-appearing, sitting comfortably in chair in no acute distress.  HENT:  Head: Normocephalic and atraumatic.  Eyes: EOM are normal.  Neck: Normal range of motion. Neck supple.  Cardiovascular: Normal rate, regular rhythm and normal heart sounds.   No murmur heard. Pulmonary/Chest: Effort normal and breath sounds normal. No respiratory distress. She has no wheezes. She has no rales. She exhibits no tenderness.  Abdominal: Soft. She exhibits no distension and no mass. There is tenderness. There is no rebound and no guarding.  Suprapubic tenderness palpation and right adnexal/RLQ tenderness  Musculoskeletal: Normal range of motion. She exhibits no edema or deformity.  Neurological: She is alert.  Skin: Skin is warm and dry. No rash noted.  She is not diaphoretic. No erythema. No pallor.  Psychiatric: She has a normal mood and affect.  Nursing note and vitals reviewed.    ED Treatments / Results  Labs (all labs ordered are listed, but only abnormal results are displayed) Labs Reviewed  URINALYSIS, ROUTINE W REFLEX MICROSCOPIC - Abnormal; Notable for the following:       Result Value   APPearance HAZY (*)    Hgb urine dipstick SMALL (*)    Leukocytes, UA LARGE (*)    Bacteria, UA RARE (*)    Squamous Epithelial / LPF 0-5 (*)    All other components within normal limits  COMPREHENSIVE METABOLIC PANEL - Abnormal; Notable for the following:    Chloride 99 (*)    Glucose, Bld 180 (*)    ALT 13 (*)    All other components within normal limits  LIPASE, BLOOD - Abnormal; Notable for the following:    Lipase 66 (*)    All other components within normal limits  URINE CULTURE  CBC WITH DIFFERENTIAL/PLATELET    EKG  EKG Interpretation None       Radiology Ct Abdomen Pelvis W Contrast  Result Date: 04/29/2017 CLINICAL DATA:  Low  abdominal pain and dysuria for 5 days. History of right breast cancer, hysterectomy and appendectomy. EXAM: CT ABDOMEN AND PELVIS WITH CONTRAST TECHNIQUE: Multidetector CT imaging of the abdomen and pelvis was performed using the standard protocol following bolus administration of intravenous contrast. CONTRAST:  171m ISOVUE-300 IOPAMIDOL (ISOVUE-300) INJECTION 61% COMPARISON:  CT 11/19/2007. FINDINGS: Lower chest: There is progressive subpleural scarring and honeycomb formation at both lung bases. No significant pleural or pericardial effusion. Atherosclerosis of the aorta and coronary arteries noted. Hepatobiliary: The liver is normal in density without focal abnormality. No evidence of gallstones, gallbladder wall thickening or biliary dilatation. Pancreas: Stable in appearance without acute findings. There is no pancreatic ductal dilatation or surrounding inflammation, although there is a probable pancreas divisum. Spleen: Normal in size without focal abnormality. Adrenals/Urinary Tract: Both adrenal glands appear normal. No evidence of renal mass, hydronephrosis or urinary tract calculus. There is a 16 mm cyst in the upper pole of the left kidney. Tiny low-density lesion anteriorly in the mid right kidney is too small to characterize, but probably a cyst as well. Probable pelvic floor laxity without focal bladder abnormality. Stomach/Bowel: No evidence of bowel wall thickening, distention or surrounding inflammatory change. There are fairly extensive diverticular changes throughout the distal colon. Vascular/Lymphatic: There are no enlarged abdominal or pelvic lymph nodes. Aortic and branch vessel atherosclerosis. No evidence of aneurysm or large vessel occlusion. Reproductive: Hysterectomy. Probable residual ovarian tissue bilaterally, stable. No adnexal mass. Other: Small umbilical hernia containing only fat. No ascites. 2.3 cm fatty mass with calcification posterior to the liver on image 27 is unchanged.  Musculoskeletal: No acute or significant osseous findings. Multilevel spondylosis has mildly progressed. IMPRESSION: 1. No acute findings or clear explanation for the patient's symptoms. 2. Probable pelvic floor laxity post hysterectomy. 3. Progressive scarring at the lung bases. 4. Stable fatty and partially calcified right retroperitoneal lesion. 5. Extensive distal colonic diverticulosis. 6.  Aortic Atherosclerosis (ICD10-I70.0). Electronically Signed   By: WRichardean SaleM.D.   On: 04/29/2017 13:05    Procedures Procedures (including critical care time)  Medications Ordered in ED Medications  iopamidol (ISOVUE-300) 61 % injection 100 mL (100 mLs Intravenous Contrast Given 04/29/17 1207)  cephALEXin (KEFLEX) capsule 500 mg (500 mg Oral Given 04/29/17 1309)  acetaminophen (TYLENOL) tablet 650  mg (650 mg Oral Given 04/29/17 1309)     Initial Impression / Assessment and Plan / ED Course  I have reviewed the triage vital signs and the nursing notes.  Pertinent labs & imaging results that were available during my care of the patient were reviewed by me and considered in my medical decision making (see chart for details).     Presenting with suprapubic discomfort and right lower quadrant pain, dysuria and urinary frequency.  Patient was very tender to palpation in her right adnexa. Given patient's age and history of malignancy obtained CT imaging to evaluate for any abnormal abdominal mass causing symptoms.  CT negative for acute intra-abdominal abnormality  Advised patient to avoid BC powders and excessive aspirin products. Educated the patient on acetaminophen maximum dose per day an over-the-counter medications containing acetaminophen.  Urine with evidence of UTI, culture sent and patient will be treated for urinary tract infection. Discharge home with antibiotics and close follow-up with PCP for repeat urine and reevaluate hematuria. She was advised to stay well-hydrated.  Discussed  strict return precautions and advised to return to the emergency department if experiencing any new or worsening symptoms. Instructions were understood and patient agreed with discharge plan.  Final Clinical Impressions(s) / ED Diagnoses   Final diagnoses:  Lower urinary tract infectious disease    New Prescriptions Discharge Medication List as of 04/29/2017  1:43 PM    START taking these medications   Details  cephALEXin (KEFLEX) 500 MG capsule Take 1 capsule (500 mg total) by mouth 2 (two) times daily., Starting Sun 04/29/2017, Until Sun 05/06/2017, Print         Avie Echevaria B, PA-C 04/29/17 1925    Sherwood Gambler, MD 05/01/17 1650

## 2017-05-02 LAB — URINE CULTURE

## 2017-05-03 ENCOUNTER — Telehealth: Payer: Self-pay | Admitting: *Deleted

## 2017-05-03 NOTE — Telephone Encounter (Signed)
Post ED Visit - Positive Culture Follow-up  Culture report reviewed by antimicrobial stewardship pharmacist:  []  Elenor Quinones, Pharm.D. [x]  Heide Guile, Pharm.D., BCPS AQ-ID []  Parks Neptune, Pharm.D., BCPS []  Alycia Rossetti, Pharm.D., BCPS []  [place], Pharm.D., BCPS, AAHIVP []  Legrand Como, Pharm.D., BCPS, AAHIVP []  Salome Arnt, PharmD, BCPS []  Dimitri Ped, PharmD, BCPS []  Vincenza Hews, PharmD, BCPS  Positive urine culture Treated with Cephalexin, organism sensitive to the same and no further patient follow-up is required at this time.  Harlon Flor [facility] 05/03/2017, 9:47 AM

## 2017-05-11 DIAGNOSIS — J449 Chronic obstructive pulmonary disease, unspecified: Secondary | ICD-10-CM | POA: Diagnosis not present

## 2017-05-11 DIAGNOSIS — Z23 Encounter for immunization: Secondary | ICD-10-CM | POA: Diagnosis not present

## 2017-05-11 DIAGNOSIS — M545 Low back pain: Secondary | ICD-10-CM | POA: Diagnosis not present

## 2017-05-11 DIAGNOSIS — F17200 Nicotine dependence, unspecified, uncomplicated: Secondary | ICD-10-CM | POA: Diagnosis not present

## 2017-05-11 DIAGNOSIS — Z1389 Encounter for screening for other disorder: Secondary | ICD-10-CM | POA: Diagnosis not present

## 2017-05-11 DIAGNOSIS — I10 Essential (primary) hypertension: Secondary | ICD-10-CM | POA: Diagnosis not present

## 2017-05-11 DIAGNOSIS — C50919 Malignant neoplasm of unspecified site of unspecified female breast: Secondary | ICD-10-CM | POA: Diagnosis not present

## 2017-05-11 DIAGNOSIS — F17210 Nicotine dependence, cigarettes, uncomplicated: Secondary | ICD-10-CM | POA: Diagnosis not present

## 2017-05-11 DIAGNOSIS — E1142 Type 2 diabetes mellitus with diabetic polyneuropathy: Secondary | ICD-10-CM | POA: Diagnosis not present

## 2017-05-11 DIAGNOSIS — E039 Hypothyroidism, unspecified: Secondary | ICD-10-CM | POA: Diagnosis not present

## 2017-05-11 DIAGNOSIS — E119 Type 2 diabetes mellitus without complications: Secondary | ICD-10-CM | POA: Diagnosis not present

## 2017-05-11 DIAGNOSIS — J40 Bronchitis, not specified as acute or chronic: Secondary | ICD-10-CM | POA: Diagnosis not present

## 2017-05-11 DIAGNOSIS — E785 Hyperlipidemia, unspecified: Secondary | ICD-10-CM | POA: Diagnosis not present

## 2017-05-11 DIAGNOSIS — K219 Gastro-esophageal reflux disease without esophagitis: Secondary | ICD-10-CM | POA: Diagnosis not present

## 2017-05-16 ENCOUNTER — Ambulatory Visit (HOSPITAL_COMMUNITY)
Admission: RE | Admit: 2017-05-16 | Discharge: 2017-05-16 | Disposition: A | Discharge status: Home / Self Care | Payer: Medicare HMO | Source: Ambulatory Visit | Attending: Internal Medicine | Admitting: Internal Medicine

## 2017-05-16 ENCOUNTER — Other Ambulatory Visit (HOSPITAL_COMMUNITY): Payer: Self-pay | Admitting: Internal Medicine

## 2017-05-16 DIAGNOSIS — M47896 Other spondylosis, lumbar region: Secondary | ICD-10-CM | POA: Insufficient documentation

## 2017-05-16 DIAGNOSIS — M25552 Pain in left hip: Secondary | ICD-10-CM | POA: Insufficient documentation

## 2017-06-12 ENCOUNTER — Encounter: Payer: Self-pay | Admitting: Orthopaedic Surgery

## 2017-06-12 ENCOUNTER — Ambulatory Visit (INDEPENDENT_AMBULATORY_CARE_PROVIDER_SITE_OTHER): Payer: Medicare HMO

## 2017-06-12 ENCOUNTER — Ambulatory Visit: Payer: Medicare HMO | Admitting: Orthopaedic Surgery

## 2017-06-12 VITALS — BP 154/64 | HR 80 | Ht 66.0 in | Wt 134.0 lb

## 2017-06-12 DIAGNOSIS — G8929 Other chronic pain: Secondary | ICD-10-CM | POA: Diagnosis not present

## 2017-06-12 DIAGNOSIS — M5442 Lumbago with sciatica, left side: Secondary | ICD-10-CM

## 2017-06-12 MED ORDER — PREDNISONE 5 MG (21) PO TBPK: ORAL_TABLET | ORAL | 0 refills | Status: DC

## 2017-06-12 NOTE — Patient Instructions (Signed)
Steps to Quit Smoking Smoking tobacco can be bad for your health. It can also affect almost every organ in your body. Smoking puts you and people around you at risk for many serious long-lasting (chronic) diseases. Quitting smoking is hard, but it is one of the best things that you can do for your health. It is never too late to quit. What are the benefits of quitting smoking? When you quit smoking, you lower your risk for getting serious diseases and conditions. They can include:  Lung cancer or lung disease.  Heart disease.  Stroke.  Heart attack.  Not being able to have children (infertility).  Weak bones (osteoporosis) and broken bones (fractures).  If you have coughing, wheezing, and shortness of breath, those symptoms may get better when you quit. You may also get sick less often. If you are pregnant, quitting smoking can help to lower your chances of having a baby of low birth weight. What can I do to help me quit smoking? Talk with your doctor about what can help you quit smoking. Some things you can do (strategies) include:  Quitting smoking totally, instead of slowly cutting back how much you smoke over a period of time.  Going to in-person counseling. You are more likely to quit if you go to many counseling sessions.  Using resources and support systems, such as: ? Online chats with a counselor. ? Phone quitlines. ? Printed self-help materials. ? Support groups or group counseling. ? Text messaging programs. ? Mobile phone apps or applications.  Taking medicines. Some of these medicines may have nicotine in them. If you are pregnant or breastfeeding, do not take any medicines to quit smoking unless your doctor says it is okay. Talk with your doctor about counseling or other things that can help you.  Talk with your doctor about using more than one strategy at the same time, such as taking medicines while you are also going to in-person counseling. This can help make  quitting easier. What things can I do to make it easier to quit? Quitting smoking might feel very hard at first, but there is a lot that you can do to make it easier. Take these steps:  Talk to your family and friends. Ask them to support and encourage you.  Call phone quitlines, reach out to support groups, or work with a counselor.  Ask people who smoke to not smoke around you.  Avoid places that make you want (trigger) to smoke, such as: ? Bars. ? Parties. ? Smoke-break areas at work.  Spend time with people who do not smoke.  Lower the stress in your life. Stress can make you want to smoke. Try these things to help your stress: ? Getting regular exercise. ? Deep-breathing exercises. ? Yoga. ? Meditating. ? Doing a body scan. To do this, close your eyes, focus on one area of your body at a time from head to toe, and notice which parts of your body are tense. Try to relax the muscles in those areas.  Download or buy apps on your mobile phone or tablet that can help you stick to your quit plan. There are many free apps, such as QuitGuide from the CDC (Centers for Disease Control and Prevention). You can find more support from smokefree.gov and other websites.  This information is not intended to replace advice given to you by your health care provider. Make sure you discuss any questions you have with your health care provider. Document Released: 05/13/2009 Document   Revised: 03/14/2016 Document Reviewed: 12/01/2014 Elsevier Interactive Patient Education  2018 Elsevier Inc.  

## 2017-06-12 NOTE — Progress Notes (Signed)
Subjective:    Patient ID: [name], female    DOB: Jun 07, 1941, 76 y.o.   MRN: [id number]  HPI She has had left hip pain for over three months.  She has no trauma.  She has pain of the left lower back that is localized.  She has no swelling but has pain that runs down the left buttocks to the knee.  She has no weakness, no swelling.  She has seen Dr. Legrand Rams for this and I have reviewed his notes. She had negative X-rays of the left hip done 05-16-17 and I have reviewed them.  She has tried Advil with slight help.  She does not like to take any medicine.  She has used heat, ice with no help.   Review of Systems  HENT: Negative for congestion.   Respiratory: Negative for cough and shortness of breath.   Cardiovascular: Negative for chest pain and leg swelling.  Endocrine: Positive for cold intolerance.  Musculoskeletal: Positive for arthralgias and back pain.  Allergic/Immunologic: Positive for environmental allergies.   Past Medical History:  Diagnosis Date  . Breast CA ([place]) 01/16/2011  . Breast cancer ([place])    rt dx 2010; mastectomy only  . Embolism - blood clot    left lung  . Hypertension   . Infiltrating ductal carcinoma of breast ([place]) 01/16/2011   Started Arimidex on 02/24/2009. Stage IB (T1b N0 M0) grade 1 infiltrating ductal carcinoma of the right breast, status post right modified radical mastectomy on 01/22/2009 for a 10 mm cancer, ER +100%, PR +98%, KI-67 marker was 9%, HER-2 negative, no LVI identified, and 11 lymph nodes were negative, all margins were clear. She had some associated DCIS, is now on Arimidex and she will take that for  . Tendonitis 07/2010   shot of cortisone    Past Surgical History:  Procedure Laterality Date  . ABDOMINAL HYSTERECTOMY  1987  . APPENDECTOMY  1974   blood clot in lung  . BREAST BIOPSY  1968   left  . MASTECTOMY MODIFIED RADICAL  01/22/2009   rt  . NASAL SINUS SURGERY  2002  . TEAR DUCT PROBING    . tubal ligation  1972     Current Outpatient Medications on File Prior to Visit  Medication Sig Dispense Refill  . albuterol (PROVENTIL HFA;VENTOLIN HFA) 108 (90 Base) MCG/ACT inhaler Inhale 1 puff into the lungs every 6 (six) hours as needed for wheezing or shortness of breath.    Marland Kitchen amLODipine (NORVASC) 5 MG tablet Take 5 mg by mouth daily.    Marland Kitchen aspirin 325 MG tablet Take 1 tablet (325 mg total) by mouth daily. 30 tablet 3  . Calcium Carb-Cholecalciferol (CALCIUM + D3 PO) Take 1 capsule by mouth 2 (two) times daily.     . fluticasone (FLONASE) 50 MCG/ACT nasal spray     . gabapentin (NEURONTIN) 300 MG capsule Take 300 mg by mouth at bedtime.     . lovastatin (MEVACOR) 40 MG tablet Take 40 mg by mouth at bedtime.     . metFORMIN (GLUCOPHAGE) 500 MG tablet Take 500 mg by mouth 2 (two) times daily.     . metoprolol tartrate (LOPRESSOR) 25 MG tablet Take 25 mg by mouth daily.     . naphazoline-glycerin (CLEAR EYES) 0.012-0.2 % SOLN Place 2 drops into both eyes daily as needed for irritation.    Marland Kitchen omeprazole (PRILOSEC) 20 MG capsule Take 1 capsule by mouth daily.    Marland Kitchen tiotropium (SPIRIVA) 18  MCG inhalation capsule Place 18 mcg into inhaler and inhale daily as needed.    . TRADJENTA 5 MG TABS tablet Take 5 mg by mouth daily.      No current facility-administered medications on file prior to visit.     Social History   Socioeconomic History  . Marital status: Divorced    Spouse name: Not on file  . Number of children: Not on file  . Years of education: Not on file  . Highest education level: Not on file  Social Needs  . Financial resource strain: Not on file  . Food insecurity - worry: Not on file  . Food insecurity - inability: Not on file  . Transportation needs - medical: Not on file  . Transportation needs - non-medical: Not on file  Occupational History  . Not on file  Tobacco Use  . Smoking status: Current Every Day Smoker    Packs/day: 0.50    Types: Cigarettes  . Smokeless tobacco: Never Used   Substance and Sexual Activity  . Alcohol use: No  . Drug use: Not on file  . Sexual activity: Not on file  Other Topics Concern  . Not on file  Social History Narrative  . Not on file    Family History  Problem Relation Age of Onset  . Breast cancer Sister   . Breast cancer Maternal Grandmother   . Colon cancer Daughter     BP (!) 154/64   Pulse 80   Ht _0  (1.676 m)   Wt 134 lb (60.8 kg)   BMI 21.63 kg/m      Objective:   Physical Exam  Constitutional: She is oriented to person, place, and time. She appears well-developed and well-nourished.  HENT:  Head: Normocephalic and atraumatic.  Eyes: Conjunctivae and EOM are normal. Pupils are equal, round, and reactive to light.  Neck: Normal range of motion. Neck supple.  Cardiovascular: Normal rate, regular rhythm and intact distal pulses.  Pulmonary/Chest: Effort normal.  Abdominal: Soft.  Musculoskeletal: She exhibits tenderness (She has pain of the left lower back, no spasm, ROM of the left hip is full, gait normal, reflexes normal, SLR normal, no weakness.  ROM back good.).  Neurological: She is alert and oriented to person, place, and time. She displays normal reflexes. No cranial nerve deficit. She exhibits normal muscle tone. Coordination normal.  Skin: Skin is warm and dry.  Psychiatric: She has a normal mood and affect. Her behavior is normal. Judgment and thought content normal.  Vitals reviewed.  X-rays were done of the lumbar spine, reported separately.       Assessment & Plan:   Encounter Diagnosis  Name Primary?  . Chronic left-sided low back pain with left-sided sciatica Yes   I told her she has lower back pain not hip pain.    I will begin prednisone dose pack.  Return in one week.  Call if any problem.  Precautions discussed.   Electronically Signed Sanjuana Kava, MD 11/13/20182:19 PM

## 2017-06-19 ENCOUNTER — Ambulatory Visit: Payer: Medicare HMO | Admitting: Orthopaedic Surgery

## 2017-06-19 ENCOUNTER — Encounter: Payer: Self-pay | Admitting: Orthopaedic Surgery

## 2017-06-19 VITALS — BP 133/72 | HR 78 | Temp 98.7°F | Ht 66.0 in | Wt 135.0 lb

## 2017-06-19 DIAGNOSIS — G8929 Other chronic pain: Secondary | ICD-10-CM | POA: Diagnosis not present

## 2017-06-19 DIAGNOSIS — M5442 Lumbago with sciatica, left side: Secondary | ICD-10-CM

## 2017-06-19 NOTE — Patient Instructions (Signed)
Steps to Quit Smoking Smoking tobacco can be bad for your health. It can also affect almost every organ in your body. Smoking puts you and people around you at risk for many serious Brandt Chaney-lasting (chronic) diseases. Quitting smoking is hard, but it is one of the best things that you can do for your health. It is never too late to quit. What are the benefits of quitting smoking? When you quit smoking, you lower your risk for getting serious diseases and conditions. They can include:  Lung cancer or lung disease.  Heart disease.  Stroke.  Heart attack.  Not being able to have children (infertility).  Weak bones (osteoporosis) and broken bones (fractures).  If you have coughing, wheezing, and shortness of breath, those symptoms may get better when you quit. You may also get sick less often. If you are pregnant, quitting smoking can help to lower your chances of having a baby of low birth weight. What can I do to help me quit smoking? Talk with your doctor about what can help you quit smoking. Some things you can do (strategies) include:  Quitting smoking totally, instead of slowly cutting back how much you smoke over a period of time.  Going to in-person counseling. You are more likely to quit if you go to many counseling sessions.  Using resources and support systems, such as: ? Online chats with a counselor. ? Phone quitlines. ? Printed self-help materials. ? Support groups or group counseling. ? Text messaging programs. ? Mobile phone apps or applications.  Taking medicines. Some of these medicines may have nicotine in them. If you are pregnant or breastfeeding, do not take any medicines to quit smoking unless your doctor says it is okay. Talk with your doctor about counseling or other things that can help you.  Talk with your doctor about using more than one strategy at the same time, such as taking medicines while you are also going to in-person counseling. This can help make  quitting easier. What things can I do to make it easier to quit? Quitting smoking might feel very hard at first, but there is a lot that you can do to make it easier. Take these steps:  Talk to your family and friends. Ask them to support and encourage you.  Call phone quitlines, reach out to support groups, or work with a counselor.  Ask people who smoke to not smoke around you.  Avoid places that make you want (trigger) to smoke, such as: ? Bars. ? Parties. ? Smoke-break areas at work.  Spend time with people who do not smoke.  Lower the stress in your life. Stress can make you want to smoke. Try these things to help your stress: ? Getting regular exercise. ? Deep-breathing exercises. ? Yoga. ? Meditating. ? Doing a body scan. To do this, close your eyes, focus on one area of your body at a time from head to toe, and notice which parts of your body are tense. Try to relax the muscles in those areas.  Download or buy apps on your mobile phone or tablet that can help you stick to your quit plan. There are many free apps, such as QuitGuide from the CDC (Centers for Disease Control and Prevention). You can find more support from smokefree.gov and other websites.  This information is not intended to replace advice given to you by your health care provider. Make sure you discuss any questions you have with your health care provider. Document Released: 05/13/2009 Document   Revised: 03/14/2016 Document Reviewed: 12/01/2014 Elsevier Interactive Patient Education  2018 Elsevier Inc.  

## 2017-06-19 NOTE — Progress Notes (Signed)
Patient [name], female DOB:[birth year], 76 y.o. SWH:[id number]  Chief Complaint  Patient presents with  . Follow-up    BACK PAIN    HPI  [name] is a 76 y.o. female who has lower back pain. She was given a prednisone dose pack last week.  She is significantly improved. She has no pain. She raked leaves two days ago and did OK and has no increased pain.  She has no paresthesias.  She says she is a new person.  I have gone over precautions with her and not to overdo it.  I have given samples of Aleve, one bid pc.  I will see her in one month.  Call if any problem. HPI  Body mass index is 21.79 kg/m.  ROS  Review of Systems  HENT: Negative for congestion.   Respiratory: Negative for cough and shortness of breath.   Cardiovascular: Negative for chest pain and leg swelling.  Endocrine: Positive for cold intolerance.  Musculoskeletal: Positive for arthralgias and back pain.  Allergic/Immunologic: Positive for environmental allergies.    Past Medical History:  Diagnosis Date  . Breast CA ([place]) 01/16/2011  . Breast cancer ([place])    rt dx 2010; mastectomy only  . Embolism - blood clot    left lung  . Hypertension   . Infiltrating ductal carcinoma of breast ([place]) 01/16/2011   Started Arimidex on 02/24/2009. Stage IB (T1b N0 M0) grade 1 infiltrating ductal carcinoma of the right breast, status post right modified radical mastectomy on 01/22/2009 for a 10 mm cancer, ER +100%, PR +98%, KI-67 marker was 9%, HER-2 negative, no LVI identified, and 11 lymph nodes were negative, all margins were clear. She had some associated DCIS, is now on Arimidex and she will take that for  . Tendonitis 07/2010   shot of cortisone    Past Surgical History:  Procedure Laterality Date  . ABDOMINAL HYSTERECTOMY  1987  . APPENDECTOMY  1974   blood clot in lung  . BREAST BIOPSY  1968   left  . MASTECTOMY MODIFIED RADICAL  01/22/2009   rt  . NASAL SINUS SURGERY  2002  . TEAR DUCT PROBING    .  tubal ligation  1972  . YAG LASER APPLICATION Right 6/65/9935   Performed by Williams Che, MD at [facility]    Family History  Problem Relation Age of Onset  . Breast cancer Sister   . Breast cancer Maternal Grandmother   . Colon cancer Daughter     Social History Social History   Tobacco Use  . Smoking status: Current Every Day Smoker    Packs/day: 0.50    Types: Cigarettes  . Smokeless tobacco: Never Used  Substance Use Topics  . Alcohol use: No  . Drug use: Not on file    Allergies  Allergen Reactions  . Pollen Extract Other (See Comments)    Runny nose, watery eyes and ears get stopped up    Current Outpatient Medications  Medication Sig Dispense Refill  . albuterol (PROVENTIL HFA;VENTOLIN HFA) 108 (90 Base) MCG/ACT inhaler Inhale 1 puff into the lungs every 6 (six) hours as needed for wheezing or shortness of breath.    Marland Kitchen amLODipine (NORVASC) 5 MG tablet Take 5 mg by mouth daily.    Marland Kitchen aspirin 325 MG tablet Take 1 tablet (325 mg total) by mouth daily. 30 tablet 3  . Calcium Carb-Cholecalciferol (CALCIUM + D3 PO) Take 1 capsule by mouth 2 (two) times daily.     Marland Kitchen  fluticasone (FLONASE) 50 MCG/ACT nasal spray     . gabapentin (NEURONTIN) 300 MG capsule Take 300 mg by mouth at bedtime.     . lovastatin (MEVACOR) 40 MG tablet Take 40 mg by mouth at bedtime.     . metFORMIN (GLUCOPHAGE) 500 MG tablet Take 500 mg by mouth 2 (two) times daily.     . metoprolol tartrate (LOPRESSOR) 25 MG tablet Take 25 mg by mouth daily.     . naphazoline-glycerin (CLEAR EYES) 0.012-0.2 % SOLN Place 2 drops into both eyes daily as needed for irritation.    Marland Kitchen omeprazole (PRILOSEC) 20 MG capsule Take 1 capsule by mouth daily.    . predniSONE (STERAPRED UNI-PAK 21 TAB) 5 MG (21) TBPK tablet Take 6 pills first day; 5 pills second day; 4 pills third day; 3 pills fourth day; 2 pills next day and 1 pill last day. 21 tablet 0  . tiotropium (SPIRIVA) 18 MCG inhalation capsule Place 18 mcg into inhaler  and inhale daily as needed.    . TRADJENTA 5 MG TABS tablet Take 5 mg by mouth daily.      No current facility-administered medications for this visit.      Physical Exam  Blood pressure 133/72, pulse 78, temperature 98.7 F (37.1 C), height 5' 6"  (1.676 m), weight 135 lb (61.2 kg).  Constitutional: overall normal hygiene, normal nutrition, well developed, normal grooming, normal body habitus. Assistive device:none  Musculoskeletal: gait and station Limp none, muscle tone and strength are normal, no tremors or atrophy is present.  .  Neurological: coordination overall normal.  Deep tendon reflex/nerve stretch intact.  Sensation normal.  Cranial nerves II-XII intact.   Skin:   Normal overall no scars, lesions, ulcers or rashes. No psoriasis.  Psychiatric: Alert and oriented x 3.  Recent memory intact, remote memory unclear.  Normal mood and affect. Well groomed.  Good eye contact.  Cardiovascular: overall no swelling, no varicosities, no edema bilaterally, normal temperatures of the legs and arms, no clubbing, cyanosis and good capillary refill.  Lymphatic: palpation is normal.  All other systems reviewed and are negative   Spine/Pelvis examination:  Inspection:  Overall, sacoiliac joint benign and hips nontender; without crepitus or defects.   Thoracic spine inspection: Alignment normal without kyphosis present   Lumbar spine inspection:  Alignment  with normal lumbar lordosis, without scoliosis apparent.   Thoracic spine palpation:  without tenderness of spinal processes   Lumbar spine palpation: without tenderness of lumbar area; without tightness of lumbar muscles    Range of Motion:   Lumbar flexion, forward flexion is normal without pain or tenderness    Lumbar extension is full without pain or tenderness   Left lateral bend is normal without pain or tenderness   Right lateral bend is normal without pain or tenderness   Straight leg raising is normal  Strength &  tone: normal   Stability overall normal stability The patient has been educated about the nature of the problem(s) and counseled on treatment options.  The patient appeared to understand what I have discussed and is in agreement with it.  Encounter Diagnosis  Name Primary?  . Chronic left-sided low back pain with left-sided sciatica Yes    PLAN Call if any problems.  Precautions discussed.  Continue current medications.   Return to clinic 1 month   Electronically Signed Sanjuana Kava, MD 11/20/201811:17 AM

## 2017-07-17 ENCOUNTER — Ambulatory Visit: Payer: Medicare HMO | Admitting: Orthopaedic Surgery

## 2017-07-31 ENCOUNTER — Encounter (HOSPITAL_COMMUNITY): Payer: Self-pay

## 2017-07-31 ENCOUNTER — Emergency Department (HOSPITAL_COMMUNITY)
Admission: EM | Admit: 2017-07-31 | Discharge: 2017-07-31 | Disposition: A | Discharge status: Home / Self Care | Payer: Medicare HMO | Attending: Emergency Medicine | Admitting: Emergency Medicine

## 2017-07-31 DIAGNOSIS — Z79899 Other long term (current) drug therapy: Secondary | ICD-10-CM | POA: Diagnosis not present

## 2017-07-31 DIAGNOSIS — I10 Essential (primary) hypertension: Secondary | ICD-10-CM | POA: Diagnosis not present

## 2017-07-31 DIAGNOSIS — N8110 Cystocele, unspecified: Secondary | ICD-10-CM | POA: Insufficient documentation

## 2017-07-31 DIAGNOSIS — N3000 Acute cystitis without hematuria: Secondary | ICD-10-CM | POA: Diagnosis not present

## 2017-07-31 DIAGNOSIS — E119 Type 2 diabetes mellitus without complications: Secondary | ICD-10-CM | POA: Diagnosis not present

## 2017-07-31 DIAGNOSIS — Z7984 Long term (current) use of oral hypoglycemic drugs: Secondary | ICD-10-CM | POA: Insufficient documentation

## 2017-07-31 DIAGNOSIS — R102 Pelvic and perineal pain: Secondary | ICD-10-CM | POA: Diagnosis present

## 2017-07-31 DIAGNOSIS — N812 Incomplete uterovaginal prolapse: Secondary | ICD-10-CM | POA: Diagnosis not present

## 2017-07-31 LAB — URINALYSIS, ROUTINE W REFLEX MICROSCOPIC
BILIRUBIN URINE: NEGATIVE
Glucose, UA: NEGATIVE mg/dL
Ketones, ur: NEGATIVE mg/dL
NITRITE: POSITIVE — AB
PH: 6 (ref 5.0–8.0)
Protein, ur: 100 mg/dL — AB
SPECIFIC GRAVITY, URINE: 1.01 (ref 1.005–1.030)

## 2017-07-31 MED ORDER — PHENAZOPYRIDINE HCL 200 MG PO TABS: 200.0000 mg | ORAL_TABLET | Freq: Three times a day (TID) | ORAL | 0 refills | Status: DC

## 2017-07-31 MED ORDER — CEPHALEXIN 500 MG PO CAPS
500.0000 mg | ORAL_CAPSULE | Freq: Once | ORAL | Status: AC
  Administered 2017-07-31: 500 mg via ORAL
  Filled 2017-07-31: qty 1

## 2017-07-31 MED ORDER — CEPHALEXIN 500 MG PO CAPS: 500.0000 mg | ORAL_CAPSULE | Freq: Four times a day (QID) | ORAL | 0 refills | Status: DC

## 2017-07-31 MED ORDER — PHENAZOPYRIDINE HCL 100 MG PO TABS
200.0000 mg | ORAL_TABLET | Freq: Once | ORAL | Status: AC
  Administered 2017-07-31: 200 mg via ORAL
  Filled 2017-07-31: qty 2

## 2017-07-31 NOTE — ED Provider Notes (Signed)
[facility] EMERGENCY DEPARTMENT Provider Note   CSN: [id number] Arrival date & time: 07/31/17  7026     History   Chief Complaint Chief Complaint  Patient presents with  . pelvic pressure    HPI [name] is a 77 y.o. female.  Pt presents to the ED today with pelvic pressure and uti.  Sx started on Friday the 12/28.  The pt denies fever.  She does describe a bulge into her vagina that she can push back in and goes back with lying back.  She has not had this in the past.      Past Medical History:  Diagnosis Date  . Breast CA ([place]) 01/16/2011  . Breast cancer ([place])    rt dx 2010; mastectomy only  . Embolism - blood clot    left lung  . Hypertension   . Infiltrating ductal carcinoma of breast ([place]) 01/16/2011   Started Arimidex on 02/24/2009. Stage IB (T1b N0 M0) grade 1 infiltrating ductal carcinoma of the right breast, status post right modified radical mastectomy on 01/22/2009 for a 10 mm cancer, ER +100%, PR +98%, KI-67 marker was 9%, HER-2 negative, no LVI identified, and 11 lymph nodes were negative, all margins were clear. She had some associated DCIS, is now on Arimidex and she will take that for  . Tendonitis 07/2010   shot of cortisone    Patient Active Problem List   Diagnosis Date Noted  . Stroke (cerebrum) ([place]) 03/22/2016  . Tobacco use disorder 03/22/2016  . Diabetes mellitus type 2 in nonobese ([place]) 03/22/2016  . Benign essential HTN 03/22/2016  . Hyperlipidemia 03/22/2016  . Acute left-sided weakness 03/22/2016  . Osteopenia 01/25/2014  . Embolism - blood clot   . Infiltrating ductal carcinoma of breast ([place]) 01/16/2011  . Tendonitis   . DEQUERVAIN'S 08/10/2010    Past Surgical History:  Procedure Laterality Date  . ABDOMINAL HYSTERECTOMY  1987  . APPENDECTOMY  1974   blood clot in lung  . BREAST BIOPSY  1968   left  . MASTECTOMY MODIFIED RADICAL  01/22/2009   rt  . NASAL SINUS SURGERY  2002  . TEAR DUCT PROBING    . tubal ligation  1972    . YAG LASER APPLICATION Right 3/78/5885   Procedure: YAG LASER APPLICATION;  Surgeon: Williams Che, MD;  Location: [facility];  Service: Ophthalmology;  Laterality: Right;    OB History    No data available       Home Medications    Prior to Admission medications   Medication Sig Start Date End Date Taking? Authorizing Provider  albuterol (PROVENTIL HFA;VENTOLIN HFA) 108 (90 Base) MCG/ACT inhaler Inhale 1 puff into the lungs every 6 (six) hours as needed for wheezing or shortness of breath.    [provider]  amLODipine (NORVASC) 5 MG tablet Take 5 mg by mouth daily.    [provider]  aspirin 325 MG tablet Take 1 tablet (325 mg total) by mouth daily. 03/24/16   Rosita Fire, MD  Calcium Carb-Cholecalciferol (CALCIUM + D3 PO) Take 1 capsule by mouth 2 (two) times daily.     [provider]  cephALEXin (KEFLEX) 500 MG capsule Take 1 capsule (500 mg total) by mouth 4 (four) times daily. 07/31/17   Isla Pence, MD  fluticasone Asencion Islam) 50 MCG/ACT nasal spray  01/12/16   [provider]  gabapentin (NEURONTIN) 300 MG capsule Take 300 mg by mouth at bedtime.  02/12/16   [provider]  lovastatin (MEVACOR) 40 MG tablet Take 40 mg by mouth at bedtime.     [provider]  metFORMIN (GLUCOPHAGE) 500 MG tablet Take 500 mg by mouth 2 (two) times daily.  02/12/16   [provider]  metoprolol tartrate (LOPRESSOR) 25 MG tablet Take 25 mg by mouth daily.     [provider]  naphazoline-glycerin (CLEAR EYES) 0.012-0.2 % SOLN Place 2 drops into both eyes daily as needed for irritation.    [provider]  omeprazole (PRILOSEC) 20 MG capsule Take 1 capsule by mouth daily. 06/28/13   [provider]  phenazopyridine (PYRIDIUM) 200 MG tablet Take 1 tablet (200 mg total) by mouth 3 (three) times daily. 07/31/17   Isla Pence, MD  predniSONE (STERAPRED UNI-PAK 21 TAB) 5 MG (21) TBPK tablet Take 6 pills first  day; 5 pills second day; 4 pills third day; 3 pills fourth day; 2 pills next day and 1 pill last day. 06/12/17   Sanjuana Kava, MD  tiotropium (SPIRIVA) 18 MCG inhalation capsule Place 18 mcg into inhaler and inhale daily as needed.    [provider]  TRADJENTA 5 MG TABS tablet Take 5 mg by mouth daily.  02/12/16   [provider]    Family History Family History  Problem Relation Age of Onset  . Breast cancer Sister   . Breast cancer Maternal Grandmother   . Colon cancer Daughter     Social History Social History   Tobacco Use  . Smoking status: Current Every Day Smoker    Packs/day: 0.50    Types: Cigarettes  . Smokeless tobacco: Never Used  Substance Use Topics  . Alcohol use: No  . Drug use: Not on file     Allergies   Pollen extract   Review of Systems Review of Systems  Genitourinary: Positive for dysuria.  All other systems reviewed and are negative.    Physical Exam Updated Vital Signs BP (!) 174/81 (BP Location: Left Arm)   Pulse 76   Temp 98.1 F (36.7 C) (Oral)   Resp 18   Ht 5' 5"  (1.651 m)   Wt 61.2 kg (135 lb)   SpO2 97%   BMI 22.47 kg/m   Physical Exam  Constitutional: She is oriented to person, place, and time. She appears well-developed and well-nourished.  HENT:  Head: Normocephalic and atraumatic.  Right Ear: External ear normal.  Left Ear: External ear normal.  Nose: Nose normal.  Mouth/Throat: Oropharynx is clear and moist.  Eyes: Conjunctivae and EOM are normal. Pupils are equal, round, and reactive to light.  Neck: Normal range of motion. Neck supple.  Cardiovascular: Normal rate, regular rhythm, normal heart sounds and intact distal pulses.  Pulmonary/Chest: Effort normal and breath sounds normal.  Abdominal: Soft. Normal appearance. There is tenderness in the suprapubic area.  Musculoskeletal: Normal range of motion.  Neurological: She is alert and oriented to person, place, and time.  Skin: Skin is warm and  dry. Capillary refill takes less than 2 seconds.  Psychiatric: She has a normal mood and affect. Her behavior is normal. Judgment and thought content normal.  Nursing note and vitals reviewed.    ED Treatments / Results  Labs (all labs ordered are listed, but only abnormal results are displayed) Labs Reviewed  URINALYSIS, ROUTINE W REFLEX MICROSCOPIC - Abnormal; Notable for the following components:      Result Value   Color, Urine AMBER (*)    APPearance TURBID (*)  Hgb urine dipstick SMALL (*)    Protein, ur 100 (*)    Nitrite POSITIVE (*)    Leukocytes, UA MODERATE (*)    Bacteria, UA RARE (*)    Squamous Epithelial / LPF 0-5 (*)    Non Squamous Epithelial 0-5 (*)    All other components within normal limits  URINE CULTURE    EKG  EKG Interpretation None       Radiology No results found.  Procedures Procedures (including critical care time)  Medications Ordered in ED Medications  cephALEXin (KEFLEX) capsule 500 mg (not administered)  phenazopyridine (PYRIDIUM) tablet 200 mg (not administered)     Initial Impression / Assessment and Plan / ED Course  I have reviewed the triage vital signs and the nursing notes.  Pertinent labs & imaging results that were available during my care of the patient were reviewed by me and considered in my medical decision making (see chart for details).    Pt describes bladder prolapse which resolves with lying back.  She does not have a gyn, so is instructed to f/u with Dr. Glo Herring or Elonda Husky.  She will be treated for UTI with keflex (1st dose here) and culture will be sent.  Final Clinical Impressions(s) / ED Diagnoses   Final diagnoses:  Acute cystitis without hematuria  Bladder prolapse, female, acquired    ED Discharge Orders        Ordered    cephALEXin (KEFLEX) 500 MG capsule  4 times daily     07/31/17 1016    phenazopyridine (PYRIDIUM) 200 MG tablet  3 times daily     07/31/17 1016       Isla Pence,  MD 07/31/17 1019

## 2017-07-31 NOTE — ED Triage Notes (Signed)
Pt reports started feeling like bladder has prolapsed Friday.  C/O pressure in vagina.

## 2017-08-02 LAB — URINE CULTURE: Culture: >=100000 — AB

## 2017-08-03 ENCOUNTER — Telehealth: Payer: Self-pay | Admitting: *Deleted

## 2017-08-03 NOTE — Telephone Encounter (Signed)
Post ED Visit - Positive Culture Follow-up  Culture report reviewed by antimicrobial stewardship pharmacist:  []  Elenor Quinones, Pharm.D. []  Heide Guile, Pharm.D., BCPS AQ-ID [x]  Parks Neptune, Pharm.D., BCPS []  Alycia Rossetti, Pharm.D., BCPS []  [place], Pharm.D., BCPS, AAHIVP []  Legrand Como, Pharm.D., BCPS, AAHIVP []  Salome Arnt, PharmD, BCPS []  Jalene Mullet, PharmD []  Vincenza Hews, PharmD, BCPS  Positive urine culture Treated with Cephalexin, organism sensitive to the same and no further patient follow-up is required at this time.  Harlon Flor [facility] 08/03/2017, 9:05 AM

## 2017-08-09 DIAGNOSIS — I10 Essential (primary) hypertension: Secondary | ICD-10-CM | POA: Diagnosis not present

## 2017-08-09 DIAGNOSIS — J449 Chronic obstructive pulmonary disease, unspecified: Secondary | ICD-10-CM | POA: Diagnosis not present

## 2017-08-09 DIAGNOSIS — F419 Anxiety disorder, unspecified: Secondary | ICD-10-CM | POA: Diagnosis not present

## 2017-08-09 DIAGNOSIS — C50919 Malignant neoplasm of unspecified site of unspecified female breast: Secondary | ICD-10-CM | POA: Diagnosis not present

## 2017-08-10 ENCOUNTER — Encounter: Payer: Self-pay | Admitting: Obstetrics & Gynecology

## 2017-08-30 ENCOUNTER — Encounter: Payer: Self-pay | Admitting: Obstetrics and Gynecology

## 2017-08-30 ENCOUNTER — Ambulatory Visit: Payer: Medicare HMO | Admitting: Obstetrics and Gynecology

## 2017-08-30 VITALS — BP 168/80 | HR 84 | Ht 65.5 in | Wt 137.8 lb

## 2017-08-30 DIAGNOSIS — Z1212 Encounter for screening for malignant neoplasm of rectum: Secondary | ICD-10-CM | POA: Diagnosis not present

## 2017-08-30 DIAGNOSIS — N8110 Cystocele, unspecified: Secondary | ICD-10-CM | POA: Diagnosis not present

## 2017-08-30 DIAGNOSIS — N952 Postmenopausal atrophic vaginitis: Secondary | ICD-10-CM | POA: Diagnosis not present

## 2017-08-30 DIAGNOSIS — R102 Pelvic and perineal pain: Secondary | ICD-10-CM

## 2017-08-30 DIAGNOSIS — Z1211 Encounter for screening for malignant neoplasm of colon: Secondary | ICD-10-CM | POA: Diagnosis not present

## 2017-08-30 DIAGNOSIS — N3289 Other specified disorders of bladder: Secondary | ICD-10-CM

## 2017-08-30 DIAGNOSIS — Z853 Personal history of malignant neoplasm of breast: Secondary | ICD-10-CM

## 2017-08-30 LAB — HEMOCCULT GUIAC POC 1CARD (OFFICE): FECAL OCCULT BLD: NEGATIVE

## 2017-08-30 NOTE — Progress Notes (Signed)
[facility] Visit  08/30/2017            Patient name: [name] MRN [id number]  Date of birth: [birth year]  CC & HPI: Bladder Prolapse  [name] is a 77 y.o. female presenting today for bulge in vagina that she began to feel on 12/28. Patient states she can push it back in, and it also goes back in when she is laying down. She has not had this in the past, per chart review. She has associated symptoms of pelvic pressure and general discomfort, and bladder infections. She has not tried anything to resolve this issue. She was referred by Dr. Gilford Raid, and her PCMD, who she saw in the ED. She was seen in the ED on 07/31/17 for pelvic pressure and UTI that began on 12/28. She is not sexually active. She does not have any issues with her bowel movements.  She also notes that her right arm has been slightly swollen for the past 2 weeks. She has tried a compression sleeve but states it looks like the swelling increased. She has hx of breast cancer , is s/p right mastectomy and lymph node dissection x 9 yrs ago, and declines exam today when offered, plans to see [facility] MD, tubal ligation, and abdominal hysterectomy.  ROS:  ROS  (+) bladder bulge(+) pelvic pressure Chronic smoker x many years, still smokes, has some voice changes and ?COPD All systems are negative except as noted in the HPI and PMH.   Pertinent History Reviewed:   Reviewed: Significant for breast cancer, abdominal hysterectomy, tubal ligation Medical         Past Medical History:  Diagnosis Date  . Breast CA ([place]) 01/16/2011  . Breast cancer ([place])    rt dx 2010; mastectomy only  . Embolism - blood clot    left lung  . Hypertension   . Infiltrating ductal carcinoma of breast ([place]) 01/16/2011   Started Arimidex on 02/24/2009. Stage IB (T1b N0 M0) grade 1 infiltrating ductal carcinoma of the right breast, status post right modified radical mastectomy on 01/22/2009 for a 10 mm cancer, ER +100%, PR +98%,  KI-67 marker was 9%, HER-2 negative, no LVI identified, and 11 lymph nodes were negative, all margins were clear. She had some associated DCIS, is now on Arimidex and she will take that for  . Tendonitis 07/2010   shot of cortisone                              Surgical Hx:    Past Surgical History:  Procedure Laterality Date  . ABDOMINAL HYSTERECTOMY  1987  . APPENDECTOMY  1974   blood clot in lung  . BREAST BIOPSY  1968   left  . MASTECTOMY MODIFIED RADICAL  01/22/2009   rt  . NASAL SINUS SURGERY  2002  . TEAR DUCT PROBING    . tubal ligation  1972  . YAG LASER APPLICATION Right 8/46/9629   Procedure: YAG LASER APPLICATION;  Surgeon: Williams Che, MD;  Location: [facility];  Service: Ophthalmology;  Laterality: Right;   Medications: Reviewed & Updated - see associated section                       Current Outpatient Medications:  .  albuterol (PROVENTIL HFA;VENTOLIN HFA) 108 (90 Base) MCG/ACT inhaler, Inhale 1 puff into the lungs every 6 (six) hours as  needed for wheezing or shortness of breath., Disp: , Rfl:  .  amLODipine (NORVASC) 5 MG tablet, Take 5 mg by mouth daily., Disp: , Rfl:  .  aspirin 325 MG tablet, Take 1 tablet (325 mg total) by mouth daily., Disp: 30 tablet, Rfl: 3 .  Calcium Carbonate-Vitamin D (CALCIUM 500 + D PO), Take by mouth daily., Disp: , Rfl:  .  fluticasone (FLONASE) 50 MCG/ACT nasal spray, Place 2 sprays into both nostrils as needed. , Disp: , Rfl:  .  gabapentin (NEURONTIN) 300 MG capsule, Take 300 mg by mouth at bedtime. , Disp: , Rfl:  .  lovastatin (MEVACOR) 40 MG tablet, Take 40 mg by mouth at bedtime. , Disp: , Rfl:  .  metFORMIN (GLUCOPHAGE) 500 MG tablet, Take 500 mg by mouth 2 (two) times daily. , Disp: , Rfl:  .  metoprolol tartrate (LOPRESSOR) 25 MG tablet, Take 25 mg by mouth daily. , Disp: , Rfl:  .  naphazoline-glycerin (CLEAR EYES) 0.012-0.2 % SOLN, Place 2 drops into both eyes daily as needed for irritation., Disp: , Rfl:  .  omeprazole  (PRILOSEC) 20 MG capsule, Take 1 capsule by mouth daily., Disp: , Rfl:  .  tiotropium (SPIRIVA) 18 MCG inhalation capsule, Place 18 mcg into inhaler and inhale daily as needed., Disp: , Rfl:  .  TRADJENTA 5 MG TABS tablet, Take 5 mg by mouth daily. , Disp: , Rfl:    Social History: Reviewed -  reports that she has been smoking cigarettes.  She has been smoking about 0.50 packs per day. she has never used smokeless tobacco.  Objective Findings:  Vitals: Blood pressure (!) 168/80, pulse 84, height 5' 5.5" (1.664 m), weight 137 lb 12.8 oz (62.5 kg).  PHYSICAL EXAMINATION General appearance - alert, well appearing, and in no distress and oriented to person, place, and time Mental status - alert, oriented to person, place, and time, normal mood, behavior, speech, dress, motor activity, and thought processes Chest - Heart -  Abdomen - soft, nontender, nondistended, no masses or organomegaly Breasts - declines exam Skin - normal coloration and turgor, no rashes, no suspicious skin lesions noted Abd soft, without masses  PELVIC External genitalia - normal for age Vulva - normal Vagina - cystocele present to introitus, atrophic vaginal tissue, evidence of prior episiotomy (midline), good urethral support Cervix - surgically absent Uterus - surgically absent Adnexa -  [place] - not done Rectal - guaiac negative stool obtained Good rectal support at introitus, some laxity high in the rectum   Assessment & Plan:   A: Hx of infiltrating ductal carcinoma of breast, stage IA 1.  Grade 2 cystocele 2. Atrophic vaginal tissues 3. Discussion of cystocele management with pessaries, surgical option, poise bladder support  P:  1. Pt would benefit from preop estrogen in vagina, but will not Rx due to hx PE and E + Ca Breast cancer., and arm swelling that needs eval 2. F/u in 4 weeks (after resolving swollen arm) for discussion of anterior repair 3 pt to make appt asap for cancer center for eval  of right arm and chest.  By signing my name below, I, Izna Ahmed, attest that this documentation has been prepared under the direction and in the presence of Jonnie Kind, MD. Electronically Signed: Jabier Gauss, Medical Scribe. 08/30/17. 1:58 PM.  I personally performed the services described in this documentation, which was SCRIBED in my presence. The recorded information has been reviewed and considered accurate. It has  been edited as necessary during review. Jonnie Kind, MD

## 2017-09-03 ENCOUNTER — Encounter (HOSPITAL_COMMUNITY): Payer: Self-pay | Admitting: *Deleted

## 2017-09-03 DIAGNOSIS — M7989 Other specified soft tissue disorders: Secondary | ICD-10-CM

## 2017-09-03 NOTE — Progress Notes (Signed)
Patient called the cancer center to inquire about swelling in her right arm. She states that she is 10 years post partial mastectomy and has had swelling in her right arm before.  Starting 1 week ago she began to have swelling in her right arm that involved the entire arm.  She wore her sleeve and the swelling is now mainly from her wrist to elbow.  Patient denies any pain, redness or warmth to the area.  She recently had an appointment with her OBGYN and they referred her swelling back to [place].    I spoke with Rulon Abide, NP: orders received for [place] of right upper extremity to rule out DVT.    Orders placed for [place] right upper extremity and appointments have been made.  Patient is aware and verbalizes understanding and agreement to treatment.

## 2017-09-06 ENCOUNTER — Ambulatory Visit (HOSPITAL_COMMUNITY)
Admission: RE | Admit: 2017-09-06 | Discharge: 2017-09-06 | Disposition: A | Discharge status: Home / Self Care | Payer: Medicare HMO | Source: Ambulatory Visit | Attending: Oncology | Admitting: Oncology

## 2017-09-06 DIAGNOSIS — M7989 Other specified soft tissue disorders: Secondary | ICD-10-CM | POA: Diagnosis not present

## 2017-09-06 DIAGNOSIS — R600 Localized edema: Secondary | ICD-10-CM | POA: Diagnosis not present

## 2017-09-27 ENCOUNTER — Ambulatory Visit: Payer: Medicare HMO | Admitting: Obstetrics and Gynecology

## 2017-09-27 ENCOUNTER — Encounter: Payer: Self-pay | Admitting: Obstetrics and Gynecology

## 2017-09-27 VITALS — BP 168/78 | HR 83 | Ht 65.0 in | Wt 138.2 lb

## 2017-09-27 DIAGNOSIS — N8111 Cystocele, midline: Secondary | ICD-10-CM

## 2017-09-27 DIAGNOSIS — Z4689 Encounter for fitting and adjustment of other specified devices: Secondary | ICD-10-CM | POA: Diagnosis not present

## 2017-09-28 ENCOUNTER — Ambulatory Visit: Payer: Medicare HMO | Admitting: Obstetrics and Gynecology

## 2017-09-28 ENCOUNTER — Encounter: Payer: Self-pay | Admitting: Obstetrics and Gynecology

## 2017-09-28 VITALS — BP 160/100 | HR 90 | Ht 65.0 in | Wt 137.4 lb

## 2017-09-28 DIAGNOSIS — Z4689 Encounter for fitting and adjustment of other specified devices: Secondary | ICD-10-CM | POA: Diagnosis not present

## 2017-09-28 DIAGNOSIS — N8110 Cystocele, unspecified: Secondary | ICD-10-CM | POA: Diagnosis not present

## 2017-09-28 NOTE — Progress Notes (Signed)
Patient ID: Raquelle S Waymire, female   DOB: 02/02/1941, 77 y.o.   MRN: [id number]   [facility] Visit  @DATE@            Patient name: [name] MRN [id number]  Date of birth: 07/02/1941  CC & HPI:  [name] is a 77 y.o. female presenting today for a follow-up of a pessary that was placed yesterday. She was originally seen yesterday, 09/27/2017 for vulvar discomfort and pelvic relaxation. She states that her symptoms have improved, but she does state that the pessary is somewhat uncomfortable. She is willing to try the pessary for a longer time in hopes of avoiding surgery will reduce to a 1.75 pessary the patient denies fever, chills or any other symptoms or complaints at this time.   ROS:  ROS +pelvic relaxation -fever -chills All systems are negative except as noted in the HPI and PMH.   Pertinent History Reviewed:   Reviewed: Significant for Abdominal Hysterectomy Medical         Past Medical History:  Diagnosis Date  . Breast CA ([facility]) 01/16/2011  . Breast cancer ([facility])    rt dx 2010; mastectomy only  . Diverticulitis   . Embolism - blood clot    left lung  . Hypertension   . Infiltrating ductal carcinoma of breast ([facility]) 01/16/2011   Started Arimidex on 02/24/2009. Stage IB (T1b N0 M0) grade 1 infiltrating ductal carcinoma of the right breast, status post right modified radical mastectomy on 01/22/2009 for a 10 mm cancer, ER +100%, PR +98%, KI-67 marker was 9%, HER-2 negative, no LVI identified, and 11 lymph nodes were negative, all margins were clear. She had some associated DCIS, is now on Arimidex and she will take that for  . Stroke ([facility])   . Tendonitis 07/2010   shot of cortisone                              Surgical Hx:    Past Surgical History:  Procedure Laterality Date  . ABDOMINAL HYSTERECTOMY  1987  . APPENDECTOMY  1974   blood clot in lung  . BREAST BIOPSY  1968   left  . MASTECTOMY MODIFIED RADICAL  01/22/2009   rt  . NASAL SINUS SURGERY  2002  . TEAR  DUCT PROBING    . tubal ligation  1972  . YAG LASER APPLICATION Right 08/18/2013   Procedure: YAG LASER APPLICATION;  Surgeon: Carroll F Haines, MD;  Location: [facility];  Service: Ophthalmology;  Laterality: Right;   Medications: Reviewed & Updated - see associated section                       Current Outpatient Medications:  .  albuterol (PROVENTIL HFA;VENTOLIN HFA) 108 (90 Base) MCG/ACT inhaler, Inhale 1 puff into the lungs every 6 (six) hours as needed for wheezing or shortness of breath., Disp: , Rfl:  .  amLODipine (NORVASC) 5 MG tablet, Take 5 mg by mouth daily., Disp: , Rfl:  .  aspirin 325 MG tablet, Take 1 tablet (325 mg total) by mouth daily., Disp: 30 tablet, Rfl: 3 .  Calcium Carbonate-Vitamin D (CALCIUM 500 + D PO), Take by mouth daily., Disp: , Rfl:  .  fluticasone (FLONASE) 50 MCG/ACT nasal spray, Place 2 sprays into both nostrils as needed. , Disp: , Rfl:  .  gabapentin (NEURONTIN) 300 MG capsule, Take 300 mg   by mouth at bedtime. , Disp: , Rfl:  .  lovastatin (MEVACOR) 40 MG tablet, Take 40 mg by mouth at bedtime. , Disp: , Rfl:  .  metFORMIN (GLUCOPHAGE) 500 MG tablet, Take 500 mg by mouth 2 (two) times daily. , Disp: , Rfl:  .  metoprolol tartrate (LOPRESSOR) 25 MG tablet, Take 25 mg by mouth daily. , Disp: , Rfl:  .  naphazoline-glycerin (CLEAR EYES) 0.012-0.2 % SOLN, Place 2 drops into both eyes daily as needed for irritation., Disp: , Rfl:  .  Naproxen Sodium (ALEVE PO), Take by mouth as needed., Disp: , Rfl:  .  omeprazole (PRILOSEC) 20 MG capsule, Take 1 capsule by mouth daily., Disp: , Rfl:  .  tiotropium (SPIRIVA) 18 MCG inhalation capsule, Place 18 mcg into inhaler and inhale daily as needed., Disp: , Rfl:  .  TRADJENTA 5 MG TABS tablet, Take 5 mg by mouth daily. , Disp: , Rfl:    Social History: Reviewed -  reports that she has been smoking cigarettes.  She has a 31.00 pack-year smoking history. she has never used smokeless tobacco.  Objective Findings:  Vitals:  Blood pressure (!) 160/100, pulse 90, height 5' 5" (1.651 m), weight 137 lb 6.4 oz (62.3 kg).  PHYSICAL EXAMINATION General appearance - alert, well appearing, and in no distress and oriented to person, place, and time Mental status - alert, oriented to person, place, and time, normal mood, behavior, speech, dress, motor activity, and thought processes, affect appropriate to mood  PELVIC External genitalia - good serous support normal external skin Vulva - normal Vagina - relaxation of vaginal apex cystocele and possible enterocele Cervix - surgically absent Uterus - surgically absent Adnexa - bimanual exam shows some vague tenderness above a flexible relaxed thin vaginal apex Pessary removed with some moderate difficulty due to stenotic introitus and pessary size.  Will reduce as on the ordered pessary to 1.75  Assessment & Plan:   A:  1. Cystocele with good pelvic response  P:  1. She needs a smaller pessary and will order her a [place] 1.75 pessary, #1, with drain 2. Rx premarin  3. F/u 1 week for pessary placement  By signing my name below, I, Jennifer Gorman, attest that this documentation has been prepared under the direction and in the presence of ,  V, MD. Electronically Signed: Jennifer Gorman, Medical Scribe. 09/28/17. 9:24 AM.  I personally performed the services described in this documentation, which was SCRIBED in my presence. The recorded information has been reviewed and considered accurate. It has been edited as necessary during review.  V , MD    

## 2017-09-28 NOTE — Progress Notes (Signed)
[facility] Visit  _0 @            Patient name: [name] MRN [id number]  Date of birth: June 10, 1941  CC & HPI:  GLORENE LEITZKE is a 77 y.o. female presenting today for vulvar discomfort associated with pelvic relaxation.  She is status post hysterectomy with a known cystocele.  ROS:  ROS   Pertinent History Reviewed:   Reviewed: Significant for  Medical         Past Medical History:  Diagnosis Date  . Breast CA ([place]) 01/16/2011  . Breast cancer ([place])    rt dx 2010; mastectomy only  . Diverticulitis   . Embolism - blood clot    left lung  . Hypertension   . Infiltrating ductal carcinoma of breast ([place]) 01/16/2011   Started Arimidex on 02/24/2009. Stage IB (T1b N0 M0) grade 1 infiltrating ductal carcinoma of the right breast, status post right modified radical mastectomy on 01/22/2009 for a 10 mm cancer, ER +100%, PR +98%, KI-67 marker was 9%, HER-2 negative, no LVI identified, and 11 lymph nodes were negative, all margins were clear. She had some associated DCIS, is now on Arimidex and she will take that for  . Stroke ([place])   . Tendonitis 07/2010   shot of cortisone                              Surgical Hx:    Past Surgical History:  Procedure Laterality Date  . ABDOMINAL HYSTERECTOMY  1987  . APPENDECTOMY  1974   blood clot in lung  . BREAST BIOPSY  1968   left  . MASTECTOMY MODIFIED RADICAL  01/22/2009   rt  . NASAL SINUS SURGERY  2002  . TEAR DUCT PROBING    . tubal ligation  1972  . YAG LASER APPLICATION Right 02/29/6552   Procedure: YAG LASER APPLICATION;  Surgeon: Williams Che, MD;  Location: [facility];  Service: Ophthalmology;  Laterality: Right;   Medications: Reviewed & Updated - see associated section                       Current Outpatient Medications:  .  albuterol (PROVENTIL HFA;VENTOLIN HFA) 108 (90 Base) MCG/ACT inhaler, Inhale 1 puff into the lungs every 6 (six) hours as needed for wheezing or shortness of breath., Disp: , Rfl:  .   amLODipine (NORVASC) 5 MG tablet, Take 5 mg by mouth daily., Disp: , Rfl:  .  aspirin 325 MG tablet, Take 1 tablet (325 mg total) by mouth daily., Disp: 30 tablet, Rfl: 3 .  Calcium Carbonate-Vitamin D (CALCIUM 500 + D PO), Take by mouth daily., Disp: , Rfl:  .  fluticasone (FLONASE) 50 MCG/ACT nasal spray, Place 2 sprays into both nostrils as needed. , Disp: , Rfl:  .  gabapentin (NEURONTIN) 300 MG capsule, Take 300 mg by mouth at bedtime. , Disp: , Rfl:  .  lovastatin (MEVACOR) 40 MG tablet, Take 40 mg by mouth at bedtime. , Disp: , Rfl:  .  metFORMIN (GLUCOPHAGE) 500 MG tablet, Take 500 mg by mouth 2 (two) times daily. , Disp: , Rfl:  .  metoprolol tartrate (LOPRESSOR) 25 MG tablet, Take 25 mg by mouth daily. , Disp: , Rfl:  .  naphazoline-glycerin (CLEAR EYES) 0.012-0.2 % SOLN, Place 2 drops into both eyes daily as needed for irritation., Disp: , Rfl:  .  Naproxen Sodium (ALEVE PO), Take by mouth as needed., Disp: , Rfl:  .  omeprazole (PRILOSEC) 20 MG capsule, Take 1 capsule by mouth daily., Disp: , Rfl:  .  tiotropium (SPIRIVA) 18 MCG inhalation capsule, Place 18 mcg into inhaler and inhale daily as needed., Disp: , Rfl:  .  TRADJENTA 5 MG TABS tablet, Take 5 mg by mouth daily. , Disp: , Rfl:    Social History: Reviewed -  reports that she has been smoking cigarettes.  She has a 31.00 pack-year smoking history. she has never used smokeless tobacco.  Objective Findings:  Vitals: Blood pressure (!) 168/78, pulse 83, height 5' 5" (1.651 m), weight 138 lb 3.2 oz (62.7 kg).  PHYSICAL EXAMINATION General appearance - normal appearing weight and chronically ill appearing Mental status - alert, oriented to person, place, and time, normal mood, behavior, speech, dress, motor activity, and thought processes Chest -  Heart - normal rate and regular rhythm Abdomen - soft, nontender, nondistended, no masses or organomegaly Breasts -  Skin -   PELVIC External genitalia - good serous support  normal external skin Vulva -normal Vagina -relaxation of vaginal apex cystocele and possible enterocele Cervix -  surgically absent} Uterus -  surgically absent} Adnexa -bimanual exam shows some vague tenderness above a flexible relaxed thin vaginal apex Wet [place] -not required Rectal - rectal exam not indicated    Assessment & Plan:   A:  1. Pelvic relaxation, pelvic pain offered pessary as an short term process to see if the support gives her some more comfort  1. P pessary was placed and appears to be in good position will leave in overnight and reassess in the morning

## 2017-10-02 ENCOUNTER — Ambulatory Visit: Payer: Medicare HMO | Admitting: Obstetrics and Gynecology

## 2017-10-08 ENCOUNTER — Ambulatory Visit: Payer: Medicare HMO | Admitting: Obstetrics and Gynecology

## 2017-10-10 ENCOUNTER — Ambulatory Visit: Payer: Medicare HMO | Admitting: Obstetrics and Gynecology

## 2017-10-10 ENCOUNTER — Telehealth: Payer: Self-pay | Admitting: *Deleted

## 2017-10-10 NOTE — Telephone Encounter (Signed)
Pharmacy states pessary is not in and she spoke to someone from where it was being shipped and it will not be sent until 3/17. She is going to let  us know as soon as it has arrived as well as inform the patient.

## 2017-10-15 ENCOUNTER — Encounter: Payer: Self-pay | Admitting: Obstetrics and Gynecology

## 2017-10-15 ENCOUNTER — Ambulatory Visit: Payer: Medicare HMO | Admitting: Obstetrics and Gynecology

## 2017-10-15 ENCOUNTER — Other Ambulatory Visit: Payer: Self-pay

## 2017-10-15 VITALS — BP 148/80 | HR 78 | Ht 65.0 in | Wt 135.0 lb

## 2017-10-15 DIAGNOSIS — N8110 Cystocele, unspecified: Secondary | ICD-10-CM

## 2017-10-15 DIAGNOSIS — Z4689 Encounter for fitting and adjustment of other specified devices: Secondary | ICD-10-CM | POA: Diagnosis not present

## 2017-10-15 NOTE — Progress Notes (Signed)
GYNECOLOGY CLINIC PROGRESS NOTE  The patient presented today for a pessary check.  He has brought her 1.75 Gellhorn pessary for insertion she reports no vaginal bleeding or discharge. She denies pelvic discomfort and difficulty urinating or moving her bowels. ?The patient's Gellhorn pessary pessary was inserted without complications. Speculum examination revealed normal vaginal mucosa with no lesions or lacerations. The patient should return in 1 week for a pessary check She will begin to to use vaginal estrogen cream weekly at that time

## 2017-10-22 ENCOUNTER — Ambulatory Visit: Payer: Medicare HMO | Admitting: Obstetrics and Gynecology

## 2017-10-23 ENCOUNTER — Encounter: Payer: Self-pay | Admitting: Gastroenterology

## 2017-10-29 DIAGNOSIS — E1142 Type 2 diabetes mellitus with diabetic polyneuropathy: Secondary | ICD-10-CM | POA: Diagnosis not present

## 2017-10-29 DIAGNOSIS — J449 Chronic obstructive pulmonary disease, unspecified: Secondary | ICD-10-CM | POA: Diagnosis not present

## 2017-10-29 DIAGNOSIS — F419 Anxiety disorder, unspecified: Secondary | ICD-10-CM | POA: Diagnosis not present

## 2017-10-29 DIAGNOSIS — K219 Gastro-esophageal reflux disease without esophagitis: Secondary | ICD-10-CM | POA: Diagnosis not present

## 2017-10-29 DIAGNOSIS — J309 Allergic rhinitis, unspecified: Secondary | ICD-10-CM | POA: Diagnosis not present

## 2017-10-31 ENCOUNTER — Ambulatory Visit: Payer: Medicare HMO | Admitting: Obstetrics and Gynecology

## 2017-10-31 ENCOUNTER — Encounter: Payer: Self-pay | Admitting: Obstetrics and Gynecology

## 2017-10-31 DIAGNOSIS — B9689 Other specified bacterial agents as the cause of diseases classified elsewhere: Secondary | ICD-10-CM

## 2017-10-31 DIAGNOSIS — N760 Acute vaginitis: Secondary | ICD-10-CM

## 2017-10-31 DIAGNOSIS — N8110 Cystocele, unspecified: Secondary | ICD-10-CM | POA: Diagnosis not present

## 2017-10-31 DIAGNOSIS — N815 Vaginal enterocele: Secondary | ICD-10-CM | POA: Insufficient documentation

## 2017-10-31 MED ORDER — METRONIDAZOLE 0.75 % VA GEL: 1.0000 | VAGINAL | 4 refills | Status: AC

## 2017-10-31 NOTE — Progress Notes (Signed)
Patient ID: [name], female   DOB: 1940/10/16, 77 y.o.   MRN: [id number]  GYNECOLOGY CLINIC PROGRESS NOTE  The patient presented today for a pessary check. She had a 1.75 Gellhorn pessary placed on 10/15/2017. She reports an intermittent bad odor that" smells like a skunk". She was scheduled for a follow-up last week but had to rescheduled due to episodes of diarrhea. [name] denies any discomfort and adds that it has been helping her.   ROS All systems are negative except as noted in the HPI and PMH.   PHYSICAL EXAMINATION General appearance - alert, well appearing, and in no distress and oriented to person, place, and time Mental status - alert, oriented to person, place, and time, normal mood, behavior, speech, dress, motor activity, and thought processes, affect appropriate to mood  PELVIC External genitalia - normal Vagina - good posterior support, pessary removed, vagina inspected, no lesions or lacerations noted Pessary reinserted.  A: 1. Symptomatic pelvic relaxtion 2. BV  P: 1. Rx Metrogel, once a week, pessary inserted, 2. F/u 1 year  By signing my name below, I, Margit Banda, attest that this documentation has been prepared under the direction and in the presence of Jonnie Kind, MD. Electronically Signed: Margit Banda, Medical Scribe. 10/31/17. 2:40 PM.  I personally performed the services described in this documentation, which was SCRIBED in my presence. The recorded information has been reviewed and considered accurate. It has been edited as necessary during review. Jonnie Kind, MD

## 2017-11-29 ENCOUNTER — Ambulatory Visit (INDEPENDENT_AMBULATORY_CARE_PROVIDER_SITE_OTHER): Payer: Self-pay

## 2017-11-29 DIAGNOSIS — Z1211 Encounter for screening for malignant neoplasm of colon: Secondary | ICD-10-CM

## 2017-11-29 MED ORDER — NA SULFATE-K SULFATE-MG SULF 17.5-3.13-1.6 GM/177ML PO SOLN: 1.0000 | ORAL | 0 refills | Status: DC

## 2017-11-29 NOTE — Progress Notes (Signed)
Gastroenterology Pre-Procedure Review  Request Date:11/29/17 Requesting Physician: 10 year recall SLF- last tcs 11/21/07- normal tcs  PATIENT REVIEW QUESTIONS: The patient responded to the following health history questions as indicated:    1. Diabetes Melitis: yes (metformin and trajenta) 2. Joint replacements in the past 12 months: no 3. Major health problems in the past 3 months: yes (bladder fell) 4. Has an artificial valve or MVP: no 5. Has a defibrillator: no 6. Has been advised in past to take antibiotics in advance of a procedure like teeth cleaning: no 7. Family history of colon cancer: yes (daughter age 78)  67. Alcohol Use: no 9. History of sleep apnea: no  10. History of coronary artery or other vascular stents placed within the last 12 months: no 11. History of any prior anesthesia complications: no    MEDICATIONS & ALLERGIES:    Patient reports the following regarding taking any blood thinners:   Plavix? no Aspirin? yes (325 mg) Coumadin? no Brilinta? no Xarelto? no Eliquis? no Pradaxa? no Savaysa? no Effient? no  Patient confirms/reports the following medications:  Current Outpatient Medications  Medication Sig Dispense Refill  . albuterol (PROVENTIL HFA;VENTOLIN HFA) 108 (90 Base) MCG/ACT inhaler Inhale 1 puff into the lungs every 6 (six) hours as needed for wheezing or shortness of breath.    Marland Kitchen amLODipine (NORVASC) 5 MG tablet Take 5 mg by mouth daily.    Marland Kitchen aspirin 325 MG tablet Take 1 tablet (325 mg total) by mouth daily. 30 tablet 3  . Calcium Carbonate-Vitamin D (CALCIUM 500 + D PO) Take by mouth daily.    . fluticasone (FLONASE) 50 MCG/ACT nasal spray Place 2 sprays into both nostrils as needed.     . gabapentin (NEURONTIN) 300 MG capsule Take 300 mg by mouth at bedtime.     . lovastatin (MEVACOR) 40 MG tablet Take 40 mg by mouth at bedtime.     . metFORMIN (GLUCOPHAGE) 500 MG tablet Take 500 mg by mouth 2 (two) times daily.     . metoprolol tartrate  (LOPRESSOR) 25 MG tablet Take 25 mg by mouth daily.     . metroNIDAZOLE (METROGEL) 0.75 % vaginal gel Place 1 Applicatorful vaginally once a week. one applicatorful to vagina at bedtime weekly , as needed for vaginal discharge with pessary 70 g 4  . naphazoline-glycerin (CLEAR EYES) 0.012-0.2 % SOLN Place 2 drops into both eyes daily as needed for irritation.    Marland Kitchen omeprazole (PRILOSEC) 20 MG capsule Take 1 capsule by mouth daily.    Marland Kitchen tiotropium (SPIRIVA) 18 MCG inhalation capsule Place 18 mcg into inhaler and inhale daily as needed.    . TRADJENTA 5 MG TABS tablet Take 5 mg by mouth daily.     Marland Kitchen UNABLE TO FIND Med Name: jell horn once a week     No current facility-administered medications for this visit.     Patient confirms/reports the following allergies:  Allergies  Allergen Reactions  . Pollen Extract Other (See Comments)    Runny nose, watery eyes and ears get stopped up    No orders of the defined types were placed in this encounter.   [place] INFORMATION Primary Insurance: [facility],  ID #: [id number] Pre-Cert / Josem Kaufmann required: no   SCHEDULE INFORMATION: Procedure has been scheduled as follows:  Date: 01/11/18, Time: 10:00am Location: [facility] [address]  This Gastroenterology Pre-Precedure Review Form is being routed to the following provider(s): Neil Crouch, PA

## 2017-11-29 NOTE — Patient Instructions (Signed)
[name]  1941-04-09 MRN: [id number]     Procedure Date: 01/11/18 Time to register: 9:00am Place to register: [facility] Procedure Time: 10:00am Scheduled provider: Barney Drain, MD    PREPARATION FOR COLONOSCOPY WITH SUPREP BOWEL PREP KIT  Note: Suprep Bowel Prep Kit is a split-dose (2day) regimen. Consumption of BOTH 6-ounce bottles is required for a complete prep.  Please notify us immediately if you are diabetic, take iron supplements, or if you are on Coumadin or any other blood thinners.  Please hold the following medications:I will mail you a letter with this information                                                                                                                                                  1 DAY BEFORE PROCEDURE:  DATE: 01/10/18   DAY: Thursday Continue clear liquids the entire day - NO SOLID FOOD.   Diabetic medications adjustments for today: see letter  At 6:00pm: Complete steps 1 through 4 below, using ONE (1) 6-ounce bottle, before going to bed. Step 1:  Pour ONE (1) 6-ounce bottle of SUPREP liquid into the mixing container.  Step 2:  Add cool drinking water to the 16 ounce line on the container and mix.  Note: Dilute the solution concentrate as directed prior to use. Step 3:  DRINK ALL the liquid in the container. Step 4:  You MUST drink an additional two (2) or more 16 ounce containers of water over the next one (1) hour.   Continue clear liquids only, until midnight. Do not eat or drink anything after midnight. EXCEPTION: If you take medications for your heart, blood pressure, or breathing, you may take these medications with a small amount of clear liquid.   DAY OF PROCEDURE:   DATE: 01/11/18   DAY: Friday  Diabetic medications adjustments for today:see letter  5 hours before your procedure at :5:00am Step 1:  Pour ONE (1) 6-ounce bottle of SUPREP liquid into the mixing container.  Step 2:  Add cool drinking water to the 16  ounce line on the container and mix.  Note: Dilute the solution concentrate as directed prior to use. Step 3:  DRINK ALL the liquid in the container. Step 4:  You MUST drink an additional two (2) or more 16 ounce containers of water over the next one (1) hour. You MUST complete the final glass of water at least 3 hours before your colonoscopy.   Nothing by mouth past 7:00am  You may take your morning medications with sip of water unless we have instructed otherwise.    Please see below for Dietary Information.  CLEAR LIQUIDS INCLUDE:  Water Jello (NOT red in color)   Ice Popsicles (NOT red in color)   Tea (sugar ok, no milk/cream) Powdered fruit flavored drinks  Coffee (sugar ok,  no milk/cream) Gatorade/ Lemonade/ Kool-Aid  (NOT red in color)   Juice: apple, white grape, white cranberry Soft drinks  Clear bullion, consomme, broth (fat free beef/chicken/vegetable)  Carbonated beverages (any kind)  Strained chicken noodle soup Hard Candy   Remember: Clear liquids are liquids that will allow you to see your fingers on the other side of a clear glass. Be sure liquids are NOT red in color, and not cloudy, but CLEAR.  DO NOT EAT OR DRINK ANY OF THE FOLLOWING:  Dairy products of any kind   Cranberry juice Tomato juice / V8 juice   Grapefruit juice Orange juice     Red grape juice  Do not eat any solid foods, including such foods as: cereal, oatmeal, yogurt, fruits, vegetables, creamed soups, eggs, bread, crackers, pureed foods in a blender, etc.   HELPFUL HINTS FOR DRINKING PREP SOLUTION:   Make sure prep is extremely cold. Mix and refrigerate the the morning of the prep. You may also put in the freezer.   You may try mixing some Crystal Light or Country Time Lemonade if you prefer. Mix in small amounts; add more if necessary.  Try drinking through a straw  Rinse mouth with water or a mouthwash between glasses, to remove after-taste.  Try sipping on a cold beverage /ice/ popsicles  between glasses of prep.  Place a piece of sugar-free hard candy in mouth between glasses.  If you become nauseated, try consuming smaller amounts, or stretch out the time between glasses. Stop for 30-60 minutes, then slowly start back drinking.     OTHER INSTRUCTIONS  You will need a responsible adult at least 78 years of age to accompany you and drive you home. This person must remain in the waiting room during your procedure. The hospital will cancel your procedure if you do not have a responsible adult with you.   1. Wear loose fitting clothing that is easily removed. 2. Leave jewelry and other valuables at home.  3. Remove all body piercing jewelry and leave at home. 4. Total time from sign-in until discharge is approximately 2-3 hours. 5. You should go home directly after your procedure and rest. You can resume normal activities the day after your procedure. 6. The day of your procedure you should not:  Drive  Make legal decisions  Operate machinery  Drink alcohol  Return to work   You may call the office (Dept: [phone]) before 5:00pm, or page the doctor on call [phone]) after 5:00pm, for further instructions, if necessary.   Insurance Information YOU WILL NEED TO CHECK WITH YOUR INSURANCE COMPANY FOR THE BENEFITS OF COVERAGE YOU HAVE FOR THIS PROCEDURE.  UNFORTUNATELY, NOT ALL INSURANCE COMPANIES HAVE BENEFITS TO COVER ALL OR PART OF THESE TYPES OF PROCEDURES.  IT IS YOUR RESPONSIBILITY TO CHECK YOUR BENEFITS, HOWEVER, WE WILL BE GLAD TO ASSIST YOU WITH ANY CODES YOUR INSURANCE COMPANY MAY NEED.    PLEASE NOTE THAT MOST INSURANCE COMPANIES WILL NOT COVER A SCREENING COLONOSCOPY FOR PEOPLE UNDER THE AGE OF 50  IF YOU HAVE BCBS INSURANCE, YOU MAY HAVE BENEFITS FOR A SCREENING COLONOSCOPY BUT IF POLYPS ARE FOUND THE DIAGNOSIS WILL CHANGE AND THEN YOU MAY HAVE A DEDUCTIBLE THAT WILL NEED TO BE MET. SO PLEASE MAKE SURE YOU CHECK YOUR BENEFITS FOR A SCREENING  COLONOSCOPY AS WELL AS A DIAGNOSTIC COLONOSCOPY.

## 2017-11-30 NOTE — Progress Notes (Signed)
Ok to schedule.  Day before TCS: metformin 1/2 tab BID, tradjenta 1/2 tab qd. AM of TCS: hold metformin and tradjenta.   WHAT IS THE MED "JELL HORN"??? PLEASE LET ME KNOW BEFORE SCHEDULING.

## 2017-11-30 NOTE — Progress Notes (Signed)
Looked up this information, pt had spelled it incorrectly- it is a gellhorn pessary that is used for a prolapsed bladder, I have taken this off of her med list and added it to her history.

## 2017-11-30 NOTE — Addendum Note (Signed)
Addended by: Claudina Lick on: 11/30/2017 10:43 AM   Modules accepted: Orders

## 2017-11-30 NOTE — Progress Notes (Signed)
She had it on her med list paper she brought in and she described it to me as something she inserts to help with her  fallen bladder. She said she wasn't sure if it was spelled right and really couldn't tell me a lot about it.

## 2017-12-05 NOTE — Progress Notes (Signed)
Letter mailed to the pt with DM instructions

## 2017-12-14 ENCOUNTER — Other Ambulatory Visit (HOSPITAL_COMMUNITY): Payer: Self-pay | Admitting: Internal Medicine

## 2017-12-14 DIAGNOSIS — Z1231 Encounter for screening mammogram for malignant neoplasm of breast: Secondary | ICD-10-CM

## 2018-01-11 ENCOUNTER — Encounter (HOSPITAL_COMMUNITY)
Admission: RE | Disposition: A | Discharge status: Home / Self Care | Payer: Self-pay | Source: Ambulatory Visit | Attending: Gastroenterology

## 2018-01-11 ENCOUNTER — Other Ambulatory Visit: Payer: Self-pay

## 2018-01-11 ENCOUNTER — Ambulatory Visit (HOSPITAL_COMMUNITY)
Admission: RE | Admit: 2018-01-11 | Discharge: 2018-01-11 | Disposition: A | Discharge status: Home / Self Care | Payer: Medicare HMO | Source: Ambulatory Visit | Attending: Gastroenterology | Admitting: Gastroenterology

## 2018-01-11 ENCOUNTER — Encounter (HOSPITAL_COMMUNITY): Payer: Self-pay | Admitting: *Deleted

## 2018-01-11 DIAGNOSIS — Z7984 Long term (current) use of oral hypoglycemic drugs: Secondary | ICD-10-CM | POA: Diagnosis not present

## 2018-01-11 DIAGNOSIS — Z8673 Personal history of transient ischemic attack (TIA), and cerebral infarction without residual deficits: Secondary | ICD-10-CM | POA: Diagnosis not present

## 2018-01-11 DIAGNOSIS — Z853 Personal history of malignant neoplasm of breast: Secondary | ICD-10-CM | POA: Diagnosis not present

## 2018-01-11 DIAGNOSIS — K648 Other hemorrhoids: Secondary | ICD-10-CM | POA: Diagnosis not present

## 2018-01-11 DIAGNOSIS — Z9011 Acquired absence of right breast and nipple: Secondary | ICD-10-CM | POA: Diagnosis not present

## 2018-01-11 DIAGNOSIS — F17210 Nicotine dependence, cigarettes, uncomplicated: Secondary | ICD-10-CM | POA: Diagnosis not present

## 2018-01-11 DIAGNOSIS — Z79899 Other long term (current) drug therapy: Secondary | ICD-10-CM | POA: Insufficient documentation

## 2018-01-11 DIAGNOSIS — D124 Benign neoplasm of descending colon: Secondary | ICD-10-CM | POA: Insufficient documentation

## 2018-01-11 DIAGNOSIS — Z1211 Encounter for screening for malignant neoplasm of colon: Secondary | ICD-10-CM | POA: Insufficient documentation

## 2018-01-11 DIAGNOSIS — I10 Essential (primary) hypertension: Secondary | ICD-10-CM | POA: Insufficient documentation

## 2018-01-11 DIAGNOSIS — Z86711 Personal history of pulmonary embolism: Secondary | ICD-10-CM | POA: Diagnosis not present

## 2018-01-11 DIAGNOSIS — D122 Benign neoplasm of ascending colon: Secondary | ICD-10-CM | POA: Diagnosis not present

## 2018-01-11 DIAGNOSIS — Z7982 Long term (current) use of aspirin: Secondary | ICD-10-CM | POA: Diagnosis not present

## 2018-01-11 DIAGNOSIS — K5730 Diverticulosis of large intestine without perforation or abscess without bleeding: Secondary | ICD-10-CM | POA: Insufficient documentation

## 2018-01-11 HISTORY — PX: COLONOSCOPY: SHX5424

## 2018-01-11 HISTORY — PX: POLYPECTOMY: SHX5525

## 2018-01-11 LAB — GLUCOSE, CAPILLARY: Glucose-Capillary: 154 mg/dL — ABNORMAL HIGH (ref 65–99)

## 2018-01-11 SURGERY — COLONOSCOPY
Anesthesia: Moderate Sedation

## 2018-01-11 MED ORDER — SODIUM CHLORIDE 0.9 % IV SOLN
INTRAVENOUS | Status: DC
  Administered 2018-01-11: 10:00:00 via INTRAVENOUS

## 2018-01-11 MED ORDER — MIDAZOLAM HCL 5 MG/5ML IJ SOLN
INTRAMUSCULAR | Status: DC | PRN
  Administered 2018-01-11: 1 mg via INTRAVENOUS
  Administered 2018-01-11 (×2): 2 mg via INTRAVENOUS

## 2018-01-11 MED ORDER — MIDAZOLAM HCL 5 MG/5ML IJ SOLN
INTRAMUSCULAR | Status: AC
  Filled 2018-01-11: qty 10

## 2018-01-11 MED ORDER — MEPERIDINE HCL 100 MG/ML IJ SOLN
INTRAMUSCULAR | Status: AC
  Filled 2018-01-11: qty 2

## 2018-01-11 MED ORDER — STERILE WATER FOR IRRIGATION IR SOLN
Status: DC | PRN
  Administered 2018-01-11: 2.5 mL

## 2018-01-11 MED ORDER — MEPERIDINE HCL 100 MG/ML IJ SOLN
INTRAMUSCULAR | Status: DC | PRN
  Administered 2018-01-11 (×3): 25 mg via INTRAVENOUS

## 2018-01-11 NOTE — Discharge Instructions (Signed)
You have small internal hemorrhoids and diverticulosis IN YOUR LEFT AND RIGHT COLON. YOU HAD THREE POLYPS REMOVED.    DRINK WATER TO KEEP YOUR URINE LIGHT YELLOW.  FOLLOW A HIGH FIBER DIET. AVOID ITEMS THAT CAUSE BLOATING. See info below.  YOUR BIOPSY RESULTS WILL BE AVAILABLE IN 7 DAYS.  USE PREPARATION H FOUR TIMES  A DAY IF NEEDED TO RELIEVE RECTAL PAIN/PRESSURE/BLEEDING.   Colonoscopy Care After Read the instructions outlined below and refer to this sheet in the next week. These discharge instructions provide you with general information on caring for yourself after you leave the hospital. While your treatment has been planned according to the most current medical practices available, unavoidable complications occasionally occur. If you have any problems or questions after discharge, call DR. Che Rachal, [phone].  ACTIVITY  You may resume your regular activity, but move at a slower pace for the next 24 hours.   Take frequent rest periods for the next 24 hours.   Walking will help get rid of the air and reduce the bloated feeling in your belly (abdomen).   No driving for 24 hours (because of the medicine (anesthesia) used during the test).   You may shower.   Do not sign any important legal documents or operate any machinery for 24 hours (because of the anesthesia used during the test).    NUTRITION  Drink plenty of fluids.   You may resume your normal diet as instructed by your doctor.   Begin with a light meal and progress to your normal diet. Heavy or fried foods are harder to digest and may make you feel sick to your stomach (nauseated).   Avoid alcoholic beverages for 24 hours or as instructed.    MEDICATIONS  You may resume your normal medications.   WHAT YOU CAN EXPECT TODAY  Some feelings of bloating in the abdomen.   Passage of more gas than usual.   Spotting of blood in your stool or on the toilet paper  .  IF YOU HAD POLYPS REMOVED DURING THE  COLONOSCOPY:  Eat a soft diet IF YOU HAVE NAUSEA, BLOATING, ABDOMINAL PAIN, OR VOMITING.    FINDING OUT THE RESULTS OF YOUR TEST Not all test results are available during your visit. DR. Oneida Alar WILL CALL YOU WITHIN 14 DAYS OF YOUR PROCEDUE WITH YOUR RESULTS. Do not assume everything is normal if you have not heard from DR. Jarrette Dehner, CALL HER OFFICE AT [phone].  SEEK IMMEDIATE MEDICAL ATTENTION AND CALL THE OFFICE: [phone] IF:  You have more than a spotting of blood in your stool.   Your belly is swollen (abdominal distention).   You are nauseated or vomiting.   You have a temperature over 101F.   You have abdominal pain or discomfort that is severe or gets worse throughout the day.  High-Fiber Diet A high-fiber diet changes your normal diet to include more whole grains, legumes, fruits, and vegetables. Changes in the diet involve replacing refined carbohydrates with unrefined foods. The calorie level of the diet is essentially unchanged. The Dietary Reference Intake (recommended amount) for adult males is 38 grams per day. For adult females, it is 25 grams per day. Pregnant and lactating women should consume 28 grams of fiber per day. Fiber is the intact part of a plant that is not broken down during digestion. Functional fiber is fiber that has been isolated from the plant to provide a beneficial effect in the body. PURPOSE  Increase stool bulk.   Ease and regulate  bowel movements.   Lower cholesterol.   REDUCE RISK OF COLON CANCER  INDICATIONS THAT YOU NEED MORE FIBER  Constipation and hemorrhoids.   Uncomplicated diverticulosis (intestine condition) and irritable bowel syndrome.   Weight management.   As a protective measure against hardening of the arteries (atherosclerosis), diabetes, and cancer.   GUIDELINES FOR INCREASING FIBER IN THE DIET  Start adding fiber to the diet slowly. A gradual increase of about 5 more grams (2 slices of whole-wheat bread, 2  servings of most fruits or vegetables, or 1 bowl of high-fiber cereal) per day is best. Too rapid an increase in fiber may result in constipation, flatulence, and bloating.   Drink enough water and fluids to keep your urine clear or pale yellow. Water, juice, or caffeine-free drinks are recommended. Not drinking enough fluid may cause constipation.   Eat a variety of high-fiber foods rather than one type of fiber.   Try to increase your intake of fiber through using high-fiber foods rather than fiber pills or supplements that contain small amounts of fiber.   The goal is to change the types of food eaten. Do not supplement your present diet with high-fiber foods, but replace foods in your present diet.   INCLUDE A VARIETY OF FIBER SOURCES  Replace refined and processed grains with whole grains, canned fruits with fresh fruits, and incorporate other fiber sources. White rice, white breads, and most bakery goods contain little or no fiber.   Brown whole-grain rice, buckwheat oats, and many fruits and vegetables are all good sources of fiber. These include: broccoli, Brussels sprouts, cabbage, cauliflower, beets, sweet potatoes, white potatoes (skin on), carrots, tomatoes, eggplant, squash, berries, fresh fruits, and dried fruits.   Cereals appear to be the richest source of fiber. Cereal fiber is found in whole grains and bran. Bran is the fiber-rich outer coat of cereal grain, which is largely removed in refining. In whole-grain cereals, the bran remains. In breakfast cereals, the largest amount of fiber is found in those with "bran" in their names. The fiber content is sometimes indicated on the label.   You may need to include additional fruits and vegetables each day.   In baking, for 1 cup white flour, you may use the following substitutions:   1 cup whole-wheat flour minus 2 tablespoons.   1/2 cup white flour plus 1/2 cup whole-wheat flour.   Polyps, Colon  A polyp is extra tissue that  grows inside your body. Colon polyps grow in the large intestine. The large intestine, also called the colon, is part of your digestive system. It is a long, hollow tube at the end of your digestive tract where your body makes and stores stool. Most polyps are not dangerous. They are benign. This means they are not cancerous. But over time, some types of polyps can turn into cancer. Polyps that are smaller than a pea are usually not harmful. But larger polyps could someday become or may already be cancerous. To be safe, doctors remove all polyps and test them.   PREVENTION There is not one sure way to prevent polyps. You might be able to lower your risk of getting them if you:  Eat more fruits and vegetables and less fatty food.   Do not smoke.   Avoid alcohol.   Exercise every day.   Lose weight if you are overweight.   Eating more calcium and folate can also lower your risk of getting polyps. Some foods that are rich in calcium are  milk, cheese, and broccoli. Some foods that are rich in folate are chickpeas, kidney beans, and spinach.    Diverticulosis Diverticulosis is a common condition that develops when small pouches (diverticula) form in the wall of the colon. The risk of diverticulosis increases with age. It happens more often in people who eat a low-fiber diet. Most individuals with diverticulosis have no symptoms. Those individuals with symptoms usually experience belly (abdominal) pain, constipation, or loose stools (diarrhea).  HOME CARE INSTRUCTIONS  Increase the amount of fiber in your diet as directed by your caregiver or dietician. This may reduce symptoms of diverticulosis.   Drink at least 6 to 8 glasses of water each day to prevent constipation.   Try not to strain when you have a bowel movement.   Avoiding nuts and seeds to prevent complications is NOT NECESSARY.   FOODS HAVING HIGH FIBER CONTENT INCLUDE:  Fruits. Apple, peach, pear, tangerine, raisins, prunes.     Vegetables. Brussels sprouts, asparagus, broccoli, cabbage, carrot, cauliflower, romaine lettuce, spinach, summer squash, tomato, winter squash, zucchini.   Starchy Vegetables. Baked beans, kidney beans, lima beans, split peas, lentils, potatoes (with skin).   Grains. Whole wheat bread, brown rice, bran flake cereal, plain oatmeal, white rice, shredded wheat, bran muffins.   SEEK IMMEDIATE MEDICAL CARE IF:  You develop increasing pain or severe bloating.   You have an oral temperature above 101F.   You develop vomiting or bowel movements that are bloody or black.   Hemorrhoids Hemorrhoids are dilated (enlarged) veins around the rectum. Sometimes clots will form in the veins. This makes them swollen and painful. These are called thrombosed hemorrhoids. Causes of hemorrhoids include:  Constipation.   Straining to have a bowel movement.   HEAVY LIFTING   HOME CARE INSTRUCTIONS  Eat a well balanced diet and drink 6 to 8 glasses of water every day to avoid constipation. You may also use a bulk laxative.   Avoid straining to have bowel movements.   Keep anal area dry and clean.   Do not use a donut shaped pillow or sit on the toilet for long periods. This increases blood pooling and pain.   Move your bowels when your body has the urge; this will require less straining and will decrease pain and pressure.

## 2018-01-11 NOTE — H&P (Signed)
Primary Care Physician:  Rosita Fire, MD Primary Gastroenterologist:  Dr. Oneida Alar  Pre-Procedure History & Physical: HPI:  [name] is a 77 y.o. female here for [place].  Past Medical History:  Diagnosis Date  . Breast CA ([place]) 01/16/2011  . Breast cancer ([place])    rt dx 2010; mastectomy only  . Diverticulitis   . Embolism - blood clot    left lung  . History of prolapse of bladder    patient has gellhorn pessary  . Hypertension   . Infiltrating ductal carcinoma of breast ([place]) 01/16/2011   Started Arimidex on 02/24/2009. Stage IB (T1b N0 M0) grade 1 infiltrating ductal carcinoma of the right breast, status post right modified radical mastectomy on 01/22/2009 for a 10 mm cancer, ER +100%, PR +98%, KI-67 marker was 9%, HER-2 negative, no LVI identified, and 11 lymph nodes were negative, all margins were clear. She had some associated DCIS, is now on Arimidex and she will take that for  . Stroke ([place])   . Tendonitis 07/2010   shot of cortisone    Past Surgical History:  Procedure Laterality Date  . ABDOMINAL HYSTERECTOMY  1987  . APPENDECTOMY  1974   blood clot in lung  . BREAST BIOPSY  1968   left  . MASTECTOMY MODIFIED RADICAL  01/22/2009   rt  . NASAL SINUS SURGERY  2002  . TEAR DUCT PROBING    . tubal ligation  1972  . YAG LASER APPLICATION Right 1/79/1505   Procedure: YAG LASER APPLICATION;  Surgeon: Williams Che, MD;  Location: [facility];  Service: Ophthalmology;  Laterality: Right;    Prior to Admission medications   Medication Sig Start Date End Date Taking? Authorizing Provider  albuterol (PROVENTIL HFA;VENTOLIN HFA) 108 (90 Base) MCG/ACT inhaler Inhale 1 puff into the lungs every 6 (six) hours as needed for wheezing or shortness of breath.   Yes [provider]  amLODipine (NORVASC) 5 MG tablet Take 5 mg by mouth daily.   Yes [provider]  aspirin 325 MG tablet Take 1 tablet (325 mg total) by mouth daily. 03/24/16  Yes Rosita Fire, MD  Calcium Carbonate-Vitamin D (CALCIUM 500 + D PO) Take by mouth daily.   Yes [provider]  fluticasone (FLONASE) 50 MCG/ACT nasal spray Place 2 sprays into both nostrils as needed.  01/12/16  Yes [provider]  gabapentin (NEURONTIN) 300 MG capsule Take 300 mg by mouth at bedtime.  02/12/16  Yes [provider]  lovastatin (MEVACOR) 40 MG tablet Take 40 mg by mouth at bedtime.    Yes [provider]  metFORMIN (GLUCOPHAGE) 500 MG tablet Take 500 mg by mouth 2 (two) times daily.  02/12/16  Yes [provider]  metoprolol tartrate (LOPRESSOR) 25 MG tablet Take 25 mg by mouth daily.    Yes [provider]  metroNIDAZOLE (METROGEL) 0.75 % vaginal gel Place 1 Applicatorful vaginally once a week. one applicatorful to vagina at bedtime weekly , as needed for vaginal discharge with pessary 10/31/17  Yes Jonnie Kind, MD  Na Sulfate-K Sulfate-Mg Sulf (SUPREP BOWEL PREP KIT) 17.5-3.13-1.6 GM/177ML SOLN Take 1 kit by mouth as directed. 11/29/17  Yes Mahala Menghini, PA-C  naphazoline-glycerin (CLEAR EYES) 0.012-0.2 % SOLN Place 2 drops into both eyes daily as needed for irritation.   Yes [provider]  omeprazole (PRILOSEC) 20 MG capsule Take 1 capsule by mouth daily. 06/28/13  Yes [provider]  tiotropium (  SPIRIVA) 18 MCG inhalation capsule Place 18 mcg into inhaler and inhale daily as needed.   Yes [provider]  TRADJENTA 5 MG TABS tablet Take 5 mg by mouth daily.  02/12/16  Yes [provider]    Allergies as of 11/29/2017 - Review Complete 11/29/2017  Allergen Reaction Noted  . Pollen extract Other (See Comments) 02/10/2014    Family History  Problem Relation Age of Onset  . Breast cancer Sister   . Breast cancer Maternal Grandmother   . Colon cancer Daughter   . Hypertension Paternal Grandfather   . Stroke Paternal Grandfather   . Other Paternal Grandmother        hardening of arteries   . Other Father        gun shot wound  . Cancer Sister   . Cancer Daughter   . Stroke Son   . Heart attack Son     Social History   Socioeconomic History  . Marital status: Divorced    Spouse name: Not on file  . Number of children: Not on file  . Years of education: Not on file  . Highest education level: Not on file  Occupational History  . Not on file  Social Needs  . Financial resource strain: Not on file  . Food insecurity:    Worry: Not on file    Inability: Not on file  . Transportation needs:    Medical: Not on file    Non-medical: Not on file  Tobacco Use  . Smoking status: Current Every Day Smoker    Packs/day: 0.50    Years: 62.00    Pack years: 31.00    Types: Cigarettes  . Smokeless tobacco: Never Used  Substance and Sexual Activity  . Alcohol use: Yes    Comment: occ  . Drug use: No  . Sexual activity: Never    Birth control/protection: Surgical    Comment: hyst  Lifestyle  . Physical activity:    Days per week: Not on file    Minutes per session: Not on file  . Stress: Not on file  Relationships  . Social connections:    Talks on phone: Not on file    Gets together: Not on file    Attends religious service: Not on file    Active member of club or organization: Not on file    Attends meetings of clubs or organizations: Not on file    Relationship status: Not on file  . Intimate partner violence:    Fear of current or ex partner: Not on file    Emotionally abused: Not on file    Physically abused: Not on file    Forced sexual activity: Not on file  Other Topics Concern  . Not on file  Social History Narrative  . Not on file    Review of Systems: See HPI, otherwise negative ROS   Physical Exam: BP 140/63   Pulse 77   Temp 98.5 F (36.9 C) (Oral)   Resp 19   Ht _0  (1.651 m)   Wt 135 lb (61.2 kg)   SpO2 99%   BMI 22.47 kg/m  General:   Alert,  pleasant and cooperative in NAD Head:  Normocephalic and atraumatic. Neck:   Supple; Lungs:  Clear throughout to auscultation.    Heart:  Regular rate and rhythm. Abdomen:  Soft, nontender and nondistended. Normal bowel sounds, without guarding, and without rebound.   Neurologic:  Alert and  oriented x4;  grossly normal neurologically.  Impression/Plan:    SCREENING  Plan:  1. TCS TODAY DISCUSSED PROCEDURE, BENEFITS, & RISKS: < 1% chance of medication reaction, bleeding, perforation, or rupture of spleen/liver.

## 2018-01-11 NOTE — Op Note (Signed)
[facility] Patient Name: [name] Procedure Date: 01/11/2018 10:09 AM MRN: [id number] Date of Birth: November 05, 1940 Attending MD: Barney Drain MD, MD CSN: [id number] Age: 77 Admit Type: Outpatient Procedure:                Colonoscopy Indications:              Screening for colorectal malignant neoplasm Providers:                Barney Drain MD, MD, Lurline Del, RN, Nelma Rothman,                            Technician Referring MD:             Rosita Fire MD, MD Medicines:                Meperidine 75 mg IV, Midazolam 5 mg IV Complications:            No immediate complications. Estimated Blood Loss:     Estimated blood loss was minimal. Procedure:                Pre-Anesthesia Assessment:                           - Prior to the procedure, a History and Physical                            was performed, and patient medications and                            allergies were reviewed. The patient's tolerance of                            previous anesthesia was also reviewed. The risks                            and benefits of the procedure and the sedation                            options and risks were discussed with the patient.                            All questions were answered, and informed consent                            was obtained. Prior Anticoagulants: The patient has                            taken aspirin, last dose was 1 day prior to                            procedure. ASA Grade Assessment: II - A patient                            with mild systemic disease. After reviewing the  risks and benefits, the patient was deemed in                            satisfactory condition to undergo the procedure.                            After obtaining informed consent, the colonoscope                            was passed under direct vision. Throughout the                            procedure, the patient's blood pressure, pulse, and                             oxygen saturations were monitored continuously. The                            EC-[id number] ([id number]) scope was introduced through                            the anus and advanced to the the cecum, identified                            by appendiceal orifice and ileocecal valve. The                            colonoscopy was technically difficult and complex                            due to significant looping. Successful completion                            of the procedure was aided by straightening and                            shortening the scope to obtain bowel loop reduction                            and COLOWRAP. The patient tolerated the procedure                            well. The quality of the bowel preparation was                            good. The ileocecal valve, appendiceal orifice, and                            rectum were photographed. Scope In: 10:39:40 AM Scope Out: 11:17:35 AM Scope Withdrawal Time: 0 hours 30 minutes 24 seconds  Total Procedure Duration: 0 hours 37 minutes 55 seconds  Findings:      Two sessile polyps were found in the ascending colon. The polyps were 5  to 7 mm in size. These polyps were removed with a cold snare. Resection       and retrieval were complete.      A 8 mm polyp was found in the descending colon. The polyp was sessile.       The polyp was removed with a hot snare. Resection and retrieval were       complete.      Multiple small and large-mouthed diverticula were found in the       recto-sigmoid colon, sigmoid colon, descending colon, transverse colon,       hepatic flexure and ascending colon.      Internal hemorrhoids were found during retroflexion. The hemorrhoids       were small.      The recto-sigmoid colon, sigmoid colon and descending colon revealed       grossly excessive looping. Impression:               - Two 5 to 7 mm polyps in the ascending colon,                            removed with a  cold snare. Resected and retrieved.                           - One 8 mm polyp in the descending colon, removed                            with a hot snare. Resected and retrieved.                           - Diverticulosis in the recto-sigmoid colon, in the                            sigmoid colon, in the descending colon, in the                            transverse colon, at the hepatic flexure and in the                            ascending colon.                           - Internal hemorrhoids.                           - There was significant looping of the colon. Moderate Sedation:      Moderate (conscious) sedation was administered by the endoscopy nurse       and supervised by the endoscopist. The following parameters were       monitored: oxygen saturation, heart rate, blood pressure, and response       to care. Total physician intraservice time was 58 minutes. Recommendation:           - High fiber diet.                           - Continue present medications.                           -  Await pathology results.                           - Repeat colonoscopy is not recommended due to                            current age (33 years or older) for surveillance.                            NEEDS PEDS COLONOSCOPE IF SHE NEEDS A TCS IN THE                            FUTURE.                           - Patient has a contact number available for                            emergencies. The signs and symptoms of potential                            delayed complications were discussed with the                            patient. Return to normal activities tomorrow.                            Written discharge instructions were provided to the                            patient. Procedure Code(s):        --- Professional ---                           [id number], Colonoscopy, flexible; with removal of                            tumor(s), polyp(s), or other lesion(s) by snare                             technique                           G0500, Moderate sedation services provided by the                            same physician or other qualified health care                            professional performing a gastrointestinal                            endoscopic service that sedation supports,                            requiring the presence  of an independent trained                            observer to assist in the monitoring of the                            patient's level of consciousness and physiological                            status; initial 15 minutes of intra-service time;                            patient age 22 years or older (additional time may                            be reported with [phone], as appropriate)                           [phone], Moderate sedation services provided by the                            same physician or other qualified health care                            professional performing the diagnostic or                            therapeutic service that the sedation supports,                            requiring the presence of an independent trained                            observer to assist in the monitoring of the                            patient's level of consciousness and physiological                            status; each additional 15 minutes intraservice                            time (List separately in addition to code for                            primary service)                           [phone], Moderate sedation services provided by the                            same physician or other qualified health care  professional performing the diagnostic or                            therapeutic service that the sedation supports,                            requiring the presence of an independent trained                            observer to assist in the monitoring of the                             patient's level of consciousness and physiological                            status; each additional 15 minutes intraservice                            time (List separately in addition to code for                            primary service)                           [phone], Moderate sedation services provided by the                            same physician or other qualified health care                            professional performing the diagnostic or                            therapeutic service that the sedation supports,                            requiring the presence of an independent trained                            observer to assist in the monitoring of the                            patient's level of consciousness and physiological                            status; each additional 15 minutes intraservice                            time (List separately in addition to code for                            primary service) Diagnosis Code(s):        --- Professional ---  Z12.11, Encounter for screening for malignant                            neoplasm of colon                           D12.2, Benign neoplasm of ascending colon                           D12.4, Benign neoplasm of descending colon                           K64.8, Other hemorrhoids                           K57.30, Diverticulosis of large intestine without                            perforation or abscess without bleeding CPT copyright 2017 American Medical Association. All rights reserved. The codes documented in this report are preliminary and upon coder review may  be revised to meet current compliance requirements. Barney Drain, MD Barney Drain MD, MD 01/11/2018 11:42:45 AM This report has been signed electronically. Number of Addenda: 0

## 2018-01-14 ENCOUNTER — Telehealth: Payer: Self-pay | Admitting: Gastroenterology

## 2018-01-14 NOTE — Telephone Encounter (Signed)
Pt notified of results

## 2018-01-14 NOTE — Telephone Encounter (Signed)
PLEASE CALL PT. SHE HAD THREE SIMPLE ADENOMAS REMOVED.   DRINK WATER TO KEEP YOUR URINE LIGHT YELLOW.  FOLLOW A HIGH FIBER DIET. AVOID ITEMS THAT CAUSE BLOATING.   USE PREPARATION H FOUR TIMES  A DAY IF NEEDED TO RELIEVE RECTAL PAIN/PRESSURE/BLEEDING.

## 2018-01-23 ENCOUNTER — Ambulatory Visit (HOSPITAL_COMMUNITY)
Admission: RE | Admit: 2018-01-23 | Discharge: 2018-01-23 | Disposition: A | Discharge status: Home / Self Care | Payer: Medicare HMO | Source: Ambulatory Visit | Attending: Internal Medicine | Admitting: Internal Medicine

## 2018-01-23 DIAGNOSIS — Z1231 Encounter for screening mammogram for malignant neoplasm of breast: Secondary | ICD-10-CM

## 2018-01-28 DIAGNOSIS — C50919 Malignant neoplasm of unspecified site of unspecified female breast: Secondary | ICD-10-CM | POA: Diagnosis not present

## 2018-01-28 DIAGNOSIS — F419 Anxiety disorder, unspecified: Secondary | ICD-10-CM | POA: Diagnosis not present

## 2018-01-28 DIAGNOSIS — N390 Urinary tract infection, site not specified: Secondary | ICD-10-CM | POA: Diagnosis not present

## 2018-01-28 DIAGNOSIS — I10 Essential (primary) hypertension: Secondary | ICD-10-CM | POA: Diagnosis not present

## 2018-01-28 DIAGNOSIS — J449 Chronic obstructive pulmonary disease, unspecified: Secondary | ICD-10-CM | POA: Diagnosis not present

## 2018-01-28 DIAGNOSIS — E1165 Type 2 diabetes mellitus with hyperglycemia: Secondary | ICD-10-CM | POA: Diagnosis not present

## 2018-01-28 DIAGNOSIS — R300 Dysuria: Secondary | ICD-10-CM | POA: Diagnosis not present

## 2018-02-20 ENCOUNTER — Encounter (HOSPITAL_COMMUNITY): Payer: Self-pay | Admitting: Internal Medicine

## 2018-02-20 ENCOUNTER — Inpatient Hospital Stay (HOSPITAL_COMMUNITY): Payer: Medicare HMO | Attending: Internal Medicine | Admitting: Internal Medicine

## 2018-02-20 ENCOUNTER — Other Ambulatory Visit: Payer: Self-pay

## 2018-02-20 VITALS — BP 120/54 | HR 83 | Temp 98.0°F | Resp 18 | Wt 134.5 lb

## 2018-02-20 DIAGNOSIS — Z853 Personal history of malignant neoplasm of breast: Secondary | ICD-10-CM

## 2018-02-20 DIAGNOSIS — Z9011 Acquired absence of right breast and nipple: Secondary | ICD-10-CM | POA: Diagnosis not present

## 2018-02-20 DIAGNOSIS — F17210 Nicotine dependence, cigarettes, uncomplicated: Secondary | ICD-10-CM

## 2018-02-20 DIAGNOSIS — C50911 Malignant neoplasm of unspecified site of right female breast: Secondary | ICD-10-CM

## 2018-02-20 DIAGNOSIS — I10 Essential (primary) hypertension: Secondary | ICD-10-CM | POA: Diagnosis not present

## 2018-02-20 DIAGNOSIS — Z86711 Personal history of pulmonary embolism: Secondary | ICD-10-CM

## 2018-02-20 DIAGNOSIS — M8580 Other specified disorders of bone density and structure, unspecified site: Secondary | ICD-10-CM

## 2018-02-20 NOTE — Patient Instructions (Signed)
[facility] Discharge Instructions  Today you saw Dr. Higgs.    Thank you for choosing [facility] at [facility] to provide your oncology and hematology care.  To afford each patient quality time with our provider, please arrive at least 15 minutes before your scheduled appointment time.   If you have a lab appointment with the Cancer Center please come in thru the  Main Entrance and check in at the main information desk  You need to re-schedule your appointment should you arrive 10 or more minutes late.  We strive to give you quality time with our providers, and arriving late affects you and other patients whose appointments are after yours.  Also, if you no show three or more times for appointments you may be dismissed from the clinic at the providers discretion.     Again, thank you for choosing [facility].  Our hope is that these requests will decrease the amount of time that you wait before being seen by our physicians.       _____________________________________________________________  Should you have questions after your visit to [facility], please contact our office at [phone] between the hours of 8:30 a.m. and 4:30 p.m.  Voicemails left after 4:30 p.m. will not be returned until the following business day.  For prescription refill requests, have your pharmacy contact our office.       Resources For Cancer Patients and their Caregivers ? American Cancer Society: Can assist with transportation, wigs, general needs, runs Look Good Feel Better.        1-888-227-6333 ? Cancer Care: Provides financial assistance, online support groups, medication/co-pay assistance.  1-800-813-HOPE (4673) ? [facility] Assists [place] cancer patients and their families through emotional , educational and financial support.  336-427-4357 ? [place] DSS Where to apply for  food stamps, Medicaid and utility assistance. 336-342-1394 ? [facility]: Transportation to medical appointments. 336-347-2287 ? Social Security Administration: May apply for disability if have a Stage IV cancer. 336-342-7796 1-800-772-1213 ? [place] Aging, Disability and Transit Services: Assists with nutrition, care and transit needs. 336-349-2343  Cancer Center Support Programs:   > Cancer Support Group  2nd Tuesday of the month 1pm-2pm, Journey Room   > Creative Journey  3rd Tuesday of the month 1130am-1pm, Journey Room    

## 2018-02-20 NOTE — Progress Notes (Signed)
Diagnosis Infiltrating ductal carcinoma of right breast ([place]) - Plan: MM Digital Screening Unilat L  Staging Cancer Staging Infiltrating ductal carcinoma of breast ([place]) Staging form: Breast, AJCC 7th Edition - Clinical: Stage IA (T1b, N0, cM0) - Signed by Pieter Partridge, MD on 01/16/2011 - Pathologic: No stage assigned - Unsigned   Assessment and Plan:  1.  Stage I R breast cancer (T1B, N0,M0).  Pt is s/p R Modified radical mastectomy.  Pt was treated with Arimidex starting on 02/16/2009.  She completed therapy in 2015.    Pt is seen today for follow-up to go over mammogram.  Screening mammogram left breast done 01/23/2018 negative.  Pt is recommended for left screening mammogram in 12/2018 and will follow-up at that Time to go over results.    2.  Smoking.  Cessation is recommended.  Pt was followed by Dr. Luan Pulling and reports she had CT scans done in the past.  She reports she was seen at [facility] for follow-up. Follow-up with pulmonary or PCP as directed.    3.  PE.  This reportedly occurred after appendectomy in 1974.  She denies any SOB.  Pulse ox 98% on room ari.    4.  Osteopenia.  Review of chart shows last Dexa was in 2015.  She should continue calcium and vitamin D.  Follow-up with PCP for ongoing monitoring.    5.  Health maintenance.  Pt had colonoscopy done 01/11/2018 that showed several polyps.  Pathology returned as tubular adenomas.  No malignancy was identified. Follow-up with GI as directed.    Interval History:  Historical data obtained from the note dated 02/22/2017:  77 yr old female with Stage I R breast cancer (T1B, N0,M0).  Pt is s/p R Modified radical mastectomy.  She was treated with  Arimidex therapy starting on 02/16/2009. She finished her Arimidex therapy in 2015  Current Status:  Pt is seen today for follow-up.  She is here to go over mammogram.    Problem List Patient Active Problem List   Diagnosis Date Noted  . Special screening for malignant neoplasms, colon  [Z12.11]   . Pelvic relaxation due to enterocele, vaginal [N81.5] 10/31/2017  . Stroke (cerebrum) ([place]) [I63.9] 03/22/2016  . Tobacco use disorder [F17.200] 03/22/2016  . Diabetes mellitus type 2 in nonobese ([place]) [E11.9] 03/22/2016  . Benign essential HTN [I10] 03/22/2016  . Hyperlipidemia [E78.5] 03/22/2016  . Acute left-sided weakness [R53.1] 03/22/2016  . Osteopenia [M85.80] 01/25/2014  . Embolism - blood clot [I74.9]   . Infiltrating ductal carcinoma of breast ([place]) [C50.919] 01/16/2011  . Tendonitis [M77.9]   . DEQUERVAIN'S [M65.4] 08/10/2010    Past Medical History Past Medical History:  Diagnosis Date  . Breast CA ([place]) 01/16/2011  . Breast cancer ([place])    rt dx 2010; mastectomy only  . Diverticulitis   . Embolism - blood clot    left lung  . History of prolapse of bladder    patient has gellhorn pessary  . Hypertension   . Infiltrating ductal carcinoma of breast ([place]) 01/16/2011   Started Arimidex on 02/24/2009. Stage IB (T1b N0 M0) grade 1 infiltrating ductal carcinoma of the right breast, status post right modified radical mastectomy on 01/22/2009 for a 10 mm cancer, ER +100%, PR +98%, KI-67 marker was 9%, HER-2 negative, no LVI identified, and 11 lymph nodes were negative, all margins were clear. She had some associated DCIS, is now on Arimidex and she will take that for  . Stroke ([place])   .  Tendonitis 07/2010   shot of cortisone    Past Surgical History Past Surgical History:  Procedure Laterality Date  . ABDOMINAL HYSTERECTOMY  1987  . APPENDECTOMY  1974   blood clot in lung  . BREAST BIOPSY  1968   left  . COLONOSCOPY N/A 01/11/2018   Procedure: COLONOSCOPY;  Surgeon: Danie Binder, MD;  Location: [facility] ENDO SUITE;  Service: Endoscopy;  Laterality: N/A;  10:00  . MASTECTOMY MODIFIED RADICAL  01/22/2009   rt  . NASAL SINUS SURGERY  2002  . POLYPECTOMY  01/11/2018   Procedure: POLYPECTOMY;  Surgeon: Danie Binder, MD;  Location: [facility] ENDO SUITE;  Service:  Endoscopy;;  ascending; descending;  . TEAR DUCT PROBING    . tubal ligation  1972  . YAG LASER APPLICATION Right 03/09/1750   Procedure: YAG LASER APPLICATION;  Surgeon: Williams Che, MD;  Location: [facility] ORS;  Service: Ophthalmology;  Laterality: Right;    Family History Family History  Problem Relation Age of Onset  . Breast cancer Sister   . Breast cancer Maternal Grandmother   . Colon cancer Daughter   . Hypertension Paternal Grandfather   . Stroke Paternal Grandfather   . Other Paternal Grandmother        hardening of arteries  . Other Father        gun shot wound  . Cancer Sister   . Cancer Daughter   . Stroke Son   . Heart attack Son      Social History  reports that she has been smoking cigarettes.  She has a 31.00 pack-year smoking history. She has never used smokeless tobacco. She reports that she drinks alcohol. She reports that she does not use drugs.  Medications  Current Outpatient Medications:  .  albuterol (PROVENTIL HFA;VENTOLIN HFA) 108 (90 Base) MCG/ACT inhaler, Inhale 1 puff into the lungs every 6 (six) hours as needed for wheezing or shortness of breath., Disp: , Rfl:  .  amLODipine (NORVASC) 5 MG tablet, Take 5 mg by mouth daily., Disp: , Rfl:  .  aspirin 325 MG tablet, Take 1 tablet (325 mg total) by mouth daily., Disp: 30 tablet, Rfl: 3 .  Calcium Carbonate-Vitamin D (CALCIUM 500 + D PO), Take by mouth daily., Disp: , Rfl:  .  fluticasone (FLONASE) 50 MCG/ACT nasal spray, Place 2 sprays into both nostrils as needed. , Disp: , Rfl:  .  gabapentin (NEURONTIN) 300 MG capsule, Take 300 mg by mouth at bedtime. , Disp: , Rfl:  .  lovastatin (MEVACOR) 40 MG tablet, Take 40 mg by mouth at bedtime. , Disp: , Rfl:  .  metFORMIN (GLUCOPHAGE) 500 MG tablet, Take 500 mg by mouth 2 (two) times daily. , Disp: , Rfl:  .  metoprolol tartrate (LOPRESSOR) 25 MG tablet, Take 25 mg by mouth daily. , Disp: , Rfl:  .  metroNIDAZOLE (METROGEL) 0.75 % vaginal gel, Place 1  Applicatorful vaginally once a week. one applicatorful to vagina at bedtime weekly , as needed for vaginal discharge with pessary, Disp: 70 g, Rfl: 4 .  naphazoline-glycerin (CLEAR EYES) 0.012-0.2 % SOLN, Place 2 drops into both eyes daily as needed for irritation., Disp: , Rfl:  .  omeprazole (PRILOSEC) 20 MG capsule, Take 1 capsule by mouth daily., Disp: , Rfl:  .  tiotropium (SPIRIVA) 18 MCG inhalation capsule, Place 18 mcg into inhaler and inhale daily as needed., Disp: , Rfl:  .  TRADJENTA 5 MG TABS tablet, Take 5 mg by mouth  daily. , Disp: , Rfl:   Allergies Pollen extract  Review of Systems Review of Systems - Oncology ROS negative   Physical Exam  Vitals Wt Readings from Last 3 Encounters:  02/20/18 134 lb 8 oz (61 kg)  01/11/18 135 lb (61.2 kg)  10/31/17 135 lb (61.2 kg)   Temp Readings from Last 3 Encounters:  02/20/18 98 F (36.7 C) (Oral)  01/11/18 97.9 F (36.6 C) (Oral)  07/31/17 98.1 F (36.7 C) (Oral)   BP Readings from Last 3 Encounters:  02/20/18 (!) 120/54  01/11/18 123/62  10/31/17 136/70   Pulse Readings from Last 3 Encounters:  02/20/18 83  01/11/18 73  10/31/17 60   Constitutional: Well-developed, well-nourished, and in no distress.   HENT: Head: Normocephalic and atraumatic.  Mouth/Throat: No oropharyngeal exudate. Mucosa moist. Eyes: Pupils are equal, round, and reactive to light. Conjunctivae are normal. No scleral icterus.  Neck: Normal range of motion. Neck supple. No JVD present.  Cardiovascular: Normal rate, regular rhythm and normal heart sounds.  Exam reveals no gallop and no friction rub.   No murmur heard. Pulmonary/Chest: Effort normal and breath sounds normal. No respiratory distress. No wheezes.No rales.  Abdominal: Soft. Bowel sounds are normal. No distension. There is no tenderness. There is no guarding.  Musculoskeletal: No edema or tenderness.  Lymphadenopathy: No cervical, axillary or supraclavicular adenopathy.   Neurological: Alert and oriented to person, place, and time. No cranial nerve deficit.  Skin: Skin is warm and dry. No rash noted. No erythema. No pallor.  Psychiatric: Affect and judgment normal.  Breast exam:  Right mastectomy site.  No palpable nodules or evidence of chest wall recurrence.  Left breast shows no palpable dominant masses.    Labs No visits with results within 3 Day(s) from this visit.  Latest known visit with results is:  Admission on 01/11/2018, Discharged on 01/11/2018  Component Date Value Ref Range Status  . Glucose-Capillary 01/11/2018 154* 65 - 99 mg/dL Final     Pathology Orders Placed This Encounter  Procedures  . MM Digital Screening Unilat L    Standing Status:   Future    Standing Expiration Date:   02/20/2019    Order Specific Question:   Reason for Exam (SYMPTOM  OR DIAGNOSIS REQUIRED)    Answer:   right breast cancer    Order Specific Question:   Preferred imaging location?    Answer:   [facility]       Zoila Shutter MD

## 2018-02-26 ENCOUNTER — Ambulatory Visit (HOSPITAL_COMMUNITY): Payer: Medicare HMO | Admitting: Internal Medicine

## 2018-04-17 DIAGNOSIS — E1142 Type 2 diabetes mellitus with diabetic polyneuropathy: Secondary | ICD-10-CM | POA: Diagnosis not present

## 2018-04-17 DIAGNOSIS — N390 Urinary tract infection, site not specified: Secondary | ICD-10-CM | POA: Diagnosis not present

## 2018-04-17 DIAGNOSIS — R300 Dysuria: Secondary | ICD-10-CM | POA: Diagnosis not present

## 2018-04-22 DIAGNOSIS — E1142 Type 2 diabetes mellitus with diabetic polyneuropathy: Secondary | ICD-10-CM | POA: Diagnosis not present

## 2018-04-22 DIAGNOSIS — N390 Urinary tract infection, site not specified: Secondary | ICD-10-CM | POA: Diagnosis not present

## 2018-05-13 DIAGNOSIS — Z1331 Encounter for screening for depression: Secondary | ICD-10-CM | POA: Diagnosis not present

## 2018-05-13 DIAGNOSIS — M545 Low back pain: Secondary | ICD-10-CM | POA: Diagnosis not present

## 2018-05-13 DIAGNOSIS — Z23 Encounter for immunization: Secondary | ICD-10-CM | POA: Diagnosis not present

## 2018-05-13 DIAGNOSIS — K219 Gastro-esophageal reflux disease without esophagitis: Secondary | ICD-10-CM | POA: Diagnosis not present

## 2018-05-13 DIAGNOSIS — Z1389 Encounter for screening for other disorder: Secondary | ICD-10-CM | POA: Diagnosis not present

## 2018-05-13 DIAGNOSIS — F17210 Nicotine dependence, cigarettes, uncomplicated: Secondary | ICD-10-CM | POA: Diagnosis not present

## 2018-05-13 DIAGNOSIS — F17200 Nicotine dependence, unspecified, uncomplicated: Secondary | ICD-10-CM | POA: Diagnosis not present

## 2018-05-13 DIAGNOSIS — I10 Essential (primary) hypertension: Secondary | ICD-10-CM | POA: Diagnosis not present

## 2018-05-13 DIAGNOSIS — E785 Hyperlipidemia, unspecified: Secondary | ICD-10-CM | POA: Diagnosis not present

## 2018-05-13 DIAGNOSIS — J449 Chronic obstructive pulmonary disease, unspecified: Secondary | ICD-10-CM | POA: Diagnosis not present

## 2018-05-13 DIAGNOSIS — J40 Bronchitis, not specified as acute or chronic: Secondary | ICD-10-CM | POA: Diagnosis not present

## 2018-05-13 DIAGNOSIS — E119 Type 2 diabetes mellitus without complications: Secondary | ICD-10-CM | POA: Diagnosis not present

## 2018-05-13 DIAGNOSIS — E1142 Type 2 diabetes mellitus with diabetic polyneuropathy: Secondary | ICD-10-CM | POA: Diagnosis not present

## 2018-05-13 DIAGNOSIS — C50919 Malignant neoplasm of unspecified site of unspecified female breast: Secondary | ICD-10-CM | POA: Diagnosis not present

## 2018-05-13 DIAGNOSIS — E039 Hypothyroidism, unspecified: Secondary | ICD-10-CM | POA: Diagnosis not present

## 2018-05-21 ENCOUNTER — Other Ambulatory Visit (HOSPITAL_COMMUNITY): Payer: Self-pay | Admitting: Internal Medicine

## 2018-05-21 DIAGNOSIS — Z780 Asymptomatic menopausal state: Secondary | ICD-10-CM

## 2018-05-30 DIAGNOSIS — H524 Presbyopia: Secondary | ICD-10-CM | POA: Diagnosis not present

## 2018-06-03 ENCOUNTER — Ambulatory Visit (HOSPITAL_COMMUNITY)
Admission: RE | Admit: 2018-06-03 | Discharge: 2018-06-03 | Disposition: A | Discharge status: Home / Self Care | Payer: Medicare HMO | Source: Ambulatory Visit | Attending: Internal Medicine | Admitting: Internal Medicine

## 2018-06-03 DIAGNOSIS — Z780 Asymptomatic menopausal state: Secondary | ICD-10-CM | POA: Diagnosis not present

## 2018-06-03 DIAGNOSIS — M85852 Other specified disorders of bone density and structure, left thigh: Secondary | ICD-10-CM | POA: Diagnosis not present

## 2018-06-03 DIAGNOSIS — M810 Age-related osteoporosis without current pathological fracture: Secondary | ICD-10-CM | POA: Diagnosis not present

## 2018-08-14 DIAGNOSIS — J449 Chronic obstructive pulmonary disease, unspecified: Secondary | ICD-10-CM | POA: Diagnosis not present

## 2018-08-14 DIAGNOSIS — S8012XD Contusion of left lower leg, subsequent encounter: Secondary | ICD-10-CM | POA: Diagnosis not present

## 2018-08-14 DIAGNOSIS — I10 Essential (primary) hypertension: Secondary | ICD-10-CM | POA: Diagnosis not present

## 2018-08-14 DIAGNOSIS — F17210 Nicotine dependence, cigarettes, uncomplicated: Secondary | ICD-10-CM | POA: Diagnosis not present

## 2018-11-12 DIAGNOSIS — G8190 Hemiplegia, unspecified affecting unspecified side: Secondary | ICD-10-CM | POA: Diagnosis not present

## 2018-11-12 DIAGNOSIS — I10 Essential (primary) hypertension: Secondary | ICD-10-CM | POA: Diagnosis not present

## 2018-11-12 DIAGNOSIS — J449 Chronic obstructive pulmonary disease, unspecified: Secondary | ICD-10-CM | POA: Diagnosis not present

## 2018-12-04 DIAGNOSIS — M549 Dorsalgia, unspecified: Secondary | ICD-10-CM | POA: Diagnosis not present

## 2018-12-04 DIAGNOSIS — M542 Cervicalgia: Secondary | ICD-10-CM | POA: Diagnosis not present

## 2018-12-13 ENCOUNTER — Ambulatory Visit (HOSPITAL_COMMUNITY)
Admission: RE | Admit: 2018-12-13 | Discharge: 2018-12-13 | Disposition: A | Discharge status: Home / Self Care | Payer: Medicare HMO | Source: Ambulatory Visit | Attending: Internal Medicine | Admitting: Internal Medicine

## 2018-12-13 ENCOUNTER — Other Ambulatory Visit (HOSPITAL_COMMUNITY): Payer: Self-pay | Admitting: Internal Medicine

## 2018-12-13 ENCOUNTER — Other Ambulatory Visit: Payer: Self-pay

## 2018-12-13 DIAGNOSIS — M25512 Pain in left shoulder: Secondary | ICD-10-CM | POA: Insufficient documentation

## 2018-12-13 DIAGNOSIS — M19012 Primary osteoarthritis, left shoulder: Secondary | ICD-10-CM | POA: Diagnosis not present

## 2018-12-25 ENCOUNTER — Other Ambulatory Visit: Payer: Self-pay

## 2018-12-25 ENCOUNTER — Ambulatory Visit: Payer: Medicare HMO | Admitting: Orthopedic Surgery

## 2018-12-25 ENCOUNTER — Encounter: Payer: Self-pay | Admitting: Orthopedic Surgery

## 2018-12-25 VITALS — BP 135/70 | HR 92 | Temp 97.3°F | Ht 65.0 in | Wt 134.0 lb

## 2018-12-25 DIAGNOSIS — M25511 Pain in right shoulder: Secondary | ICD-10-CM | POA: Diagnosis not present

## 2018-12-25 MED ORDER — IBUPROFEN 800 MG PO TABS: 800.0000 mg | ORAL_TABLET | Freq: Three times a day (TID) | ORAL | 0 refills | Status: DC | PRN

## 2018-12-25 MED ORDER — TIZANIDINE HCL 4 MG PO TABS: 4.0000 mg | ORAL_TABLET | Freq: Four times a day (QID) | ORAL | 0 refills | Status: DC | PRN

## 2018-12-25 NOTE — Patient Instructions (Signed)
Trigger Point Injection Trigger points are areas where you have pain. A trigger point injection is a shot given in the trigger point to help relieve pain for a few days to a few months. Common places for trigger points include:  The neck.  The shoulders.  The upper back.  The lower back. A trigger point injection will not cure long-lasting (chronic) pain permanently. These injections do not always work for every person, but for some people they can help to relieve pain for a few days to a few months. Tell a health care provider about:  Any allergies you have.  All medicines you are taking, including vitamins, herbs, eye drops, creams, and over-the-counter medicines.  Any problems you or family members have had with anesthetic medicines.  Any blood disorders you have.  Any surgeries you have had.  Any medical conditions you have. What are the risks? Generally, this is a safe procedure. However, problems may occur, including:  Infection.  Bleeding.  Allergic reaction to the injected medicine.  Irritation of the skin around the injection site. What happens before the procedure?  Ask your health care provider about changing or stopping your regular medicines. This is especially important if you are taking diabetes medicines or blood thinners. What happens during the procedure?  Your health care provider will feel for trigger points. A marker may be used to circle the area for the injection.  The skin over the trigger point will be washed with a germ-killing (antiseptic) solution.  A thin needle is used for the shot. You may feel pain or a twitching feeling when the needle enters the trigger point.  A numbing solution may be injected into the trigger point. Sometimes a medicine to keep down swelling, redness, and warmth (inflammation) is also injected.  Your health care provider may move the needle around the area where the trigger point is located until the tightness and  twitching goes away.  After the injection, your health care provider may put gentle pressure over the injection site.  The injection site will be covered with a bandage (dressing). The procedure may vary among health care providers and hospitals. What happens after the procedure?  The dressing can be taken off in a few hours or as told by your health care provider.  You may feel sore and stiff for 1-2 days. This information is not intended to replace advice given to you by your health care provider. Make sure you discuss any questions you have with your health care provider. Document Released: 07/06/2011 Document Revised: 03/19/2016 Document Reviewed: 01/04/2015 Elsevier Interactive Patient Education  2019 [employer].

## 2018-12-25 NOTE — Progress Notes (Signed)
NEW PROBLEM OFFICE VISIT  Chief Complaint  Patient presents with  . Shoulder Pain    Lt shoulder pain for 5 weeks    Pain located left shoulder medial border of scapula Sharp 10 out of 10 5 weeks Unrelieved with baclofen naproxen  78 year old female atraumatic onset of posterior medial shoulder pain located over the medial scapula does not affect range of motion   Review of Systems  Respiratory: Positive for cough and shortness of breath.   Musculoskeletal: Positive for back pain and neck pain.  All other systems reviewed and are negative.    Past Medical History:  Diagnosis Date  . Breast CA ([place]) 01/16/2011  . Breast cancer ([place])    rt dx 2010; mastectomy only  . Diverticulitis   . Embolism - blood clot    left lung  . History of prolapse of bladder    patient has gellhorn pessary  . Hypertension   . Infiltrating ductal carcinoma of breast ([place]) 01/16/2011   Started Arimidex on 02/24/2009. Stage IB (T1b N0 M0) grade 1 infiltrating ductal carcinoma of the right breast, status post right modified radical mastectomy on 01/22/2009 for a 10 mm cancer, ER +100%, PR +98%, KI-67 marker was 9%, HER-2 negative, no LVI identified, and 11 lymph nodes were negative, all margins were clear. She had some associated DCIS, is now on Arimidex and she will take that for  . Stroke ([place])   . Tendonitis 07/2010   shot of cortisone    Past Surgical History:  Procedure Laterality Date  . ABDOMINAL HYSTERECTOMY  1987  . APPENDECTOMY  1974   blood clot in lung  . BREAST BIOPSY  1968   left  . COLONOSCOPY N/A 01/11/2018   Procedure: COLONOSCOPY;  Surgeon: Danie Binder, MD;  [facility];  Service: Endoscopy;  Laterality: N/A;  10:00  . MASTECTOMY MODIFIED RADICAL  01/22/2009   rt  . NASAL SINUS SURGERY  2002  . POLYPECTOMY  01/11/2018   Procedure: POLYPECTOMY;  Surgeon: Danie Binder, MD;  Location: [facility];  Service: Endoscopy;;  ascending; descending;  . TEAR DUCT  PROBING    . tubal ligation  1972  . YAG LASER APPLICATION Right 3/35/4562   Procedure: YAG LASER APPLICATION;  Surgeon: Williams Che, MD;  Location: [facility];  Service: Ophthalmology;  Laterality: Right;    Family History  Problem Relation Age of Onset  . Breast cancer Sister   . Breast cancer Maternal Grandmother   . Colon cancer Daughter   . Hypertension Paternal Grandfather   . Stroke Paternal Grandfather   . Other Paternal Grandmother        hardening of arteries  . Other Father        gun shot wound  . Cancer Sister   . Cancer Daughter   . Stroke Son   . Heart attack Son    Social History   Tobacco Use  . Smoking status: Current Every Day Smoker    Packs/day: 0.50    Years: 62.00    Pack years: 31.00    Types: Cigarettes  . Smokeless tobacco: Never Used  Substance Use Topics  . Alcohol use: Yes    Comment: occ  . Drug use: No    Allergies  Allergen Reactions  . Pollen Extract Other (See Comments)    Runny nose, watery eyes and ears get stopped up    Current Meds  Medication Sig  . albuterol (PROVENTIL HFA;VENTOLIN HFA) 108 (90  Base) MCG/ACT inhaler Inhale 1 puff into the lungs every 6 (six) hours as needed for wheezing or shortness of breath.  Marland Kitchen amLODipine (NORVASC) 5 MG tablet Take 5 mg by mouth daily.  Marland Kitchen aspirin 325 MG tablet Take 1 tablet (325 mg total) by mouth daily.  . Calcium Carbonate-Vitamin D (CALCIUM 500 + D PO) Take by mouth daily.  . fluticasone (FLONASE) 50 MCG/ACT nasal spray Place 2 sprays into both nostrils as needed.   . gabapentin (NEURONTIN) 300 MG capsule Take 300 mg by mouth at bedtime.   . lovastatin (MEVACOR) 40 MG tablet Take 40 mg by mouth at bedtime.   . metFORMIN (GLUCOPHAGE) 500 MG tablet Take 500 mg by mouth 2 (two) times daily.   . metoprolol tartrate (LOPRESSOR) 25 MG tablet Take 25 mg by mouth daily.   . metroNIDAZOLE (METROGEL) 0.75 % vaginal gel Place 1 Applicatorful vaginally once a week. one applicatorful to vagina at  bedtime weekly , as needed for vaginal discharge with pessary  . naphazoline-glycerin (CLEAR EYES) 0.012-0.2 % SOLN Place 2 drops into both eyes daily as needed for irritation.  Marland Kitchen omeprazole (PRILOSEC) 20 MG capsule Take 1 capsule by mouth daily.  Marland Kitchen tiotropium (SPIRIVA) 18 MCG inhalation capsule Place 18 mcg into inhaler and inhale daily as needed.  . TRADJENTA 5 MG TABS tablet Take 5 mg by mouth daily.     BP 135/70   Pulse 92   Ht 5' 5"  (1.651 m)   Wt 134 lb (60.8 kg)   BMI 22.30 kg/m   Physical Exam Vitals signs and nursing note reviewed.  Constitutional:      Appearance: Normal appearance.     Comments: Small frame thin ectomorphic body habitus  Neurological:     Mental Status: She is alert and oriented to person, place, and time.  Psychiatric:        Mood and Affect: Mood normal.     Right Shoulder Exam  Right shoulder exam is normal.  Tenderness  The patient is experiencing no tenderness.  Range of Motion  The patient has normal right shoulder ROM.  Muscle Strength  The patient has normal right shoulder strength.  Tests  Apprehension: negative  Other  Erythema: absent Sensation: normal Pulse: present   Left Shoulder Exam  Left shoulder exam is normal.  Tenderness  Left shoulder tenderness location: Medial border of scapula.  Range of Motion  The patient has normal left shoulder ROM.  Muscle Strength  The patient has normal left shoulder strength.  Tests  Apprehension: negative  Other  Erythema: absent Sensation: normal Pulse: present         MEDICAL DECISION SECTION  Xrays were done at [facility]   My independent reading of xrays:  3 views of the shoulder no fracture dislocation or major degenerative arthritis there may be some inferior marginal osteophytes  X-ray report CLINICAL DATA:  Severe posterior shoulder pain over the last 2 months.   EXAM: LEFT SHOULDER - 2+ VIEW   COMPARISON:  None.   FINDINGS: Mild osteoarthritis of  the glenohumeral joint with inferior osteophytes. Normal humeral acromial distance. AC joint appears normal. Regional ribs appear normal.   IMPRESSION: Mild osteoarthritis of the glenohumeral joint.     Electronically Signed   By: Nelson Chimes M.D.   On: 12/13/2018 14:22  Encounter Diagnosis  Name Primary?  . Trigger point of right shoulder region Yes    PLAN: (Rx., injectx, surgery, frx, mri/ct) PT Trigger point injection  TRIGGER  POINT INJECTION  Patient consented verbally for injection of the left  posterior/MEDIAL scapula. Timeout confirmed the site of injection A steroid injection was performed at inferior border of the left  scapula at the point of maximal tenderness using 1% plain Lidocaine and 40 mg of Depo-Medrol. This was well tolerated.  Meds ordered this encounter  Medications  . ibuprofen (ADVIL) 800 MG tablet    Sig: Take 1 tablet (800 mg total) by mouth every 8 (eight) hours as needed.    Dispense:  30 tablet    Refill:  0  . tiZANidine (ZANAFLEX) 4 MG tablet    Sig: Take 1 tablet (4 mg total) by mouth every 6 (six) hours as needed for muscle spasms.    Dispense:  30 tablet    Refill:  0    Arther Abbott, MD  12/25/2018 4:29 PM

## 2018-12-31 DIAGNOSIS — M6281 Muscle weakness (generalized): Secondary | ICD-10-CM | POA: Diagnosis not present

## 2018-12-31 DIAGNOSIS — R296 Repeated falls: Secondary | ICD-10-CM | POA: Diagnosis not present

## 2018-12-31 DIAGNOSIS — I10 Essential (primary) hypertension: Secondary | ICD-10-CM | POA: Diagnosis not present

## 2018-12-31 DIAGNOSIS — M546 Pain in thoracic spine: Secondary | ICD-10-CM | POA: Diagnosis not present

## 2018-12-31 DIAGNOSIS — M25512 Pain in left shoulder: Secondary | ICD-10-CM | POA: Diagnosis not present

## 2018-12-31 DIAGNOSIS — M2560 Stiffness of unspecified joint, not elsewhere classified: Secondary | ICD-10-CM | POA: Diagnosis not present

## 2018-12-31 DIAGNOSIS — M25511 Pain in right shoulder: Secondary | ICD-10-CM | POA: Diagnosis not present

## 2018-12-31 DIAGNOSIS — F17210 Nicotine dependence, cigarettes, uncomplicated: Secondary | ICD-10-CM | POA: Diagnosis not present

## 2019-01-02 DIAGNOSIS — I10 Essential (primary) hypertension: Secondary | ICD-10-CM | POA: Diagnosis not present

## 2019-01-02 DIAGNOSIS — M791 Myalgia, unspecified site: Secondary | ICD-10-CM | POA: Diagnosis not present

## 2019-01-02 DIAGNOSIS — M25512 Pain in left shoulder: Secondary | ICD-10-CM | POA: Diagnosis not present

## 2019-01-07 DIAGNOSIS — M546 Pain in thoracic spine: Secondary | ICD-10-CM | POA: Diagnosis not present

## 2019-01-07 DIAGNOSIS — M6281 Muscle weakness (generalized): Secondary | ICD-10-CM | POA: Diagnosis not present

## 2019-01-07 DIAGNOSIS — R296 Repeated falls: Secondary | ICD-10-CM | POA: Diagnosis not present

## 2019-01-07 DIAGNOSIS — I10 Essential (primary) hypertension: Secondary | ICD-10-CM | POA: Diagnosis not present

## 2019-01-07 DIAGNOSIS — M2560 Stiffness of unspecified joint, not elsewhere classified: Secondary | ICD-10-CM | POA: Diagnosis not present

## 2019-01-07 DIAGNOSIS — M25511 Pain in right shoulder: Secondary | ICD-10-CM | POA: Diagnosis not present

## 2019-01-07 DIAGNOSIS — F17210 Nicotine dependence, cigarettes, uncomplicated: Secondary | ICD-10-CM | POA: Diagnosis not present

## 2019-01-07 DIAGNOSIS — M25512 Pain in left shoulder: Secondary | ICD-10-CM | POA: Diagnosis not present

## 2019-01-09 DIAGNOSIS — M25512 Pain in left shoulder: Secondary | ICD-10-CM | POA: Diagnosis not present

## 2019-01-09 DIAGNOSIS — I10 Essential (primary) hypertension: Secondary | ICD-10-CM | POA: Diagnosis not present

## 2019-01-09 DIAGNOSIS — R296 Repeated falls: Secondary | ICD-10-CM | POA: Diagnosis not present

## 2019-01-09 DIAGNOSIS — M546 Pain in thoracic spine: Secondary | ICD-10-CM | POA: Diagnosis not present

## 2019-01-09 DIAGNOSIS — F17210 Nicotine dependence, cigarettes, uncomplicated: Secondary | ICD-10-CM | POA: Diagnosis not present

## 2019-01-09 DIAGNOSIS — M6281 Muscle weakness (generalized): Secondary | ICD-10-CM | POA: Diagnosis not present

## 2019-01-09 DIAGNOSIS — M2560 Stiffness of unspecified joint, not elsewhere classified: Secondary | ICD-10-CM | POA: Diagnosis not present

## 2019-01-09 DIAGNOSIS — M25511 Pain in right shoulder: Secondary | ICD-10-CM | POA: Diagnosis not present

## 2019-01-14 ENCOUNTER — Telehealth: Payer: Self-pay | Admitting: Radiology

## 2019-01-14 NOTE — Telephone Encounter (Signed)
Patients daughter states meds not helping Ibuprofen and Tizanidine / she has not gone for therapy yet.   She is also taking other medications but her daughter will call me back and let me know what she is taking  Daughter states she herself is using Hydrocodone wants mom to have Rx for this, I have advised Dr Aline Brochure does not prescribe this type of medication, but in order for [place] to provide anything, need to know what she is currently taking  To you FYI

## 2019-01-15 ENCOUNTER — Telehealth: Payer: Self-pay | Admitting: Orthopedic Surgery

## 2019-01-15 DIAGNOSIS — M546 Pain in thoracic spine: Secondary | ICD-10-CM | POA: Diagnosis not present

## 2019-01-15 DIAGNOSIS — M6281 Muscle weakness (generalized): Secondary | ICD-10-CM | POA: Diagnosis not present

## 2019-01-15 DIAGNOSIS — R296 Repeated falls: Secondary | ICD-10-CM | POA: Diagnosis not present

## 2019-01-15 DIAGNOSIS — M25511 Pain in right shoulder: Secondary | ICD-10-CM | POA: Diagnosis not present

## 2019-01-15 DIAGNOSIS — I10 Essential (primary) hypertension: Secondary | ICD-10-CM | POA: Diagnosis not present

## 2019-01-15 DIAGNOSIS — F17210 Nicotine dependence, cigarettes, uncomplicated: Secondary | ICD-10-CM | POA: Diagnosis not present

## 2019-01-15 DIAGNOSIS — M25512 Pain in left shoulder: Secondary | ICD-10-CM | POA: Diagnosis not present

## 2019-01-15 DIAGNOSIS — M2560 Stiffness of unspecified joint, not elsewhere classified: Secondary | ICD-10-CM | POA: Diagnosis not present

## 2019-01-15 NOTE — Telephone Encounter (Signed)
    Carole Civil, MD to Me Note   Opioids not indicated.  I did not find a problem with her shoulder   Speak with her doctor for further instructions

## 2019-01-15 NOTE — Telephone Encounter (Signed)
Opioids not indicated.  I did not find a problem with her shoulder   Speak with her doctor for further instructions

## 2019-01-15 NOTE — Telephone Encounter (Signed)
LM for her to call me back.

## 2019-01-15 NOTE — Telephone Encounter (Signed)
I will advise when her daughter calls back.

## 2019-01-15 NOTE — Telephone Encounter (Signed)
Patient's daughter came by office to let you know that her mom takes Gabapentin 500 mg.   PATIENT USES [facility]

## 2019-01-16 NOTE — Telephone Encounter (Signed)
[name] called this morning requesting pain medication and/or asking what she can take for her pain.  I told her that Amy did call yesterday and left a message for her to call back.  I then told her I would have to have them call her back again.  Would you call her again please?

## 2019-01-16 NOTE — Telephone Encounter (Signed)
I have called her to advise.

## 2019-01-22 ENCOUNTER — Ambulatory Visit (INDEPENDENT_AMBULATORY_CARE_PROVIDER_SITE_OTHER): Payer: Medicare HMO | Admitting: Orthopedic Surgery

## 2019-01-22 ENCOUNTER — Other Ambulatory Visit: Payer: Self-pay

## 2019-01-22 ENCOUNTER — Ambulatory Visit (INDEPENDENT_AMBULATORY_CARE_PROVIDER_SITE_OTHER): Payer: Medicare HMO

## 2019-01-22 VITALS — BP 149/80 | HR 86 | Temp 97.9°F | Ht 66.0 in | Wt 135.0 lb

## 2019-01-22 DIAGNOSIS — M25512 Pain in left shoulder: Secondary | ICD-10-CM | POA: Diagnosis not present

## 2019-01-22 DIAGNOSIS — M2560 Stiffness of unspecified joint, not elsewhere classified: Secondary | ICD-10-CM | POA: Diagnosis not present

## 2019-01-22 DIAGNOSIS — I10 Essential (primary) hypertension: Secondary | ICD-10-CM | POA: Diagnosis not present

## 2019-01-22 DIAGNOSIS — M6281 Muscle weakness (generalized): Secondary | ICD-10-CM | POA: Diagnosis not present

## 2019-01-22 DIAGNOSIS — G5622 Lesion of ulnar nerve, left upper limb: Secondary | ICD-10-CM | POA: Diagnosis not present

## 2019-01-22 DIAGNOSIS — M25511 Pain in right shoulder: Secondary | ICD-10-CM | POA: Diagnosis not present

## 2019-01-22 DIAGNOSIS — M546 Pain in thoracic spine: Secondary | ICD-10-CM | POA: Diagnosis not present

## 2019-01-22 DIAGNOSIS — R296 Repeated falls: Secondary | ICD-10-CM | POA: Diagnosis not present

## 2019-01-22 DIAGNOSIS — F17210 Nicotine dependence, cigarettes, uncomplicated: Secondary | ICD-10-CM | POA: Diagnosis not present

## 2019-01-22 NOTE — Patient Instructions (Signed)
Cubital Tunnel Syndrome  Cubital tunnel syndrome is a condition that causes pain and weakness of the forearm and hand. It happens when one of the nerves that runs along the inside of the elbow joint (ulnar nerve) becomes irritated. This condition is usually caused by repeated arm motions that are done during sports or work-related activities. What are the causes? This condition may be caused by:  Increased pressure on the ulnar nerve at the elbow, arm, or forearm. This can result from: ? Irritation caused by repeated elbow bending. ? Poorly healed elbow fractures. ? Tumors in the elbow. These are usually noncancerous (benign). ? Scar tissue that develops in the elbow after an injury. ? Bony growths (spurs) near the ulnar nerve.  Stretching of the nerve due to loose elbow ligaments.  Trauma to the nerve at the elbow. What increases the risk? The following factors may make you more likely to develop this condition:  Doing manual labor that requires frequent bending of the elbow.  Playing sports that include repeated or strenuous throwing motions, such as baseball.  Playing contact sports, such as football or lacrosse.  Not warming up properly before activities.  Having diabetes.  Having an underactive thyroid (hypothyroidism). What are the signs or symptoms? Symptoms of this condition include:  Clumsiness or weakness of the hand.  Tenderness of the inner elbow.  Aching or soreness of the inner elbow, forearm, or fingers, especially the little finger or the ring finger.  Increased pain when forcing the elbow to bend.  Reduced control when throwing objects.  Tingling, numbness, or a burning feeling inside the forearm or in part of the hand or fingers, especially the little finger or the ring finger.  Sharp pains that shoot from the elbow down to the wrist and hand.  The inability to grip or pinch hard. How is this diagnosed? This condition is diagnosed based on:  Your  symptoms and medical history. Your health care provider will also ask for details about any injury.  A physical exam. You may also have tests, including:  Electromyogram (EMG). This test measures electrical signals sent by your nerves into the muscles.  Nerve conduction study. This test measures how well electrical signals pass through your nerves.  Imaging tests, such as X-rays, ultrasound, and MRI. These tests check for possible causes of your condition. How is this treated? This condition may be treated by:  Stopping the activities that are causing your symptoms to get worse.  Icing and taking medicines to reduce pain and swelling.  Wearing a splint to prevent your elbow from bending, or wearing an elbow pad where the ulnar nerve is closest to the skin.  Working with a physical therapist in less severe cases. This may help to: ? Decrease your symptoms. ? Improve the strength and range of motion of your elbow, forearm, and hand. If these treatments do not help, surgery may be needed. Follow these instructions at home: If you have a splint:  Wear the splint as told by your health care provider. Remove it only as told by your health care provider.  Loosen the splint if your fingers tingle, become numb, or turn cold and blue.  Keep the splint clean.  If the splint is not waterproof: ? Do not let it get wet. ? Cover it with a watertight covering when you take a bath or shower. Managing pain, stiffness, and swelling   If directed, put ice on the injured area: ? Put ice in a plastic bag. ?   Place a towel between your skin and the bag. ? Leave the ice on for 20 minutes, 2-3 times a day.  Move your fingers often to avoid stiffness and to lessen swelling.  Raise (elevate) the injured area above the level of your heart while you are sitting or lying down. General instructions  Take over-the-counter and prescription medicines only as told by your health care provider.  Do any  exercise or physical therapy as told by your health care provider.  Do not drive or use heavy machinery while taking prescription pain medicine.  If you were given an elbow pad, wear it as told by your health care provider.  Keep all follow-up visits as told by your health care provider. This is important. Contact a health care provider if:  Your symptoms get worse.  Your symptoms do not get better with treatment.  You have new pain.  Your hand on the injured side feels numb or cold. Summary  Cubital tunnel syndrome is a condition that causes pain and weakness of the forearm and hand.  You are more likely to develop this condition if you do work or play sports that involve repeated arm movements.  This condition is often treated by stopping repetitive activities, applying ice, and using anti-inflammatory medicines.  In rare cases, surgery may be needed. This information is not intended to replace advice given to you by your health care provider. Make sure you discuss any questions you have with your health care provider. Document Released: 07/17/2005 Document Revised: 12/03/2017 Document Reviewed: 12/03/2017 Elsevier Interactive Patient Education  2019 [employer]vier Inc.  

## 2019-01-22 NOTE — Progress Notes (Signed)
NEW PROBLEM OFFICE VISIT  Chief Complaint  Patient presents with  . Arm Pain    Left hand and arm pain and numbness.    78 she comes in with a new problem she has pain in the left elbow for 4 weeks no treatment wakes her up at night,  numbness starts at the elbow radiates to the small finger and ring finger of the left hand denies any trauma.  She reports decreased grip strength and weakness in her left hand   Review of Systems  Constitutional: Negative for fever.  Respiratory: Negative for shortness of breath.   Cardiovascular: Negative for chest pain.  Skin: Negative.   Neurological: Positive for tingling, sensory change and weakness. Negative for tremors.     Past Medical History:  Diagnosis Date  . Breast CA ([place]) 01/16/2011  . Breast cancer ([place])    rt dx 2010; mastectomy only  . Diverticulitis   . Embolism - blood clot    left lung  . History of prolapse of bladder    patient has gellhorn pessary  . Hypertension   . Infiltrating ductal carcinoma of breast ([place]) 01/16/2011   Started Arimidex on 02/24/2009. Stage IB (T1b N0 M0) grade 1 infiltrating ductal carcinoma of the right breast, status post right modified radical mastectomy on 01/22/2009 for a 10 mm cancer, ER +100%, PR +98%, KI-67 marker was 9%, HER-2 negative, no LVI identified, and 11 lymph nodes were negative, all margins were clear. She had some associated DCIS, is now on Arimidex and she will take that for  . Stroke ([place])   . Tendonitis 07/2010   shot of cortisone    Past Surgical History:  Procedure Laterality Date  . ABDOMINAL HYSTERECTOMY  1987  . APPENDECTOMY  1974   blood clot in lung  . BREAST BIOPSY  1968   left  . COLONOSCOPY N/A 01/11/2018   Procedure: COLONOSCOPY;  Surgeon: Danie Binder, MD;  [facility];  Service: Endoscopy;  Laterality: N/A;  10:00  . MASTECTOMY MODIFIED RADICAL  01/22/2009   rt  . NASAL SINUS SURGERY  2002  . POLYPECTOMY  01/11/2018   Procedure: POLYPECTOMY;   Surgeon: Danie Binder, MD;  [facility];  Service: Endoscopy;;  ascending; descending;  . TEAR DUCT PROBING    . tubal ligation  1972  . YAG LASER APPLICATION Right 02/03/8674   Procedure: YAG LASER APPLICATION;  Surgeon: Williams Che, MD;  Location: [facility];  Service: Ophthalmology;  Laterality: Right;    Family History  Problem Relation Age of Onset  . Breast cancer Sister   . Breast cancer Maternal Grandmother   . Colon cancer Daughter   . Hypertension Paternal Grandfather   . Stroke Paternal Grandfather   . Other Paternal Grandmother        hardening of arteries  . Other Father        gun shot wound  . Cancer Sister   . Cancer Daughter   . Stroke Son   . Heart attack Son    Social History   Tobacco Use  . Smoking status: Current Every Day Smoker    Packs/day: 0.50    Years: 62.00    Pack years: 31.00    Types: Cigarettes  . Smokeless tobacco: Never Used  Substance Use Topics  . Alcohol use: Yes    Comment: occ  . Drug use: No    Allergies  Allergen Reactions  . Pollen Extract Other (See Comments)  Runny nose, watery eyes and ears get stopped up  . Red Dye     No outpatient medications have been marked as taking for the 01/22/19 encounter (Office Visit) with Carole Civil, MD.    BP (!) 149/80   Pulse 86   Ht 5' 6"  (1.676 m)   Wt 135 lb (61.2 kg)   BMI 21.79 kg/m   Physical Exam Vitals signs and nursing note reviewed.  Constitutional:      Appearance: Normal appearance.  Neurological:     Mental Status: She is alert and oriented to person, place, and time.  Psychiatric:        Mood and Affect: Mood normal.     Ortho Exam  Right elbow no tenderness normal range of motion ligaments stable muscle tone normal  Left elbow tenderness at the elbow positive compression test at the elbow negative Tinel's at the elbow full range of motion of the elbow ligaments stable weaknesses in grip on the left compared to the right color  capillary refill epitrochlear lymph nodes all normal skin normal  MEDICAL DECISION SECTION  Xrays were done at [place]   My independent reading of xrays:  See my dictated report x-ray shows no fracture dislocation or bone problem to explain her symptoms  Encounter Diagnosis  Name Primary?  . Cubital tunnel syndrome on left Yes    PLAN: (Rx., injectx, surgery, frx, mri/ct) 20-30 degrees splint for nighttime Continue gabapentin and ibuprofen Follow-up 6 weeks, no improvement nerve study   No orders of the defined types were placed in this encounter.   Arther Abbott, MD  01/22/2019 2:20 PM

## 2019-02-05 ENCOUNTER — Other Ambulatory Visit: Payer: Self-pay

## 2019-02-05 ENCOUNTER — Ambulatory Visit (HOSPITAL_COMMUNITY)
Admission: RE | Admit: 2019-02-05 | Discharge: 2019-02-05 | Disposition: A | Discharge status: Home / Self Care | Payer: Medicare HMO | Source: Ambulatory Visit | Attending: Internal Medicine | Admitting: Internal Medicine

## 2019-02-05 ENCOUNTER — Other Ambulatory Visit (HOSPITAL_COMMUNITY): Payer: Self-pay | Admitting: Hematology

## 2019-02-05 DIAGNOSIS — Z1231 Encounter for screening mammogram for malignant neoplasm of breast: Secondary | ICD-10-CM | POA: Diagnosis not present

## 2019-02-05 DIAGNOSIS — C50911 Malignant neoplasm of unspecified site of right female breast: Secondary | ICD-10-CM

## 2019-02-05 DIAGNOSIS — Z9011 Acquired absence of right breast and nipple: Secondary | ICD-10-CM | POA: Insufficient documentation

## 2019-02-05 DIAGNOSIS — Z853 Personal history of malignant neoplasm of breast: Secondary | ICD-10-CM | POA: Diagnosis not present

## 2019-02-06 DIAGNOSIS — E1165 Type 2 diabetes mellitus with hyperglycemia: Secondary | ICD-10-CM | POA: Diagnosis not present

## 2019-02-06 DIAGNOSIS — E1142 Type 2 diabetes mellitus with diabetic polyneuropathy: Secondary | ICD-10-CM | POA: Diagnosis not present

## 2019-02-06 DIAGNOSIS — I10 Essential (primary) hypertension: Secondary | ICD-10-CM | POA: Diagnosis not present

## 2019-02-06 DIAGNOSIS — F17210 Nicotine dependence, cigarettes, uncomplicated: Secondary | ICD-10-CM | POA: Diagnosis not present

## 2019-02-06 DIAGNOSIS — J449 Chronic obstructive pulmonary disease, unspecified: Secondary | ICD-10-CM | POA: Diagnosis not present

## 2019-02-06 DIAGNOSIS — F17200 Nicotine dependence, unspecified, uncomplicated: Secondary | ICD-10-CM | POA: Diagnosis not present

## 2019-02-11 DIAGNOSIS — E039 Hypothyroidism, unspecified: Secondary | ICD-10-CM | POA: Diagnosis not present

## 2019-02-11 DIAGNOSIS — J449 Chronic obstructive pulmonary disease, unspecified: Secondary | ICD-10-CM | POA: Diagnosis not present

## 2019-02-11 DIAGNOSIS — E1142 Type 2 diabetes mellitus with diabetic polyneuropathy: Secondary | ICD-10-CM | POA: Diagnosis not present

## 2019-02-11 DIAGNOSIS — I10 Essential (primary) hypertension: Secondary | ICD-10-CM | POA: Diagnosis not present

## 2019-02-11 DIAGNOSIS — E785 Hyperlipidemia, unspecified: Secondary | ICD-10-CM | POA: Diagnosis not present

## 2019-02-17 DIAGNOSIS — E1142 Type 2 diabetes mellitus with diabetic polyneuropathy: Secondary | ICD-10-CM | POA: Diagnosis not present

## 2019-02-17 DIAGNOSIS — K625 Hemorrhage of anus and rectum: Secondary | ICD-10-CM | POA: Diagnosis not present

## 2019-02-17 DIAGNOSIS — D649 Anemia, unspecified: Secondary | ICD-10-CM | POA: Diagnosis not present

## 2019-02-19 DIAGNOSIS — M6281 Muscle weakness (generalized): Secondary | ICD-10-CM | POA: Diagnosis not present

## 2019-02-19 DIAGNOSIS — R202 Paresthesia of skin: Secondary | ICD-10-CM | POA: Diagnosis not present

## 2019-02-19 DIAGNOSIS — G5622 Lesion of ulnar nerve, left upper limb: Secondary | ICD-10-CM | POA: Diagnosis not present

## 2019-02-19 DIAGNOSIS — M25522 Pain in left elbow: Secondary | ICD-10-CM | POA: Diagnosis not present

## 2019-02-24 ENCOUNTER — Ambulatory Visit: Payer: Medicare HMO | Admitting: Orthopedic Surgery

## 2019-02-26 ENCOUNTER — Encounter (HOSPITAL_COMMUNITY): Payer: Self-pay | Admitting: Hematology

## 2019-02-26 ENCOUNTER — Inpatient Hospital Stay (HOSPITAL_COMMUNITY): Payer: Medicare HMO | Attending: Hematology | Admitting: Hematology

## 2019-02-26 ENCOUNTER — Other Ambulatory Visit: Payer: Self-pay

## 2019-02-26 ENCOUNTER — Inpatient Hospital Stay (HOSPITAL_COMMUNITY): Payer: Medicare HMO

## 2019-02-26 VITALS — BP 133/71 | HR 88 | Temp 97.3°F | Resp 18 | Wt 136.9 lb

## 2019-02-26 DIAGNOSIS — M25512 Pain in left shoulder: Secondary | ICD-10-CM | POA: Insufficient documentation

## 2019-02-26 DIAGNOSIS — D509 Iron deficiency anemia, unspecified: Secondary | ICD-10-CM | POA: Diagnosis not present

## 2019-02-26 DIAGNOSIS — Z853 Personal history of malignant neoplasm of breast: Secondary | ICD-10-CM | POA: Diagnosis not present

## 2019-02-26 DIAGNOSIS — G8929 Other chronic pain: Secondary | ICD-10-CM | POA: Diagnosis not present

## 2019-02-26 DIAGNOSIS — M25511 Pain in right shoulder: Secondary | ICD-10-CM | POA: Diagnosis not present

## 2019-02-26 DIAGNOSIS — Z809 Family history of malignant neoplasm, unspecified: Secondary | ICD-10-CM | POA: Diagnosis not present

## 2019-02-26 DIAGNOSIS — M810 Age-related osteoporosis without current pathological fracture: Secondary | ICD-10-CM

## 2019-02-26 DIAGNOSIS — F17210 Nicotine dependence, cigarettes, uncomplicated: Secondary | ICD-10-CM | POA: Diagnosis not present

## 2019-02-26 DIAGNOSIS — C50911 Malignant neoplasm of unspecified site of right female breast: Secondary | ICD-10-CM

## 2019-02-26 DIAGNOSIS — Z9011 Acquired absence of right breast and nipple: Secondary | ICD-10-CM | POA: Insufficient documentation

## 2019-02-26 LAB — CBC WITH DIFFERENTIAL/PLATELET
Abs Immature Granulocytes: 0.02 10*3/uL (ref 0.00–0.07)
Basophils Absolute: 0 10*3/uL (ref 0.0–0.1)
Basophils Relative: 1 %
Eosinophils Absolute: 0.2 10*3/uL (ref 0.0–0.5)
Eosinophils Relative: 3 %
HCT: 32.6 % — ABNORMAL LOW (ref 36.0–46.0)
Hemoglobin: 9.1 g/dL — ABNORMAL LOW (ref 12.0–15.0)
Immature Granulocytes: 0 %
Lymphocytes Relative: 20 %
Lymphs Abs: 1.6 10*3/uL (ref 0.7–4.0)
MCH: 20.9 pg — ABNORMAL LOW (ref 26.0–34.0)
MCHC: 27.9 g/dL — ABNORMAL LOW (ref 30.0–36.0)
MCV: 74.9 fL — ABNORMAL LOW (ref 80.0–100.0)
Monocytes Absolute: 0.9 10*3/uL (ref 0.1–1.0)
Monocytes Relative: 11 %
Neutro Abs: 5.3 10*3/uL (ref 1.7–7.7)
Neutrophils Relative %: 65 %
Platelets: 239 10*3/uL (ref 150–400)
RBC: 4.35 MIL/uL (ref 3.87–5.11)
RDW: 18.2 % — ABNORMAL HIGH (ref 11.5–15.5)
WBC: 7.9 10*3/uL (ref 4.0–10.5)
nRBC: 0 % (ref 0.0–0.2)

## 2019-02-26 LAB — COMPREHENSIVE METABOLIC PANEL
ALT: 12 U/L (ref 0–44)
AST: 17 U/L (ref 15–41)
Albumin: 3.7 g/dL (ref 3.5–5.0)
Alkaline Phosphatase: 52 U/L (ref 38–126)
Anion gap: 8 (ref 5–15)
BUN: 10 mg/dL (ref 8–23)
CO2: 26 mmol/L (ref 22–32)
Calcium: 9.2 mg/dL (ref 8.9–10.3)
Chloride: 102 mmol/L (ref 98–111)
Creatinine, Ser: 0.75 mg/dL (ref 0.44–1.00)
GFR calc Af Amer: >60 mL/min (ref >60)
GFR calc non Af Amer: >60 mL/min (ref >60)
Glucose, Bld: 104 mg/dL — ABNORMAL HIGH (ref 70–99)
Potassium: 4.5 mmol/L (ref 3.5–5.1)
Sodium: 136 mmol/L (ref 135–145)
Total Bilirubin: 0.6 mg/dL (ref 0.3–1.2)
Total Protein: 7.5 g/dL (ref 6.5–8.1)

## 2019-02-26 LAB — IRON AND TIBC
Iron: 18 ug/dL — ABNORMAL LOW (ref 28–170)
Saturation Ratios: 4 % — ABNORMAL LOW (ref 10.4–31.8)
TIBC: 473 ug/dL — ABNORMAL HIGH (ref 250–450)
UIBC: 455 ug/dL

## 2019-02-26 LAB — VITAMIN B12: Vitamin B-12: 160 pg/mL — ABNORMAL LOW (ref 180–914)

## 2019-02-26 LAB — FERRITIN: Ferritin: 5 ng/mL — ABNORMAL LOW (ref 11–307)

## 2019-02-26 LAB — FOLATE: Folate: 21.4 ng/mL (ref >5.9)

## 2019-02-26 NOTE — Patient Instructions (Addendum)
[facility] Discharge Instructions  You were seen today by Dr. Katragadda. He went over your recent lab results. He will see you back in 6 months for labs and follow up.   Thank you for choosing [facility] at [facility] to provide your oncology and hematology care.  To afford each patient quality time with our provider, please arrive at least 15 minutes before your scheduled appointment time.   If you have a lab appointment with the Cancer Center please come in thru the  Main Entrance and check in at the main information desk  You need to re-schedule your appointment should you arrive 10 or more minutes late.  We strive to give you quality time with our providers, and arriving late affects you and other patients whose appointments are after yours.  Also, if you no show three or more times for appointments you may be dismissed from the clinic at the providers discretion.     Again, thank you for choosing [facility].  Our hope is that these requests will decrease the amount of time that you wait before being seen by our physicians.       _____________________________________________________________  Should you have questions after your visit to [facility], please contact our office at [phone] between the hours of 8:00 a.m. and 4:30 p.m.  Voicemails left after 4:00 p.m. will not be returned until the following business day.  For prescription refill requests, have your pharmacy contact our office and allow 72 hours.    Cancer Center Support Programs:   > Cancer Support Group  2nd Tuesday of the month 1pm-2pm, Journey Room    

## 2019-02-26 NOTE — Assessment & Plan Note (Addendum)
1.  Stage I (T1BN0) grade 1 IDC of the right breast: - Status post right modified radical mastectomy on 01/22/2009, 1 cm IDC, ER/PR positive, Ki-67 9%, HER-2 negative, no LVI, margins clear, 11 lymph nodes negative. - 5 years of anastrozole completed in 2015. - Clinical examination shows slight mastectomy site within normal limits.  No palpable masses in the left breast.  No palpable adenopathy. -Left breast mammogram on 02/05/2019 was BI-RADS Category 1.  2.  Osteoporosis: - I reviewed her DEXA scan from 06/03/2018 which shows a T score of -2.5.  This has worsened since prior DEXA scan from 2015. -She apparently took Fosamax 1 or 2 doses once a week but could not tolerate it. - She is edentulous.  No jaw pains.  We talked about initiating her on Prolia every 6 months.  We talked about side effects including but not limited to hypocalcemia, rare chance of osteonecrosis of the jaw among others.  She understands and gives [place] permission to proceed with the treatment. -We will reevaluate her in 6 months and see how she is tolerating it.  I will also check her vitamin D level.  3.  Microcytic anemia: - Hemoglobin today is 9.1 with MCV of 74.9.  MCH and MCHC was low and RDW elevated.  White count and platelet count was normal. -He reported 1 episode of bleeding per rectum 2 weeks ago.  She has an appointment to see Dr. Buford Dresser.  Reportedly her colonoscopy was 2 years ago.  She reports a history of hemorrhoids. - Ferritin was 5 and percent saturation was 4.  TIBC was elevated at 473.  Vitamin B12 was also low at 160. -She was told to start taking iron tablet daily.  She will use stool softener also.  She has taken iron 20 years ago on regular basis. -We will give her 1 shot of B12 injection.  She was told to take B12 1 mg tablet daily. -We will see her back in 2 months and repeat her blood counts.  If there is no improvement in her iron, will consider parenteral iron therapy.

## 2019-02-26 NOTE — Progress Notes (Signed)
[name], [name]   CLINIC:  Medical Oncology/Hematology  PCP:  Rosita Fire, MD [place] [name] [address] [phone]   REASON FOR VISIT:  Follow-up for right breast cancer.     CANCER STAGING: Cancer Staging Infiltrating ductal carcinoma of breast ([place]) Staging form: Breast, AJCC 7th Edition - Clinical: Stage IA (T1b, N0, cM0) - Signed by Pieter Partridge, MD on 01/16/2011 - Pathologic: No stage assigned - Unsigned    INTERVAL HISTORY:  [name] 78 y.o. female seen for follow-up of right breast cancer.  Denies any new onset chest pains or other body pains.  She completed anastrozole in 2015 for 5 years.  She reportedly took Fosamax for 1 or 2 doses but could not tolerate it.  Appetite is 75%.  Energy levels are 50%.  She has chronic pain in both shoulders blades.  No fevers, night sweats or weight loss in the last 6 months.  No ER visits or hospitalizations.  She reportedly had one episode of rectal bleeding 2 weeks ago.  She reports history of hemorrhoids.   REVIEW OF SYSTEMS:  Review of Systems  Gastrointestinal: Positive for blood in stool.  All other systems reviewed and are negative.    PAST MEDICAL/SURGICAL HISTORY:  Past Medical History:  Diagnosis Date  . Breast CA ([place]) 01/16/2011  . Breast cancer ([place])    rt dx 2010; mastectomy only  . Diverticulitis   . Embolism - blood clot    left lung  . History of prolapse of bladder    patient has gellhorn pessary  . Hypertension   . Infiltrating ductal carcinoma of breast ([place]) 01/16/2011   Started Arimidex on 02/24/2009. Stage IB (T1b N0 M0) grade 1 infiltrating ductal carcinoma of the right breast, status post right modified radical mastectomy on 01/22/2009 for a 10 mm cancer, ER +100%, PR +98%, KI-67 marker was 9%, HER-2 negative, no LVI identified, and 11 lymph nodes were negative, all margins were clear. She had some associated DCIS, is  now on Arimidex and she will take that for  . Stroke ([place])   . Tendonitis 07/2010   shot of cortisone   Past Surgical History:  Procedure Laterality Date  . ABDOMINAL HYSTERECTOMY  1987  . APPENDECTOMY  1974   blood clot in lung  . BREAST BIOPSY  1968   left  . COLONOSCOPY N/A 01/11/2018   Procedure: COLONOSCOPY;  Surgeon: Danie Binder, MD;  Location: [facility];  Service: Endoscopy;  Laterality: N/A;  10:00  . MASTECTOMY MODIFIED RADICAL  01/22/2009   rt  . NASAL SINUS SURGERY  2002  . POLYPECTOMY  01/11/2018   Procedure: POLYPECTOMY;  Surgeon: Danie Binder, MD;  Location: [facility];  Service: Endoscopy;;  ascending; descending;  . TEAR DUCT PROBING    . tubal ligation  1972  . YAG LASER APPLICATION Right 5/36/4680   Procedure: YAG LASER APPLICATION;  Surgeon: Williams Che, MD;  Location: [facility] ORS;  Service: Ophthalmology;  Laterality: Right;     SOCIAL HISTORY:  Social History   Socioeconomic History  . Marital status: Divorced    Spouse name: Not on file  . Number of children: Not on file  . Years of education: Not on file  . Highest education level: Not on file  Occupational History  . Not on file  Social Needs  . Financial resource strain: Not on file  . Food insecurity  Worry: Not on file    Inability: Not on file  . Transportation needs    Medical: Not on file    Non-medical: Not on file  Tobacco Use  . Smoking status: Current Every Day Smoker    Packs/day: 0.50    Years: 62.00    Pack years: 31.00    Types: Cigarettes  . Smokeless tobacco: Never Used  Substance and Sexual Activity  . Alcohol use: Yes    Comment: occ  . Drug use: No  . Sexual activity: Never    Birth control/protection: Surgical    Comment: hyst  Lifestyle  . Physical activity    Days per week: Not on file    Minutes per session: Not on file  . Stress: Not on file  Relationships  . Social Herbalist on phone: Not on file    Gets together: Not on file     Attends religious service: Not on file    Active member of club or organization: Not on file    Attends meetings of clubs or organizations: Not on file    Relationship status: Not on file  . Intimate partner violence    Fear of current or ex partner: Not on file    Emotionally abused: Not on file    Physically abused: Not on file    Forced sexual activity: Not on file  Other Topics Concern  . Not on file  Social History Narrative  . Not on file    FAMILY HISTORY:  Family History  Problem Relation Age of Onset  . Breast cancer Sister   . Breast cancer Maternal Grandmother   . Colon cancer Daughter   . Hypertension Paternal Grandfather   . Stroke Paternal Grandfather   . Other Paternal Grandmother        hardening of arteries  . Other Father        gun shot wound  . Cancer Sister   . Cancer Daughter   . Stroke Son   . Heart attack Son     CURRENT MEDICATIONS:  Outpatient Encounter Medications as of 02/26/2019  Medication Sig Note  . albuterol (PROVENTIL HFA;VENTOLIN HFA) 108 (90 Base) MCG/ACT inhaler Inhale 1 puff into the lungs every 6 (six) hours as needed for wheezing or shortness of breath.   Marland Kitchen amLODipine (NORVASC) 5 MG tablet Take 5 mg by mouth daily.   Marland Kitchen aspirin 325 MG tablet Take 1 tablet (325 mg total) by mouth daily.   . baclofen (LIORESAL) 10 MG tablet    . Calcium Carbonate-Vitamin D (CALCIUM 500 + D PO) Take by mouth daily.   . fluticasone (FLONASE) 50 MCG/ACT nasal spray Place 2 sprays into both nostrils as needed.  02/23/2016: Received from: External Pharmacy  . gabapentin (NEURONTIN) 600 MG tablet Take 600 mg by mouth 3 (three) times daily.    Marland Kitchen ibuprofen (ADVIL) 800 MG tablet Take 1 tablet (800 mg total) by mouth every 8 (eight) hours as needed.   . lovastatin (MEVACOR) 40 MG tablet Take 40 mg by mouth at bedtime.    . metFORMIN (GLUCOPHAGE) 500 MG tablet Take 500 mg by mouth 2 (two) times daily.  02/23/2016: Received from: External Pharmacy  . metoprolol  tartrate (LOPRESSOR) 25 MG tablet Take 25 mg by mouth daily.    . metroNIDAZOLE (METROGEL) 0.75 % vaginal gel Place 1 Applicatorful vaginally once a week. one applicatorful to vagina at bedtime weekly , as needed for vaginal discharge with pessary   .  naphazoline-glycerin (CLEAR EYES) 0.012-0.2 % SOLN Place 2 drops into both eyes daily as needed for irritation.   Marland Kitchen omeprazole (PRILOSEC) 20 MG capsule Take 1 capsule by mouth daily.   Marland Kitchen tiotropium (SPIRIVA) 18 MCG inhalation capsule Place 18 mcg into inhaler and inhale daily as needed.   Marland Kitchen tiZANidine (ZANAFLEX) 4 MG tablet Take 1 tablet (4 mg total) by mouth every 6 (six) hours as needed for muscle spasms.   . TRADJENTA 5 MG TABS tablet Take 5 mg by mouth daily.  02/23/2016: Received from: External Pharmacy  . TRUE METRIX BLOOD GLUCOSE TEST test strip    . [DISCONTINUED] gabapentin (NEURONTIN) 300 MG capsule Take 300 mg by mouth at bedtime.  02/23/2016: Received from: External Pharmacy   No facility-administered encounter medications on file as of 02/26/2019.     ALLERGIES:  Allergies  Allergen Reactions  . Pollen Extract Other (See Comments)    Runny nose, watery eyes and ears get stopped up  . Red Dye      PHYSICAL EXAM:  ECOG Performance status: 1  Vitals:   02/26/19 1134  BP: 133/71  Pulse: 88  Resp: 18  Temp: (!) 97.3 F (36.3 C)  SpO2: 97%   Filed Weights   02/26/19 1134  Weight: 136 lb 14.4 oz (62.1 kg)    Physical Exam Vitals signs reviewed.  Constitutional:      Appearance: Normal appearance.  Cardiovascular:     Rate and Rhythm: Normal rate and regular rhythm.     Heart sounds: Normal heart sounds.  Pulmonary:     Effort: Pulmonary effort is normal.     Breath sounds: Normal breath sounds.  Abdominal:     General: There is no distension.     Palpations: Abdomen is soft. There is no mass.  Skin:    General: Skin is warm.  Neurological:     General: No focal deficit present.     Mental Status: She is alert  and oriented to person, place, and time.  Psychiatric:        Mood and Affect: Mood normal.        Behavior: Behavior normal.    Right mastectomy site is within normal limits.  Left breast has no palpable masses.  No palpable adenopathy.  LABORATORY DATA:  I have reviewed the labs as listed.  CBC    Component Value Date/Time   WBC 7.9 02/26/2019 1222   RBC 4.35 02/26/2019 1222   HGB 9.1 (L) 02/26/2019 1222   HCT 32.6 (L) 02/26/2019 1222   PLT 239 02/26/2019 1222   MCV 74.9 (L) 02/26/2019 1222   MCH 20.9 (L) 02/26/2019 1222   MCHC 27.9 (L) 02/26/2019 1222   RDW 18.2 (H) 02/26/2019 1222   LYMPHSABS 1.6 02/26/2019 1222   MONOABS 0.9 02/26/2019 1222   EOSABS 0.2 02/26/2019 1222   BASOSABS 0.0 02/26/2019 1222   CMP Latest Ref Rng & Units 02/26/2019 04/29/2017 03/22/2016  Glucose 70 - 99 mg/dL 104(H) 180(H) 135(H)  BUN 8 - 23 mg/dL 10 13 8   Creatinine 0.44 - 1.00 mg/dL 0.75 0.74 0.70  Sodium 135 - 145 mmol/L 136 137 139  Potassium 3.5 - 5.1 mmol/L 4.5 3.9 4.3  Chloride 98 - 111 mmol/L 102 99(L) 100(L)  CO2 22 - 32 mmol/L 26 29 -  Calcium 8.9 - 10.3 mg/dL 9.2 9.6 -  Total Protein 6.5 - 8.1 g/dL 7.5 7.9 -  Total Bilirubin 0.3 - 1.2 mg/dL 0.6 0.7 -  Alkaline Phos 38 -  126 U/L 52 58 -  AST 15 - 41 U/L 17 17 -  ALT 0 - 44 U/L 12 13(L) -       DIAGNOSTIC IMAGING:  I have independently reviewed the scans and discussed with the patient.   I have reviewed Venita Lick LPN's note and agree with the documentation.  I personally performed a face-to-face visit, made revisions and my assessment and plan is as follows.    ASSESSMENT & PLAN:   Infiltrating ductal carcinoma of breast 1.  Stage I (T1BN0) grade 1 IDC of the right breast: - Status post right modified radical mastectomy on 01/22/2009, 1 cm IDC, ER/PR positive, Ki-67 9%, HER-2 negative, no LVI, margins clear, 11 lymph nodes negative. - 5 years of anastrozole completed in 2015. - Clinical examination shows slight  mastectomy site within normal limits.  No palpable masses in the left breast.  No palpable adenopathy. -Left breast mammogram on 02/05/2019 was BI-RADS Category 1.  2.  Osteoporosis: - I reviewed her DEXA scan from 06/03/2018 which shows a T score of -2.5.  This has worsened since prior DEXA scan from 2015. -She apparently took Fosamax 1 or 2 doses once a week but could not tolerate it. - She is edentulous.  No jaw pains.  We talked about initiating her on Prolia every 6 months.  We talked about side effects including but not limited to hypocalcemia, rare chance of osteonecrosis of the jaw among others.  She understands and gives [place] permission to proceed with the treatment. -We will reevaluate her in 6 months and see how she is tolerating it.  I will also check her vitamin D level.  3.  Microcytic anemia: - Hemoglobin today is 9.1 with MCV of 74.9.  MCH and MCHC was low and RDW elevated.  White count and platelet count was normal. -He reported 1 episode of bleeding per rectum 2 weeks ago.  She has an appointment to see Dr. Buford Dresser.  Reportedly her colonoscopy was 2 years ago.  She reports a history of hemorrhoids. - Ferritin was 5 and percent saturation was 4.  TIBC was elevated at 473.  Vitamin B12 was also low at 160. -She was told to start taking iron tablet daily.  She will use stool softener also.  She has taken iron 20 years ago on regular basis. -We will give her 1 shot of B12 injection.  She was told to take B12 1 mg tablet daily. -We will see her back in 2 months and repeat her blood counts.  If there is no improvement in her iron, will consider parenteral iron therapy.   Total time spent is 40 minutes with more than 50% of the time spent face-to-face discussing new diagnosis, management, counseling and coordination of care.  Orders placed this encounter:  Orders Placed This Encounter  Procedures  . CBC with Differential/Platelet  . Comprehensive metabolic panel  . Vitamin D 25 hydroxy   . Iron and TIBC  . Ferritin  . Vitamin B12  . Folate  . CBC with Differential/Platelet  . Comprehensive metabolic panel  . Iron and TIBC  . Ferritin  . Vitamin B12  . Folate      Derek Jack, MD [place] [phone]

## 2019-02-27 LAB — VITAMIN D 25 HYDROXY (VIT D DEFICIENCY, FRACTURES): Vit D, 25-Hydroxy: 28 ng/mL — ABNORMAL LOW (ref 30.0–100.0)

## 2019-03-05 ENCOUNTER — Encounter: Payer: Self-pay | Admitting: Orthopedic Surgery

## 2019-03-05 ENCOUNTER — Ambulatory Visit (INDEPENDENT_AMBULATORY_CARE_PROVIDER_SITE_OTHER): Payer: Medicare HMO | Admitting: Orthopedic Surgery

## 2019-03-05 ENCOUNTER — Other Ambulatory Visit: Payer: Self-pay

## 2019-03-05 VITALS — BP 139/54 | HR 80 | Ht 66.0 in | Wt 135.0 lb

## 2019-03-05 DIAGNOSIS — M25512 Pain in left shoulder: Secondary | ICD-10-CM

## 2019-03-05 DIAGNOSIS — G5622 Lesion of ulnar nerve, left upper limb: Secondary | ICD-10-CM | POA: Diagnosis not present

## 2019-03-05 NOTE — Patient Instructions (Addendum)
Cubital Tunnel Syndrome  Cubital tunnel syndrome is a condition that causes pain and weakness of the forearm and hand. It happens when one of the nerves that runs along the inside of the elbow joint (ulnar nerve) becomes irritated. This condition is usually caused by repeated arm motions that are done during sports or work-related activities. What are the causes? This condition may be caused by:  Increased pressure on the ulnar nerve at the elbow, arm, or forearm. This can result from: ? Irritation caused by repeated elbow bending. ? Poorly healed elbow fractures. ? Tumors in the elbow. These are usually noncancerous (benign). ? Scar tissue that develops in the elbow after an injury. ? Bony growths (spurs) near the ulnar nerve.  Stretching of the nerve due to loose elbow ligaments.  Trauma to the nerve at the elbow. What increases the risk? The following factors may make you more likely to develop this condition:  Doing manual labor that requires frequent bending of the elbow.  Playing sports that include repeated or strenuous throwing motions, such as baseball.  Playing contact sports, such as football or lacrosse.  Not warming up properly before activities.  Having diabetes.  Having an underactive thyroid (hypothyroidism). What are the signs or symptoms? Symptoms of this condition include:  Clumsiness or weakness of the hand.  Tenderness of the inner elbow.  Aching or soreness of the inner elbow, forearm, or fingers, especially the little finger or the ring finger.  Increased pain when forcing the elbow to bend.  Reduced control when throwing objects.  Tingling, numbness, or a burning feeling inside the forearm or in part of the hand or fingers, especially the little finger or the ring finger.  Sharp pains that shoot from the elbow down to the wrist and hand.  The inability to grip or pinch hard. How is this diagnosed? This condition is diagnosed based on:  Your  symptoms and medical history. Your health care provider will also ask for details about any injury.  A physical exam. You may also have tests, including:  Electromyogram (EMG). This test measures electrical signals sent by your nerves into the muscles.  Nerve conduction study. This test measures how well electrical signals pass through your nerves.  Imaging tests, such as X-rays, ultrasound, and MRI. These tests check for possible causes of your condition. How is this treated? This condition may be treated by:  Stopping the activities that are causing your symptoms to get worse.  Icing and taking medicines to reduce pain and swelling.  Wearing a splint to prevent your elbow from bending, or wearing an elbow pad where the ulnar nerve is closest to the skin.  Working with a physical therapist in less severe cases. This may help to: ? Decrease your symptoms. ? Improve the strength and range of motion of your elbow, forearm, and hand. If these treatments do not help, surgery may be needed. Follow these instructions at home: If you have a splint:  Wear the splint as told by your health care provider. Remove it only as told by your health care provider.  Loosen the splint if your fingers tingle, become numb, or turn cold and blue.  Keep the splint clean.  If the splint is not waterproof: ? Do not let it get wet. ? Cover it with a watertight covering when you take a bath or shower. Managing pain, stiffness, and swelling   If directed, put ice on the injured area: ? Put ice in a plastic bag. ?  Place a towel between your skin and the bag. ? Leave the ice on for 20 minutes, 2-3 times a day.  Move your fingers often to avoid stiffness and to lessen swelling.  Raise (elevate) the injured area above the level of your heart while you are sitting or lying down. General instructions  Take over-the-counter and prescription medicines only as told by your health care provider.  Do any  exercise or physical therapy as told by your health care provider.  Do not drive or use heavy machinery while taking prescription pain medicine.  If you were given an elbow pad, wear it as told by your health care provider.  Keep all follow-up visits as told by your health care provider. This is important. Contact a health care provider if:  Your symptoms get worse.  Your symptoms do not get better with treatment.  You have new pain.  Your hand on the injured side feels numb or cold. Summary  Cubital tunnel syndrome is a condition that causes pain and weakness of the forearm and hand.  You are more likely to develop this condition if you do work or play sports that involve repeated arm movements.  This condition is often treated by stopping repetitive activities, applying ice, and using anti-inflammatory medicines.  In rare cases, surgery may be needed. This information is not intended to replace advice given to you by your health care provider. Make sure you discuss any questions you have with your health care provider. Document Released: 07/17/2005 Document Revised: 12/03/2017 Document Reviewed: 12/03/2017 Elsevier Patient Education  [place].  Trigger Point Injection Trigger points are areas where you have pain. A trigger point injection is a shot given in the trigger point to help relieve pain for a few days to a few months. Common places for trigger points include:  The neck.  The shoulders.  The upper back.  The lower back. A trigger point injection will not cure long-term (chronic) pain permanently. These injections do not always work for every person. For some people, they can help to relieve pain for a few days to a few months. Tell a health care provider about:  Any allergies you have.  All medicines you are taking, including vitamins, herbs, eye drops, creams, and over-the-counter medicines.  Any problems you or family members have had with  anesthetic medicines.  Any blood disorders you have.  Any surgeries you have had.  Any medical conditions you have. What are the risks? Generally, this is a safe procedure. However, problems may occur, including:  Infection.  Bleeding or bruising.  Allergic reaction to the injected medicine.  Irritation of the skin around the injection site. What happens before the procedure? Ask your health care provider about:  Changing or stopping your regular medicines. This is especially important if you are taking diabetes medicines or blood thinners.  Taking medicines such as aspirin and ibuprofen. These medicines can thin your blood. Do not take these medicines unless your health care provider tells you to take them.  Taking over-the-counter medicines, vitamins, herbs, and supplements. What happens during the procedure?   Your health care provider will feel for trigger points. A marker may be used to circle the area for the injection.  The skin over the trigger point will be washed with a germ-killing (antiseptic) solution.  A thin needle is used for the injection. You may feel pain or a twitching feeling when the needle enters the trigger point.  A numbing solution may be  injected into the trigger point. Sometimes a medicine to keep down inflammation is also injected.  Your health care provider may move the needle around the area where the trigger point is located until the tightness and twitching goes away.  After the injection, your health care provider may put gentle pressure over the injection site.  The injection site will be covered with a bandage (dressing). The procedure may vary among health care providers and hospitals. What can I expect after treatment? After treatment, you may have:  Soreness and stiffness for 1-2 days.  A dressing. This can be taken off in a few hours or as told by your health care provider. Follow these instructions at home: Injection site care   Remove your dressing as told by your health care provider.  Check your injection site every day for signs of infection. Check for: ? Redness, swelling, or pain. ? Fluid or blood. ? Warmth. ? Pus or a bad smell. Managing pain, stiffness, and swelling  If directed, put ice on the affected area. ? Put ice in a plastic bag. ? Place a towel between your skin and the bag. ? Leave the ice on for 20 minutes, 2-3 times a day. General instructions  If you were asked to stop your regular medicines, ask your health care provider when you may start taking them again.  Return to your normal activities as told by your health care provider. Ask your health care provider what activities are safe for you.  Do not take baths, swim, or use a hot tub until your health care provider approves.  You may be asked to see an occupational or physical therapist for exercises that reduce muscle strain and stretch the area of the trigger point.  Keep all follow-up visits as told by your health care provider. This is important. Contact a health care provider if:  Your pain comes back, and it is worse than before the injection. You may need more injections.  You have chills or a fever.  The injection site becomes more painful, red, swollen, or warm to the touch. Summary  A trigger point injection is a shot given in the trigger point to help relieve pain for a few days to a few months.  Common places for trigger point injections are the neck, shoulder, upper back, and lower back.  These injections do not always work for every person, but for some people, the injections can help to relieve pain for a few days to a few months.  Contact a health care provider if symptoms come back or they are worse than before treatment. Also, get help if the injection site becomes more painful, red, swollen, or warm to the touch. This information is not intended to replace advice given to you by your health care provider.  Make sure you discuss any questions you have with your health care provider. Document Released: 07/06/2011 Document Revised: 08/28/2018 Document Reviewed: 08/28/2018 Elsevier Patient Education  2020 [employer].

## 2019-03-05 NOTE — Progress Notes (Signed)
Progress Note   Patient ID: [name], female   DOB: [birth year], 78 y.o.   MRN: [id number]   Chief Complaint  Patient presents with  . Shoulder Pain    left / better   . Arm Pain    pain down left hand tingling/ has a knot on arm she wants you to see     Encounter Diagnoses  Name Primary?  . Cubital tunnel syndrome on left Yes  . Trigger point of left shoulder region     2 issues for [name] today  1.  She is got a cubital tunnel syndrome with pain at the elbow numbness and tingling in the forearm and hand  Treated with splinting gabapentin and ibuprofen  2.  Persistent trigger point left shoulder.  She had physical therapy 1 injection she says that the injections wearing off  Previous history: 76 she comes in with a new problem she has pain in the left elbow for 4 weeks no treatment wakes her up at night,  numbness starts at the elbow radiates to the small finger and ring finger of the left hand denies any trauma.  She reports decreased grip strength and weakness in her left hand   Encounter Diagnosis  Name Primary?  . Cubital tunnel syndrome on left Yes     PLAN:  20-30 degrees splint for nighttime Continue gabapentin and ibuprofen Follow-up 6 weeks, no improvement nerve study   ROS  Joint pain  BP (!) 139/54   Pulse 80   Ht 5\' 6"  (1.676 m)   Wt 135 lb (61.2 kg)   BMI 21.79 kg/m   Physical Exam Vitals signs and nursing note reviewed.  Constitutional:      Appearance: Normal appearance.  Musculoskeletal:     Comments: Left elbow tenderness painful extension weakness in grip  Left shoulder tenderness over the medial border the scapula  Neurological:     Mental Status: She is alert and oriented to person, place, and time.  Psychiatric:        Mood and Affect: Mood normal.      Medical decisions:  (Established problem worse, x-ray ,physical therapy, over-the-counter medicines, read outside film or summarize x-ray)  Data  Imaging:   No new imaging    Encounter Diagnoses  Name Primary?  . Cubital tunnel syndrome on left Yes  . Trigger point of left shoulder region     PLAN:   NCS for dx of cubital tunnel syndrome   Inject left shoulder trigger point  TRIGGER POINT INJECTION  Patient consented verbally for injection of the  posterior/MEDIAL scapula. Timeout confirmed the site of injection A steroid injection was performed at inferior border of the left scapula at the point of maximal tenderness using 1% plain Lidocaine and 40 mg of Depo-Medrol. This was well tolerated.  Arther Abbott, MD 03/05/2019 11:50 AM

## 2019-03-07 ENCOUNTER — Other Ambulatory Visit (HOSPITAL_COMMUNITY): Payer: Self-pay | Admitting: *Deleted

## 2019-03-07 DIAGNOSIS — M810 Age-related osteoporosis without current pathological fracture: Secondary | ICD-10-CM

## 2019-03-07 DIAGNOSIS — C50911 Malignant neoplasm of unspecified site of right female breast: Secondary | ICD-10-CM

## 2019-03-10 ENCOUNTER — Inpatient Hospital Stay (HOSPITAL_COMMUNITY): Payer: Medicare HMO | Attending: Hematology

## 2019-03-10 ENCOUNTER — Inpatient Hospital Stay (HOSPITAL_COMMUNITY): Payer: Medicare HMO

## 2019-03-10 ENCOUNTER — Other Ambulatory Visit: Payer: Self-pay

## 2019-03-10 ENCOUNTER — Encounter: Payer: Self-pay | Admitting: Gastroenterology

## 2019-03-10 VITALS — BP 128/66 | HR 98 | Temp 97.9°F | Resp 18

## 2019-03-10 DIAGNOSIS — E538 Deficiency of other specified B group vitamins: Secondary | ICD-10-CM | POA: Insufficient documentation

## 2019-03-10 DIAGNOSIS — Z170 Estrogen receptor positive status [ER+]: Secondary | ICD-10-CM | POA: Insufficient documentation

## 2019-03-10 DIAGNOSIS — D509 Iron deficiency anemia, unspecified: Secondary | ICD-10-CM | POA: Diagnosis not present

## 2019-03-10 DIAGNOSIS — Z853 Personal history of malignant neoplasm of breast: Secondary | ICD-10-CM | POA: Diagnosis not present

## 2019-03-10 DIAGNOSIS — Z9011 Acquired absence of right breast and nipple: Secondary | ICD-10-CM | POA: Insufficient documentation

## 2019-03-10 DIAGNOSIS — Z79899 Other long term (current) drug therapy: Secondary | ICD-10-CM | POA: Insufficient documentation

## 2019-03-10 DIAGNOSIS — M810 Age-related osteoporosis without current pathological fracture: Secondary | ICD-10-CM

## 2019-03-10 DIAGNOSIS — C50911 Malignant neoplasm of unspecified site of right female breast: Secondary | ICD-10-CM

## 2019-03-10 LAB — COMPREHENSIVE METABOLIC PANEL
ALT: 9 U/L (ref 0–44)
AST: 16 U/L (ref 15–41)
Albumin: 3.9 g/dL (ref 3.5–5.0)
Alkaline Phosphatase: 54 U/L (ref 38–126)
Anion gap: 10 (ref 5–15)
BUN: 14 mg/dL (ref 8–23)
CO2: 25 mmol/L (ref 22–32)
Calcium: 9.4 mg/dL (ref 8.9–10.3)
Chloride: 101 mmol/L (ref 98–111)
Creatinine, Ser: 0.78 mg/dL (ref 0.44–1.00)
GFR calc Af Amer: >60 mL/min (ref >60)
GFR calc non Af Amer: >60 mL/min (ref >60)
Glucose, Bld: 181 mg/dL — ABNORMAL HIGH (ref 70–99)
Potassium: 4.6 mmol/L (ref 3.5–5.1)
Sodium: 136 mmol/L (ref 135–145)
Total Bilirubin: 0.5 mg/dL (ref 0.3–1.2)
Total Protein: 7.8 g/dL (ref 6.5–8.1)

## 2019-03-10 MED ORDER — CYANOCOBALAMIN 1000 MCG/ML IJ SOLN
1000.0000 ug | Freq: Once | INTRAMUSCULAR | Status: AC
  Administered 2019-03-10: 1000 ug via INTRAMUSCULAR

## 2019-03-10 MED ORDER — DENOSUMAB 60 MG/ML ~~LOC~~ SOSY
60.0000 mg | PREFILLED_SYRINGE | Freq: Once | SUBCUTANEOUS | Status: AC
  Administered 2019-03-10: 60 mg via SUBCUTANEOUS

## 2019-03-10 MED ORDER — CYANOCOBALAMIN 1000 MCG/ML IJ SOLN
INTRAMUSCULAR | Status: AC
  Filled 2019-03-10: qty 1

## 2019-03-10 MED ORDER — DENOSUMAB 60 MG/ML ~~LOC~~ SOSY
PREFILLED_SYRINGE | SUBCUTANEOUS | Status: AC
  Filled 2019-03-10: qty 1

## 2019-03-10 NOTE — Patient Instructions (Signed)
[place] at [facility]  Discharge Instructions:  B12 and Prolia injections received today.   Cyanocobalamin, Vitamin B12 injection What is this medicine? CYANOCOBALAMIN (sye an oh koe BAL a min) is a man made form of vitamin B12. Vitamin B12 is used in the growth of healthy blood cells, nerve cells, and proteins in the body. It also helps with the metabolism of fats and carbohydrates. This medicine is used to treat people who can not absorb vitamin B12. This medicine may be used for other purposes; ask your health care provider or pharmacist if you have questions. COMMON BRAND NAME(S): B-12 Compliance Kit, B-12 Injection Kit, Cyomin, LA-12, Nutri-Twelve, Physicians EZ Use B-12, Primabalt What should I tell my health care provider before I take this medicine? They need to know if you have any of these conditions:  kidney disease  Leber's disease  megaloblastic anemia  an unusual or allergic reaction to cyanocobalamin, cobalt, other medicines, foods, dyes, or preservatives  pregnant or trying to get pregnant  breast-feeding How should I use this medicine? This medicine is injected into a muscle or deeply under the skin. It is usually given by a health care professional in a clinic or doctor's office. However, your doctor may teach you how to inject yourself. Follow all instructions. Talk to your pediatrician regarding the use of this medicine in children. Special care may be needed. Overdosage: If you think you have taken too much of this medicine contact a poison control center or emergency room at once. NOTE: This medicine is only for you. Do not share this medicine with others. What if I miss a dose? If you are given your dose at a clinic or doctor's office, call to reschedule your appointment. If you give your own injections and you miss a dose, take it as soon as you can. If it is almost time for your next dose, take only that dose. Do not take double or  extra doses. What may interact with this medicine?  colchicine  heavy alcohol intake This list may not describe all possible interactions. Give your health care provider a list of all the medicines, herbs, non-prescription drugs, or dietary supplements you use. Also tell them if you smoke, drink alcohol, or use illegal drugs. Some items may interact with your medicine. What should I watch for while using this medicine? Visit your doctor or health care professional regularly. You may need blood work done while you are taking this medicine. You may need to follow a special diet. Talk to your doctor. Limit your alcohol intake and avoid smoking to get the best benefit. What side effects may I notice from receiving this medicine? Side effects that you should report to your doctor or health care professional as soon as possible:  allergic reactions like skin rash, itching or hives, swelling of the face, lips, or tongue  blue tint to skin  chest tightness, pain  difficulty breathing, wheezing  dizziness  red, swollen painful area on the leg Side effects that usually do not require medical attention (report to your doctor or health care professional if they continue or are bothersome):  diarrhea  headache This list may not describe all possible side effects. Call your doctor for medical advice about side effects. You may report side effects to FDA at 1-800-FDA-1088. Where should I keep my medicine? Keep out of the reach of children. Store at room temperature between 15 and 30 degrees C (59 and 85 degrees F). Protect from  light. Throw away any unused medicine after the expiration date. NOTE: This sheet is a summary. It may not cover all possible information. If you have questions about this medicine, talk to your doctor, pharmacist, or health care provider.  2020 Elsevier/Gold Standard (2007-10-28 22:10:20)    Denosumab injection What is this medicine? DENOSUMAB (den oh sue mab) slows  bone breakdown. Prolia is used to treat osteoporosis in women after menopause and in men, and in people who are taking corticosteroids for 6 months or more. Delton See is used to treat a high calcium level due to cancer and to prevent bone fractures and other bone problems caused by multiple myeloma or cancer bone metastases. Delton See is also used to treat giant cell tumor of the bone. This medicine may be used for other purposes; ask your health care provider or pharmacist if you have questions. COMMON BRAND NAME(S): Prolia, XGEVA What should I tell my health care provider before I take this medicine? They need to know if you have any of these conditions:  dental disease  having surgery or tooth extraction  infection  kidney disease  low levels of calcium or Vitamin D in the blood  malnutrition  on hemodialysis  skin conditions or sensitivity  thyroid or parathyroid disease  an unusual reaction to denosumab, other medicines, foods, dyes, or preservatives  pregnant or trying to get pregnant  breast-feeding How should I use this medicine? This medicine is for injection under the skin. It is given by a health care professional in a hospital or clinic setting. A special MedGuide will be given to you before each treatment. Be sure to read this information carefully each time. For Prolia, talk to your pediatrician regarding the use of this medicine in children. Special care may be needed. For Delton See, talk to your pediatrician regarding the use of this medicine in children. While this drug may be prescribed for children as young as 13 years for selected conditions, precautions do apply. Overdosage: If you think you have taken too much of this medicine contact a poison control center or emergency room at once. NOTE: This medicine is only for you. Do not share this medicine with others. What if I miss a dose? It is important not to miss your dose. Call your doctor or health care professional if  you are unable to keep an appointment. What may interact with this medicine? Do not take this medicine with any of the following medications:  other medicines containing denosumab This medicine may also interact with the following medications:  medicines that lower your chance of fighting infection  steroid medicines like prednisone or cortisone This list may not describe all possible interactions. Give your health care provider a list of all the medicines, herbs, non-prescription drugs, or dietary supplements you use. Also tell them if you smoke, drink alcohol, or use illegal drugs. Some items may interact with your medicine. What should I watch for while using this medicine? Visit your doctor or health care professional for regular checks on your progress. Your doctor or health care professional may order blood tests and other tests to see how you are doing. Call your doctor or health care professional for advice if you get a fever, chills or sore throat, or other symptoms of a cold or flu. Do not treat yourself. This drug may decrease your body's ability to fight infection. Try to avoid being around people who are sick. You should make sure you get enough calcium and vitamin D while you  are taking this medicine, unless your doctor tells you not to. Discuss the foods you eat and the vitamins you take with your health care professional. See your dentist regularly. Brush and floss your teeth as directed. Before you have any dental work done, tell your dentist you are receiving this medicine. Do not become pregnant while taking this medicine or for 5 months after stopping it. Talk with your doctor or health care professional about your birth control options while taking this medicine. Women should inform their doctor if they wish to become pregnant or think they might be pregnant. There is a potential for serious side effects to an unborn child. Talk to your health care professional or pharmacist for  more information. What side effects may I notice from receiving this medicine? Side effects that you should report to your doctor or health care professional as soon as possible:  allergic reactions like skin rash, itching or hives, swelling of the face, lips, or tongue  bone pain  breathing problems  dizziness  jaw pain, especially after dental work  redness, blistering, peeling of the skin  signs and symptoms of infection like fever or chills; cough; sore throat; pain or trouble passing urine  signs of low calcium like fast heartbeat, muscle cramps or muscle pain; pain, tingling, numbness in the hands or feet; seizures  unusual bleeding or bruising  unusually weak or tired Side effects that usually do not require medical attention (report to your doctor or health care professional if they continue or are bothersome):  constipation  diarrhea  headache  joint pain  loss of appetite  muscle pain  runny nose  tiredness  upset stomach This list may not describe all possible side effects. Call your doctor for medical advice about side effects. You may report side effects to FDA at 1-800-FDA-1088. Where should I keep my medicine? This medicine is only given in a clinic, doctor's office, or other health care setting and will not be stored at home. NOTE: This sheet is a summary. It may not cover all possible information. If you have questions about this medicine, talk to your doctor, pharmacist, or health care provider.  2020 Elsevier/Gold Standard (2017-11-23 16:10:44)  _______________________________________________________________  Thank you for choosing [employer] at [facility] to provide your oncology and hematology care.  To afford each patient quality time with our providers, please arrive at least 15 minutes before your scheduled appointment.  You need to re-schedule your appointment if you arrive 10 or more minutes late.  We strive to give  you quality time with our providers, and arriving late affects you and other patients whose appointments are after yours.  Also, if you no show three or more times for appointments you may be dismissed from the clinic.  Again, thank you for choosing [place] at [place] hope is that these requests will allow you access to exceptional care and in a timely manner. _______________________________________________________________  If you have questions after your visit, please contact our office at [phone] between the hours of 8:30 a.m. and 5:00 p.m. Voicemails left after 4:30 p.m. will not be returned until the following business day. _______________________________________________________________  For prescription refill requests, have your pharmacy contact our office. _______________________________________________________________  Recommendations made by the consultant and any test results will be sent to your referring physician. _______________________________________________________________

## 2019-03-10 NOTE — Progress Notes (Signed)
[name] presents today for Prolia and B12 injection. VSS. Lab work reviewed prior to administration. Patient reports no recent or planned invasive dental work. She reports she has not been taking Ca and Vit D but she will start. She denies jaw or tooth pain. Education provided, verbally and written. Pt reported on signs/symptoms to report. Verbalized understanding. Injections tolerated without incident or complaint. See MAR for details. Patient discharged in satisfactory condition with follow up instructions.

## 2019-03-20 DIAGNOSIS — J449 Chronic obstructive pulmonary disease, unspecified: Secondary | ICD-10-CM | POA: Diagnosis not present

## 2019-03-20 DIAGNOSIS — I10 Essential (primary) hypertension: Secondary | ICD-10-CM | POA: Diagnosis not present

## 2019-03-27 ENCOUNTER — Ambulatory Visit (INDEPENDENT_AMBULATORY_CARE_PROVIDER_SITE_OTHER): Payer: Medicare HMO | Admitting: Nurse Practitioner

## 2019-03-27 ENCOUNTER — Encounter: Payer: Self-pay | Admitting: Nurse Practitioner

## 2019-03-27 ENCOUNTER — Encounter: Payer: Self-pay | Admitting: Gastroenterology

## 2019-03-27 ENCOUNTER — Other Ambulatory Visit: Payer: Self-pay

## 2019-03-27 DIAGNOSIS — K649 Unspecified hemorrhoids: Secondary | ICD-10-CM | POA: Diagnosis not present

## 2019-03-27 DIAGNOSIS — K625 Hemorrhage of anus and rectum: Secondary | ICD-10-CM

## 2019-03-27 DIAGNOSIS — D509 Iron deficiency anemia, unspecified: Secondary | ICD-10-CM | POA: Diagnosis not present

## 2019-03-27 NOTE — Assessment & Plan Note (Signed)
The patient does have iron deficiency anemia.  She is followed by hematology/oncology for breast cancer.  They noted low iron saturation, low ferritin.  I placed her on oral iron.  They indicated they would follow her labs and consider parenteral iron if needed.  At this point, given that she has had a colonoscopy when the last year, the only endoscopic evaluation to consider would be an EGD and, if that is normal, possibly a Givens capsule.  However, the patient is declining EGD at this time.  I told her that if she changes her mind we can always work on scheduling in the future.  Recommend she call us for any significant bleeding, black stools, worsening GERD symptoms.  Continue iron and follow-up with hematology/oncology.  Follow-up here in 6 months.

## 2019-03-27 NOTE — Patient Instructions (Signed)
Your health issues we discussed today were:   Hemorrhoids: 1. Continue your coconut oil treatment of your hemorrhoids 2. Let us know if you have worsening hemorrhoid symptoms including more frequent rectal bleeding, rectal pain/irritation  Anemia: 1. Continue to follow-up with hematology/oncology based on her recommendations 2. Continue taking your iron supplements 3. If needed, hematology may start you on IV iron to help boost your iron stores 4. Call us if you see any worsening bleeding, pitch black tarry stools, or worsening heartburn symptoms  Rectal bleeding: 1. As we discussed, your rectal bleeding is likely due to your hemorrhoids 2. Continue hemorrhoid treatments as per the instructions above 3. Call us if he has any worsening or severe symptoms  Overall I recommend:  1. Continue her other current medications 2. Follow-up in 6 months 3. Call us if you have any questions or concerns.   Because of recent events of COVID-19 ("Coronavirus"), follow CDC recommendations:  1. Wash your hand frequently 2. Avoid touching your face 3. Stay away from people who are sick 4. If you have symptoms such as fever, cough, shortness of breath then call your healthcare provider for further guidance 5. If you are sick, STAY AT HOME unless otherwise directed by your healthcare provider. 6. Follow directions from state and national officials regarding staying safe   At [facility] we value your feedback. You may receive a survey about your visit today. Please share your experience as we strive to create trusting relationships with our patients to provide genuine, compassionate, quality care.  We appreciate your understanding and patience as we review any laboratory studies, imaging, and other diagnostic tests that are ordered as we care for you. Our office policy is 5 business days for review of these results, and any emergent or urgent results are addressed in a timely manner for  your best interest. If you do not hear from our office in 1 week, please contact us.   We also encourage the use of MyChart, which contains your medical information for your review as well. If you are not enrolled in this feature, an access code is on this after visit summary for your convenience. Thank you for allowing [place] to be involved in your care.  It was great to see you today!  I hope you have a great summer!!

## 2019-03-27 NOTE — Progress Notes (Signed)
Referring Provider: Rosita Fire, MD Primary Care Physician:  Rosita Fire, MD Primary GI:  Dr. Oneida Alar  Chief Complaint  Patient presents with  . Rectal Bleeding    states she has hemorrhoids  . Anemia    HPI:   [name] is a 78 y.o. female who presents on referral from primary care for rectal bleeding and anemia.  Information provided with referral including labs: 02/12/2019 including CBC with hemoglobin mildly low at 9.3, platelet count 203, microcytic and hypochromic.  At that time indicated rectal bleeding which is recurrent and recommended to avoid NSAIDs and referred to GI.  The patient was last seen in our office 2010.  However, the patient did have a colonoscopy 01/11/2018 through nurse triage which found two 7 mm polyps in the ascending colon, a single 8 mm polyp in the ascending colon, diverticulosis, internal hemorrhoids, significant looping of the colon.  Surgical pathology, polyps to be tubular adenoma.  Recommended Preparation H 4 times a day as needed for rectal pain/pressure/bleeding, high-fiber diet.  Recommend no future colonoscopy unless new symptoms develop.  The patient currently sees oncology for infiltrating ductal carcinoma the right breast.  Last saw oncology 02/26/2019 at that point noted microcytic anemia with a hemoglobin of 9.1.  Reported one episode of bleeding per rectum 2 weeks prior and has upcoming GI appointment.  Reported her colonoscopy was last completed 2 years ago (in actuality it was completed about 1 year ago).  Ferritin was 5.4% saturation.  Vitamin B12 also low at 160.  She was started on iron daily and stool softener.  She was given a single injection of B12 and recommended to take B12 oral supplement once daily.  Follow-up in 2 months to recheck blood counts.  Consider parenteral iron therapy if no improvement.  Today she states she's doing ok overall. She has known hemorrhoids and uses coconut oil which she feels works for her hemorrhoids.  Has hematochezia "not often at all." Sees Heme/Onc and was started on iron due to low iron/ferritin. Also on Vitamin B12. Denies black or tarry stools. Denies GERD symptoms other than rare symptoms which resolved with Rolaids; is on Prilosec. Had a "light stroke" a few years ago and is on daily ASA. No other NSAIDs. Denies abdominal pain, N/V, fever, chills, unintentional weight loss. Denies URI or flu-like symptoms. Denies loss of sense of taste or smell. Denies chest pain, dyspnea, dizziness, lightheadedness, syncope, near syncope. Denies any other upper or lower GI symptoms.  Has never had an EGD. Declines an EGD at this point.  Past Medical History:  Diagnosis Date  . Breast CA ([place]) 01/16/2011  . Breast cancer ([place])    rt dx 2010; mastectomy only  . Diverticulitis   . Embolism - blood clot    left lung  . History of prolapse of bladder    patient has gellhorn pessary  . Hypertension   . Infiltrating ductal carcinoma of breast ([place]) 01/16/2011   Started Arimidex on 02/24/2009. Stage IB (T1b N0 M0) grade 1 infiltrating ductal carcinoma of the right breast, status post right modified radical mastectomy on 01/22/2009 for a 10 mm cancer, ER +100%, PR +98%, KI-67 marker was 9%, HER-2 negative, no LVI identified, and 11 lymph nodes were negative, all margins were clear. She had some associated DCIS, is now on Arimidex and she will take that for  . Stroke ([place])   . Tendonitis 07/2010   shot of cortisone    Past Surgical History:  Procedure Laterality Date  . ABDOMINAL HYSTERECTOMY  1987  . APPENDECTOMY  1974   blood clot in lung  . BREAST BIOPSY  1968   left  . COLONOSCOPY N/A 01/11/2018   Procedure: COLONOSCOPY;  Surgeon: Danie Binder, MD;  Location: [facility] SUITE;  Service: Endoscopy;  Laterality: N/A;  10:00  . MASTECTOMY MODIFIED RADICAL  01/22/2009   rt  . NASAL SINUS SURGERY  2002  . POLYPECTOMY  01/11/2018   Procedure: POLYPECTOMY;  Surgeon: Danie Binder, MD;  Location: [facility]  SUITE;  Service: Endoscopy;;  ascending; descending;  . TEAR DUCT PROBING    . tubal ligation  1972  . YAG LASER APPLICATION Right 04/23/2682   Procedure: YAG LASER APPLICATION;  Surgeon: Williams Che, MD;  Location: [facility] ORS;  Service: Ophthalmology;  Laterality: Right;    Current Outpatient Medications  Medication Sig Dispense Refill  . acetaminophen (TYLENOL) 650 MG CR tablet Take 650 mg by mouth as needed for pain.    Marland Kitchen albuterol (PROVENTIL HFA;VENTOLIN HFA) 108 (90 Base) MCG/ACT inhaler Inhale 1 puff into the lungs every 6 (six) hours as needed for wheezing or shortness of breath.    Marland Kitchen amLODipine (NORVASC) 5 MG tablet Take 5 mg by mouth daily.    Marland Kitchen aspirin 325 MG tablet Take 1 tablet (325 mg total) by mouth daily. 30 tablet 3  . Cyanocobalamin (VITAMIN B-12 IJ) Inject as directed.    . Ferrous Sulfate (IRON) 325 (65 Fe) MG TABS Take by mouth daily.     . fluticasone (FLONASE) 50 MCG/ACT nasal spray Place 2 sprays into both nostrils as needed.     . gabapentin (NEURONTIN) 300 MG capsule Take 300 mg by mouth 3 (three) times daily.    Marland Kitchen lovastatin (MEVACOR) 40 MG tablet Take 40 mg by mouth at bedtime.     . metFORMIN (GLUCOPHAGE) 500 MG tablet Take 500 mg by mouth 2 (two) times daily.     . metoprolol tartrate (LOPRESSOR) 25 MG tablet Take 25 mg by mouth daily.     . metroNIDAZOLE (METROGEL) 0.75 % vaginal gel Place 1 Applicatorful vaginally once a week. one applicatorful to vagina at bedtime weekly , as needed for vaginal discharge with pessary 70 g 4  . naphazoline-glycerin (CLEAR EYES) 0.012-0.2 % SOLN Place 2 drops into both eyes daily as needed for irritation.    Marland Kitchen omeprazole (PRILOSEC) 20 MG capsule Take 1 capsule by mouth daily.    Marland Kitchen tiotropium (SPIRIVA) 18 MCG inhalation capsule Place 18 mcg into inhaler and inhale daily as needed.    . TRADJENTA 5 MG TABS tablet Take 5 mg by mouth daily.     . TRUE METRIX BLOOD GLUCOSE TEST test strip      No current facility-administered  medications for this visit.     Allergies as of 03/27/2019 - Review Complete 03/27/2019  Allergen Reaction Noted  . Pollen extract Other (See Comments) 02/10/2014  . Red dye  01/22/2019    Family History  Problem Relation Age of Onset  . Breast cancer Sister   . Breast cancer Maternal Grandmother   . Colon cancer Daughter 68  . Hypertension Paternal Grandfather   . Stroke Paternal Grandfather   . Other Paternal Grandmother        hardening of arteries  . Other Father        gun shot wound  . Cancer Sister   . Cancer Daughter   . Stroke Son   .  Heart attack Son   . Gastric cancer Neg Hx   . Esophageal cancer Neg Hx     Social History   Socioeconomic History  . Marital status: Divorced    Spouse name: Not on file  . Number of children: Not on file  . Years of education: Not on file  . Highest education level: Not on file  Occupational History  . Not on file  Social Needs  . Financial resource strain: Not on file  . Food insecurity    Worry: Not on file    Inability: Not on file  . Transportation needs    Medical: Not on file    Non-medical: Not on file  Tobacco Use  . Smoking status: Current Every Day Smoker    Packs/day: 0.50    Years: 62.00    Pack years: 31.00    Types: Cigarettes  . Smokeless tobacco: Never Used  Substance and Sexual Activity  . Alcohol use: Yes    Comment: occ  . Drug use: No  . Sexual activity: Never    Birth control/protection: Surgical    Comment: hyst  Lifestyle  . Physical activity    Days per week: Not on file    Minutes per session: Not on file  . Stress: Not on file  Relationships  . Social Herbalist on phone: Not on file    Gets together: Not on file    Attends religious service: Not on file    Active member of club or organization: Not on file    Attends meetings of clubs or organizations: Not on file    Relationship status: Not on file  Other Topics Concern  . Not on file  Social History Narrative  .  Not on file    Review of Systems: Complete ROS negative except as per HPI.   Physical Exam: BP 116/63   Pulse 78   Temp (!) 96.9 F (36.1 C) (Temporal)   Ht _0  (1.676 m)   Wt 136 lb 6.4 oz (61.9 kg)   BMI 22.02 kg/m  General:   Alert and oriented. Pleasant and cooperative. Well-nourished and well-developed.  Eyes:  Without icterus, sclera clear and conjunctiva pink.  Ears:  Normal auditory acuity. Cardiovascular:  S1, S2 present without murmurs appreciated. Extremities without clubbing or edema. Respiratory:  Clear to auscultation bilaterally. No wheezes, rales, or rhonchi. No distress.  Gastrointestinal:  +BS, soft, non-tender and non-distended. No HSM noted. No guarding or rebound. No masses appreciated.  Rectal:  Deferred  Musculoskalatal:  Symmetrical without gross deformities. Neurologic:  Alert and oriented x4;  grossly normal neurologically. Psych:  Alert and cooperative. Normal mood and affect. Heme/Lymph/Immune: No excessive bruising noted.    03/27/2019 11:12 AM   Disclaimer: This note was dictated with voice recognition software. Similar sounding words can inadvertently be transcribed and may not be corrected upon review.

## 2019-03-27 NOTE — Assessment & Plan Note (Signed)
The patient was referred due to rectal bleeding and anemia.  She has known hemorrhoids as likely cause of her rectal bleeding.  She has had a colonoscopy within the past year.  A repeat colonoscopy at this time is not generally indicated.  Continue to treat hemorrhoids as she has been in follow-up in 6 months.  Further anemia recommendations below.

## 2019-03-27 NOTE — Assessment & Plan Note (Signed)
History of known hemorrhoids.  Uses a home remedy (coconut oil) which she feels works best for her.  Rare flares of her hemorrhoid symptoms at which point she will used coconut oil.  When she has a flare she does have some mild rectal bleeding but this typically resolves.  Recommend she continue her current medications and follow-up in 6 months.

## 2019-04-10 DIAGNOSIS — Z23 Encounter for immunization: Secondary | ICD-10-CM | POA: Diagnosis not present

## 2019-04-20 DIAGNOSIS — E785 Hyperlipidemia, unspecified: Secondary | ICD-10-CM | POA: Diagnosis not present

## 2019-04-20 DIAGNOSIS — I10 Essential (primary) hypertension: Secondary | ICD-10-CM | POA: Diagnosis not present

## 2019-04-29 ENCOUNTER — Inpatient Hospital Stay (HOSPITAL_COMMUNITY): Payer: Medicare HMO

## 2019-04-29 ENCOUNTER — Other Ambulatory Visit: Payer: Self-pay

## 2019-04-29 ENCOUNTER — Inpatient Hospital Stay (HOSPITAL_COMMUNITY): Payer: Medicare HMO | Attending: Hematology | Admitting: Hematology

## 2019-04-29 ENCOUNTER — Encounter (HOSPITAL_COMMUNITY): Payer: Self-pay | Admitting: Hematology

## 2019-04-29 VITALS — BP 116/55 | HR 89 | Temp 96.9°F | Resp 16 | Wt 136.6 lb

## 2019-04-29 DIAGNOSIS — F17210 Nicotine dependence, cigarettes, uncomplicated: Secondary | ICD-10-CM | POA: Diagnosis not present

## 2019-04-29 DIAGNOSIS — Z9011 Acquired absence of right breast and nipple: Secondary | ICD-10-CM | POA: Insufficient documentation

## 2019-04-29 DIAGNOSIS — Z803 Family history of malignant neoplasm of breast: Secondary | ICD-10-CM | POA: Diagnosis not present

## 2019-04-29 DIAGNOSIS — M810 Age-related osteoporosis without current pathological fracture: Secondary | ICD-10-CM | POA: Diagnosis not present

## 2019-04-29 DIAGNOSIS — Z853 Personal history of malignant neoplasm of breast: Secondary | ICD-10-CM | POA: Diagnosis not present

## 2019-04-29 DIAGNOSIS — D509 Iron deficiency anemia, unspecified: Secondary | ICD-10-CM | POA: Diagnosis not present

## 2019-04-29 DIAGNOSIS — C50911 Malignant neoplasm of unspecified site of right female breast: Secondary | ICD-10-CM

## 2019-04-29 DIAGNOSIS — Z800 Family history of malignant neoplasm of digestive organs: Secondary | ICD-10-CM | POA: Diagnosis not present

## 2019-04-29 LAB — CBC WITH DIFFERENTIAL/PLATELET
Abs Immature Granulocytes: 0.02 10*3/uL (ref 0.00–0.07)
Basophils Absolute: 0.1 10*3/uL (ref 0.0–0.1)
Basophils Relative: 1 %
Eosinophils Absolute: 0.2 10*3/uL (ref 0.0–0.5)
Eosinophils Relative: 2 %
HCT: 35 % — ABNORMAL LOW (ref 36.0–46.0)
Hemoglobin: 9.9 g/dL — ABNORMAL LOW (ref 12.0–15.0)
Immature Granulocytes: 0 %
Lymphocytes Relative: 17 %
Lymphs Abs: 1.3 10*3/uL (ref 0.7–4.0)
MCH: 21.1 pg — ABNORMAL LOW (ref 26.0–34.0)
MCHC: 28.3 g/dL — ABNORMAL LOW (ref 30.0–36.0)
MCV: 74.5 fL — ABNORMAL LOW (ref 80.0–100.0)
Monocytes Absolute: 0.7 10*3/uL (ref 0.1–1.0)
Monocytes Relative: 9 %
Neutro Abs: 5.2 10*3/uL (ref 1.7–7.7)
Neutrophils Relative %: 71 %
Platelets: 249 10*3/uL (ref 150–400)
RBC: 4.7 MIL/uL (ref 3.87–5.11)
RDW: 20.2 % — ABNORMAL HIGH (ref 11.5–15.5)
WBC: 7.4 10*3/uL (ref 4.0–10.5)
nRBC: 0 % (ref 0.0–0.2)

## 2019-04-29 LAB — COMPREHENSIVE METABOLIC PANEL
ALT: 11 U/L (ref 0–44)
AST: 17 U/L (ref 15–41)
Albumin: 3.6 g/dL (ref 3.5–5.0)
Alkaline Phosphatase: 54 U/L (ref 38–126)
Anion gap: 8 (ref 5–15)
BUN: 7 mg/dL — ABNORMAL LOW (ref 8–23)
CO2: 22 mmol/L (ref 22–32)
Calcium: 8.5 mg/dL — ABNORMAL LOW (ref 8.9–10.3)
Chloride: 103 mmol/L (ref 98–111)
Creatinine, Ser: 0.64 mg/dL (ref 0.44–1.00)
GFR calc Af Amer: >60 mL/min (ref >60)
GFR calc non Af Amer: >60 mL/min (ref >60)
Glucose, Bld: 153 mg/dL — ABNORMAL HIGH (ref 70–99)
Potassium: 4.2 mmol/L (ref 3.5–5.1)
Sodium: 133 mmol/L — ABNORMAL LOW (ref 135–145)
Total Bilirubin: 0.4 mg/dL (ref 0.3–1.2)
Total Protein: 7.6 g/dL (ref 6.5–8.1)

## 2019-04-29 LAB — IRON AND TIBC
Iron: 14 ug/dL — ABNORMAL LOW (ref 28–170)
Saturation Ratios: 3 % — ABNORMAL LOW (ref 10.4–31.8)
TIBC: 479 ug/dL — ABNORMAL HIGH (ref 250–450)
UIBC: 465 ug/dL

## 2019-04-29 LAB — FOLATE: Folate: 23.4 ng/mL (ref >5.9)

## 2019-04-29 LAB — FERRITIN: Ferritin: 7 ng/mL — ABNORMAL LOW (ref 11–307)

## 2019-04-29 LAB — VITAMIN B12: Vitamin B-12: 433 pg/mL (ref 180–914)

## 2019-04-29 NOTE — Progress Notes (Signed)
[name], [place] [address]   CLINIC:  Medical Oncology/Hematology  PCP:  Rosita Fire, MD [place] [address] [phone]   REASON FOR VISIT:  Follow-up for right breast cancer.     CANCER STAGING: Cancer Staging Infiltrating ductal carcinoma of breast ([place]) Staging form: Breast, AJCC 7th Edition - Clinical: Stage IA (T1b, N0, cM0) - Signed by Pieter Partridge, MD on 01/16/2011 - Pathologic: No stage assigned - Unsigned    INTERVAL HISTORY:  [name] 78 y.o. female seen for follow-up of breast cancer, osteoporosis and iron deficiency anemia.  She has started taking iron pills but could not tolerate them secondary to gastric upset and constipation.  She is taking calcium and vitamin D supplements.  She started taking Prolia on 03/10/2019.  Denies any jaw pains.  Denies any reactions from it.  No new onset body pains noted.  REVIEW OF SYSTEMS:  Review of Systems  Psychiatric/Behavioral: Positive for sleep disturbance.  All other systems reviewed and are negative.    PAST MEDICAL/SURGICAL HISTORY:  Past Medical History:  Diagnosis Date  . Breast CA ([place]) 01/16/2011  . Breast cancer ([place])    rt dx 2010; mastectomy only  . Diverticulitis   . Embolism - blood clot    left lung  . History of prolapse of bladder    patient has gellhorn pessary  . Hypertension   . Infiltrating ductal carcinoma of breast ([place]) 01/16/2011   Started Arimidex on 02/24/2009. Stage IB (T1b N0 M0) grade 1 infiltrating ductal carcinoma of the right breast, status post right modified radical mastectomy on 01/22/2009 for a 10 mm cancer, ER +100%, PR +98%, KI-67 marker was 9%, HER-2 negative, no LVI identified, and 11 lymph nodes were negative, all margins were clear. She had some associated DCIS, is now on Arimidex and she will take that for  . Stroke ([place])   . Tendonitis 07/2010   shot of cortisone   Past Surgical History:   Procedure Laterality Date  . ABDOMINAL HYSTERECTOMY  1987  . APPENDECTOMY  1974   blood clot in lung  . BREAST BIOPSY  1968   left  . COLONOSCOPY N/A 01/11/2018   Procedure: COLONOSCOPY;  Surgeon: Danie Binder, MD;  Location: [facility];  Service: Endoscopy;  Laterality: N/A;  10:00  . MASTECTOMY MODIFIED RADICAL  01/22/2009   rt  . NASAL SINUS SURGERY  2002  . POLYPECTOMY  01/11/2018   Procedure: POLYPECTOMY;  Surgeon: Danie Binder, MD;  Location: [facility];  Service: Endoscopy;;  ascending; descending;  . TEAR DUCT PROBING    . tubal ligation  1972  . YAG LASER APPLICATION Right 9/50/9326   Procedure: YAG LASER APPLICATION;  Surgeon: Williams Che, MD;  Location: [facility] ORS;  Service: Ophthalmology;  Laterality: Right;     SOCIAL HISTORY:  Social History   Socioeconomic History  . Marital status: Divorced    Spouse name: Not on file  . Number of children: Not on file  . Years of education: Not on file  . Highest education level: Not on file  Occupational History  . Not on file  Social Needs  . Financial resource strain: Not on file  . Food insecurity    Worry: Not on file    Inability: Not on file  . Transportation needs    Medical: Not on file    Non-medical: Not on file  Tobacco Use  .  Smoking status: Current Every Day Smoker    Packs/day: 0.50    Years: 62.00    Pack years: 31.00    Types: Cigarettes  . Smokeless tobacco: Never Used  Substance and Sexual Activity  . Alcohol use: Yes    Comment: occ  . Drug use: No  . Sexual activity: Never    Birth control/protection: Surgical    Comment: hyst  Lifestyle  . Physical activity    Days per week: Not on file    Minutes per session: Not on file  . Stress: Not on file  Relationships  . Social Herbalist on phone: Not on file    Gets together: Not on file    Attends religious service: Not on file    Active member of club or organization: Not on file    Attends meetings of clubs or  organizations: Not on file    Relationship status: Not on file  . Intimate partner violence    Fear of current or ex partner: Not on file    Emotionally abused: Not on file    Physically abused: Not on file    Forced sexual activity: Not on file  Other Topics Concern  . Not on file  Social History Narrative  . Not on file    FAMILY HISTORY:  Family History  Problem Relation Age of Onset  . Breast cancer Sister   . Breast cancer Maternal Grandmother   . Colon cancer Daughter 72  . Hypertension Paternal Grandfather   . Stroke Paternal Grandfather   . Other Paternal Grandmother        hardening of arteries  . Other Father        gun shot wound  . Cancer Sister   . Cancer Daughter   . Stroke Son   . Heart attack Son   . Gastric cancer Neg Hx   . Esophageal cancer Neg Hx     CURRENT MEDICATIONS:  Outpatient Encounter Medications as of 04/29/2019  Medication Sig Note  . amLODipine (NORVASC) 5 MG tablet Take 5 mg by mouth daily.   Marland Kitchen aspirin 325 MG tablet Take 1 tablet (325 mg total) by mouth daily.   . Cyanocobalamin (VITAMIN B-12 IJ) Inject as directed.   . gabapentin (NEURONTIN) 300 MG capsule Take 300 mg by mouth 3 (three) times daily.   Marland Kitchen lovastatin (MEVACOR) 40 MG tablet Take 40 mg by mouth at bedtime.    . metFORMIN (GLUCOPHAGE) 500 MG tablet Take 500 mg by mouth 2 (two) times daily.  02/23/2016: Received from: External Pharmacy  . metoprolol tartrate (LOPRESSOR) 25 MG tablet Take 25 mg by mouth daily.    Marland Kitchen omeprazole (PRILOSEC) 20 MG capsule Take 1 capsule by mouth daily.   . TRADJENTA 5 MG TABS tablet Take 5 mg by mouth daily.  02/23/2016: Received from: External Pharmacy  . TRUE METRIX BLOOD GLUCOSE TEST test strip    . acetaminophen (TYLENOL) 650 MG CR tablet Take 650 mg by mouth as needed for pain.   Marland Kitchen albuterol (PROVENTIL HFA;VENTOLIN HFA) 108 (90 Base) MCG/ACT inhaler Inhale 1 puff into the lungs every 6 (six) hours as needed for wheezing or shortness of breath.   .  fluticasone (FLONASE) 50 MCG/ACT nasal spray Place 2 sprays into both nostrils as needed.  02/23/2016: Received from: External Pharmacy  . metroNIDAZOLE (METROGEL) 0.75 % vaginal gel Place 1 Applicatorful vaginally once a week. one applicatorful to vagina at bedtime weekly , as needed  for vaginal discharge with pessary (Patient not taking: Reported on 04/29/2019)   . naphazoline-glycerin (CLEAR EYES) 0.012-0.2 % SOLN Place 2 drops into both eyes daily as needed for irritation.   Marland Kitchen tiotropium (SPIRIVA) 18 MCG inhalation capsule Place 18 mcg into inhaler and inhale daily as needed.   . [DISCONTINUED] Ferrous Sulfate (IRON) 325 (65 Fe) MG TABS Take by mouth daily.     No facility-administered encounter medications on file as of 04/29/2019.     ALLERGIES:  Allergies  Allergen Reactions  . Pollen Extract Other (See Comments)    Runny nose, watery eyes and ears get stopped up  . Red Dye      PHYSICAL EXAM:  ECOG Performance status: 1  Vitals:   04/29/19 1405  BP: (!) 116/55  Pulse: 89  Resp: 16  Temp: (!) 96.9 F (36.1 C)  SpO2: 100%   Filed Weights   04/29/19 1405  Weight: 136 lb 9.6 oz (62 kg)    Physical Exam Vitals signs reviewed.  Constitutional:      Appearance: Normal appearance.  Cardiovascular:     Rate and Rhythm: Normal rate and regular rhythm.     Heart sounds: Normal heart sounds.  Pulmonary:     Effort: Pulmonary effort is normal.     Breath sounds: Normal breath sounds.  Abdominal:     General: There is no distension.     Palpations: Abdomen is soft. There is no mass.  Skin:    General: Skin is warm.  Neurological:     General: No focal deficit present.     Mental Status: She is alert and oriented to person, place, and time.  Psychiatric:        Mood and Affect: Mood normal.        Behavior: Behavior normal.    Right mastectomy site is within normal limits.  Left breast has no palpable masses.  No palpable adenopathy.  LABORATORY DATA:  I have  reviewed the labs as listed.  CBC    Component Value Date/Time   WBC 7.4 04/29/2019 1330   RBC 4.70 04/29/2019 1330   HGB 9.9 (L) 04/29/2019 1330   HCT 35.0 (L) 04/29/2019 1330   PLT 249 04/29/2019 1330   MCV 74.5 (L) 04/29/2019 1330   MCH 21.1 (L) 04/29/2019 1330   MCHC 28.3 (L) 04/29/2019 1330   RDW 20.2 (H) 04/29/2019 1330   LYMPHSABS 1.3 04/29/2019 1330   MONOABS 0.7 04/29/2019 1330   EOSABS 0.2 04/29/2019 1330   BASOSABS 0.1 04/29/2019 1330   CMP Latest Ref Rng & Units 04/29/2019 03/10/2019 02/26/2019  Glucose 70 - 99 mg/dL 153(H) 181(H) 104(H)  BUN 8 - 23 mg/dL 7(L) 14 10  Creatinine 0.44 - 1.00 mg/dL 0.64 0.78 0.75  Sodium 135 - 145 mmol/L 133(L) 136 136  Potassium 3.5 - 5.1 mmol/L 4.2 4.6 4.5  Chloride 98 - 111 mmol/L 103 101 102  CO2 22 - 32 mmol/L _0 Calcium 8.9 - 10.3 mg/dL 8.5(L) 9.4 9.2  Total Protein 6.5 - 8.1 g/dL 7.6 7.8 7.5  Total Bilirubin 0.3 - 1.2 mg/dL 0.4 0.5 0.6  Alkaline Phos 38 - 126 U/L 54 54 52  AST 15 - 41 U/L _1 ALT 0 - 44 U/L _2 DIAGNOSTIC IMAGING:  I have independently reviewed the scans and discussed with the patient.   I have reviewed Lauren Lick LPN's note and agree with the documentation.  I personally performed a face-to-face visit, made revisions and my assessment and plan is as follows.    ASSESSMENT & PLAN:   Infiltrating ductal carcinoma of breast 1.  Microcytic anemia: - Hemoglobin 9.9, MCV 74.  Ferritin is low at 7.  P20 and folic acid are normal. - She is taking iron tablet daily but could not tolerate secondary to nausea and stomach upset.  It also causes constipation. -I have recommended Feraheme infusion weekly x2.  We discussed the side effects in detail. -We will schedule her for infusions.  We will see her back in 2 months with repeat labs.  2.  Osteoporosis: -DEXA scan from 06/03/2018 shows T score of -2.5.  This has worsened since prior DEXA scan from 2015. - She took Fosamax for 1 or 2  doses but not tolerated. -We started her on Prolia every 6 months on 03/10/2019.  She tolerated it very well. -She was told to take calcium and vitamin D supplements.  Vitamin D on 02/26/2019 was 28.  3.  Stage I (T1BN0) grade 1 IDC of the right breast: -Status post right modified radical mastectomy on 01/22/2009, 1 cm IDC, ER/PR positive, Ki-67 9%, HER-2 negative, no LVSI, margins clear, 11 lymph nodes negative. -5 years of anastrozole completed in 2015. - Examination shows mastectomy site is within normal limits.  No palpable mass in the left breast.  No palpable adenopathy. - Last mammogram on 02/05/2019 was BI-RADS Category 1.   Total time spent is 25 minutes with more than 50% of the time spent face-to-face discussing treatment plan, counseling and coordination of care.  Orders placed this encounter:  Orders Placed This Encounter  Procedures  . CBC with Differential/Platelet  . Comprehensive metabolic panel  . Iron and TIBC  . Ferritin  . Vitamin B12  . Folate      Derek Jack, MD [place] [phone]

## 2019-04-29 NOTE — Assessment & Plan Note (Signed)
1.  Microcytic anemia: - Hemoglobin 9.9, MCV 74.  Ferritin is low at 7.  S56 and folic acid are normal. - She is taking iron tablet daily but could not tolerate secondary to nausea and stomach upset.  It also causes constipation. -I have recommended Feraheme infusion weekly x2.  We discussed the side effects in detail. -We will schedule her for infusions.  We will see her back in 2 months with repeat labs.  2.  Osteoporosis: -DEXA scan from 06/03/2018 shows T score of -2.5.  This has worsened since prior DEXA scan from 2015. - She took Fosamax for 1 or 2 doses but not tolerated. -We started her on Prolia every 6 months on 03/10/2019.  She tolerated it very well. -She was told to take calcium and vitamin D supplements.  Vitamin D on 02/26/2019 was 28.  3.  Stage I (T1BN0) grade 1 IDC of the right breast: -Status post right modified radical mastectomy on 01/22/2009, 1 cm IDC, ER/PR positive, Ki-67 9%, HER-2 negative, no LVSI, margins clear, 11 lymph nodes negative. -5 years of anastrozole completed in 2015. - Examination shows mastectomy site is within normal limits.  No palpable mass in the left breast.  No palpable adenopathy. - Last mammogram on 02/05/2019 was BI-RADS Category 1.

## 2019-04-29 NOTE — Patient Instructions (Signed)
[place] at [facility] Discharge Instructions  You were seen today by Dr. Delton Coombes. He went over your recent lab results. He will schedule you for 2 doses of Feraheme. He will see you back in 2 months for labs and follow up.   Thank you for choosing [place] at [facility] to provide your oncology and hematology care.  To afford each patient quality time with our provider, please arrive at least 15 minutes before your scheduled appointment time.   If you have a lab appointment with the [facility] please come in thru the  Main Entrance and check in at the main information desk  You need to re-schedule your appointment should you arrive 10 or more minutes late.  We strive to give you quality time with our providers, and arriving late affects you and other patients whose appointments are after yours.  Also, if you no show three or more times for appointments you may be dismissed from the clinic at the providers discretion.     Again, thank you for choosing [facility].  Our hope is that these requests will decrease the amount of time that you wait before being seen by our physicians.       _____________________________________________________________  Should you have questions after your visit to [facility], please contact our office at [phone] between the hours of 8:00 a.m. and 4:30 p.m.  Voicemails left after 4:00 p.m. will not be returned until the following business day.  For prescription refill requests, have your pharmacy contact our office and allow 72 hours.    Cancer Center Support Programs:   > Cancer Support Group  2nd Tuesday of the month 1pm-2pm, Journey Room

## 2019-05-06 ENCOUNTER — Telehealth: Payer: Self-pay | Admitting: Orthopedic Surgery

## 2019-05-06 NOTE — Telephone Encounter (Signed)
That must have been a long time ago, I can not find in her chart. I called her back, and she states she wants another injection that dr Aline Brochure gave her, not ESI.  He did a trigger point injection and this is what she is wanting  I transferred her back to front desk to schedule. She wants another trigger point injection. Okay to schedule.

## 2019-05-06 NOTE — Telephone Encounter (Signed)
Patient called inquiring about injections in her back, said as had been previously ordered by Dr Aline Brochure. I had asked patient at time of call if she had seen Dr Merlene Laughter as scheduled for the nerve conduction study. Said she had to cancel the visit, and has not been able to re-schedule. Patient mentioned concerns about her back. Please advise. [phone].

## 2019-05-07 ENCOUNTER — Other Ambulatory Visit: Payer: Self-pay

## 2019-05-07 ENCOUNTER — Inpatient Hospital Stay (HOSPITAL_COMMUNITY): Payer: Medicare HMO | Attending: Hematology

## 2019-05-07 ENCOUNTER — Encounter: Payer: Self-pay | Admitting: Orthopedic Surgery

## 2019-05-07 ENCOUNTER — Encounter (HOSPITAL_COMMUNITY): Payer: Self-pay

## 2019-05-07 ENCOUNTER — Ambulatory Visit: Payer: Medicare HMO | Admitting: Orthopedic Surgery

## 2019-05-07 VITALS — BP 120/64 | HR 87 | Ht 66.0 in | Wt 136.0 lb

## 2019-05-07 VITALS — BP 130/58 | HR 82 | Temp 97.1°F | Resp 18

## 2019-05-07 DIAGNOSIS — M25511 Pain in right shoulder: Secondary | ICD-10-CM | POA: Diagnosis not present

## 2019-05-07 DIAGNOSIS — D509 Iron deficiency anemia, unspecified: Secondary | ICD-10-CM | POA: Diagnosis not present

## 2019-05-07 DIAGNOSIS — M810 Age-related osteoporosis without current pathological fracture: Secondary | ICD-10-CM

## 2019-05-07 MED ORDER — SODIUM CHLORIDE 0.9 % IV SOLN
Freq: Once | INTRAVENOUS | Status: AC
  Administered 2019-05-07: 14:00:00 via INTRAVENOUS

## 2019-05-07 MED ORDER — SODIUM CHLORIDE 0.9 % IV SOLN
510.0000 mg | Freq: Once | INTRAVENOUS | Status: AC
  Administered 2019-05-07: 510 mg via INTRAVENOUS
  Filled 2019-05-07: qty 510

## 2019-05-07 NOTE — Telephone Encounter (Signed)
Called back to patient; scheduled accordingly. Patient aware.

## 2019-05-07 NOTE — Patient Instructions (Signed)
[facility] at [facility] Discharge Instructions  Received Feraheme infusion today. Follow-up as scheduled. Call clinic for any questions or concerns   Thank you for choosing  [facility] at [facility] to provide your oncology and hematology care.  To afford each patient quality time with our provider, please arrive at least 15 minutes before your scheduled appointment time.   If you have a lab appointment with the Cancer Center please come in thru the Main Entrance and check in at the main information desk.  You need to re-schedule your appointment should you arrive 10 or more minutes late.  We strive to give you quality time with our providers, and arriving late affects you and other patients whose appointments are after yours.  Also, if you no show three or more times for appointments you may be dismissed from the clinic at the providers discretion.     Again, thank you for choosing [facility].  Our hope is that these requests will decrease the amount of time that you wait before being seen by our physicians.       _____________________________________________________________  Should you have questions after your visit to [facility], please contact our office at [phone] between the hours of 8:00 a.m. and 4:30 p.m.  Voicemails left after 4:00 p.m. will not be returned until the following business day.  For prescription refill requests, have your pharmacy contact our office and allow 72 hours.    Due to Covid, you will need to wear a mask upon entering the hospital. If you do not have a mask, a mask will be given to you at the Main Entrance upon arrival. For doctor visits, patients may have 1 support person with them. For treatment visits, patients can not have anyone with them due to social distancing guidelines and our immunocompromised population.     

## 2019-05-07 NOTE — Progress Notes (Signed)
Latricia Heft tolerated Feraheme infusion well without complaints or incident. VSS upon discharge. Peripheral IV site checked with positive blood return noted prior to after infusion. Pt discharged self ambulatory in satisfactory condition

## 2019-05-07 NOTE — Patient Instructions (Signed)

## 2019-05-07 NOTE — Progress Notes (Signed)
Chief Complaint  Patient presents with  . Shoulder Pain    right scapular area/ wants another trigger point injection     New problem   Last time left shoulder was hurting  This time periscapular pain left side 4 weeks  Review of Systems  Neurological: Negative for tingling.    BP 120/64   Pulse 87   Ht 5\' 6"  (1.676 m)   Wt 136 lb (61.7 kg)   BMI 21.95 kg/m   Physical Exam Vitals signs and nursing note reviewed.  Constitutional:      Appearance: Normal appearance.  Musculoskeletal:       Arms:  Neurological:     Mental Status: She is alert and oriented to person, place, and time.  Psychiatric:        Mood and Affect: Mood normal.     Encounter Diagnosis  Name Primary?  . Trigger point of right shoulder region Yes    TRIGGER POINT INJECTION  Patient consented verbally for injection of the right  posterior/MEDIAL scapula. Timeout confirmed the site of injection A steroid injection was performed at inferior border of the right  scapula at the point of maximal tenderness using 1% plain Lidocaine and 40 mg of Depo-Medrol. This was well tolerated.

## 2019-05-14 ENCOUNTER — Inpatient Hospital Stay (HOSPITAL_COMMUNITY): Payer: Medicare HMO

## 2019-05-14 ENCOUNTER — Encounter (HOSPITAL_COMMUNITY): Payer: Self-pay

## 2019-05-14 ENCOUNTER — Other Ambulatory Visit: Payer: Self-pay

## 2019-05-14 VITALS — BP 121/55 | HR 80 | Temp 97.1°F | Resp 18

## 2019-05-14 DIAGNOSIS — M810 Age-related osteoporosis without current pathological fracture: Secondary | ICD-10-CM

## 2019-05-14 DIAGNOSIS — D509 Iron deficiency anemia, unspecified: Secondary | ICD-10-CM | POA: Diagnosis not present

## 2019-05-14 MED ORDER — SODIUM CHLORIDE 0.9 % IV SOLN
510.0000 mg | Freq: Once | INTRAVENOUS | Status: AC
  Administered 2019-05-14: 15:00:00 510 mg via INTRAVENOUS
  Filled 2019-05-14: qty 17

## 2019-05-14 MED ORDER — SODIUM CHLORIDE 0.9 % IV SOLN
Freq: Once | INTRAVENOUS | Status: AC
  Administered 2019-05-14: 15:00:00 via INTRAVENOUS

## 2019-05-14 NOTE — Progress Notes (Signed)
[name] tolerated Feraheme infusion well without complaints or incident.Peripheral IV site checked with positive blood return noted prior to and after infusion. VSS upon discharge. Pt discharged self ambulatory in satisfactory condition

## 2019-05-14 NOTE — Patient Instructions (Signed)
[facility] at [facility] Discharge Instructions  Received Feraheme infusion today. Follow-up as scheduled. Call clinic for any questions or concerns   Thank you for choosing [facility] at [facility] to provide your oncology and hematology care.  To afford each patient quality time with our provider, please arrive at least 15 minutes before your scheduled appointment time.   If you have a lab appointment with the Cancer Center please come in thru the Main Entrance and check in at the main information desk.  You need to re-schedule your appointment should you arrive 10 or more minutes late.  We strive to give you quality time with our providers, and arriving late affects you and other patients whose appointments are after yours.  Also, if you no show three or more times for appointments you may be dismissed from the clinic at the providers discretion.     Again, thank you for choosing [facility].  Our hope is that these requests will decrease the amount of time that you wait before being seen by our physicians.       _____________________________________________________________  Should you have questions after your visit to [facility], please contact our office at [phone] between the hours of 8:00 a.m. and 4:30 p.m.  Voicemails left after 4:00 p.m. will not be returned until the following business day.  For prescription refill requests, have your pharmacy contact our office and allow 72 hours.    Due to Covid, you will need to wear a mask upon entering the hospital. If you do not have a mask, a mask will be given to you at the Main Entrance upon arrival. For doctor visits, patients may have 1 support person with them. For treatment visits, patients can not have anyone with them due to social distancing guidelines and our immunocompromised population.     

## 2019-05-20 DIAGNOSIS — K219 Gastro-esophageal reflux disease without esophagitis: Secondary | ICD-10-CM | POA: Diagnosis not present

## 2019-05-20 DIAGNOSIS — J449 Chronic obstructive pulmonary disease, unspecified: Secondary | ICD-10-CM | POA: Diagnosis not present

## 2019-05-21 ENCOUNTER — Ambulatory Visit (HOSPITAL_COMMUNITY): Payer: Medicare HMO

## 2019-06-13 DIAGNOSIS — Z0001 Encounter for general adult medical examination with abnormal findings: Secondary | ICD-10-CM | POA: Diagnosis not present

## 2019-06-13 DIAGNOSIS — J449 Chronic obstructive pulmonary disease, unspecified: Secondary | ICD-10-CM | POA: Diagnosis not present

## 2019-06-13 DIAGNOSIS — F17210 Nicotine dependence, cigarettes, uncomplicated: Secondary | ICD-10-CM | POA: Diagnosis not present

## 2019-06-13 DIAGNOSIS — I10 Essential (primary) hypertension: Secondary | ICD-10-CM | POA: Diagnosis not present

## 2019-06-13 DIAGNOSIS — Z1389 Encounter for screening for other disorder: Secondary | ICD-10-CM | POA: Diagnosis not present

## 2019-06-13 DIAGNOSIS — E1142 Type 2 diabetes mellitus with diabetic polyneuropathy: Secondary | ICD-10-CM | POA: Diagnosis not present

## 2019-06-13 DIAGNOSIS — Z1331 Encounter for screening for depression: Secondary | ICD-10-CM | POA: Diagnosis not present

## 2019-06-24 ENCOUNTER — Inpatient Hospital Stay (HOSPITAL_COMMUNITY): Payer: Medicare HMO | Attending: Hematology

## 2019-06-24 ENCOUNTER — Other Ambulatory Visit: Payer: Self-pay

## 2019-06-24 ENCOUNTER — Inpatient Hospital Stay (HOSPITAL_COMMUNITY): Payer: Medicare HMO

## 2019-06-24 DIAGNOSIS — C50911 Malignant neoplasm of unspecified site of right female breast: Secondary | ICD-10-CM

## 2019-06-24 DIAGNOSIS — D509 Iron deficiency anemia, unspecified: Secondary | ICD-10-CM | POA: Insufficient documentation

## 2019-06-24 LAB — CBC WITH DIFFERENTIAL/PLATELET
Abs Immature Granulocytes: 0.02 10*3/uL (ref 0.00–0.07)
Basophils Absolute: 0 10*3/uL (ref 0.0–0.1)
Basophils Relative: 0 %
Eosinophils Absolute: 0.1 10*3/uL (ref 0.0–0.5)
Eosinophils Relative: 1 %
HCT: 40.5 % (ref 36.0–46.0)
Hemoglobin: 12.9 g/dL (ref 12.0–15.0)
Immature Granulocytes: 0 %
Lymphocytes Relative: 18 %
Lymphs Abs: 1.2 10*3/uL (ref 0.7–4.0)
MCH: 27.6 pg (ref 26.0–34.0)
MCHC: 31.9 g/dL (ref 30.0–36.0)
MCV: 86.5 fL (ref 80.0–100.0)
Monocytes Absolute: 0.6 10*3/uL (ref 0.1–1.0)
Monocytes Relative: 9 %
Neutro Abs: 4.6 10*3/uL (ref 1.7–7.7)
Neutrophils Relative %: 72 %
Platelets: 210 10*3/uL (ref 150–400)
RBC: 4.68 MIL/uL (ref 3.87–5.11)
RDW: 24.8 % — ABNORMAL HIGH (ref 11.5–15.5)
WBC: 6.4 10*3/uL (ref 4.0–10.5)
nRBC: 0 % (ref 0.0–0.2)

## 2019-06-24 LAB — IRON AND TIBC
Iron: 52 ug/dL (ref 28–170)
Saturation Ratios: 17 % (ref 10.4–31.8)
TIBC: 306 ug/dL (ref 250–450)
UIBC: 254 ug/dL

## 2019-06-24 LAB — COMPREHENSIVE METABOLIC PANEL
ALT: 10 U/L (ref 0–44)
AST: 15 U/L (ref 15–41)
Albumin: 3.6 g/dL (ref 3.5–5.0)
Alkaline Phosphatase: 50 U/L (ref 38–126)
Anion gap: 8 (ref 5–15)
BUN: 11 mg/dL (ref 8–23)
CO2: 26 mmol/L (ref 22–32)
Calcium: 8.7 mg/dL — ABNORMAL LOW (ref 8.9–10.3)
Chloride: 101 mmol/L (ref 98–111)
Creatinine, Ser: 0.67 mg/dL (ref 0.44–1.00)
GFR calc Af Amer: >60 mL/min (ref >60)
GFR calc non Af Amer: >60 mL/min (ref >60)
Glucose, Bld: 117 mg/dL — ABNORMAL HIGH (ref 70–99)
Potassium: 3.8 mmol/L (ref 3.5–5.1)
Sodium: 135 mmol/L (ref 135–145)
Total Bilirubin: 0.7 mg/dL (ref 0.3–1.2)
Total Protein: 7.4 g/dL (ref 6.5–8.1)

## 2019-06-24 LAB — VITAMIN B12: Vitamin B-12: 194 pg/mL (ref 180–914)

## 2019-06-24 LAB — FOLATE: Folate: 19 ng/mL (ref >5.9)

## 2019-06-24 LAB — FERRITIN: Ferritin: 89 ng/mL (ref 11–307)

## 2019-07-01 ENCOUNTER — Other Ambulatory Visit: Payer: Self-pay

## 2019-07-01 ENCOUNTER — Inpatient Hospital Stay (HOSPITAL_COMMUNITY): Payer: Medicare HMO | Attending: Hematology | Admitting: Hematology

## 2019-07-01 ENCOUNTER — Encounter (HOSPITAL_COMMUNITY): Payer: Self-pay | Admitting: Hematology

## 2019-07-01 DIAGNOSIS — D509 Iron deficiency anemia, unspecified: Secondary | ICD-10-CM | POA: Diagnosis not present

## 2019-07-01 DIAGNOSIS — C50911 Malignant neoplasm of unspecified site of right female breast: Secondary | ICD-10-CM | POA: Diagnosis not present

## 2019-07-01 DIAGNOSIS — M810 Age-related osteoporosis without current pathological fracture: Secondary | ICD-10-CM | POA: Diagnosis not present

## 2019-07-01 NOTE — Progress Notes (Signed)
Virtual Visit via Telephone Note  I connected with [name] on 07/01/19 at  2:50 PM EST by telephone and verified that I am speaking with the correct person using two identifiers.   I discussed the limitations, risks, security and privacy concerns of performing an evaluation and management service by telephone and the availability of in person appointments. I also discussed with the patient that there may be a patient responsible charge related to this service. The patient expressed understanding and agreed to proceed.   History of Present Illness: She is being followed in our clinic for microcytic anemia and has been receiving intermittent iron infusions.  She is also seen for osteoporosis and was started on Prolia on 03/10/2019.  She had a history of stage I right breast cancer and completed anastrozole in 2015.   Observations/Objective: She denies any bleeding per rectum.  Appetite is present.  Energy levels are 50%.  She has frequent bowel movements but denies any blood.  She felt improvement in her energy levels after her iron infusion.  Denies any headaches or vision changes.  No joint pains reported.  No dental problems.  No new onset pains.  No fevers, night sweats or weight loss.  Assessment and Plan:  1.  Iron deficiency anemia: -She took iron pills in the past without help.  This was discontinued at last visit on 04/29/2019. -Last Feraheme on 05/07/2019 and 05/14/2019. -We reviewed blood work from 06/24/2019 which showed hemoglobin of 12.9.  Ferritin improved to 89 from 7.  Percent saturation is 17. -She does not require any Feraheme at this time.  We will see her back in 3 months with repeat labs.  2.  Osteoporosis: -DEXA scan on 06/03/2018 shows T score of -2.5. -She started on Prolia every 6 months on 03/10/2019. -She was told to continue calcium and vitamin D supplements.  Will check vitamin D level prior to next visit.  3.  B12 deficiency: -Current B12 is 194.  She had taken  B12 in the past but stopped taking it. -As the level is borderline, I have recommended her to restart back on B12 1 mg tablet daily.  4.  Stage I (T1BN0) grade 1 IDC of right breast: -S/p modified radical mastectomy of the right side on 01/22/2009, 5 years of anastrozole completed in 2015. -Last mammogram on 02/05/2019 was BI-RADS Category 1.   Follow Up Instructions: RTC 3 months with labs 1 to 2 days prior.   I discussed the assessment and treatment plan with the patient. The patient was provided an opportunity to ask questions and all were answered. The patient agreed with the plan and demonstrated an understanding of the instructions.   The patient was advised to call back or seek an in-person evaluation if the symptoms worsen or if the condition fails to improve as anticipated.  I provided 12 minutes of non-face-to-face time during this encounter.   Derek Jack, MD

## 2019-07-03 DIAGNOSIS — E119 Type 2 diabetes mellitus without complications: Secondary | ICD-10-CM | POA: Diagnosis not present

## 2019-07-03 DIAGNOSIS — Z0100 Encounter for examination of eyes and vision without abnormal findings: Secondary | ICD-10-CM | POA: Diagnosis not present

## 2019-07-03 DIAGNOSIS — H524 Presbyopia: Secondary | ICD-10-CM | POA: Diagnosis not present

## 2019-07-13 DIAGNOSIS — J449 Chronic obstructive pulmonary disease, unspecified: Secondary | ICD-10-CM | POA: Diagnosis not present

## 2019-07-13 DIAGNOSIS — K219 Gastro-esophageal reflux disease without esophagitis: Secondary | ICD-10-CM | POA: Diagnosis not present

## 2019-08-13 DIAGNOSIS — E1142 Type 2 diabetes mellitus with diabetic polyneuropathy: Secondary | ICD-10-CM | POA: Diagnosis not present

## 2019-08-13 DIAGNOSIS — K219 Gastro-esophageal reflux disease without esophagitis: Secondary | ICD-10-CM | POA: Diagnosis not present

## 2019-08-18 ENCOUNTER — Ambulatory Visit: Payer: Medicare HMO | Admitting: Orthopedic Surgery

## 2019-08-20 ENCOUNTER — Other Ambulatory Visit: Payer: Self-pay

## 2019-08-20 ENCOUNTER — Encounter: Payer: Self-pay | Admitting: Orthopedic Surgery

## 2019-08-20 ENCOUNTER — Ambulatory Visit (INDEPENDENT_AMBULATORY_CARE_PROVIDER_SITE_OTHER): Payer: Medicare HMO | Admitting: Orthopedic Surgery

## 2019-08-20 VITALS — BP 121/68 | HR 86 | Ht 66.0 in | Wt 136.0 lb

## 2019-08-20 DIAGNOSIS — M25511 Pain in right shoulder: Secondary | ICD-10-CM | POA: Diagnosis not present

## 2019-08-20 NOTE — Progress Notes (Signed)
79 year old female history of trigger point right shoulder previous injection, seem to do well for about 3 months then started having pain same place  Exam shows tenderness in the right parascapular region  We reinjected this region  Follow-up as needed  [place]  Patient consented verbally for injection of the right posterior/MEDIAL scapula. Timeout confirmed the site of injection A steroid injection was performed at inferior border of the right  scapula at the point of maximal tenderness using 1% plain Lidocaine and 40 mg of Depo-Medrol. This was well tolerated.  Encounter Diagnosis  Name Primary?  . Trigger point of right shoulder region Yes

## 2019-08-20 NOTE — Patient Instructions (Signed)

## 2019-09-04 ENCOUNTER — Other Ambulatory Visit: Payer: Self-pay

## 2019-09-04 ENCOUNTER — Inpatient Hospital Stay (HOSPITAL_COMMUNITY): Payer: Medicare HMO | Attending: Hematology

## 2019-09-04 DIAGNOSIS — D509 Iron deficiency anemia, unspecified: Secondary | ICD-10-CM | POA: Diagnosis not present

## 2019-09-04 DIAGNOSIS — M810 Age-related osteoporosis without current pathological fracture: Secondary | ICD-10-CM | POA: Diagnosis not present

## 2019-09-04 DIAGNOSIS — C50911 Malignant neoplasm of unspecified site of right female breast: Secondary | ICD-10-CM

## 2019-09-04 LAB — COMPREHENSIVE METABOLIC PANEL
ALT: 11 U/L (ref 0–44)
AST: 15 U/L (ref 15–41)
Albumin: 3.5 g/dL (ref 3.5–5.0)
Alkaline Phosphatase: 57 U/L (ref 38–126)
Anion gap: 12 (ref 5–15)
BUN: 7 mg/dL — ABNORMAL LOW (ref 8–23)
CO2: 25 mmol/L (ref 22–32)
Calcium: 9.3 mg/dL (ref 8.9–10.3)
Chloride: 98 mmol/L (ref 98–111)
Creatinine, Ser: 0.68 mg/dL (ref 0.44–1.00)
GFR calc Af Amer: >60 mL/min (ref >60)
GFR calc non Af Amer: >60 mL/min (ref >60)
Glucose, Bld: 195 mg/dL — ABNORMAL HIGH (ref 70–99)
Potassium: 3.9 mmol/L (ref 3.5–5.1)
Sodium: 135 mmol/L (ref 135–145)
Total Bilirubin: 0.6 mg/dL (ref 0.3–1.2)
Total Protein: 6.9 g/dL (ref 6.5–8.1)

## 2019-09-11 ENCOUNTER — Ambulatory Visit (HOSPITAL_COMMUNITY): Payer: Medicare HMO

## 2019-09-11 ENCOUNTER — Ambulatory Visit (HOSPITAL_COMMUNITY): Payer: Medicare HMO | Admitting: Hematology

## 2019-09-13 DIAGNOSIS — I10 Essential (primary) hypertension: Secondary | ICD-10-CM | POA: Diagnosis not present

## 2019-09-13 DIAGNOSIS — E1142 Type 2 diabetes mellitus with diabetic polyneuropathy: Secondary | ICD-10-CM | POA: Diagnosis not present

## 2019-09-30 ENCOUNTER — Other Ambulatory Visit: Payer: Self-pay

## 2019-09-30 ENCOUNTER — Inpatient Hospital Stay (HOSPITAL_COMMUNITY): Payer: Medicare HMO | Attending: Hematology

## 2019-09-30 ENCOUNTER — Inpatient Hospital Stay (HOSPITAL_COMMUNITY): Payer: Medicare HMO

## 2019-09-30 ENCOUNTER — Encounter (HOSPITAL_COMMUNITY): Payer: Self-pay

## 2019-09-30 VITALS — BP 138/58 | HR 82 | Temp 96.9°F | Resp 18

## 2019-09-30 DIAGNOSIS — Z7982 Long term (current) use of aspirin: Secondary | ICD-10-CM | POA: Insufficient documentation

## 2019-09-30 DIAGNOSIS — M810 Age-related osteoporosis without current pathological fracture: Secondary | ICD-10-CM | POA: Diagnosis not present

## 2019-09-30 DIAGNOSIS — Z90710 Acquired absence of both cervix and uterus: Secondary | ICD-10-CM | POA: Insufficient documentation

## 2019-09-30 DIAGNOSIS — J449 Chronic obstructive pulmonary disease, unspecified: Secondary | ICD-10-CM | POA: Insufficient documentation

## 2019-09-30 DIAGNOSIS — F17210 Nicotine dependence, cigarettes, uncomplicated: Secondary | ICD-10-CM | POA: Insufficient documentation

## 2019-09-30 DIAGNOSIS — Z853 Personal history of malignant neoplasm of breast: Secondary | ICD-10-CM | POA: Diagnosis not present

## 2019-09-30 DIAGNOSIS — E538 Deficiency of other specified B group vitamins: Secondary | ICD-10-CM | POA: Insufficient documentation

## 2019-09-30 DIAGNOSIS — Z8673 Personal history of transient ischemic attack (TIA), and cerebral infarction without residual deficits: Secondary | ICD-10-CM | POA: Insufficient documentation

## 2019-09-30 DIAGNOSIS — Z803 Family history of malignant neoplasm of breast: Secondary | ICD-10-CM | POA: Diagnosis not present

## 2019-09-30 DIAGNOSIS — Z86711 Personal history of pulmonary embolism: Secondary | ICD-10-CM | POA: Insufficient documentation

## 2019-09-30 DIAGNOSIS — R197 Diarrhea, unspecified: Secondary | ICD-10-CM | POA: Insufficient documentation

## 2019-09-30 DIAGNOSIS — Z9011 Acquired absence of right breast and nipple: Secondary | ICD-10-CM | POA: Diagnosis not present

## 2019-09-30 DIAGNOSIS — D509 Iron deficiency anemia, unspecified: Secondary | ICD-10-CM

## 2019-09-30 DIAGNOSIS — Z800 Family history of malignant neoplasm of digestive organs: Secondary | ICD-10-CM | POA: Insufficient documentation

## 2019-09-30 LAB — CBC WITH DIFFERENTIAL/PLATELET
Abs Immature Granulocytes: 0.02 10*3/uL (ref 0.00–0.07)
Basophils Absolute: 0 10*3/uL (ref 0.0–0.1)
Basophils Relative: 0 %
Eosinophils Absolute: 0.1 10*3/uL (ref 0.0–0.5)
Eosinophils Relative: 1 %
HCT: 38 % (ref 36.0–46.0)
Hemoglobin: 12 g/dL (ref 12.0–15.0)
Immature Granulocytes: 0 %
Lymphocytes Relative: 14 %
Lymphs Abs: 1.1 10*3/uL (ref 0.7–4.0)
MCH: 29.1 pg (ref 26.0–34.0)
MCHC: 31.6 g/dL (ref 30.0–36.0)
MCV: 92 fL (ref 80.0–100.0)
Monocytes Absolute: 0.5 10*3/uL (ref 0.1–1.0)
Monocytes Relative: 6 %
Neutro Abs: 6.4 10*3/uL (ref 1.7–7.7)
Neutrophils Relative %: 79 %
Platelets: 228 10*3/uL (ref 150–400)
RBC: 4.13 MIL/uL (ref 3.87–5.11)
RDW: 14.3 % (ref 11.5–15.5)
WBC: 8.1 10*3/uL (ref 4.0–10.5)
nRBC: 0 % (ref 0.0–0.2)

## 2019-09-30 LAB — IRON AND TIBC
Iron: 53 ug/dL (ref 28–170)
Saturation Ratios: 17 % (ref 10.4–31.8)
TIBC: 315 ug/dL (ref 250–450)
UIBC: 262 ug/dL

## 2019-09-30 LAB — FERRITIN: Ferritin: 29 ng/mL (ref 11–307)

## 2019-09-30 LAB — VITAMIN B12: Vitamin B-12: 289 pg/mL (ref 180–914)

## 2019-09-30 LAB — VITAMIN D 25 HYDROXY (VIT D DEFICIENCY, FRACTURES): Vit D, 25-Hydroxy: 36.97 ng/mL (ref 30–100)

## 2019-09-30 MED ORDER — DENOSUMAB 60 MG/ML ~~LOC~~ SOSY
PREFILLED_SYRINGE | SUBCUTANEOUS | Status: AC
  Filled 2019-09-30: qty 1

## 2019-09-30 MED ORDER — DENOSUMAB 60 MG/ML ~~LOC~~ SOSY
60.0000 mg | PREFILLED_SYRINGE | Freq: Once | SUBCUTANEOUS | Status: AC
  Administered 2019-09-30: 60 mg via SUBCUTANEOUS

## 2019-09-30 NOTE — Progress Notes (Signed)
[name] tolerated Prolia injection well without complaints or incident. Calcium 9.3 from 09/04/19 reviewed with RLocakmy NP and pt was approved for Prolia injection today per NP. VSS Pt denied any tooth or jaw pain and no recent or future dental visits prior to administering this medication. Pt continues to take her calcium PO as prescribed without issues. Pt discharged self ambulatory in satisfactory condition

## 2019-09-30 NOTE — Patient Instructions (Signed)
[place] at [facility] Discharge Instructions  Received Prolia injection today. Follow-up as scheduled. Call clinic for any questions or concerns   Thank you for choosing [place] at [facility] to provide your oncology and hematology care.  To afford each patient quality time with our provider, please arrive at least 15 minutes before your scheduled appointment time.   If you have a lab appointment with the [facility] please come in thru the Main Entrance and check in at the main information desk.  You need to re-schedule your appointment should you arrive 10 or more minutes late.  We strive to give you quality time with our providers, and arriving late affects you and other patients whose appointments are after yours.  Also, if you no show three or more times for appointments you may be dismissed from the clinic at the providers discretion.     Again, thank you for choosing [facility].  Our hope is that these requests will decrease the amount of time that you wait before being seen by our physicians.       _____________________________________________________________  Should you have questions after your visit to [facility], please contact our office at [phone] between the hours of 8:00 a.m. and 4:30 p.m.  Voicemails left after 4:00 p.m. will not be returned until the following business day.  For prescription refill requests, have your pharmacy contact our office and allow 72 hours.    Due to Covid, you will need to wear a mask upon entering the hospital. If you do not have a mask, a mask will be given to you at the Main Entrance upon arrival. For doctor visits, patients may have 1 support person with them. For treatment visits, patients can not have anyone with them due to social distancing guidelines and our immunocompromised population.

## 2019-10-01 ENCOUNTER — Encounter: Payer: Self-pay | Admitting: Nurse Practitioner

## 2019-10-01 ENCOUNTER — Ambulatory Visit: Payer: Medicare HMO | Admitting: Nurse Practitioner

## 2019-10-01 VITALS — BP 141/76 | HR 84 | Temp 96.9°F | Ht 66.0 in | Wt 130.6 lb

## 2019-10-01 DIAGNOSIS — R197 Diarrhea, unspecified: Secondary | ICD-10-CM

## 2019-10-01 DIAGNOSIS — K625 Hemorrhage of anus and rectum: Secondary | ICD-10-CM

## 2019-10-01 DIAGNOSIS — K649 Unspecified hemorrhoids: Secondary | ICD-10-CM | POA: Diagnosis not present

## 2019-10-01 NOTE — Progress Notes (Signed)
Referring Provider: Rosita Fire, MD Primary Care Physician:  Rosita Fire, MD Primary GI:  Dr. Oneida Alar  Chief Complaint  Patient presents with  . Hemorrhoids    no issues currently, no bleeding in 3 months  . Diarrhea    several times during the day/night    HPI:   [name] is a 79 y.o. female who presents for follow-up on hemorrhoids and rectal bleeding.  The patient was last seen in our office 03/27/2019 for IDA, hemorrhoids, rectal bleeding.  Previously noted mild anemia with hemoglobin at 9.3 found to be microcytic and hypochromic with PCP labs.  Colonoscopy up-to-date 01/11/2018 with two 7 mm polyps in the ascending colon, single 8 mm polyp in the ascending colon, diverticulosis, internal hemorrhoids, significant looping of the colon.  Surgical pathology found the polyps to be tubular adenoma and recommended Preparation H 4 times a day as needed, high-fiber diet, no future colonoscopy unless new symptoms develop.  Currently following with oncology for infiltrating ductal carcinoma of the right breast.  Chronic IDA with ferritin at 5, 4% saturation.  Vitamin B12 is also low.  Anemia edema likely multifactorial in nature.  Consider parenteral iron therapy if no improvement.  At her last visit she noted hemorrhoids and uses coconut oil which she feels helps.  Hematochezia "not often at all" and was started on iron by oncology due to low iron/ferritin.  Also on vitamin B12.  No overt bleeding noted.  No GERD symptoms, currently on Prilosec and Rolaids as needed for breakthrough symptoms.  Daily aspirin due to what was described as likely TIA a few years ago, no other NSAIDs.  No other overt GI complaints.  Declines EGD at her last visit.  Recommended continue current medications for hemorrhoid symptoms, notify us of any worsening hemorrhoid symptoms, continue to follow with hematology/oncology, continue iron supplements, follow-up in 6 months.  Most recent CBC completed yesterday  found hemoglobin normalized at 12.0.  Iron studies all normal with saturation increased to 17%, vitamin B12 normal at 289, ferritin normal at 29.  Today she states she's doing ok overall. Has had diarrhea for about the past month. Is on Metformin but has been on this for about 6-7 years. PCP gave Rx for Lomotil which helps some, but causes some cramps. No stool studies completed. Having multiple stools a day, about 3-4 times in the morning (can't leave the house until about noon) and during the night has a bowel movement about every 1-1.5 hrs. Decreased appetite with this. Denies abdominal pain, N/V, hematochezia. Has dark stools on iron. Denies fever, chills, unintentional weight loss. Denies URI or flu-like symptoms. Denies loss of sense of taste or smell. Has not been tested for COVID-19. Has an appointment on Friday for her first vaccine at [facility]. Denies chest pain, dyspnea, dizziness, lightheadedness, syncope, near syncope. Denies any other upper or lower GI symptoms.  Past Medical History:  Diagnosis Date  . Breast CA ([place]) 01/16/2011  . Breast cancer ([place])    rt dx 2010; mastectomy only  . Diverticulitis   . Embolism - blood clot    left lung  . History of prolapse of bladder    patient has gellhorn pessary  . Hypertension   . Infiltrating ductal carcinoma of breast ([place]) 01/16/2011   Started Arimidex on 02/24/2009. Stage IB (T1b N0 M0) grade 1 infiltrating ductal carcinoma of the right breast, status post right modified radical mastectomy on 01/22/2009 for a 10 mm cancer, ER +100%,  PR +98%, KI-67 marker was 9%, HER-2 negative, no LVI identified, and 11 lymph nodes were negative, all margins were clear. She had some associated DCIS, is now on Arimidex and she will take that for  . Stroke ([place])   . Tendonitis 07/2010   shot of cortisone    Past Surgical History:  Procedure Laterality Date  . ABDOMINAL HYSTERECTOMY  1987  . APPENDECTOMY  1974   blood clot in lung  .  BREAST BIOPSY  1968   left  . COLONOSCOPY N/A 01/11/2018   Procedure: COLONOSCOPY;  Surgeon: Danie Binder, MD;  Location: [facility] ENDO SUITE;  Service: Endoscopy;  Laterality: N/A;  10:00  . MASTECTOMY MODIFIED RADICAL  01/22/2009   rt  . NASAL SINUS SURGERY  2002  . POLYPECTOMY  01/11/2018   Procedure: POLYPECTOMY;  Surgeon: Danie Binder, MD;  Location: [facility] ENDO SUITE;  Service: Endoscopy;;  ascending; descending;  . TEAR DUCT PROBING    . tubal ligation  1972  . YAG LASER APPLICATION Right 12/26/4130   Procedure: YAG LASER APPLICATION;  Surgeon: Williams Che, MD;  Location: [facility] ORS;  Service: Ophthalmology;  Laterality: Right;    Current Outpatient Medications  Medication Sig Dispense Refill  . acetaminophen (TYLENOL) 650 MG CR tablet Take 650 mg by mouth as needed for pain.    Marland Kitchen albuterol (PROVENTIL HFA;VENTOLIN HFA) 108 (90 Base) MCG/ACT inhaler Inhale 1 puff into the lungs every 6 (six) hours as needed for wheezing or shortness of breath.    Marland Kitchen amLODipine (NORVASC) 5 MG tablet Take 5 mg by mouth daily.    Marland Kitchen aspirin 325 MG tablet Take 1 tablet (325 mg total) by mouth daily. 30 tablet 3  . calcium carbonate (OSCAL) 1500 (600 Ca) MG TABS tablet Take 600 mg of elemental calcium by mouth 2 (two) times daily with a meal.    . diphenoxylate-atropine (LOMOTIL) 2.5-0.025 MG tablet Take 1 tablet by mouth every 6 (six) hours as needed.     . ferrous sulfate 325 (65 FE) MG tablet Take 325 mg by mouth daily with breakfast.    . fluticasone (FLONASE) 50 MCG/ACT nasal spray Place 2 sprays into both nostrils as needed.     . gabapentin (NEURONTIN) 600 MG tablet Take 600 mg by mouth 3 (three) times daily.     Marland Kitchen lovastatin (MEVACOR) 40 MG tablet Take 40 mg by mouth at bedtime.     . metFORMIN (GLUCOPHAGE) 500 MG tablet Take 500 mg by mouth 2 (two) times daily.     . metoprolol tartrate (LOPRESSOR) 25 MG tablet Take 25 mg by mouth daily.     . metroNIDAZOLE (METROGEL) 0.75 % vaginal gel Place 1  Applicatorful vaginally once a week. one applicatorful to vagina at bedtime weekly , as needed for vaginal discharge with pessary 70 g 4  . naphazoline-glycerin (CLEAR EYES) 0.012-0.2 % SOLN Place 2 drops into both eyes daily as needed for irritation.    Marland Kitchen omeprazole (PRILOSEC) 20 MG capsule Take 1 capsule by mouth daily.    . SYMBICORT 160-4.5 MCG/ACT inhaler Inhale 2 puffs into the lungs as needed.     . tiotropium (SPIRIVA) 18 MCG inhalation capsule Place 18 mcg into inhaler and inhale daily as needed.    . TRADJENTA 5 MG TABS tablet Take 5 mg by mouth daily.     . TRUE METRIX BLOOD GLUCOSE TEST test strip      No current facility-administered medications for this visit.  Allergies as of 10/01/2019 - Review Complete 10/01/2019  Allergen Reaction Noted  . Pollen extract Other (See Comments) 02/10/2014  . Red dye  01/22/2019    Family History  Problem Relation Age of Onset  . Breast cancer Sister   . Breast cancer Maternal Grandmother   . Colon cancer Daughter 14  . Hypertension Paternal Grandfather   . Stroke Paternal Grandfather   . Other Paternal Grandmother        hardening of arteries  . Other Father        gun shot wound  . Cancer Sister   . Cancer Daughter   . Stroke Son   . Heart attack Son   . Gastric cancer Neg Hx   . Esophageal cancer Neg Hx     Social History   Socioeconomic History  . Marital status: Divorced    Spouse name: Not on file  . Number of children: Not on file  . Years of education: Not on file  . Highest education level: Not on file  Occupational History  . Not on file  Tobacco Use  . Smoking status: Current Every Day Smoker    Packs/day: 0.50    Years: 62.00    Pack years: 31.00    Types: Cigarettes  . Smokeless tobacco: Never Used  Substance and Sexual Activity  . Alcohol use: Yes    Comment: occ  . Drug use: No  . Sexual activity: Never    Birth control/protection: Surgical    Comment: hyst  Other Topics Concern  . Not on  file  Social History Narrative  . Not on file   Social Determinants of Health   Financial Resource Strain:   . Difficulty of Paying Living Expenses: Not on file  Food Insecurity:   . Worried About Charity fundraiser in the Last Year: Not on file  . Ran Out of Food in the Last Year: Not on file  Transportation Needs:   . Lack of Transportation (Medical): Not on file  . Lack of Transportation (Non-Medical): Not on file  Physical Activity:   . Days of Exercise per Week: Not on file  . Minutes of Exercise per Session: Not on file  Stress:   . Feeling of Stress : Not on file  Social Connections:   . Frequency of Communication with Friends and Family: Not on file  . Frequency of Social Gatherings with Friends and Family: Not on file  . Attends Religious Services: Not on file  . Active Member of Clubs or Organizations: Not on file  . Attends Archivist Meetings: Not on file  . Marital Status: Not on file    Review of Systems: General: Negative for anorexia, weight loss, fever, chills, fatigue, weakness. ENT: Negative for hoarseness, difficulty swallowing. CV: Negative for chest pain, angina, palpitations, peripheral edema.  Respiratory: Negative for dyspnea at rest, cough, sputum, wheezing.  GI: See history of present illness. Endo: Negative for unusual weight change.  Heme: Negative for bruising or bleeding. Allergy: Negative for rash or hives.   Physical Exam: BP (!) 141/76   Pulse 84   Temp (!) 96.9 F (36.1 C) (Oral)   Ht 5' 6"  (1.676 m)   Wt 130 lb 9.6 oz (59.2 kg)   BMI 21.08 kg/m  General:   Alert and oriented. Pleasant and cooperative. Well-nourished and well-developed.  Eyes:  Without icterus, sclera clear and conjunctiva pink.  Ears:  Normal auditory acuity. Cardiovascular:  S1, S2 present without murmurs  appreciated. Extremities without clubbing or edema. Respiratory:  Clear to auscultation bilaterally. No wheezes, rales, or rhonchi. No distress.    Gastrointestinal:  +BS, soft, non-tender and non-distended. No HSM noted. No guarding or rebound. No masses appreciated.  Rectal:  Deferred  Musculoskalatal:  Symmetrical without gross deformities. Neurologic:  Alert and oriented x4;  grossly normal neurologically. Psych:  Alert and cooperative. Normal mood and affect. Heme/Lymph/Immune: No excessive bruising noted.    10/01/2019 10:57 AM   Disclaimer: This note was dictated with voice recognition software. Similar sounding words can inadvertently be transcribed and may not be corrected upon review.

## 2019-10-01 NOTE — Patient Instructions (Signed)
Your health issues we discussed today were:   Hemorrhoids and rectal bleeding: 1. I am glad you are doing better! 2. Continue to use Preparation H as needed for hemorrhoid symptoms 3. Let us know if you have worsening hemorrhoid symptoms or if you see any further bleeding  Diarrhea: 1. Collect your stool studies and take them to the lab as soon as you can 2. Once you have collected your stool studies, start taking an over-the-counter probiotic.  Some brands we have had recent success with include Align, Restora, Florastor, [employer].  You can also ask the pharmacist for recommendations 3. We will call you when we get the results of your stool studies 4. Further recommendations will follow your results 5. Make sure you are drinking plenty of fluids while you are having diarrhea 6. Call us if you have any worsening or severe symptoms  Overall I recommend:  1. Continue your other current medications 2. Return for follow-up in 2 months 3. Call us if you have any questions or concerns   ---------------------------------------------------------------  I am glad you have your appointment for your COVID-19/coronavirus vaccination!  Keep your appointment for your vaccination and follow-up with the appointment that they gave you for your second dose.  ---------------------------------------------------------------   At [facility] we value your feedback. You may receive a survey about your visit today. Please share your experience as we strive to create trusting relationships with our patients to provide genuine, compassionate, quality care.  We appreciate your understanding and patience as we review any laboratory studies, imaging, and other diagnostic tests that are ordered as we care for you. Our office policy is 5 business days for review of these results, and any emergent or urgent results are addressed in a timely manner for your best interest. If you do not hear  from our office in 1 week, please contact us.   We also encourage the use of MyChart, which contains your medical information for your review as well. If you are not enrolled in this feature, an access code is on this after visit summary for your convenience. Thank you for allowing [place] to be involved in your care.  It was great to see you today!  I hope you have a great day!!

## 2019-10-01 NOTE — Assessment & Plan Note (Signed)
New onset diarrhea started about a month ago.  She is on Metformin but she has been on this for 6 to 7 years and less likely to be a factor.  She is having some decreased appetite and fatigue with persistent diarrhea.  She has multiple stools today.  This point I will check stool studies.  After she collects her sample I will have her start a probiotic daily for at least 1 to 2 months.  Further recommendations to follow her stool studies.  Lomotil somewhat effective but not much.  If she does not have any infections on her stool studies we can make further recommendations further medication such as Imodium, Bentyl, etc.  Follow-up in 2 months.

## 2019-10-01 NOTE — Assessment & Plan Note (Signed)
No recent rectal bleeding in the past several months.  Recommend she continue to monitor and let us know if she has any further rectal bleeding.  Colonoscopy up-to-date 2019 with polyps found to be tubular adenoma and no future colonoscopy unless symptoms develop.

## 2019-10-01 NOTE — Assessment & Plan Note (Signed)
Hemorrhoid symptoms well managed on Preparation H.  Recommended she call and let us know if she has worsening hemorrhoid symptoms we can consider further management such as Anusol.  Follow-up in 2 months.

## 2019-10-02 ENCOUNTER — Ambulatory Visit: Payer: Medicare HMO | Admitting: Nurse Practitioner

## 2019-10-03 DIAGNOSIS — K649 Unspecified hemorrhoids: Secondary | ICD-10-CM | POA: Diagnosis not present

## 2019-10-03 DIAGNOSIS — K625 Hemorrhage of anus and rectum: Secondary | ICD-10-CM | POA: Diagnosis not present

## 2019-10-03 DIAGNOSIS — R197 Diarrhea, unspecified: Secondary | ICD-10-CM | POA: Diagnosis not present

## 2019-10-07 ENCOUNTER — Other Ambulatory Visit: Payer: Self-pay

## 2019-10-07 ENCOUNTER — Encounter (HOSPITAL_COMMUNITY): Payer: Self-pay | Admitting: Hematology

## 2019-10-07 ENCOUNTER — Inpatient Hospital Stay (HOSPITAL_COMMUNITY): Payer: Medicare HMO | Admitting: Hematology

## 2019-10-07 VITALS — BP 122/47 | HR 91 | Temp 96.9°F | Resp 16 | Wt 131.2 lb

## 2019-10-07 DIAGNOSIS — D509 Iron deficiency anemia, unspecified: Secondary | ICD-10-CM | POA: Diagnosis not present

## 2019-10-07 DIAGNOSIS — R197 Diarrhea, unspecified: Secondary | ICD-10-CM | POA: Diagnosis not present

## 2019-10-07 DIAGNOSIS — J449 Chronic obstructive pulmonary disease, unspecified: Secondary | ICD-10-CM | POA: Diagnosis not present

## 2019-10-07 DIAGNOSIS — M810 Age-related osteoporosis without current pathological fracture: Secondary | ICD-10-CM | POA: Diagnosis not present

## 2019-10-07 DIAGNOSIS — Z9011 Acquired absence of right breast and nipple: Secondary | ICD-10-CM | POA: Diagnosis not present

## 2019-10-07 DIAGNOSIS — Z853 Personal history of malignant neoplasm of breast: Secondary | ICD-10-CM | POA: Diagnosis not present

## 2019-10-07 DIAGNOSIS — E538 Deficiency of other specified B group vitamins: Secondary | ICD-10-CM | POA: Diagnosis not present

## 2019-10-07 DIAGNOSIS — Z86711 Personal history of pulmonary embolism: Secondary | ICD-10-CM | POA: Diagnosis not present

## 2019-10-07 DIAGNOSIS — F17210 Nicotine dependence, cigarettes, uncomplicated: Secondary | ICD-10-CM | POA: Diagnosis not present

## 2019-10-07 LAB — GASTROINTESTINAL PATHOGEN PANEL PCR
C. difficile Tox A/B, PCR: NOT DETECTED
Campylobacter, PCR: NOT DETECTED
Cryptosporidium, PCR: NOT DETECTED
E coli (ETEC) LT/ST PCR: NOT DETECTED
E coli (STEC) stx1/stx2, PCR: NOT DETECTED
E coli 0157, PCR: NOT DETECTED
Giardia lamblia, PCR: NOT DETECTED
Norovirus, PCR: NOT DETECTED
Rotavirus A, PCR: NOT DETECTED
Salmonella, PCR: NOT DETECTED
Shigella, PCR: NOT DETECTED

## 2019-10-07 LAB — C. DIFFICILE GDH AND TOXIN A/B
GDH ANTIGEN: NOT DETECTED
MICRO NUMBER:: 10221739
SPECIMEN QUALITY:: ADEQUATE
TOXIN A AND B: NOT DETECTED

## 2019-10-07 NOTE — Progress Notes (Signed)
[place], [place] [address]   CLINIC:  Medical Oncology/Hematology  PCP:  Rosita Fire, MD [place]  [id number] [phone]   REASON FOR VISIT:  Follow-up for right breast cancer.     CANCER STAGING: Cancer Staging Infiltrating ductal carcinoma of breast ([place]) Staging form: Breast, AJCC 7th Edition - Clinical: Stage IA (T1b, N0, cM0) - Signed by Pieter Partridge, MD on 01/16/2011 - Pathologic: No stage assigned - Unsigned    INTERVAL HISTORY:  [name] 79 y.o. female seen for follow-up of breast cancer, iron deficiency anemia and osteoporosis.  She denies any bleeding per rectum or melena.  Appetite is 50%.  Energy levels are severely low.  She is unable to deal day-to-day activities.  Shortness of breath on exertion from COPD is stable.  Has chronic diarrhea which is also stable.  REVIEW OF SYSTEMS:  Review of Systems  Constitutional: Positive for fatigue.  Respiratory: Positive for shortness of breath.   Gastrointestinal: Positive for diarrhea.  All other systems reviewed and are negative.    PAST MEDICAL/SURGICAL HISTORY:  Past Medical History:  Diagnosis Date  . Breast CA ([place]) 01/16/2011  . Breast cancer ([place])    rt dx 2010; mastectomy only  . Diverticulitis   . Embolism - blood clot    left lung  . History of prolapse of bladder    patient has gellhorn pessary  . Hypertension   . Infiltrating ductal carcinoma of breast ([place]) 01/16/2011   Started Arimidex on 02/24/2009. Stage IB (T1b N0 M0) grade 1 infiltrating ductal carcinoma of the right breast, status post right modified radical mastectomy on 01/22/2009 for a 10 mm cancer, ER +100%, PR +98%, KI-67 marker was 9%, HER-2 negative, no LVI identified, and 11 lymph nodes were negative, all margins were clear. She had some associated DCIS, is now on Arimidex and she will take that for  . Stroke ([place])   . Tendonitis 07/2010   shot of cortisone    Past Surgical History:  Procedure Laterality Date  . ABDOMINAL HYSTERECTOMY  1987  . APPENDECTOMY  1974   blood clot in lung  . BREAST BIOPSY  1968   left  . COLONOSCOPY N/A 01/11/2018   Procedure: COLONOSCOPY;  Surgeon: Danie Binder, MD;  Location: [facility];  Service: Endoscopy;  Laterality: N/A;  10:00  . MASTECTOMY MODIFIED RADICAL  01/22/2009   rt  . NASAL SINUS SURGERY  2002  . POLYPECTOMY  01/11/2018   Procedure: POLYPECTOMY;  Surgeon: Danie Binder, MD;  Location: [facility] END[facility] SUITE;  Service: Endoscopy;;  ascending; descending;  . TEAR DUCT PROBING    . tubal ligation  1972  . YAG LASER APPLICATION Right 7/49/4496   Procedure: YAG LASER APPLICATION;  Surgeon: Williams Che, MD;  Location: [facility] ORS;  Service: Ophthalmology;  Laterality: Right;     SOCIAL HISTORY:  Social History   Socioeconomic History  . Marital status: Divorced    Spouse name: Not on file  . Number of children: Not on file  . Years of education: Not on file  . Highest education level: Not on file  Occupational History  . Not on file  Tobacco Use  . Smoking status: Current Every Day Smoker    Packs/day: 0.50    Years: 62.00    Pack years: 31.00    Types: Cigarettes  . Smokeless tobacco: Never Used  Substance and Sexual Activity  . Alcohol use:  Yes    Comment: occ  . Drug use: No  . Sexual activity: Never    Birth control/protection: Surgical    Comment: hyst  Other Topics Concern  . Not on file  Social History Narrative  . Not on file   Social Determinants of Health   Financial Resource Strain:   . Difficulty of Paying Living Expenses: Not on file  Food Insecurity:   . Worried About Charity fundraiser in the Last Year: Not on file  . Ran Out of Food in the Last Year: Not on file  Transportation Needs:   . Lack of Transportation (Medical): Not on file  . Lack of Transportation (Non-Medical): Not on file  Physical Activity:   . Days of Exercise per Week: Not on file  .  Minutes of Exercise per Session: Not on file  Stress:   . Feeling of Stress : Not on file  Social Connections:   . Frequency of Communication with Friends and Family: Not on file  . Frequency of Social Gatherings with Friends and Family: Not on file  . Attends Religious Services: Not on file  . Active Member of Clubs or Organizations: Not on file  . Attends Archivist Meetings: Not on file  . Marital Status: Not on file  Intimate Partner Violence:   . Fear of Current or Ex-Partner: Not on file  . Emotionally Abused: Not on file  . Physically Abused: Not on file  . Sexually Abused: Not on file    FAMILY HISTORY:  Family History  Problem Relation Age of Onset  . Breast cancer Sister   . Breast cancer Maternal Grandmother   . Colon cancer Daughter 35  . Hypertension Paternal Grandfather   . Stroke Paternal Grandfather   . Other Paternal Grandmother        hardening of arteries  . Other Father        gun shot wound  . Cancer Sister   . Cancer Daughter   . Stroke Son   . Heart attack Son   . Gastric cancer Neg Hx   . Esophageal cancer Neg Hx     CURRENT MEDICATIONS:  Outpatient Encounter Medications as of 10/07/2019  Medication Sig Note  . acetaminophen (TYLENOL) 650 MG CR tablet Take 650 mg by mouth as needed for pain.   Marland Kitchen albuterol (PROVENTIL HFA;VENTOLIN HFA) 108 (90 Base) MCG/ACT inhaler Inhale 1 puff into the lungs every 6 (six) hours as needed for wheezing or shortness of breath.   Marland Kitchen amLODipine (NORVASC) 5 MG tablet Take 5 mg by mouth daily.   Marland Kitchen aspirin 325 MG tablet Take 1 tablet (325 mg total) by mouth daily.   . calcium carbonate (OSCAL) 1500 (600 Ca) MG TABS tablet Take 600 mg of elemental calcium by mouth 2 (two) times daily with a meal.   . diphenoxylate-atropine (LOMOTIL) 2.5-0.025 MG tablet Take 1 tablet by mouth every 6 (six) hours as needed.    . ferrous sulfate 325 (65 FE) MG tablet Take 325 mg by mouth daily with breakfast.   . fluticasone (FLONASE)  50 MCG/ACT nasal spray Place 2 sprays into both nostrils as needed.  02/23/2016: Received from: External Pharmacy  . gabapentin (NEURONTIN) 600 MG tablet Take 600 mg by mouth 3 (three) times daily.    Marland Kitchen lovastatin (MEVACOR) 40 MG tablet Take 40 mg by mouth at bedtime.    . metFORMIN (GLUCOPHAGE) 500 MG tablet Take 500 mg by mouth 2 (two) times daily.  02/23/2016: Received from: External Pharmacy  . metoprolol tartrate (LOPRESSOR) 25 MG tablet Take 25 mg by mouth daily.    . metroNIDAZOLE (METROGEL) 0.75 % vaginal gel Place 1 Applicatorful vaginally once a week. one applicatorful to vagina at bedtime weekly , as needed for vaginal discharge with pessary   . naphazoline-glycerin (CLEAR EYES) 0.012-0.2 % SOLN Place 2 drops into both eyes daily as needed for irritation.   Marland Kitchen omeprazole (PRILOSEC) 20 MG capsule Take 1 capsule by mouth daily.   . SYMBICORT 160-4.5 MCG/ACT inhaler Inhale 2 puffs into the lungs as needed.    . tiotropium (SPIRIVA) 18 MCG inhalation capsule Place 18 mcg into inhaler and inhale daily as needed.   . TRADJENTA 5 MG TABS tablet Take 5 mg by mouth daily.  02/23/2016: Received from: External Pharmacy  . TRUE METRIX BLOOD GLUCOSE TEST test strip     No facility-administered encounter medications on file as of 10/07/2019.    ALLERGIES:  Allergies  Allergen Reactions  . Pollen Extract Other (See Comments)    Runny nose, watery eyes and ears get stopped up  . Red Dye      PHYSICAL EXAM:  ECOG Performance status: 1  Vitals:   10/07/19 1400  BP: (!) 122/47  Pulse: 91  Resp: 16  Temp: (!) 96.9 F (36.1 C)  SpO2: 98%   Filed Weights   10/07/19 1400  Weight: 131 lb 4 oz (59.5 kg)    Physical Exam Vitals reviewed.  Constitutional:      Appearance: Normal appearance.  Cardiovascular:     Rate and Rhythm: Normal rate and regular rhythm.     Heart sounds: Normal heart sounds.  Pulmonary:     Effort: Pulmonary effort is normal.     Breath sounds: Normal breath  sounds.  Abdominal:     General: There is no distension.     Palpations: Abdomen is soft. There is no mass.  Skin:    General: Skin is warm.  Neurological:     General: No focal deficit present.     Mental Status: She is alert and oriented to person, place, and time.  Psychiatric:        Mood and Affect: Mood normal.        Behavior: Behavior normal.      LABORATORY DATA:  I have reviewed the labs as listed.  CBC    Component Value Date/Time   WBC 8.1 09/30/2019 1310   RBC 4.13 09/30/2019 1310   HGB 12.0 09/30/2019 1310   HCT 38.0 09/30/2019 1310   PLT 228 09/30/2019 1310   MCV 92.0 09/30/2019 1310   MCH 29.1 09/30/2019 1310   MCHC 31.6 09/30/2019 1310   RDW 14.3 09/30/2019 1310   LYMPHSABS 1.1 09/30/2019 1310   MONOABS 0.5 09/30/2019 1310   EOSABS 0.1 09/30/2019 1310   BASOSABS 0.0 09/30/2019 1310   CMP Latest Ref Rng & Units 09/04/2019 06/24/2019 04/29/2019  Glucose 70 - 99 mg/dL 195(H) 117(H) 153(H)  BUN 8 - 23 mg/dL 7(L) 11 7(L)  Creatinine 0.44 - 1.00 mg/dL 0.68 0.67 0.64  Sodium 135 - 145 mmol/L 135 135 133(L)  Potassium 3.5 - 5.1 mmol/L 3.9 3.8 4.2  Chloride 98 - 111 mmol/L 98 101 103  CO2 22 - 32 mmol/L 25 26 22   Calcium 8.9 - 10.3 mg/dL 9.3 8.7(L) 8.5(L)  Total Protein 6.5 - 8.1 g/dL 6.9 7.4 7.6  Total Bilirubin 0.3 - 1.2 mg/dL 0.6 0.7 0.4  Alkaline Phos 38 -  126 U/L 57 50 54  AST 15 - 41 U/L 15 15 17   ALT 0 - 44 U/L 11 10 11        DIAGNOSTIC IMAGING:  I have independently reviewed the scans.   I have reviewed Venita Lick LPN's note and agree with the documentation.  I personally performed a face-to-face visit, made revisions and my assessment and plan is as follows.    ASSESSMENT & PLAN:   IDA (iron deficiency anemia) 1.  Iron deficiency anemia: -Last Feraheme infusion was on 05/17/2019 and 05/14/2019. -She has taken iron pills in the past which did not help.  She has difficulty with absorption. -She complains of feeling very tired and  unable to do household activities. -We reviewed labs from 09/30/2019 which showed ferritin dropped to 29 from 89.  Percent saturation is 17.  B12 is 289.  Vitamin D is 36.97.  Hemoglobin decreased to 12 from 12.9 g. -Because of her severe tiredness, and downtrending ferritin, I have recommended Feraheme weekly x2. -We will reevaluate her in 3 months with repeat labs.  2.  Osteoporosis: -DEXA scan on 06/03/2018 shows T score of -2.5. -She started on Prolia every 6 months on 03/10/2019. -She is continuing calcium and vitamin D supplements.  We reviewed results of vitamin D which was normal at 36.97.  3.  Vitamin B12 deficiency: -She will continue B12 1 mg tablet daily for borderline low levels. -Today her level is 289.  4.  Stage I (T1BN0) grade 1 IDC of the right breast: -Status post modified radical mastectomy of the right side on 01/22/2009, 5 years of anastrozole completed in 2015 -Last mammogram on 02/05/2019 was BI-RADS Category 1.   Orders placed this encounter:  Orders Placed This Encounter  Procedures  . CBC with Differential/Platelet  . Comprehensive metabolic panel  . Vitamin D 25 hydroxy  . Vitamin B12  . Ferritin  . Iron and TIBC      Derek Jack, MD [place] [phone]

## 2019-10-07 NOTE — Patient Instructions (Addendum)
[place] at [facility] Discharge Instructions  You were seen today by Dr. Delton Coombes. He went over your recent lab results. He wants you to restart Vitamin B12 over the counter.  He will arrange for you to have iron infusions.  He will see you back in 3 months for labs and follow up.   Thank you for choosing [place] at [facility] to provide your oncology and hematology care.  To afford each patient quality time with our provider, please arrive at least 15 minutes before your scheduled appointment time.   If you have a lab appointment with the [place] [facility] please come in thru the  Main Entrance and check in at the main information desk  You need to re-schedule your appointment should you arrive 10 or more minutes late.  We strive to give you quality time with our providers, and arriving late affects you and other patients whose appointments are after yours.  Also, if you no show three or more times for appointments you may be dismissed from the clinic at the providers discretion.     Again, thank you for choosing [facility].  Our hope is that these requests will decrease the amount of time that you wait before being seen by our physicians.       _____________________________________________________________  Should you have questions after your visit to [facility], please contact our office at [phone] between the hours of 8:00 a.m. and 4:30 p.m.  Voicemails left after 4:00 p.m. will not be returned until the following business day.  For prescription refill requests, have your pharmacy contact our office and allow 72 hours.    Cancer Center Support Programs:   > Cancer Support Group  2nd Tuesday of the month 1pm-2pm, Journey Room

## 2019-10-07 NOTE — Assessment & Plan Note (Signed)
1.  Iron deficiency anemia: -Last Feraheme infusion was on 05/17/2019 and 05/14/2019. -She has taken iron pills in the past which did not help.  She has difficulty with absorption. -She complains of feeling very tired and unable to do household activities. -We reviewed labs from 09/30/2019 which showed ferritin dropped to 29 from 89.  Percent saturation is 17.  B12 is 289.  Vitamin D is 36.97.  Hemoglobin decreased to 12 from 12.9 g. -Because of her severe tiredness, and downtrending ferritin, I have recommended Feraheme weekly x2. -We will reevaluate her in 3 months with repeat labs.  2.  Osteoporosis: -DEXA scan on 06/03/2018 shows T score of -2.5. -She started on Prolia every 6 months on 03/10/2019. -She is continuing calcium and vitamin D supplements.  We reviewed results of vitamin D which was normal at 36.97.  3.  Vitamin B12 deficiency: -She will continue B12 1 mg tablet daily for borderline low levels. -Today her level is 289.  4.  Stage I (T1BN0) grade 1 IDC of the right breast: -Status post modified radical mastectomy of the right side on 01/22/2009, 5 years of anastrozole completed in 2015 -Last mammogram on 02/05/2019 was BI-RADS Category 1.

## 2019-10-11 DIAGNOSIS — I10 Essential (primary) hypertension: Secondary | ICD-10-CM | POA: Diagnosis not present

## 2019-10-11 DIAGNOSIS — K219 Gastro-esophageal reflux disease without esophagitis: Secondary | ICD-10-CM | POA: Diagnosis not present

## 2019-10-14 ENCOUNTER — Other Ambulatory Visit: Payer: Self-pay

## 2019-10-14 ENCOUNTER — Encounter (HOSPITAL_COMMUNITY): Payer: Self-pay

## 2019-10-14 ENCOUNTER — Inpatient Hospital Stay (HOSPITAL_COMMUNITY): Payer: Medicare HMO

## 2019-10-14 VITALS — BP 121/59 | HR 79 | Temp 96.9°F | Resp 18

## 2019-10-14 DIAGNOSIS — Z9011 Acquired absence of right breast and nipple: Secondary | ICD-10-CM | POA: Diagnosis not present

## 2019-10-14 DIAGNOSIS — E538 Deficiency of other specified B group vitamins: Secondary | ICD-10-CM | POA: Diagnosis not present

## 2019-10-14 DIAGNOSIS — M810 Age-related osteoporosis without current pathological fracture: Secondary | ICD-10-CM | POA: Diagnosis not present

## 2019-10-14 DIAGNOSIS — D509 Iron deficiency anemia, unspecified: Secondary | ICD-10-CM | POA: Diagnosis not present

## 2019-10-14 DIAGNOSIS — R197 Diarrhea, unspecified: Secondary | ICD-10-CM | POA: Diagnosis not present

## 2019-10-14 DIAGNOSIS — F17210 Nicotine dependence, cigarettes, uncomplicated: Secondary | ICD-10-CM | POA: Diagnosis not present

## 2019-10-14 DIAGNOSIS — Z853 Personal history of malignant neoplasm of breast: Secondary | ICD-10-CM | POA: Diagnosis not present

## 2019-10-14 DIAGNOSIS — J449 Chronic obstructive pulmonary disease, unspecified: Secondary | ICD-10-CM | POA: Diagnosis not present

## 2019-10-14 DIAGNOSIS — Z86711 Personal history of pulmonary embolism: Secondary | ICD-10-CM | POA: Diagnosis not present

## 2019-10-14 MED ORDER — SODIUM CHLORIDE 0.9 % IV SOLN: Freq: Once | INTRAVENOUS | Status: AC

## 2019-10-14 MED ORDER — SODIUM CHLORIDE 0.9 % IV SOLN
510.0000 mg | Freq: Once | INTRAVENOUS | Status: AC
  Administered 2019-10-14: 510 mg via INTRAVENOUS
  Filled 2019-10-14: qty 510

## 2019-10-14 NOTE — Progress Notes (Signed)
Patient tolerated iron infusion with no complaints voiced.  Peripheral IV site clean and dry with good blood return noted before and after infusion.  Band aid applied.  VSS with discharge and left ambulatory with no s/s of distress noted.  

## 2019-10-21 ENCOUNTER — Encounter (HOSPITAL_COMMUNITY): Payer: Self-pay

## 2019-10-21 ENCOUNTER — Other Ambulatory Visit: Payer: Self-pay

## 2019-10-21 ENCOUNTER — Inpatient Hospital Stay (HOSPITAL_COMMUNITY): Payer: Medicare HMO

## 2019-10-21 VITALS — BP 136/57 | HR 80 | Temp 97.5°F | Resp 18

## 2019-10-21 DIAGNOSIS — Z86711 Personal history of pulmonary embolism: Secondary | ICD-10-CM | POA: Diagnosis not present

## 2019-10-21 DIAGNOSIS — Z9011 Acquired absence of right breast and nipple: Secondary | ICD-10-CM | POA: Diagnosis not present

## 2019-10-21 DIAGNOSIS — R197 Diarrhea, unspecified: Secondary | ICD-10-CM | POA: Diagnosis not present

## 2019-10-21 DIAGNOSIS — E538 Deficiency of other specified B group vitamins: Secondary | ICD-10-CM | POA: Diagnosis not present

## 2019-10-21 DIAGNOSIS — Z853 Personal history of malignant neoplasm of breast: Secondary | ICD-10-CM | POA: Diagnosis not present

## 2019-10-21 DIAGNOSIS — J449 Chronic obstructive pulmonary disease, unspecified: Secondary | ICD-10-CM | POA: Diagnosis not present

## 2019-10-21 DIAGNOSIS — D509 Iron deficiency anemia, unspecified: Secondary | ICD-10-CM | POA: Diagnosis not present

## 2019-10-21 DIAGNOSIS — M810 Age-related osteoporosis without current pathological fracture: Secondary | ICD-10-CM | POA: Diagnosis not present

## 2019-10-21 DIAGNOSIS — F17210 Nicotine dependence, cigarettes, uncomplicated: Secondary | ICD-10-CM | POA: Diagnosis not present

## 2019-10-21 MED ORDER — SODIUM CHLORIDE 0.9 % IV SOLN
510.0000 mg | Freq: Once | INTRAVENOUS | Status: AC
  Administered 2019-10-21: 510 mg via INTRAVENOUS
  Filled 2019-10-21: qty 510

## 2019-10-21 MED ORDER — SODIUM CHLORIDE 0.9 % IV SOLN: Freq: Once | INTRAVENOUS | Status: AC

## 2019-10-21 NOTE — Progress Notes (Signed)
Iron given per orders. Patient tolerated it well without problems. Vitals stable and discharged home from clinic ambulatory. Follow up as scheduled.  

## 2019-10-21 NOTE — Patient Instructions (Signed)
[facility] at [facility]  Discharge Instructions:   _______________________________________________________________  Thank you for choosing [facility] at [facility] to provide your oncology and hematology care.  To afford each patient quality time with our providers, please arrive at least 15 minutes before your scheduled appointment.  You need to re-schedule your appointment if you arrive 10 or more minutes late.  We strive to give you quality time with our providers, and arriving late affects you and other patients whose appointments are after yours.  Also, if you no show three or more times for appointments you may be dismissed from the clinic.  Again, thank you for choosing [facility] at [facility]. Our hope is that these requests will allow you access to exceptional care and in a timely manner. _______________________________________________________________  If you have questions after your visit, please contact our office at [phone] between the hours of 8:30 a.m. and 5:00 p.m. Voicemails left after 4:30 p.m. will not be returned until the following business day. _______________________________________________________________  For prescription refill requests, have your pharmacy contact our office. _______________________________________________________________  Recommendations made by the consultant and any test results will be sent to your referring physician. _______________________________________________________________ 

## 2019-10-23 NOTE — Progress Notes (Signed)
Called pt and left vm about labs

## 2019-11-11 DIAGNOSIS — K219 Gastro-esophageal reflux disease without esophagitis: Secondary | ICD-10-CM | POA: Diagnosis not present

## 2019-11-11 DIAGNOSIS — M545 Low back pain: Secondary | ICD-10-CM | POA: Diagnosis not present

## 2019-12-03 ENCOUNTER — Ambulatory Visit: Payer: Medicare HMO | Admitting: Nurse Practitioner

## 2019-12-04 ENCOUNTER — Other Ambulatory Visit: Payer: Self-pay

## 2019-12-04 ENCOUNTER — Ambulatory Visit (INDEPENDENT_AMBULATORY_CARE_PROVIDER_SITE_OTHER): Payer: Medicare HMO | Admitting: Orthopedic Surgery

## 2019-12-04 VITALS — BP 145/83 | HR 90 | Ht 65.0 in | Wt 130.0 lb

## 2019-12-04 DIAGNOSIS — F17200 Nicotine dependence, unspecified, uncomplicated: Secondary | ICD-10-CM | POA: Diagnosis not present

## 2019-12-04 DIAGNOSIS — K219 Gastro-esophageal reflux disease without esophagitis: Secondary | ICD-10-CM | POA: Diagnosis not present

## 2019-12-04 DIAGNOSIS — M7542 Impingement syndrome of left shoulder: Secondary | ICD-10-CM

## 2019-12-04 DIAGNOSIS — J449 Chronic obstructive pulmonary disease, unspecified: Secondary | ICD-10-CM | POA: Diagnosis not present

## 2019-12-04 DIAGNOSIS — I10 Essential (primary) hypertension: Secondary | ICD-10-CM | POA: Diagnosis not present

## 2019-12-04 NOTE — Progress Notes (Signed)
Progress Note   Patient ID: [name], female   DOB: 09-Mar-1941, 79 y.o.   MRN: [id number]  Body mass index is 21.63 kg/m.  Chief Complaint  Patient presents with  . Shoulder Pain    left shoulder upper arm     Encounter Diagnosis  Name Primary?  . Impingement syndrome of left shoulder Yes    79 year old female previously treated for right periscapular pain and trigger point injections comes in with left shoulder pain decreased range of motion and pain with forward elevation complaining more of pain in the deltoid and upper arm    ROS  Right parascapular pain pain does not go below the elbow of either arm    BP (!) 145/83   Pulse 90   Ht 5\' 5"  (1.651 m)   Wt 130 lb (59 kg)   BMI 21.63 kg/m   Physical Exam Constitutional:      General: She is not in acute distress.    Appearance: She is well-developed.  Cardiovascular:     Comments: No peripheral edema Musculoskeletal:     Comments: Left shoulder active range of motion abduction 90, flexion 90, extension 40  Passive range of motion pain at 110 degrees of flexion positive impingement sign  Skin:    General: Skin is warm and dry.  Neurological:     Mental Status: She is alert and oriented to person, place, and time.     Sensory: No sensory deficit.     Coordination: Coordination normal.     Gait: Gait abnormal.     Deep Tendon Reflexes: Reflexes are normal and symmetric.      MEDICAL DECISION MAKING  A.  Encounter Diagnosis  Name Primary?  . Impingement syndrome of left shoulder Yes    C. MANAGEMENT  Recommend subacromial injection left shoulder  Procedure note the subacromial injection shoulder left   Verbal consent was obtained to inject the  Left   Shoulder  Timeout was completed to confirm the injection site is a subacromial space of the  left  shoulder  Medication used Depo-Medrol 40 mg and lidocaine 1% 3 cc  Anesthesia was provided by ethyl chloride  The injection was performed in the  left  posterior subacromial space. After pinning the skin with alcohol and anesthetized the skin with ethyl chloride the subacromial space was injected using a 20-gauge needle. There were no complications  Sterile dressing was applied.  Encounter Diagnosis  Name Primary?  . Impingement syndrome of left shoulder Yes    Acute uncomplicated, no additional data, injection  No orders of the defined types were placed in this encounter.   Arther Abbott, MD 12/04/2019 3:04 PM

## 2019-12-04 NOTE — Patient Instructions (Signed)

## 2019-12-12 ENCOUNTER — Emergency Department (HOSPITAL_COMMUNITY): Payer: Medicare HMO

## 2019-12-12 ENCOUNTER — Encounter (HOSPITAL_COMMUNITY): Payer: Self-pay | Admitting: Emergency Medicine

## 2019-12-12 ENCOUNTER — Other Ambulatory Visit: Payer: Self-pay

## 2019-12-12 ENCOUNTER — Inpatient Hospital Stay (HOSPITAL_COMMUNITY)
Admission: EM | Admit: 2019-12-12 | Discharge: 2019-12-19 | DRG: 872 | Disposition: A | Discharge status: Skilled Nursing Facility | Payer: Medicare HMO | Attending: Internal Medicine | Admitting: Internal Medicine

## 2019-12-12 DIAGNOSIS — E1140 Type 2 diabetes mellitus with diabetic neuropathy, unspecified: Secondary | ICD-10-CM | POA: Diagnosis present

## 2019-12-12 DIAGNOSIS — Z800 Family history of malignant neoplasm of digestive organs: Secondary | ICD-10-CM | POA: Diagnosis not present

## 2019-12-12 DIAGNOSIS — E1142 Type 2 diabetes mellitus with diabetic polyneuropathy: Secondary | ICD-10-CM | POA: Diagnosis not present

## 2019-12-12 DIAGNOSIS — R509 Fever, unspecified: Secondary | ICD-10-CM | POA: Diagnosis not present

## 2019-12-12 DIAGNOSIS — K5720 Diverticulitis of large intestine with perforation and abscess without bleeding: Secondary | ICD-10-CM | POA: Diagnosis not present

## 2019-12-12 DIAGNOSIS — K5732 Diverticulitis of large intestine without perforation or abscess without bleeding: Secondary | ICD-10-CM | POA: Diagnosis not present

## 2019-12-12 DIAGNOSIS — R197 Diarrhea, unspecified: Secondary | ICD-10-CM | POA: Diagnosis not present

## 2019-12-12 DIAGNOSIS — E1169 Type 2 diabetes mellitus with other specified complication: Secondary | ICD-10-CM | POA: Diagnosis present

## 2019-12-12 DIAGNOSIS — I10 Essential (primary) hypertension: Secondary | ICD-10-CM | POA: Diagnosis present

## 2019-12-12 DIAGNOSIS — J449 Chronic obstructive pulmonary disease, unspecified: Secondary | ICD-10-CM | POA: Diagnosis present

## 2019-12-12 DIAGNOSIS — R5383 Other fatigue: Secondary | ICD-10-CM | POA: Diagnosis not present

## 2019-12-12 DIAGNOSIS — A419 Sepsis, unspecified organism: Principal | ICD-10-CM | POA: Diagnosis present

## 2019-12-12 DIAGNOSIS — M109 Gout, unspecified: Secondary | ICD-10-CM | POA: Diagnosis not present

## 2019-12-12 DIAGNOSIS — K219 Gastro-esophageal reflux disease without esophagitis: Secondary | ICD-10-CM | POA: Diagnosis present

## 2019-12-12 DIAGNOSIS — Z803 Family history of malignant neoplasm of breast: Secondary | ICD-10-CM

## 2019-12-12 DIAGNOSIS — E785 Hyperlipidemia, unspecified: Secondary | ICD-10-CM | POA: Diagnosis not present

## 2019-12-12 DIAGNOSIS — Z823 Family history of stroke: Secondary | ICD-10-CM

## 2019-12-12 DIAGNOSIS — Z7401 Bed confinement status: Secondary | ICD-10-CM | POA: Diagnosis not present

## 2019-12-12 DIAGNOSIS — R410 Disorientation, unspecified: Secondary | ICD-10-CM | POA: Diagnosis not present

## 2019-12-12 DIAGNOSIS — Z8249 Family history of ischemic heart disease and other diseases of the circulatory system: Secondary | ICD-10-CM | POA: Diagnosis not present

## 2019-12-12 DIAGNOSIS — D649 Anemia, unspecified: Secondary | ICD-10-CM | POA: Diagnosis not present

## 2019-12-12 DIAGNOSIS — F419 Anxiety disorder, unspecified: Secondary | ICD-10-CM | POA: Diagnosis present

## 2019-12-12 DIAGNOSIS — F17210 Nicotine dependence, cigarettes, uncomplicated: Secondary | ICD-10-CM | POA: Diagnosis not present

## 2019-12-12 DIAGNOSIS — Z853 Personal history of malignant neoplasm of breast: Secondary | ICD-10-CM | POA: Diagnosis not present

## 2019-12-12 DIAGNOSIS — R000 Tachycardia, unspecified: Secondary | ICD-10-CM | POA: Diagnosis not present

## 2019-12-12 DIAGNOSIS — R29898 Other symptoms and signs involving the musculoskeletal system: Secondary | ICD-10-CM | POA: Diagnosis not present

## 2019-12-12 DIAGNOSIS — Z8673 Personal history of transient ischemic attack (TIA), and cerebral infarction without residual deficits: Secondary | ICD-10-CM | POA: Diagnosis not present

## 2019-12-12 DIAGNOSIS — R109 Unspecified abdominal pain: Secondary | ICD-10-CM | POA: Diagnosis not present

## 2019-12-12 DIAGNOSIS — R1084 Generalized abdominal pain: Secondary | ICD-10-CM | POA: Diagnosis not present

## 2019-12-12 DIAGNOSIS — Z20822 Contact with and (suspected) exposure to covid-19: Secondary | ICD-10-CM | POA: Diagnosis not present

## 2019-12-12 DIAGNOSIS — Z720 Tobacco use: Secondary | ICD-10-CM | POA: Diagnosis not present

## 2019-12-12 DIAGNOSIS — R52 Pain, unspecified: Secondary | ICD-10-CM | POA: Diagnosis not present

## 2019-12-12 DIAGNOSIS — M8580 Other specified disorders of bone density and structure, unspecified site: Secondary | ICD-10-CM | POA: Diagnosis present

## 2019-12-12 DIAGNOSIS — M6281 Muscle weakness (generalized): Secondary | ICD-10-CM | POA: Diagnosis not present

## 2019-12-12 DIAGNOSIS — F10931 Alcohol use, unspecified with withdrawal delirium: Secondary | ICD-10-CM

## 2019-12-12 DIAGNOSIS — J849 Interstitial pulmonary disease, unspecified: Secondary | ICD-10-CM | POA: Diagnosis present

## 2019-12-12 LAB — COMPREHENSIVE METABOLIC PANEL
ALT: 13 U/L (ref 0–44)
AST: 17 U/L (ref 15–41)
Albumin: 4.2 g/dL (ref 3.5–5.0)
Alkaline Phosphatase: 55 U/L (ref 38–126)
Anion gap: 11 (ref 5–15)
BUN: 8 mg/dL (ref 8–23)
CO2: 27 mmol/L (ref 22–32)
Calcium: 8.9 mg/dL (ref 8.9–10.3)
Chloride: 96 mmol/L — ABNORMAL LOW (ref 98–111)
Creatinine, Ser: 0.64 mg/dL (ref 0.44–1.00)
GFR calc Af Amer: >60 mL/min (ref >60)
GFR calc non Af Amer: >60 mL/min (ref >60)
Glucose, Bld: 136 mg/dL — ABNORMAL HIGH (ref 70–99)
Potassium: 4 mmol/L (ref 3.5–5.1)
Sodium: 134 mmol/L — ABNORMAL LOW (ref 135–145)
Total Bilirubin: 0.8 mg/dL (ref 0.3–1.2)
Total Protein: 8.1 g/dL (ref 6.5–8.1)

## 2019-12-12 LAB — CBC
HCT: 46.2 % — ABNORMAL HIGH (ref 36.0–46.0)
Hemoglobin: 14.7 g/dL (ref 12.0–15.0)
MCH: 29.7 pg (ref 26.0–34.0)
MCHC: 31.8 g/dL (ref 30.0–36.0)
MCV: 93.3 fL (ref 80.0–100.0)
Platelets: 215 10*3/uL (ref 150–400)
RBC: 4.95 MIL/uL (ref 3.87–5.11)
RDW: 16.4 % — ABNORMAL HIGH (ref 11.5–15.5)
WBC: 18.7 10*3/uL — ABNORMAL HIGH (ref 4.0–10.5)
nRBC: 0 % (ref 0.0–0.2)

## 2019-12-12 LAB — GLUCOSE, CAPILLARY
Glucose-Capillary: 122 mg/dL — ABNORMAL HIGH (ref 70–99)
Glucose-Capillary: 118 mg/dL — ABNORMAL HIGH (ref 70–99)
Glucose-Capillary: 131 mg/dL — ABNORMAL HIGH (ref 70–99)
Glucose-Capillary: 133 mg/dL — ABNORMAL HIGH (ref 70–99)

## 2019-12-12 LAB — TROPONIN I (HIGH SENSITIVITY)
Troponin I (High Sensitivity): 3 ng/L (ref <18)
Troponin I (High Sensitivity): 5 ng/L (ref <18)

## 2019-12-12 LAB — TSH: TSH: 1.226 u[IU]/mL (ref 0.350–4.500)

## 2019-12-12 LAB — LACTIC ACID, PLASMA
Lactic Acid, Venous: 2.4 mmol/L (ref 0.5–1.9)
Lactic Acid, Venous: 2 mmol/L (ref 0.5–1.9)

## 2019-12-12 LAB — HEMOGLOBIN A1C
Hgb A1c MFr Bld: 5.9 % — ABNORMAL HIGH (ref 4.8–5.6)
Mean Plasma Glucose: 122.63 mg/dL

## 2019-12-12 LAB — SARS CORONAVIRUS 2 BY RT PCR (HOSPITAL ORDER, PERFORMED IN ~~LOC~~ HOSPITAL LAB): SARS Coronavirus 2: NEGATIVE

## 2019-12-12 LAB — LIPASE, BLOOD: Lipase: 46 U/L (ref 11–51)

## 2019-12-12 LAB — MAGNESIUM: Magnesium: 1.7 mg/dL (ref 1.7–2.4)

## 2019-12-12 MED ORDER — SODIUM CHLORIDE 0.9 % IV SOLN: Freq: Once | INTRAVENOUS | Status: AC

## 2019-12-12 MED ORDER — IOHEXOL 300 MG/ML  SOLN
100.0000 mL | Freq: Once | INTRAMUSCULAR | Status: AC | PRN
  Administered 2019-12-12: 100 mL via INTRAVENOUS

## 2019-12-12 MED ORDER — ACETAMINOPHEN 500 MG PO TABS
1000.0000 mg | ORAL_TABLET | Freq: Once | ORAL | Status: AC
  Administered 2019-12-12: 1000 mg via ORAL
  Filled 2019-12-12: qty 2

## 2019-12-12 MED ORDER — ASPIRIN 325 MG PO TABS
325.0000 mg | ORAL_TABLET | Freq: Every day | ORAL | Status: DC
  Administered 2019-12-13 – 2019-12-19 (×7): 325 mg via ORAL
  Filled 2019-12-12 (×7): qty 1

## 2019-12-12 MED ORDER — INSULIN ASPART 100 UNIT/ML ~~LOC~~ SOLN
0.0000 [IU] | Freq: Three times a day (TID) | SUBCUTANEOUS | Status: DC
  Administered 2019-12-13 – 2019-12-19 (×13): 1 [IU] via SUBCUTANEOUS

## 2019-12-12 MED ORDER — PANTOPRAZOLE SODIUM 40 MG PO TBEC
40.0000 mg | DELAYED_RELEASE_TABLET | Freq: Every day | ORAL | Status: DC
  Administered 2019-12-13 – 2019-12-19 (×7): 40 mg via ORAL
  Filled 2019-12-12 (×7): qty 1

## 2019-12-12 MED ORDER — ONDANSETRON HCL 4 MG/2ML IJ SOLN
4.0000 mg | Freq: Once | INTRAMUSCULAR | Status: AC
  Administered 2019-12-12: 4 mg via INTRAVENOUS
  Filled 2019-12-12: qty 2

## 2019-12-12 MED ORDER — MORPHINE SULFATE (PF) 4 MG/ML IV SOLN: 4.0000 mg | Freq: Once | INTRAVENOUS | Status: DC

## 2019-12-12 MED ORDER — MOMETASONE FURO-FORMOTEROL FUM 200-5 MCG/ACT IN AERO
2.0000 | INHALATION_SPRAY | Freq: Two times a day (BID) | RESPIRATORY_TRACT | Status: DC
  Administered 2019-12-12 – 2019-12-19 (×12): 2 via RESPIRATORY_TRACT
  Filled 2019-12-12 (×2): qty 8.8

## 2019-12-12 MED ORDER — SODIUM CHLORIDE 0.9 % IV BOLUS: 500.0000 mL | Freq: Once | INTRAVENOUS | Status: DC

## 2019-12-12 MED ORDER — HEPARIN SODIUM (PORCINE) 5000 UNIT/ML IJ SOLN
5000.0000 [IU] | Freq: Three times a day (TID) | INTRAMUSCULAR | Status: DC
  Administered 2019-12-12 – 2019-12-18 (×18): 5000 [IU] via SUBCUTANEOUS
  Filled 2019-12-12 (×20): qty 1

## 2019-12-12 MED ORDER — PRAVASTATIN SODIUM 10 MG PO TABS
10.0000 mg | ORAL_TABLET | Freq: Every day | ORAL | Status: DC
  Administered 2019-12-12 – 2019-12-18 (×7): 10 mg via ORAL
  Filled 2019-12-12 (×7): qty 1

## 2019-12-12 MED ORDER — PIPERACILLIN-TAZOBACTAM 3.375 G IVPB 30 MIN
3.3750 g | Freq: Once | INTRAVENOUS | Status: AC
  Administered 2019-12-12: 3.375 g via INTRAVENOUS
  Filled 2019-12-12: qty 50

## 2019-12-12 MED ORDER — HYDRALAZINE HCL 20 MG/ML IJ SOLN: 5.0000 mg | INTRAMUSCULAR | Status: DC | PRN

## 2019-12-12 MED ORDER — OXYCODONE HCL 5 MG PO TABS
5.0000 mg | ORAL_TABLET | ORAL | Status: DC | PRN
  Administered 2019-12-13 – 2019-12-19 (×7): 5 mg via ORAL
  Filled 2019-12-12 (×7): qty 1

## 2019-12-12 MED ORDER — PIPERACILLIN-TAZOBACTAM 3.375 G IVPB
3.3750 g | Freq: Three times a day (TID) | INTRAVENOUS | Status: DC
  Administered 2019-12-12 – 2019-12-16 (×10): 3.375 g via INTRAVENOUS
  Filled 2019-12-12 (×10): qty 50

## 2019-12-12 MED ORDER — ACETAMINOPHEN 325 MG PO TABS
650.0000 mg | ORAL_TABLET | Freq: Four times a day (QID) | ORAL | Status: DC | PRN
  Administered 2019-12-12 – 2019-12-18 (×3): 650 mg via ORAL
  Filled 2019-12-12 (×3): qty 2

## 2019-12-12 MED ORDER — SODIUM CHLORIDE 0.9 % IV SOLN: INTRAVENOUS | Status: DC

## 2019-12-12 MED ORDER — INSULIN ASPART 100 UNIT/ML ~~LOC~~ SOLN: 0.0000 [IU] | Freq: Every day | SUBCUTANEOUS | Status: DC

## 2019-12-12 NOTE — ED Notes (Signed)
Lab at bedside for cultures

## 2019-12-12 NOTE — Progress Notes (Signed)
Pharmacy Antibiotic Note  [name] is a 79 y.o. female admitted on 12/12/2019 with unknown infection.  Pharmacy has been consulted for zosyn dosing.  Plan: Zosyn 3.375g IV q8h (4 hour infusion).  Height: 5\' 5"  (165.1 cm) Weight: 59 kg (130 lb) IBW/kg (Calculated) : 57  Temp (24hrs), Avg:99.4 F (37.4 C), Min:97.7 F (36.5 C), Max:101 F (38.3 C)  Recent Labs  Lab 12/12/19 1249  WBC 18.7*  CREATININE 0.64  LATICACIDVEN 2.4*    Estimated Creatinine Clearance: 50.5 mL/min (by C-G formula based on SCr of 0.64 mg/dL).    Allergies  Allergen Reactions  . Pollen Extract Other (See Comments)    Runny nose, watery eyes and ears get stopped up  . Red Dye     Antimicrobials this admission: 5/14 zosyn >>   Microbiology results: 5/14 BCx: sent 5/14 Covid: negative  Thank you for allowing pharmacy to be a part of this patient's care.  Donna Christen Alita Waldren 12/12/2019 3:58 PM

## 2019-12-12 NOTE — ED Notes (Addendum)
Daughter at bedside   Awaiting physician update  Dr Lowell Guitar, hospitalist states he has spoken with pt and daughter

## 2019-12-12 NOTE — ED Provider Notes (Signed)
Emergency Department Provider Note   I have reviewed the triage vital signs and the nursing notes.   HISTORY  Chief Complaint Diarrhea   HPI [name] is a 79 y.o. female with past medical history reviewed below presents to the emergency department with abdominal pain starting this morning with multiple episodes of nonbloody diarrhea.  She was not aware of fever but was noted to have a fever here.  She describes feeling fatigue.  She denies any upper respiratory symptoms, cough, shortness of breath.  She does have history of diverticulitis.  She is not experiencing heart palpitations or CP.  No associated vomiting.  No back pain.  No urinary tract infection symptoms.   Past Medical History:  Diagnosis Date  . Breast CA ([place]) 01/16/2011  . Breast cancer ([place])    rt dx 2010; mastectomy only  . Diverticulitis   . Embolism - blood clot    left lung  . History of prolapse of bladder    patient has gellhorn pessary  . Hypertension   . Infiltrating ductal carcinoma of breast ([place]) 01/16/2011   Started Arimidex on 02/24/2009. Stage IB (T1b N0 M0) grade 1 infiltrating ductal carcinoma of the right breast, status post right modified radical mastectomy on 01/22/2009 for a 10 mm cancer, ER +100%, PR +98%, KI-67 marker was 9%, HER-2 negative, no LVI identified, and 11 lymph nodes were negative, all margins were clear. She had some associated DCIS, is now on Arimidex and she will take that for  . Stroke ([place])   . Tendonitis 07/2010   shot of cortisone    Patient Active Problem List   Diagnosis Date Noted  . Sepsis ([place]) 12/12/2019  . Diverticulitis of large intestine with perforation without bleeding   . Diarrhea 10/01/2019  . IDA (iron deficiency anemia) 03/27/2019  . Hemorrhoids 03/27/2019  . Rectal bleeding 03/27/2019  . Osteoporosis 02/26/2019  . Special screening for malignant neoplasms, colon   . Pelvic relaxation due to enterocele, vaginal 10/31/2017  . Stroke (cerebrum)  ([place]) 03/22/2016  . Tobacco use disorder 03/22/2016  . Type 2 diabetes mellitus with hyperlipidemia ([place]) 03/22/2016  . Benign essential HTN 03/22/2016  . Hyperlipidemia 03/22/2016  . Acute left-sided weakness 03/22/2016  . Osteopenia 01/25/2014  . Interstitial lung disease ([place]) 09/13/2011  . Embolism - blood clot   . Infiltrating ductal carcinoma of breast ([place]) 01/16/2011  . Tendonitis   . DEQUERVAIN'S 08/10/2010    Past Surgical History:  Procedure Laterality Date  . ABDOMINAL HYSTERECTOMY  1987  . APPENDECTOMY  1974   blood clot in lung  . BREAST BIOPSY  1968   left  . COLONOSCOPY N/A 01/11/2018   Procedure: COLONOSCOPY;  Surgeon: Danie Binder, MD;  Location: [facility];  Service: Endoscopy;  Laterality: N/A;  10:00  . MASTECTOMY MODIFIED RADICAL  01/22/2009   rt  . NASAL SINUS SURGERY  2002  . POLYPECTOMY  01/11/2018   Procedure: POLYPECTOMY;  Surgeon: Danie Binder, MD;  Location: [facility];  Service: Endoscopy;;  ascending; descending;  . TEAR DUCT PROBING    . tubal ligation  1972  . YAG LASER APPLICATION Right 1/68/3729   Procedure: YAG LASER APPLICATION;  Surgeon: Williams Che, MD;  Location: [facility] ORS;  Service: Ophthalmology;  Laterality: Right;    Allergies Pollen extract and Red dye  Family History  Problem Relation Age of Onset  . Breast cancer Sister   . Breast cancer Maternal Grandmother   .  Colon cancer Daughter 62  . Hypertension Paternal Grandfather   . Stroke Paternal Grandfather   . Other Paternal Grandmother        hardening of arteries  . Other Father        gun shot wound  . Cancer Sister   . Cancer Daughter   . Stroke Son   . Heart attack Son   . Gastric cancer Neg Hx   . Esophageal cancer Neg Hx     Social History Social History   Tobacco Use  . Smoking status: Current Every Day Smoker    Packs/day: 0.50    Years: 62.00    Pack years: 31.00    Types: Cigarettes  . Smokeless tobacco: Never Used  Substance Use  Topics  . Alcohol use: Yes    Comment: occ  . Drug use: No    Review of Systems  Constitutional: No fever/chills (fever noted here).  Eyes: No visual changes. ENT: No sore throat. Cardiovascular: Denies chest pain. Respiratory: Denies shortness of breath. Gastrointestinal: Positive abdominal pain.  Positive nausea, no vomiting. Positive diarrhea.  No constipation. Genitourinary: Negative for dysuria. Musculoskeletal: Negative for back pain. Skin: Negative for rash. Neurological: Negative for headaches, focal weakness or numbness.  10-point ROS otherwise negative.  ____________________________________________   PHYSICAL EXAM:  VITAL SIGNS: ED Triage Vitals  Enc Vitals Group     BP 12/12/19 1238 (!) 144/97     Pulse Rate 12/12/19 1238 (!) 109     Resp 12/12/19 1238 20     Temp 12/12/19 1238 (!) 101 F (38.3 C)     Temp Source 12/12/19 1238 Oral     SpO2 12/12/19 1238 96 %     Weight 12/12/19 1231 130 lb (59 kg)     Height 12/12/19 1231 _0  (1.651 m)   Constitutional: Alert and oriented. Well appearing and in no acute distress. Eyes: Conjunctivae are normal. Head: Atraumatic. Nose: No congestion/rhinnorhea. Mouth/Throat: Mucous membranes are moist. Neck: No stridor.   Cardiovascular: Normal rate, regular rhythm. Good peripheral circulation. Grossly normal heart sounds.   Respiratory: Normal respiratory effort.  No retractions. Lungs CTAB. Gastrointestinal: Soft with focal suprapubic and LLQ abdominal tenderness. No distention.  Musculoskeletal: No lower extremity tenderness nor edema. No gross deformities of extremities. Neurologic:  Normal speech and language. No gross focal neurologic deficits are appreciated.  Skin:  Skin is warm, dry and intact. No rash noted.  ____________________________________________   LABS (all labs ordered are listed, but only abnormal results are displayed)  Labs Reviewed  COMPREHENSIVE METABOLIC PANEL - Abnormal; Notable for the  following components:      Result Value   Sodium 134 (*)    Chloride 96 (*)    Glucose, Bld 136 (*)    All other components within normal limits  CBC - Abnormal; Notable for the following components:   WBC 18.7 (*)    HCT 46.2 (*)    RDW 16.4 (*)    All other components within normal limits  LACTIC ACID, PLASMA - Abnormal; Notable for the following components:   Lactic Acid, Venous 2.4 (*)    All other components within normal limits  LACTIC ACID, PLASMA - Abnormal; Notable for the following components:   Lactic Acid, Venous 2.0 (*)    All other components within normal limits  HEMOGLOBIN A1C - Abnormal; Notable for the following components:   Hgb A1c MFr Bld 5.9 (*)    All other components within normal limits  GLUCOSE, CAPILLARY -  Abnormal; Notable for the following components:   Glucose-Capillary 122 (*)    All other components within normal limits  BASIC METABOLIC PANEL - Abnormal; Notable for the following components:   Sodium 134 (*)    Glucose, Bld 135 (*)    Calcium 7.6 (*)    All other components within normal limits  CBC - Abnormal; Notable for the following components:   WBC 22.1 (*)    Hemoglobin 11.8 (*)    RDW 16.7 (*)    All other components within normal limits  GLUCOSE, CAPILLARY - Abnormal; Notable for the following components:   Glucose-Capillary 118 (*)    All other components within normal limits  GLUCOSE, CAPILLARY - Abnormal; Notable for the following components:   Glucose-Capillary 131 (*)    All other components within normal limits  GLUCOSE, CAPILLARY - Abnormal; Notable for the following components:   Glucose-Capillary 133 (*)    All other components within normal limits  GLUCOSE, CAPILLARY - Abnormal; Notable for the following components:   Glucose-Capillary 125 (*)    All other components within normal limits  GLUCOSE, CAPILLARY - Abnormal; Notable for the following components:   Glucose-Capillary 145 (*)    All other components within  normal limits  GLUCOSE, CAPILLARY - Abnormal; Notable for the following components:   Glucose-Capillary 125 (*)    All other components within normal limits  GLUCOSE, CAPILLARY - Abnormal; Notable for the following components:   Glucose-Capillary 122 (*)    All other components within normal limits  BASIC METABOLIC PANEL - Abnormal; Notable for the following components:   Sodium 133 (*)    Glucose, Bld 133 (*)    Calcium 7.6 (*)    All other components within normal limits  CBC - Abnormal; Notable for the following components:   WBC 12.9 (*)    Hemoglobin 11.9 (*)    RDW 16.6 (*)    Platelets 148 (*)    All other components within normal limits  GLUCOSE, CAPILLARY - Abnormal; Notable for the following components:   Glucose-Capillary 123 (*)    All other components within normal limits  GLUCOSE, CAPILLARY - Abnormal; Notable for the following components:   Glucose-Capillary 146 (*)    All other components within normal limits  GLUCOSE, CAPILLARY - Abnormal; Notable for the following components:   Glucose-Capillary 128 (*)    All other components within normal limits  CBC WITH DIFFERENTIAL/PLATELET - Abnormal; Notable for the following components:   RDW 16.2 (*)    All other components within normal limits  GLUCOSE, CAPILLARY - Abnormal; Notable for the following components:   Glucose-Capillary 112 (*)    All other components within normal limits  GLUCOSE, CAPILLARY - Abnormal; Notable for the following components:   Glucose-Capillary 110 (*)    All other components within normal limits  CULTURE, BLOOD (ROUTINE X 2)  CULTURE, BLOOD (ROUTINE X 2)  SARS CORONAVIRUS 2 BY RT PCR (HOSPITAL ORDER, [facility])  URINE CULTURE  LIPASE, BLOOD  MAGNESIUM  TSH  MAGNESIUM  URINALYSIS, ROUTINE W REFLEX MICROSCOPIC  TROPONIN I (HIGH SENSITIVITY)  TROPONIN I (HIGH SENSITIVITY)   ____________________________________________  EKG   EKG  Interpretation  Date/Time:  Friday Dec 12 2019 12:38:51 EDT Ventricular Rate:  111 PR Interval:    QRS Duration: 122 QT Interval:  341 QTC Calculation: 429 R Axis:   65 Text Interpretation: Sinus tachycardia Ventricular tachycardia, unsustained Nonspecific intraventricular conduction delay No STEMI Confirmed by Dajsha Massaro,  Vonna Kotyk [phone]) on 12/12/2019 12:45:21 PM       ____________________________________________  RADIOLOGY  CT abdomen/pelvis reviewed.  ____________________________________________   PROCEDURES  Procedure(s) performed:   Procedures  CRITICAL CARE Performed by: Margette Fast Total critical care time: 35 minutes Critical care time was exclusive of separately billable procedures and treating other patients. Critical care was necessary to treat or prevent imminent or life-threatening deterioration. Critical care was time spent personally by me on the following activities: development of treatment plan with patient and/or surrogate as well as nursing, discussions with consultants, evaluation of patient's response to treatment, examination of patient, obtaining history from patient or surrogate, ordering and performing treatments and interventions, ordering and review of laboratory studies, ordering and review of radiographic studies, pulse oximetry and re-evaluation of patient's condition.  Nanda Quinton, MD Emergency Medicine  ____________________________________________   INITIAL IMPRESSION / ASSESSMENT AND PLAN / ED COURSE  Pertinent labs & imaging results that were available during my care of the patient were reviewed by me and considered in my medical decision making (see chart for details).   Patient presents emergency department for evaluation of diarrhea and abdominal pain starting this morning.  She is found to be febrile with tachycardia here.  Suspect intra-abdominal process and likely source.  She does have tenderness in the lower abdomen.  No UTI symptoms.   Will need to rule out sepsis and obtain CT scan of the abdomen and pelvis.  Patient also noted to have frequent PVCs and occasional runs of nonsustained ventricular tachycardia.  I have added on a TSH and magnesium in addition to sepsis labs.   CT results reviewed along with labs. Lactate slightly elevated. Started Zosyn. Discussed case with Dr. Constance Haw who reviewed the CT imaged. Plan for admit and abx. No immediate surgery plan.   Discussed patient's case with [facility] to request admission. Patient and family (if present) updated with plan. Care transferred to [facility] service.  I reviewed all nursing notes, vitals, pertinent old records, EKGs, labs, imaging (as available).  ____________________________________________  FINAL CLINICAL IMPRESSION(S) / ED DIAGNOSES  Final diagnoses:  Diverticulitis of large intestine with perforation without bleeding     MEDICATIONS GIVEN DURING THIS VISIT:  Medications  sodium chloride 0.9 % bolus 500 mL (500 mLs Intravenous Not Given 12/12/19 1317)  morphine 4 MG/ML injection 4 mg (0 mg Intravenous Hold 12/12/19 1305)  acetaminophen (TYLENOL) tablet 650 mg (650 mg Oral Given 12/13/19 0627)  aspirin tablet 325 mg (325 mg Oral Given 12/14/19 0850)  pravastatin (PRAVACHOL) tablet 10 mg (10 mg Oral Given 12/14/19 1726)  pantoprazole (PROTONIX) EC tablet 40 mg (40 mg Oral Given 12/14/19 0850)  mometasone-formoterol (DULERA) 200-5 MCG/ACT inhaler 2 puff (2 puffs Inhalation Given 12/15/19 0811)  heparin injection 5,000 Units (5,000 Units Subcutaneous Given 12/15/19 0605)  0.9 %  sodium chloride infusion ( Intravenous New Bag/Given 12/14/19 0619)  oxyCODONE (Oxy IR/ROXICODONE) immediate release tablet 5 mg (5 mg Oral Given 12/13/19 2159)  hydrALAZINE (APRESOLINE) injection 5 mg (has no administration in time range)  piperacillin-tazobactam (ZOSYN) IVPB 3.375 g (3.375 g Intravenous New Bag/Given 12/15/19 0605)  insulin aspart (novoLOG) injection 0-9 Units (0 Units Subcutaneous  Not Given 12/15/19 0751)  insulin aspart (novoLOG) injection 0-5 Units (0 Units Subcutaneous Not Given 12/15/19 0323)  feeding supplement (GLUCERNA SHAKE) (GLUCERNA SHAKE) liquid 237 mL (237 mLs Oral Given 12/14/19 1330)  ALPRAZolam (XANAX) tablet 0.5 mg (has no administration in time range)  0.9 %  sodium chloride infusion ( Intravenous Stopped  12/12/19 1430)  ondansetron (ZOFRAN) injection 4 mg (4 mg Intravenous Given 12/12/19 1311)  piperacillin-tazobactam (ZOSYN) IVPB 3.375 g (0 g Intravenous Stopped 12/12/19 1500)  acetaminophen (TYLENOL) tablet 1,000 mg (1,000 mg Oral Given 12/12/19 1344)  iohexol (OMNIPAQUE) 300 MG/ML solution 100 mL (100 mLs Intravenous Contrast Given 12/12/19 1444)     Note:  This document was prepared using Dragon voice recognition software and may include unintentional dictation errors.  Nanda Quinton, MD, [facility] Emergency Medicine    Brooklyne Radke, [facility], MD 12/15/19 [phone]

## 2019-12-12 NOTE — ED Notes (Signed)
Attempt to call fam to apprise of pt admission   No answer at 2 numbers  Screeners will continue to seek daughter

## 2019-12-12 NOTE — H&P (Signed)
TRH H&P   Patient Demographics:    [name], is a 79 y.o. female  MRN: [id number]   DOB - 1940-12-31  Admit Date - 12/12/2019  Outpatient Primary MD for the patient is Rosita Fire, MD  Referring MD/NP/PA: Dr Laverta Baltimore  Patient coming from: Home  Chief Complaint  Patient presents with  . Diarrhea      HPI:    [name]  is a 79 y.o. female, has medical history of hypertension, hyperlipidemia, diabetes mellitus, tobacco abuse, patient presents to ED secondary to complaints of abdominal pain, reports pain started 6:00 this morning, appears to be of a sudden onset, as well she reports multiple episodes of nonbloody diarrhea, ports some chills at home, but she was febrile in ED, reports some fatigue, ports abdominal pain is constant, nonradiating, she did not have such pain in the past, does have history of diverticulitis, but not experiencing such pain before, he denies dysuria, polyuria, cough, congestion or COVID-19 exposure. - in ED patient was noted Abrol 101, blood pressure on the lower side, she had leukocytosis when, elevated lactic acid at 2.4, CT abdomen pelvis significant for cecal diverticulitis with early perforation, patient was started on IV Zosyn, general surgery were consulted and treated hospitalist consulted to admit.    Review of systems:    In addition to the HPI above, Reports fever and chills No Headache, No changes with Vision or hearing, No problems swallowing food or Liquids, No Chest pain, Cough or Shortness of Breath, Complains of abdominal pain, nausea, but no Vommitting, Bowel movements are regular, No Blood in stool or Urine, No dysuria, No new skin rashes or bruises, No new joints pains-aches,  No new weakness, tingling, numbness in any extremity, No recent weight gain or loss, No polyuria, polydypsia or polyphagia, No significant Mental  Stressors.  A full 10 point Review of Systems was done, except as stated above, all other Review of Systems were negative.   With Past History of the following :    Past Medical History:  Diagnosis Date  . Breast CA ([place]) 01/16/2011  . Breast cancer ([place])    rt dx 2010; mastectomy only  . Diverticulitis   . Embolism - blood clot    left lung  . History of prolapse of bladder    patient has gellhorn pessary  . Hypertension   . Infiltrating ductal carcinoma of breast ([place]) 01/16/2011   Started Arimidex on 02/24/2009. Stage IB (T1b N0 M0) grade 1 infiltrating ductal carcinoma of the right breast, status post right modified radical mastectomy on 01/22/2009 for a 10 mm cancer, ER +100%, PR +98%, KI-67 marker was 9%, HER-2 negative, no LVI identified, and 11 lymph nodes were negative, all margins were clear. She had some associated DCIS, is now on Arimidex and she will take that for  . Stroke ([place])   . Tendonitis 07/2010   shot  of cortisone      Past Surgical History:  Procedure Laterality Date  . ABDOMINAL HYSTERECTOMY  1987  . APPENDECTOMY  1974   blood clot in lung  . BREAST BIOPSY  1968   left  . COLONOSCOPY N/A 01/11/2018   Procedure: COLONOSCOPY;  Surgeon: Danie Binder, MD;  Location: [facility];  Service: Endoscopy;  Laterality: N/A;  10:00  . MASTECTOMY MODIFIED RADICAL  01/22/2009   rt  . NASAL SINUS SURGERY  2002  . POLYPECTOMY  01/11/2018   Procedure: POLYPECTOMY;  Surgeon: Danie Binder, MD;  Location: [facility];  Service: Endoscopy;;  ascending; descending;  . TEAR DUCT PROBING    . tubal ligation  1972  . YAG LASER APPLICATION Right 0/17/4944   Procedure: YAG LASER APPLICATION;  Surgeon: Williams Che, MD;  Location: [facility] ORS;  Service: Ophthalmology;  Laterality: Right;      Social History:     Social History   Tobacco Use  . Smoking status: Current Every Day Smoker    Packs/day: 0.50    Years: 62.00    Pack years: 31.00    Types: Cigarettes    . Smokeless tobacco: Never Used  Substance Use Topics  . Alcohol use: Yes    Comment: occ     Family History :     Family History  Problem Relation Age of Onset  . Breast cancer Sister   . Breast cancer Maternal Grandmother   . Colon cancer Daughter 63  . Hypertension Paternal Grandfather   . Stroke Paternal Grandfather   . Other Paternal Grandmother        hardening of arteries  . Other Father        gun shot wound  . Cancer Sister   . Cancer Daughter   . Stroke Son   . Heart attack Son   . Gastric cancer Neg Hx   . Esophageal cancer Neg Hx      Home Medications:   Prior to Admission medications   Medication Sig Start Date End Date Taking? Authorizing Provider  acetaminophen (TYLENOL) 650 MG CR tablet Take 650 mg by mouth as needed for pain.    [provider]  albuterol (PROVENTIL HFA;VENTOLIN HFA) 108 (90 Base) MCG/ACT inhaler Inhale 1 puff into the lungs every 6 (six) hours as needed for wheezing or shortness of breath.    [provider]  alendronate (FOSAMAX) 70 MG tablet  11/04/19   [provider]  amLODipine (NORVASC) 5 MG tablet Take 5 mg by mouth daily.    [provider]  aspirin 325 MG tablet Take 1 tablet (325 mg total) by mouth daily. 03/24/16   Rosita Fire, MD  aspirin EC 325 MG tablet Take 325 mg by mouth daily. 11/03/19   [provider]  calcium carbonate (OSCAL) 1500 (600 Ca) MG TABS tablet Take 600 mg of elemental calcium by mouth 2 (two) times daily with a meal.    [provider]  calcium-vitamin D (OSCAL WITH D) 500-200 MG-UNIT TABS tablet Take 1 tablet by mouth in the morning, at noon, and at bedtime. 11/03/19   [provider]  diphenoxylate-atropine (LOMOTIL) 2.5-0.025 MG tablet Take 1 tablet by mouth every 6 (six) hours as needed.  09/22/19   [provider]  ferrous sulfate 325 (65 FE) MG tablet Take 325 mg by mouth daily with breakfast.    [provider]  fluticasone  (FLONASE) 50 MCG/ACT nasal spray Place 2 sprays  into both nostrils as needed.  01/12/16   [provider]  gabapentin (NEURONTIN) 600 MG tablet Take 600 mg by mouth 3 (three) times daily.  06/23/19   [provider]  lovastatin (MEVACOR) 40 MG tablet Take 40 mg by mouth at bedtime.     [provider]  metFORMIN (GLUCOPHAGE) 500 MG tablet Take 500 mg by mouth 2 (two) times daily.  02/12/16   [provider]  metoprolol tartrate (LOPRESSOR) 25 MG tablet Take 25 mg by mouth daily.     [provider]  metroNIDAZOLE (METROGEL) 0.75 % vaginal gel Place 1 Applicatorful vaginally once a week. one applicatorful to vagina at bedtime weekly , as needed for vaginal discharge with pessary 10/31/17   Jonnie Kind, MD  MITIGARE 0.6 MG CAPS  11/18/19   [provider]  naphazoline-glycerin (CLEAR EYES) 0.012-0.2 % SOLN Place 2 drops into both eyes daily as needed for irritation.    [provider]  naproxen (NAPROSYN) 500 MG tablet  11/14/19   [provider]  omeprazole (PRILOSEC) 20 MG capsule Take 1 capsule by mouth daily. 06/28/13   [provider]  SYMBICORT 160-4.5 MCG/ACT inhaler Inhale 2 puffs into the lungs as needed.  06/23/19   [provider]  tiotropium (SPIRIVA) 18 MCG inhalation capsule Place 18 mcg into inhaler and inhale daily as needed.    [provider]  TRADJENTA 5 MG TABS tablet Take 5 mg by mouth daily.  02/12/16   [provider]     Allergies:     Allergies  Allergen Reactions  . Pollen Extract Other (See Comments)    Runny nose, watery eyes and ears get stopped up  . Red Dye      Physical Exam:   Vitals  Blood pressure (!) 106/59, pulse (!) 105, temperature 97.7 F (36.5 C), temperature source Oral, resp. rate (!) 34, height 5' 5"  (1.651 m), weight 59 kg, SpO2 100 %.   1. General frail elderly female laying in bed in mild discomfort secondary to pain  2. Normal  affect and insight, Not Suicidal or Homicidal, Awake Alert, Oriented X 3.  3. No F.N deficits, ALL C.Nerves Intact, Strength 5/5 all 4 extremities, Sensation intact all 4 extremities, Plantars down going.  4. Ears and Eyes appear Normal, Conjunctivae clear, PERRLA. Moist Oral Mucosa.  5. Supple Neck, No JVD, No cervical lymphadenopathy appriciated, No Carotid Bruits.  6. Symmetrical Chest wall movement, Good air movement bilaterally, CTAB.  7. Tachycardic, No Gallops, Rubs or Murmurs, No Parasternal Heave.  8. Positive Bowel Sounds, Abdomen Soft, LLQ tenderness, No organomegaly appriciated,No rebound -guarding or rigidity.  9.  No Cyanosis, Normal Skin Turgor, No Skin Rash or Bruise.  10. Good muscle tone,  joints appear normal , no effusions, Normal ROM.  11. No Palpable Lymph Nodes in Neck or Axillae    Data Review:    CBC Recent Labs  Lab 12/12/19 1249  WBC 18.7*  HGB 14.7  HCT 46.2*  PLT 215  MCV 93.3  MCH 29.7  MCHC 31.8  RDW 16.4*   ------------------------------------------------------------------------------------------------------------------  Chemistries  Recent Labs  Lab 12/12/19 1249  NA 134*  K 4.0  CL 96*  CO2 27  GLUCOSE 136*  BUN 8  CREATININE 0.64  CALCIUM 8.9  MG 1.7  AST 17  ALT 13  ALKPHOS 55  BILITOT 0.8   ------------------------------------------------------------------------------------------------------------------ estimated creatinine clearance is 50.5 mL/min (by C-G formula based on SCr of 0.64 mg/dL). ------------------------------------------------------------------------------------------------------------------  Recent Labs    12/12/19 1249  TSH 1.226    Coagulation profile No results for input(s): INR, PROTIME in the last 168 hours. ------------------------------------------------------------------------------------------------------------------- No results for input(s): DDIMER in the last 72  hours. -------------------------------------------------------------------------------------------------------------------  Cardiac Enzymes No results for input(s): CKMB, TROPONINI, MYOGLOBIN in the last 168 hours.  Invalid input(s): CK ------------------------------------------------------------------------------------------------------------------ No results found for: BNP   ---------------------------------------------------------------------------------------------------------------  Urinalysis    Component Value Date/Time   COLORURINE AMBER (A) 07/31/2017 0949   APPEARANCEUR TURBID (A) 07/31/2017 0949   LABSPEC 1.010 07/31/2017 0949   PHURINE 6.0 07/31/2017 0949   GLUCOSEU NEGATIVE 07/31/2017 0949   HGBUR SMALL (A) 07/31/2017 0949   BILIRUBINUR NEGATIVE 07/31/2017 0949   KETONESUR NEGATIVE 07/31/2017 0949   PROTEINUR 100 (A) 07/31/2017 0949   UROBILINOGEN 0.2 08/02/2013 1440   NITRITE POSITIVE (A) 07/31/2017 0949   LEUKOCYTESUR MODERATE (A) 07/31/2017 0949    ----------------------------------------------------------------------------------------------------------------   Imaging Results:    CT ABDOMEN PELVIS W CONTRAST  Result Date: 12/12/2019 CLINICAL DATA:  Lower abdominal pain since last night, multiple bouts of diarrhea, history of breast cancer, diverticulitis, appendectomy, hysterectomy EXAM: CT ABDOMEN AND PELVIS WITH CONTRAST TECHNIQUE: Multidetector CT imaging of the abdomen and pelvis was performed using the standard protocol following bolus administration of intravenous contrast. CONTRAST:  165m OMNIPAQUE IOHEXOL 300 MG/ML  SOLN COMPARISON:  CT abdomen pelvis 04/29/2017 FINDINGS: Lower chest: Extensive subpleural reticular changes throughout the lung bases likely reflecting some chronic interstitial lung disease, previously characterized as likely UIP. Overall extent of disease in the visible bases is similar to minimally increased. Normal heart size. No  pericardial effusion. Coronary atherosclerosis. Mitral annular calcification. Hepatobiliary: No focal liver abnormality is seen. No gallstones, gallbladder wall thickening, or biliary dilatation. Pancreas: Unremarkable. No pancreatic ductal dilatation or surrounding inflammatory changes. Spleen: Normal in size without focal abnormality. Adrenals/Urinary Tract: Normal adrenal glands continued stability of a retroperitoneal lesion adjacent the right renal fossa. Kidneys enhance and excrete symmetrically. Few simple appearing renal cysts. No concerning renal lesions. No urolithiasis or hydronephrosis. Normal bladder. Suspect pelvic floor laxity with cystocele. Stomach/Bowel: Small sliding-type hiatal hernia. Esophagus, stomach and duodenum are otherwise unremarkable. Mild thickening at the terminal ileum with more pronounced circumferential thickening and phlegmonous change centered upon the cecum as well as a an outpouching along the anterior margin which appears to reflect a contained volume of air and fluid with extensive surrounding inflammatory phlegmonous change as well as some associated reactive thickening of the slightly redundant proximal transverse colon which courses in the immediate vicinity. This may arise from right-sided diverticula seen in this vicinity on comparison CT more distal colonic segments are free of thickening or dilatation. Scattered colonic diverticula throughout much of the distal colon are unremarkable. Vascular/Lymphatic: Atherosclerotic plaque within aorta and branch vessels. Focal fusiform ectasia of the infrarenal abdominal aorta up to 2 cm. No other aneurysm or ectasia. Reactive adenopathy in the abdomen. No pathologically enlarged abdominopelvic nodes. Reproductive: Uterus is surgically absent. No concerning adnexal lesions. Other: Contained air and fluid about the cecum as above with surrounding phlegmon. No free air or fluid in the abdomen or pelvis. No bowel containing hernias.  Musculoskeletal: Stable superior endplate deformity TT06with superimposed Schmorl's node. Stepwise retrolisthesis L2-L5 is unchanged from prior. Multilevel degenerative changes are present in the imaged portions of the spine. Additional degenerative changes in the SI joints and hips. No acute osseous abnormality or suspicious osseous lesion. IMPRESSION: 1. Contained volume of air and fluid about the cecum with extensive  surrounding inflammatory phlegmonous change as well as some associated reactive thickening of the slightly redundant proximal transverse colon which courses in the immediate vicinity. Findings are favored to represent a perforated diverticulitis with contained collection/abscess. 2. Extensive subpleural reticular changes throughout the lung bases likely reflecting some chronic interstitial lung disease, previously characterized as likely UIP. Overall extent of disease in the visible bases is similar to minimally increased. 3. Small sliding-type hiatal hernia. 4. Suspect pelvic floor laxity with cystocele. 5. Stable remote compression deformity T11 6. Aortic Atherosclerosis (ICD10-I70.0). These results were called by telephone at the time of interpretation on 12/12/2019 at 3:29 pm to provider JOSHUA LONG , who verbally acknowledged these results. Electronically Signed   By: Lovena Le M.D.   On: 12/12/2019 15:29   DG Chest Portable 1 View  Result Date: 12/12/2019 CLINICAL DATA:  Upper abdominal pain, fever EXAM: PORTABLE CHEST 1 VIEW COMPARISON:  03/22/2016 FINDINGS: Diffuse coarsened interstitial prominence throughout the lungs is similar prior study compatible with chronic lung disease/fibrosis. No acute confluent airspace opacity or effusion. Heart is normal size. Aortic atherosclerosis. No acute bony abnormality. IMPRESSION: Chronic lung disease/fibrosis.  No acute cardiopulmonary disease. Electronically Signed   By: Rolm Baptise M.D.   On: 12/12/2019 13:32    My personal review of EKG:  Rhythm Sinus Tach, Rate  111 /min  Assessment & Plan:    Active Problems:   Benign essential HTN   Interstitial lung disease (HCC)   Sepsis ([place])   Sepsis secondary to acute diverticulitis with possible early perforation. -Sepsis present on admission, elevated lactic acid, tachycardic, febrile with leukocytosis. -This is secondary to acute diverticulitis with early perforation. -Management per general surgery. -Keep on clear liquid diet, continue with IV Zosyn for intra-abdominal infection, follow on blood cultures and adjust antibiotics as needed, continue with as needed pain medications, for now as discussed, surgical continue observation on IV antibiotics, so far need for IR intervention, but it indicated at a later date if she is developing clear fluid collection or abscess.  History of CVA -Continue with aspirin  Diabetes mellitus -Hold Metformin, continue with Tradjenta, will start on sliding scale  Hyperlipidemia -Continue with statin  Hypertension -Continue to hold home medications given soft blood pressure, will keep on as needed hydralazine meanwhile  Tobacco abuse -He was counseled, will start on nicotine patch.    DVT Prophylaxis Heparin   AM Labs Ordered, also please review Full Orders  Family Communication: Admission, patients condition and plan of care including tests being ordered have been discussed with the patient and Daughter who indicate understanding and agree with the plan and Code Status.  Code Status full  Likely DC to  Home  Condition GUARDED    Consults called: General surgery  Admission status: Inopatient  Time spent in minutes : 60 minutes   Phillips Climes M.D on 12/12/2019 at 5:15 PM  Between 7am to 7pm - Pager - [phone]. After 7pm go to [email or web address] - password [facility]  [facility] - Office  [phone]

## 2019-12-12 NOTE — ED Notes (Signed)
Report to Larene Beach, RN, CN

## 2019-12-12 NOTE — Progress Notes (Signed)
Patient Mews score noted to be 3 as BP was in the 90's and temp @100 .8. BP rechecked manually and found to be 120/60. Tylenol 650 given PO for temp. RN will reevaluate temp for tylenol effectiveness  and monitor patient as shift ensues

## 2019-12-12 NOTE — ED Triage Notes (Signed)
Pt reports "I have diarrhea about all the time"  Hx of diverticulitis  abd pain since last night   Reports D x 3 since awakening today

## 2019-12-12 NOTE — Progress Notes (Signed)
[name] is still not been ok by Pharmacy will check with nurse. Unable to obtain from [facility] at this time.

## 2019-12-12 NOTE — ED Notes (Signed)
Daughter at bedside  Advised that her mother has a temp and more blankets are not advisable   She advises that mother has had a stroke in past and have we done blood work for same  She is advised that blood work has been done   EDP has evaluated  That we are awaiting a CT of her abd

## 2019-12-12 NOTE — ED Notes (Signed)
Bed ready   Call for report   RN will call backj

## 2019-12-12 NOTE — ED Notes (Signed)
Call for report   Nurse unaware she has been given new pt

## 2019-12-12 NOTE — Consult Note (Signed)
[name] Consult  Reason for Consult:Cecal diverticulitis w/ perforation  Referring Physician: Dr. Laverta [name] (ED)   Chief Complaint    Diarrhea      HPI: [name] is a 79 y.o. female with a history of DM, HTN, prior diverticulitis episodes and prior history of breast cancer.  She comes in with abdominal pain that started in the AM and diarrhea. She has not had any associated fevers and has been feeling some fatigue.  She denied any nausea or vomiting, and mostly is having pai in the right lower quadrant.   Given the severity of the pain was which sharp and constant she came to the ED.  She had a colonoscopy in 2019 that demonstrated a very looping colon and polyps with Dr. Oneida [name].    Past Medical History:  Diagnosis Date  . Breast CA ([place]) 01/16/2011  . Breast cancer ([place])    rt dx 2010; mastectomy only  . Diverticulitis   . Embolism - blood clot    left lung  . History of prolapse of bladder    patient has gellhorn pessary  . Hypertension   . Infiltrating ductal carcinoma of breast ([place]) 01/16/2011   Started Arimidex on 02/24/2009. Stage IB (T1b N0 M0) grade 1 infiltrating ductal carcinoma of the right breast, status post right modified radical mastectomy on 01/22/2009 for a 10 mm cancer, ER +100%, PR +98%, KI-67 marker was 9%, HER-2 negative, no LVI identified, and 11 lymph nodes were negative, all margins were clear. She had some associated DCIS, is now on Arimidex and she will take that for  . Stroke ([place])   . Tendonitis 07/2010   shot of cortisone    Past Surgical History:  Procedure Laterality Date  . ABDOMINAL HYSTERECTOMY  1987  . APPENDECTOMY  1974   blood clot in lung  . BREAST BIOPSY  1968   left  . COLONOSCOPY N/A 01/11/2018   Procedure: COLONOSCOPY;  Surgeon: Lauren Binder, MD;  Location: [facility];  Service: Endoscopy;  Laterality: N/A;  10:00  . MASTECTOMY MODIFIED RADICAL  01/22/2009   rt  . NASAL SINUS SURGERY  2002  . POLYPECTOMY   01/11/2018   Procedure: POLYPECTOMY;  Surgeon: Lauren Binder, MD;  Location: [facility];  Service: Endoscopy;;  ascending; descending;  . TEAR DUCT PROBING    . tubal ligation  1972  . YAG LASER APPLICATION Right 9/47/0962   Procedure: YAG LASER APPLICATION;  Surgeon: Williams Che, MD;  Location: [facility] ORS;  Service: Ophthalmology;  Laterality: Right;    Family History  Problem Relation Age of Onset  . Breast cancer Sister   . Breast cancer Maternal Grandmother   . Colon cancer Daughter 66  . Hypertension Paternal Grandfather   . Stroke Paternal Grandfather   . Other Paternal Grandmother        hardening of arteries  . Other Father        gun shot wound  . Cancer Sister   . Cancer Daughter   . Stroke Son   . Heart attack Son   . Gastric cancer Neg Hx   . Esophageal cancer Neg Hx     Social History   Tobacco Use  . Smoking status: Current Every Day Smoker    Packs/day: 0.50    Years: 62.00    Pack years: 31.00    Types: Cigarettes  . Smokeless tobacco: Never Used  Substance Use Topics  . Alcohol use: Yes  Comment: occ  . Drug use: No    Medications: I have reviewed the patient's current medications. Current Facility-Administered Medications  Medication Dose Route Frequency Provider Last Rate Last Admin  . insulin aspart (novoLOG) injection 0-5 Units  0-5 Units Subcutaneous QHS [name], Silver Huguenin, MD      . Derrill Memo ON 12/13/2019] insulin aspart (novoLOG) injection 0-9 Units  0-9 Units Subcutaneous TID WC [name], Silver Huguenin, MD      . morphine 4 MG/ML injection 4 mg  4 mg Intravenous Once Long, Wonda Olds, MD   Stopped at 12/12/19 1305  . piperacillin-tazobactam (ZOSYN) IVPB 3.375 g  3.375 g Intravenous Q8H [name], Dawood S, MD      . sodium chloride 0.9 % bolus 500 mL  500 mL Intravenous Once Long, Wonda Olds, MD        Allergies  Allergen Reactions  . Pollen Extract Other (See Comments)    Runny nose, watery eyes and ears get stopped up  . Red Dye       ROS:  A comprehensive review of systems was negative except for: Constitutional: positive for chills and fatigue Gastrointestinal: positive for abdominal pain and diarrhea  Blood pressure (!) 106/59, pulse (!) 105, temperature 97.7 F (36.5 C), temperature source Oral, resp. rate (!) 34, height '5\' 5"'$  (1.651 m), weight 59 kg, SpO2 100 %. Physical Exam Vitals reviewed.  HENT:     Head: Normocephalic.     Nose: Nose normal.     Mouth/Throat:     Mouth: Mucous membranes are moist.  Eyes:     Extraocular Movements: Extraocular movements intact.  Cardiovascular:     Rate and Rhythm: Normal rate.  Pulmonary:     Effort: Pulmonary effort is normal.  Abdominal:     General: There is no distension.     Palpations: Abdomen is soft.     Tenderness: There is abdominal tenderness in the right lower quadrant and suprapubic area.  Genitourinary:    General: Normal vulva.  Musculoskeletal:        General: No swelling.     Cervical back: Normal range of motion.  Skin:    General: Skin is warm and dry.  Neurological:     General: No focal deficit present.     Mental Status: She is alert and oriented to person, place, and time.  Psychiatric:        Mood and Affect: Mood normal.        Behavior: Behavior normal.        Thought Content: Thought content normal.        Judgment: Judgment normal.     Results: Results for orders placed or performed during the hospital encounter of 12/12/19 (from the past 48 hour(s))  Lipase, blood     Status: None   Collection Time: 12/12/19 12:49 PM  Result Value Ref Range   Lipase 46 11 - 51 U/L    Comment: Performed at [facility], [address] [place], [place] [address]  Comprehensive metabolic panel     Status: Abnormal   Collection Time: 12/12/19 12:49 PM  Result Value Ref Range   Sodium 134 (L) 135 - 145 mmol/L   Potassium 4.0 3.5 - 5.1 mmol/L   Chloride 96 (L) 98 - 111 mmol/L   CO2 27 22 - 32 mmol/L   Glucose, Bld 136 (H) 70 - 99 mg/dL     Comment: Glucose reference range applies only to samples taken after fasting for at least 8  hours.   BUN 8 8 - 23 mg/dL   Creatinine, Ser 0.64 0.44 - 1.00 mg/dL   Calcium 8.9 8.9 - 10.3 mg/dL   Total Protein 8.1 6.5 - 8.1 g/dL   Albumin 4.2 3.5 - 5.0 g/dL   AST 17 15 - 41 U/L   ALT 13 0 - 44 U/L   Alkaline Phosphatase 55 38 - 126 U/L   Total Bilirubin 0.8 0.3 - 1.2 mg/dL   GFR calc non Af Amer >60 >60 mL/min   GFR calc Af Amer >60 >60 mL/min   Anion gap 11 5 - 15    Comment: Performed at [facility], [address], [place], [place] [address]  CBC     Status: Abnormal   Collection Time: 12/12/19 12:49 PM  Result Value Ref Range   WBC 18.7 (H) 4.0 - 10.5 K/uL   RBC 4.95 3.87 - 5.11 MIL/uL   Hemoglobin 14.7 12.0 - 15.0 g/dL   HCT 46.2 (H) 36.0 - 46.0 %   MCV 93.3 80.0 - 100.0 fL   MCH 29.7 26.0 - 34.0 pg   MCHC 31.8 30.0 - 36.0 g/dL   RDW 16.4 (H) 11.5 - 15.5 %   Platelets 215 150 - 400 K/uL   nRBC 0.0 0.0 - 0.2 %    Comment: Performed at [facility], [address], [place], [place] [address]  Troponin I (High Sensitivity)     Status: None   Collection Time: 12/12/19 12:49 PM  Result Value Ref Range   Troponin I (High Sensitivity) 3 <18 ng/L    Comment: (NOTE) Elevated high sensitivity troponin I (hsTnI) values and significant  changes across serial measurements may suggest ACS but many other  chronic and acute conditions are known to elevate hsTnI results.  Refer to the "Links" section for chest pain algorithms and additional  guidance. Performed at [facility], [address] [place], [place] [address]   Lactic acid, plasma     Status: Abnormal   Collection Time: 12/12/19 12:49 PM  Result Value Ref Range   Lactic Acid, Venous 2.4 (HH) 0.5 - 1.9 mmol/L    Comment: CRITICAL RESULT CALLED TO, READ BACK BY AND VERIFIED WITH: WILEY E. AT 1348 ON [id number] BY THOMPSON S. Performed at [facility], [address] [place], [place] [address]   Magnesium     Status: None    Collection Time: 12/12/19 12:49 PM  Result Value Ref Range   Magnesium 1.7 1.7 - 2.4 mg/dL    Comment: Performed at [facility], [address], [place], [place] [address]  TSH     Status: None   Collection Time: 12/12/19 12:49 PM  Result Value Ref Range   TSH 1.226 0.350 - 4.500 uIU/mL    Comment: Performed by a 3rd Generation assay with a functional sensitivity of <=0.01 uIU/mL. Performed at [facility], [address], [place], [place] [address]   Culture, blood (routine x 2)     Status: None (Preliminary result)   Collection Time: 12/12/19  1:20 PM   Specimen: Right Antecubital; Blood  Result Value Ref Range   Specimen Description      RIGHT ANTECUBITAL BOTTLES DRAWN AEROBIC AND ANAEROBIC   Special Requests      Blood Culture adequate volume Performed at [facility], [address], [place], [place] [address]    Culture PENDING    Report Status PENDING   Culture, blood (routine x 2)     Status: None (Preliminary result)  Collection Time: 12/12/19  1:26 PM   Specimen: BLOOD RIGHT HAND  Result Value Ref Range   Specimen Description      BLOOD RIGHT HAND BOTTLES DRAWN AEROBIC AND ANAEROBIC   Special Requests      Blood Culture results may not be optimal due to an inadequate volume of blood received in culture bottles Performed at [facility], [address] [place], [place] [address]    Culture PENDING    Report Status PENDING   SARS Coronavirus 2 by RT PCR (hospital order, performed in [place] hospital lab) Nasopharyngeal Nasopharyngeal Swab     Status: None   Collection Time: 12/12/19  1:55 PM   Specimen: Nasopharyngeal Swab  Result Value Ref Range   SARS Coronavirus 2 NEGATIVE NEGATIVE    Comment: (NOTE) SARS-CoV-2 target nucleic acids are NOT DETECTED. The SARS-CoV-2 RNA is generally detectable in upper and lower respiratory specimens during the acute phase of infection. The lowest concentration of SARS-CoV-2 viral copies this assay can detect is 250 copies /  mL. A negative result does not preclude SARS-CoV-2 infection and should not be used as the sole basis for treatment or other patient management decisions.  A negative result may occur with improper specimen collection / handling, submission of specimen other than nasopharyngeal swab, presence of viral mutation(s) within the areas targeted by this assay, and inadequate number of viral copies (<250 copies / mL). A negative result must be combined with clinical observations, patient history, and epidemiological information. Fact Sheet for Patients:   StrictlyIdeas.no Fact Sheet for Healthcare Providers: BankingDealers.co.za This test is not yet approved or cleared  by the [place] FDA and has been authorized for detection and/or diagnosis of SARS-CoV-2 by FDA under an Emergency Use Authorization (EUA).  This EUA will remain in effect (meaning this test can be used) for the duration of the COVID-19 declaration under Section 564(b)(1) of the Act, 21 U.S.C. section 360bbb-3(b)(1), unless the authorization is terminated or revoked sooner. Performed at [facility], [address], [place], [place] [address]   Lactic acid, plasma     Status: Abnormal   Collection Time: 12/12/19  2:56 PM  Result Value Ref Range   Lactic Acid, Venous 2.0 (HH) 0.5 - 1.9 mmol/L    Comment: CRITICAL VALUE NOTED.  VALUE IS CONSISTENT WITH PREVIOUSLY REPORTED AND CALLED VALUE. Performed at [facility] [address] [place], [place] [address]   Troponin I (High Sensitivity)     Status: None   Collection Time: 12/12/19  2:56 PM  Result Value Ref Range   Troponin I (High Sensitivity) 5 <18 ng/L    Comment: (NOTE) Elevated high sensitivity troponin I (hsTnI) values and significant  changes across serial measurements may suggest ACS but many other  chronic and acute conditions are known to elevate hsTnI results.  Refer to the "Links" section for chest pain  algorithms and additional  guidance. Performed at [facility], [address], [place], [place] [address]    Personally reviewed and reviewed with Dr. [facility]- phelgmon changes around the cecum, no fluid collections that are drainable at this time, inflammation in the surround fatty tissue  CT ABDOMEN PELVIS W CONTRAST  Result Date: 12/12/2019 CLINICAL DATA:  Lower abdominal pain since last night, multiple bouts of diarrhea, history of breast cancer, diverticulitis, appendectomy, hysterectomy EXAM: CT ABDOMEN AND PELVIS WITH CONTRAST TECHNIQUE: Multidetector CT imaging of the abdomen and pelvis was performed using the standard protocol following bolus administration of intravenous contrast.  CONTRAST:  128m OMNIPAQUE IOHEXOL 300 MG/ML  SOLN COMPARISON:  CT abdomen pelvis 04/29/2017 FINDINGS: Lower chest: Extensive subpleural reticular changes throughout the lung bases likely reflecting some chronic interstitial lung disease, previously characterized as likely UIP. Overall extent of disease in the visible bases is similar to minimally increased. Normal heart size. No pericardial effusion. Coronary atherosclerosis. Mitral annular calcification. Hepatobiliary: No focal liver abnormality is seen. No gallstones, gallbladder wall thickening, or biliary dilatation. Pancreas: Unremarkable. No pancreatic ductal dilatation or surrounding inflammatory changes. Spleen: Normal in size without focal abnormality. Adrenals/Urinary Tract: Normal adrenal glands continued stability of a retroperitoneal lesion adjacent the right renal fossa. Kidneys enhance and excrete symmetrically. Few simple appearing renal cysts. No concerning renal lesions. No urolithiasis or hydronephrosis. Normal bladder. Suspect pelvic floor laxity with cystocele. Stomach/Bowel: Small sliding-type hiatal hernia. Esophagus, stomach and duodenum are otherwise unremarkable. Mild thickening at the terminal ileum with more pronounced circumferential  thickening and phlegmonous change centered upon the cecum as well as a an outpouching along the anterior margin which appears to reflect a contained volume of air and fluid with extensive surrounding inflammatory phlegmonous change as well as some associated reactive thickening of the slightly redundant proximal transverse colon which courses in the immediate vicinity. This may arise from right-sided diverticula seen in this vicinity on comparison CT more distal colonic segments are free of thickening or dilatation. Scattered colonic diverticula throughout much of the distal colon are unremarkable. Vascular/Lymphatic: Atherosclerotic plaque within aorta and branch vessels. Focal fusiform ectasia of the infrarenal abdominal aorta up to 2 cm. No other aneurysm or ectasia. Reactive adenopathy in the abdomen. No pathologically enlarged abdominopelvic nodes. Reproductive: Uterus is surgically absent. No concerning adnexal lesions. Other: Contained air and fluid about the cecum as above with surrounding phlegmon. No free air or fluid in the abdomen or pelvis. No bowel containing hernias. Musculoskeletal: Stable superior endplate deformity [id number] superimposed Schmorl's node. Stepwise retrolisthesis L2-L5 is unchanged from prior. Multilevel degenerative changes are present in the imaged portions of the spine. Additional degenerative changes in the SI joints and hips. No acute osseous abnormality or suspicious osseous lesion. IMPRESSION: 1. Contained volume of air and fluid about the cecum with extensive surrounding inflammatory phlegmonous change as well as some associated reactive thickening of the slightly redundant proximal transverse colon which courses in the immediate vicinity. Findings are favored to represent a perforated diverticulitis with contained collection/abscess. 2. Extensive subpleural reticular changes throughout the lung bases likely reflecting some chronic interstitial lung disease, previously  characterized as likely UIP. Overall extent of disease in the visible bases is similar to minimally increased. 3. Small sliding-type hiatal hernia. 4. Suspect pelvic floor laxity with cystocele. 5. Stable remote compression deformity T11 6. Aortic Atherosclerosis (ICD10-I70.0). These results were called by telephone at the time of interpretation on 12/12/2019 at 3:29 pm to provider JOSHUA LONG , who verbally acknowledged these results. Electronically Signed   By: PLovena LeM.D.   On: 12/12/2019 15:29   DG Chest Portable 1 View  Result Date: 12/12/2019 CLINICAL DATA:  Upper abdominal pain, fever EXAM: PORTABLE CHEST 1 VIEW COMPARISON:  03/22/2016 FINDINGS: Diffuse coarsened interstitial prominence throughout the lungs is similar prior study compatible with chronic lung disease/fibrosis. No acute confluent airspace opacity or effusion. Heart is normal size. Aortic atherosclerosis. No acute bony abnormality. IMPRESSION: Chronic lung disease/fibrosis.  No acute cardiopulmonary disease. Electronically Signed   By: KRolm BaptiseM.D.   On: 12/12/2019 13:32     Assessment & Plan:  [name] is a 79 y.o. female with a perforated cecal diverticulitis based on her history and a phelgmon. There is no collection to drain right now. She is having no nausea or vomiting.   -IV Antibiotics  -Clear diet and adv as tolerated  -PRN For pain -If gets worsening pain, leukocytosis or fevers may develop an abscess that needs repeat CT to see if drain possible, hold on IR currently  -No surgical intervention indicated at this time   All questions were answered to the satisfaction of the patient and family.    Virl Cagey 12/12/2019, 5:35 PM

## 2019-12-13 LAB — BASIC METABOLIC PANEL
Anion gap: 8 (ref 5–15)
BUN: 11 mg/dL (ref 8–23)
CO2: 25 mmol/L (ref 22–32)
Calcium: 7.6 mg/dL — ABNORMAL LOW (ref 8.9–10.3)
Chloride: 101 mmol/L (ref 98–111)
Creatinine, Ser: 0.64 mg/dL (ref 0.44–1.00)
GFR calc Af Amer: >60 mL/min (ref >60)
GFR calc non Af Amer: >60 mL/min (ref >60)
Glucose, Bld: 135 mg/dL — ABNORMAL HIGH (ref 70–99)
Potassium: 3.7 mmol/L (ref 3.5–5.1)
Sodium: 134 mmol/L — ABNORMAL LOW (ref 135–145)

## 2019-12-13 LAB — GLUCOSE, CAPILLARY
Glucose-Capillary: 125 mg/dL — ABNORMAL HIGH (ref 70–99)
Glucose-Capillary: 145 mg/dL — ABNORMAL HIGH (ref 70–99)
Glucose-Capillary: 125 mg/dL — ABNORMAL HIGH (ref 70–99)
Glucose-Capillary: 122 mg/dL — ABNORMAL HIGH (ref 70–99)

## 2019-12-13 LAB — CBC
HCT: 37.6 % (ref 36.0–46.0)
Hemoglobin: 11.8 g/dL — ABNORMAL LOW (ref 12.0–15.0)
MCH: 29.4 pg (ref 26.0–34.0)
MCHC: 31.4 g/dL (ref 30.0–36.0)
MCV: 93.8 fL (ref 80.0–100.0)
Platelets: 157 10*3/uL (ref 150–400)
RBC: 4.01 MIL/uL (ref 3.87–5.11)
RDW: 16.7 % — ABNORMAL HIGH (ref 11.5–15.5)
WBC: 22.1 10*3/uL — ABNORMAL HIGH (ref 4.0–10.5)
nRBC: 0 % (ref 0.0–0.2)

## 2019-12-13 NOTE — Progress Notes (Signed)
PROGRESS NOTE    [name]  RCV:[id number] DOB: 1941/01/20 DOA: 12/12/2019 PCP: Rosita Fire, MD   Chief Complaint  Patient presents with  . Diarrhea    Brief Narrative:  79 y.o. female, has medical history of hypertension, hyperlipidemia, diabetes mellitus, tobacco abuse, patient presents to ED secondary to complaints of abdominal pain, reports pain started 6:00 am this morning, appears to be sudden onset, as well she reports multiple episodes of nonbloody diarrhea, reported some chills at home, and found to be febrile in ED, reports some fatigue, and expressed abdominal pain to be constant, nonradiating, she did not have such pain in the past, does have history of diverticulitis, but not experiencing such pain before, she denies dysuria, polyuria, cough, congestion or COVID-19 exposure. - in ED patient was noted Abrol 101, blood pressure on the lower side, she had leukocytosis when, elevated lactic acid at 2.4, CT abdomen pelvis significant for cecal diverticulitis with early perforation, patient was started on IV Zosyn, general surgery was consulted and [facility] hospitalist consulted to admit.  Assessment & Plan: 1-sepsis secondary to diverticulitis of large intestine with perforation  -Sepsis was present on admission; no organ dysfunction associated. -After fluid resuscitation heart rate stable -Will have been pain, elevated WBCs, low-grade temperature -Continue IV antibiotics, conservative management and close monitoring. -Clear liquid diet -Follow general surgery recommendations. -Plan is for repeat CT abdomen on Monday (if patient continue experiencing worsening symptoms); by then most likely if there is a component of buildup abscess, IR will be able to drain.  2-benign essential HTN -Blood pressure soft and stable -Continue holding antihypertensive regimen currently. -As needed hydralazine available if needed.  3-history of type 2 diabetes -Continue holding oral hypoglycemic  agents -Continue sliding scale insulin.  4-history of CVA -No focal deficits -Continue aspirin for secondary prevention -Continue risk factor modification.  5-hyperlipidemia -Continue statins.  6-tobacco abuse -Cessation counseling provided -Continue nicotine patch.     DVT prophylaxis: Heparin Code Status: Full code Family Communication: No family at bedside. Disposition:   Status is: Inpatient  Dispo: The patient is from: Home              Anticipated d/c is to: Home              Anticipated d/c date is: Hopefully by 08/01/56, if no complications.              Patient currently no medically stable to be discharged; still having abdominal pain, experiencing low-grade temperature and with worsening WBCs.  Continue IV antibiotics, supportive care and clear liquid diet.  Follow general surgery recommendations.  Plan to continue conservative management and repeat CT on Monday if needed.        Consultants:   General surgery   Procedures:  See below for x-ray reports.   Antimicrobials:  Zosyn (12/13/19)   Subjective: Temperature overnight; still having abdominal pain with worsening WBCs.  No chest pain, no nausea vomiting, tolerating clear liquid diet.  Feeling slightly better after fluid resuscitation.  Objective: Vitals:   12/12/19 2342 12/13/19 0512 12/13/19 0718 12/13/19 0909  BP: (!) 101/46 (!) 107/56  (!) 98/50  Pulse: 78 86  72  Resp: 17 18  18   Temp: 99.5 F (37.5 C) 100.2 F (37.9 C)  97.9 F (36.6 C)  TempSrc: Oral Oral  Oral  SpO2: 96% 98% 96% 98%  Weight:      Height:        Intake/Output Summary (Last 24 hours) at 12/13/2019  1309 Last data filed at 12/13/2019 0812 Gross per 24 hour  Intake 1303.9 ml  Output --  Net 1303.9 ml   Filed Weights   12/12/19 1231  Weight: 59 kg    Examination: General exam: Reports feeling slightly better; still having abdominal pain and overnight spiking low-grade temperature.  No nausea or  vomiting. Respiratory system: Clear to auscultation. Respiratory effort normal. Cardiovascular system: S1 & S2 heard, RRR. No JVD, murmurs, rubs, gallops or clicks. No pedal edema. Gastrointestinal system: Abdomen is tender to palpation left lower quadrant; no guarding, positive bowel sounds.  No distention. Central nervous system: Alert and oriented. No focal neurological deficits. Extremities: Cyanosis or clubbing. Skin: No rashes, no petechiae. Psychiatry: Judgement and insight appear normal. Mood & affect appropriate.     Data Reviewed: I have personally reviewed following labs and imaging studies  CBC: Recent Labs  Lab 12/12/19 1249 12/13/19 0735  WBC 18.7* 22.1*  HGB 14.7 11.8*  HCT 46.2* 37.6  MCV 93.3 93.8  PLT 215 546    Basic Metabolic Panel: Recent Labs  Lab 12/12/19 1249 12/13/19 0735  NA 134* 134*  K 4.0 3.7  CL 96* 101  CO2 27 25  GLUCOSE 136* 135*  BUN 8 11  CREATININE 0.64 0.64  CALCIUM 8.9 7.6*  MG 1.7  --     GFR: Estimated Creatinine Clearance: 50.5 mL/min (by C-G formula based on SCr of 0.64 mg/dL).  Liver Function Tests: Recent Labs  Lab 12/12/19 1249  AST 17  ALT 13  ALKPHOS 55  BILITOT 0.8  PROT 8.1  ALBUMIN 4.2    CBG: Recent Labs  Lab 12/12/19 2048 12/12/19 2139 12/12/19 2253 12/13/19 0725 12/13/19 1125  GLUCAP 118* 131* 133* 125* 145*     Recent Results (from the past 240 hour(s))  Culture, blood (routine x 2)     Status: None (Preliminary result)   Collection Time: 12/12/19  1:20 PM   Specimen: Right Antecubital; Blood  Result Value Ref Range Status   Specimen Description   Final    RIGHT ANTECUBITAL BOTTLES DRAWN AEROBIC AND ANAEROBIC   Special Requests Blood Culture adequate volume  Final   Culture   Final    NO GROWTH < 24 HOURS Performed at [facility], [address] [place], [place] [address]    Report Status PENDING  Incomplete  Culture, blood (routine x 2)     Status: None (Preliminary result)    Collection Time: 12/12/19  1:26 PM   Specimen: BLOOD RIGHT HAND  Result Value Ref Range Status   Specimen Description   Final    BLOOD RIGHT HAND BOTTLES DRAWN AEROBIC AND ANAEROBIC   Special Requests   Final    Blood Culture results may not be optimal due to an inadequate volume of blood received in culture bottles   Culture   Final    NO GROWTH < 24 HOURS Performed at [facility], [address], [place], [place] [address]    Report Status PENDING  Incomplete  SARS Coronavirus 2 by RT PCR (hospital order, performed in [facility] hospital lab) Nasopharyngeal Nasopharyngeal Swab     Status: None   Collection Time: 12/12/19  1:55 PM   Specimen: Nasopharyngeal Swab  Result Value Ref Range Status   SARS Coronavirus 2 NEGATIVE NEGATIVE Final    Comment: (NOTE) SARS-CoV-2 target nucleic acids are NOT DETECTED. The SARS-CoV-2 RNA is generally detectable in upper and lower respiratory specimens during the acute phase of infection. The  lowest concentration of SARS-CoV-2 viral copies this assay can detect is 250 copies / mL. A negative result does not preclude SARS-CoV-2 infection and should not be used as the sole basis for treatment or other patient management decisions.  A negative result may occur with improper specimen collection / handling, submission of specimen other than nasopharyngeal swab, presence of viral mutation(s) within the areas targeted by this assay, and inadequate number of viral copies (<250 copies / mL). A negative result must be combined with clinical observations, patient history, and epidemiological information. Fact Sheet for Patients:   StrictlyIdeas.no Fact Sheet for Healthcare Providers: BankingDealers.co.za This test is not yet approved or cleared  by the [place] FDA and has been authorized for detection and/or diagnosis of SARS-CoV-2 by FDA under an Emergency Use Authorization (EUA).  This EUA will  remain in effect (meaning this test can be used) for the duration of the COVID-19 declaration under Section 564(b)(1) of the Act, 21 U.S.C. section 360bbb-3(b)(1), unless the authorization is terminated or revoked sooner. Performed at [facility], [address] [place], [place] [address]      Radiology Studies: CT ABDOMEN PELVIS W CONTRAST  Result Date: 12/12/2019 CLINICAL DATA:  Lower abdominal pain since last night, multiple bouts of diarrhea, history of breast cancer, diverticulitis, appendectomy, hysterectomy EXAM: CT ABDOMEN AND PELVIS WITH CONTRAST TECHNIQUE: Multidetector CT imaging of the abdomen and pelvis was performed using the standard protocol following bolus administration of intravenous contrast. CONTRAST:  157mL OMNIPAQUE IOHEXOL 300 MG/ML  SOLN COMPARISON:  CT abdomen pelvis 04/29/2017 FINDINGS: Lower chest: Extensive subpleural reticular changes throughout the lung bases likely reflecting some chronic interstitial lung disease, previously characterized as likely UIP. Overall extent of disease in the visible bases is similar to minimally increased. Normal heart size. No pericardial effusion. Coronary atherosclerosis. Mitral annular calcification. Hepatobiliary: No focal liver abnormality is seen. No gallstones, gallbladder wall thickening, or biliary dilatation. Pancreas: Unremarkable. No pancreatic ductal dilatation or surrounding inflammatory changes. Spleen: Normal in size without focal abnormality. Adrenals/Urinary Tract: Normal adrenal glands continued stability of a retroperitoneal lesion adjacent the right renal fossa. Kidneys enhance and excrete symmetrically. Few simple appearing renal cysts. No concerning renal lesions. No urolithiasis or hydronephrosis. Normal bladder. Suspect pelvic floor laxity with cystocele. Stomach/Bowel: Small sliding-type hiatal hernia. Esophagus, stomach and duodenum are otherwise unremarkable. Mild thickening at the terminal ileum with more  pronounced circumferential thickening and phlegmonous change centered upon the cecum as well as a an outpouching along the anterior margin which appears to reflect a contained volume of air and fluid with extensive surrounding inflammatory phlegmonous change as well as some associated reactive thickening of the slightly redundant proximal transverse colon which courses in the immediate vicinity. This may arise from right-sided diverticula seen in this vicinity on comparison CT more distal colonic segments are free of thickening or dilatation. Scattered colonic diverticula throughout much of the distal colon are unremarkable. Vascular/Lymphatic: Atherosclerotic plaque within aorta and branch vessels. Focal fusiform ectasia of the infrarenal abdominal aorta up to 2 cm. No other aneurysm or ectasia. Reactive adenopathy in the abdomen. No pathologically enlarged abdominopelvic nodes. Reproductive: Uterus is surgically absent. No concerning adnexal lesions. Other: Contained air and fluid about the cecum as above with surrounding phlegmon. No free air or fluid in the abdomen or pelvis. No bowel containing hernias. Musculoskeletal: Stable superior endplate deformity J85 with superimposed Schmorl's node. Stepwise retrolisthesis L2-L5 is unchanged from prior. Multilevel degenerative changes are present in the imaged portions of the spine.  Additional degenerative changes in the SI joints and hips. No acute osseous abnormality or suspicious osseous lesion. IMPRESSION: 1. Contained volume of air and fluid about the cecum with extensive surrounding inflammatory phlegmonous change as well as some associated reactive thickening of the slightly redundant proximal transverse colon which courses in the immediate vicinity. Findings are favored to represent a perforated diverticulitis with contained collection/abscess. 2. Extensive subpleural reticular changes throughout the lung bases likely reflecting some chronic interstitial lung  disease, previously characterized as likely UIP. Overall extent of disease in the visible bases is similar to minimally increased. 3. Small sliding-type hiatal hernia. 4. Suspect pelvic floor laxity with cystocele. 5. Stable remote compression deformity T11 6. Aortic Atherosclerosis (ICD10-I70.0). These results were called by telephone at the time of interpretation on 12/12/2019 at 3:29 pm to provider JOSHUA LONG , who verbally acknowledged these results. Electronically Signed   By: Lovena Le M.D.   On: 12/12/2019 15:29   DG Chest Portable 1 View  Result Date: 12/12/2019 CLINICAL DATA:  Upper abdominal pain, fever EXAM: PORTABLE CHEST 1 VIEW COMPARISON:  03/22/2016 FINDINGS: Diffuse coarsened interstitial prominence throughout the lungs is similar prior study compatible with chronic lung disease/fibrosis. No acute confluent airspace opacity or effusion. Heart is normal size. Aortic atherosclerosis. No acute bony abnormality. IMPRESSION: Chronic lung disease/fibrosis.  No acute cardiopulmonary disease. Electronically Signed   By: Rolm Baptise M.D.   On: 12/12/2019 13:32    Scheduled Meds: . aspirin  325 mg Oral Daily  . heparin  5,000 Units Subcutaneous Q8H  . insulin aspart  0-5 Units Subcutaneous QHS  . insulin aspart  0-9 Units Subcutaneous TID WC  . mometasone-formoterol  2 puff Inhalation BID  . morphine  4 mg Intravenous Once  . pantoprazole  40 mg Oral Daily  . pravastatin  10 mg Oral q1800   Continuous Infusions: . sodium chloride 75 mL/hr at 12/13/19 0812  . piperacillin-tazobactam (ZOSYN)  IV 3.375 g (12/13/19 0641)  . sodium chloride       LOS: 1 day    Time spent: 35 minutes.    Barton Dubois, MD [facility] [phone]   To contact the attending provider between 7A-7P or the covering provider during after hours 7P-7A, please log into the web site [email or web address] and access using universal [place] password for that web site. If you do not have the password,  please call the hospital operator.  12/13/2019, 1:09 PM

## 2019-12-13 NOTE — Progress Notes (Signed)
[facility] Progress Note     Subjective: Some temperatures overnight, tylenol helped. Says she is still with pain. Tolerating clears.   Objective: Vital signs in last 24 hours: Temp:  [97.7 F (36.5 C)-100.8 F (38.2 C)] 97.9 F (36.6 C) (05/15 0909) Pulse Rate:  [47-105] 72 (05/15 0909) Resp:  [16-34] 18 (05/15 0909) BP: (96-123)/(46-96) 98/50 (05/15 0909) SpO2:  [95 %-100 %] 98 % (05/15 0909) Last BM Date: 12/12/19  Intake/Output from previous day: 05/14 0701 - 05/15 0700 In: 1063.9 [P.O.:240; I.V.:773.9; IV Piggyback:50] Out: -  Intake/Output this shift: Total I/O In: 240 [P.O.:240] Out: -   General appearance: alert, cooperative and no distress Resp: normal work of breathing GI: soft, tender RLQ, nondistended  Lab Results:  Recent Labs    12/12/19 1249 12/13/19 0735  WBC 18.7* 22.1*  HGB 14.7 11.8*  HCT 46.2* 37.6  PLT 215 157   BMET Recent Labs    12/12/19 1249 12/13/19 0735  NA 134* 134*  K 4.0 3.7  CL 96* 101  CO2 27 25  GLUCOSE 136* 135*  BUN 8 11  CREATININE 0.64 0.64  CALCIUM 8.9 7.6*   PT/INR No results for input(s): LABPROT, INR in the last 72 hours.  Studies/Results: CT ABDOMEN PELVIS W CONTRAST  Result Date: 12/12/2019 CLINICAL DATA:  Lower abdominal pain since last night, multiple bouts of diarrhea, history of breast cancer, diverticulitis, appendectomy, hysterectomy EXAM: CT ABDOMEN AND PELVIS WITH CONTRAST TECHNIQUE: Multidetector CT imaging of the abdomen and pelvis was performed using the standard protocol following bolus administration of intravenous contrast. CONTRAST:  185mL OMNIPAQUE IOHEXOL 300 MG/ML  SOLN COMPARISON:  CT abdomen pelvis 04/29/2017 FINDINGS: Lower chest: Extensive subpleural reticular changes throughout the lung bases likely reflecting some chronic interstitial lung disease, previously characterized as likely UIP. Overall extent of disease in the visible bases is similar to minimally increased.  Normal heart size. No pericardial effusion. Coronary atherosclerosis. Mitral annular calcification. Hepatobiliary: No focal liver abnormality is seen. No gallstones, gallbladder wall thickening, or biliary dilatation. Pancreas: Unremarkable. No pancreatic ductal dilatation or surrounding inflammatory changes. Spleen: Normal in size without focal abnormality. Adrenals/Urinary Tract: Normal adrenal glands continued stability of a retroperitoneal lesion adjacent the right renal fossa. Kidneys enhance and excrete symmetrically. Few simple appearing renal cysts. No concerning renal lesions. No urolithiasis or hydronephrosis. Normal bladder. Suspect pelvic floor laxity with cystocele. Stomach/Bowel: Small sliding-type hiatal hernia. Esophagus, stomach and duodenum are otherwise unremarkable. Mild thickening at the terminal ileum with more pronounced circumferential thickening and phlegmonous change centered upon the cecum as well as a an outpouching along the anterior margin which appears to reflect a contained volume of air and fluid with extensive surrounding inflammatory phlegmonous change as well as some associated reactive thickening of the slightly redundant proximal transverse colon which courses in the immediate vicinity. This may arise from right-sided diverticula seen in this vicinity on comparison CT more distal colonic segments are free of thickening or dilatation. Scattered colonic diverticula throughout much of the distal colon are unremarkable. Vascular/Lymphatic: Atherosclerotic plaque within aorta and branch vessels. Focal fusiform ectasia of the infrarenal abdominal aorta up to 2 cm. No other aneurysm or ectasia. Reactive adenopathy in the abdomen. No pathologically enlarged abdominopelvic nodes. Reproductive: Uterus is surgically absent. No concerning adnexal lesions. Other: Contained air and fluid about the cecum as above with surrounding phlegmon. No free air or fluid in the abdomen or pelvis. No  bowel containing hernias. Musculoskeletal: Stable superior endplate deformity W25 with superimposed  Schmorl's node. Stepwise retrolisthesis L2-L5 is unchanged from prior. Multilevel degenerative changes are present in the imaged portions of the spine. Additional degenerative changes in the SI joints and hips. No acute osseous abnormality or suspicious osseous lesion. IMPRESSION: 1. Contained volume of air and fluid about the cecum with extensive surrounding inflammatory phlegmonous change as well as some associated reactive thickening of the slightly redundant proximal transverse colon which courses in the immediate vicinity. Findings are favored to represent a perforated diverticulitis with contained collection/abscess. 2. Extensive subpleural reticular changes throughout the lung bases likely reflecting some chronic interstitial lung disease, previously characterized as likely UIP. Overall extent of disease in the visible bases is similar to minimally increased. 3. Small sliding-type hiatal hernia. 4. Suspect pelvic floor laxity with cystocele. 5. Stable remote compression deformity T11 6. Aortic Atherosclerosis (ICD10-I70.0). These results were called by telephone at the time of interpretation on 12/12/2019 at 3:29 pm to provider JOSHUA LONG , who verbally acknowledged these results. Electronically Signed   By: Lovena Le M.D.   On: 12/12/2019 15:29   DG Chest Portable 1 View  Result Date: 12/12/2019 CLINICAL DATA:  Upper abdominal pain, fever EXAM: PORTABLE CHEST 1 VIEW COMPARISON:  03/22/2016 FINDINGS: Diffuse coarsened interstitial prominence throughout the lungs is similar prior study compatible with chronic lung disease/fibrosis. No acute confluent airspace opacity or effusion. Heart is normal size. Aortic atherosclerosis. No acute bony abnormality. IMPRESSION: Chronic lung disease/fibrosis.  No acute cardiopulmonary disease. Electronically Signed   By: Rolm Baptise M.D.   On: 12/12/2019 13:32     Anti-infectives: Anti-infectives (From admission, onward)   Start     Dose/Rate Route Frequency Ordered Stop   12/12/19 2000  piperacillin-tazobactam (ZOSYN) IVPB 3.375 g     3.375 g 12.5 mL/hr over 240 Minutes Intravenous Every 8 hours 12/12/19 1558     12/12/19 1330  piperacillin-tazobactam (ZOSYN) IVPB 3.375 g     3.375 g 100 mL/hr over 30 Minutes Intravenous  Once 12/12/19 1326 12/12/19 1500      Assessment/Plan: Ms. Ellerman is an 79 yo with perforated cecal diverticulitis without drainable collection at the present based on CT. Leukocytosis up some and some fevers. Pain continues. Would continue with course for now. Would hold to repeat scan until Monday pending things not improving, and would then likely have developed abscess that could be drained.  Clear diet again for today IV zosyn for diverticulitis with perf Ambulate   Curlene Labrum, MD [facility] [place], [place] [id number] [phone] (office)    LOS: 1 day    Virl Cagey 12/13/2019

## 2019-12-13 NOTE — Progress Notes (Addendum)
Feels worse than this morning.  Color not as good.  Vitals normal except slight temp 99.7.  Contacted Dr. Dyann Kief and Dr. Constance Haw

## 2019-12-13 NOTE — Progress Notes (Signed)
Ambulated to nurse's station and back.  Walked to bathroom and had medium, brown, soft bm.

## 2019-12-13 NOTE — Progress Notes (Signed)
Temp re-taken and found to be 99.0 orally

## 2019-12-14 DIAGNOSIS — F419 Anxiety disorder, unspecified: Secondary | ICD-10-CM

## 2019-12-14 LAB — BASIC METABOLIC PANEL
Anion gap: 6 (ref 5–15)
BUN: 9 mg/dL (ref 8–23)
CO2: 23 mmol/L (ref 22–32)
Calcium: 7.6 mg/dL — ABNORMAL LOW (ref 8.9–10.3)
Chloride: 104 mmol/L (ref 98–111)
Creatinine, Ser: 0.55 mg/dL (ref 0.44–1.00)
GFR calc Af Amer: >60 mL/min (ref >60)
GFR calc non Af Amer: >60 mL/min (ref >60)
Glucose, Bld: 133 mg/dL — ABNORMAL HIGH (ref 70–99)
Potassium: 4 mmol/L (ref 3.5–5.1)
Sodium: 133 mmol/L — ABNORMAL LOW (ref 135–145)

## 2019-12-14 LAB — GLUCOSE, CAPILLARY
Glucose-Capillary: 123 mg/dL — ABNORMAL HIGH (ref 70–99)
Glucose-Capillary: 146 mg/dL — ABNORMAL HIGH (ref 70–99)
Glucose-Capillary: 128 mg/dL — ABNORMAL HIGH (ref 70–99)
Glucose-Capillary: 112 mg/dL — ABNORMAL HIGH (ref 70–99)

## 2019-12-14 LAB — CBC
HCT: 37.6 % (ref 36.0–46.0)
Hemoglobin: 11.9 g/dL — ABNORMAL LOW (ref 12.0–15.0)
MCH: 29.8 pg (ref 26.0–34.0)
MCHC: 31.6 g/dL (ref 30.0–36.0)
MCV: 94 fL (ref 80.0–100.0)
Platelets: 148 10*3/uL — ABNORMAL LOW (ref 150–400)
RBC: 4 MIL/uL (ref 3.87–5.11)
RDW: 16.6 % — ABNORMAL HIGH (ref 11.5–15.5)
WBC: 12.9 10*3/uL — ABNORMAL HIGH (ref 4.0–10.5)
nRBC: 0 % (ref 0.0–0.2)

## 2019-12-14 LAB — MAGNESIUM: Magnesium: 2 mg/dL (ref 1.7–2.4)

## 2019-12-14 MED ORDER — CLONAZEPAM 0.5 MG PO TABS
0.5000 mg | ORAL_TABLET | Freq: Two times a day (BID) | ORAL | Status: DC | PRN
  Filled 2019-12-14: qty 1

## 2019-12-14 MED ORDER — GLUCERNA SHAKE PO LIQD
237.0000 mL | Freq: Two times a day (BID) | ORAL | Status: DC
  Administered 2019-12-14 – 2019-12-19 (×8): 237 mL via ORAL

## 2019-12-14 MED ORDER — ALPRAZOLAM 0.25 MG PO TABS
0.2500 mg | ORAL_TABLET | Freq: Every evening | ORAL | Status: DC | PRN
  Administered 2019-12-14: 0.25 mg via ORAL
  Filled 2019-12-14: qty 1

## 2019-12-14 MED ORDER — ALPRAZOLAM 0.5 MG PO TABS
0.5000 mg | ORAL_TABLET | Freq: Three times a day (TID) | ORAL | Status: DC | PRN
  Administered 2019-12-15 – 2019-12-19 (×4): 0.5 mg via ORAL
  Filled 2019-12-14 (×5): qty 1

## 2019-12-14 NOTE — Progress Notes (Signed)
Received  xanax for anxiety.  Stood at bedside for bath and then ambulated with walker and standby to toilet for small, brown soft bm.  Ambulated almost to nurse's station and back with walker and standby.  Back in bed. Bed alarm and call bell in place

## 2019-12-14 NOTE — Progress Notes (Signed)
Received call from telemetry that patient had 5 beat run of V-tach. Didn't sustain. Pt resting in bed with eyes closed at this time. On-call clinician Jeannene Patella notified via [place].

## 2019-12-14 NOTE — Progress Notes (Signed)
[facility] Progress Note     Subjective: Walked yesterday and says had BM. Feeling poorly still and having pain she says.   Objective: Vital signs in last 24 hours: Temp:  [97.9 F (36.6 C)-99.7 F (37.6 C)] 98.6 F (37 C) (05/16 0603) Pulse Rate:  [79-92] 92 (05/16 0603) Resp:  [16-18] 18 (05/16 0603) BP: (104-126)/(45-67) 126/67 (05/16 0603) SpO2:  [91 %-99 %] 94 % (05/16 0806) Last BM Date: 12/13/19  Intake/Output from previous day: 05/15 0701 - 05/16 0700 In: 1748.7 [P.O.:1220; I.V.:378.7; IV Piggyback:150] Out: 1450 [Urine:1450] Intake/Output this shift: No intake/output data recorded.  General appearance: alert, cooperative and mild distress Resp: normal work of breathing GI: soft, minimally distended, tender RLQ  Lab Results:  Recent Labs    12/13/19 0735 12/14/19 0640  WBC 22.1* 12.9*  HGB 11.8* 11.9*  HCT 37.6 37.6  PLT 157 148*   BMET Recent Labs    12/13/19 0735 12/14/19 0640  NA 134* 133*  K 3.7 4.0  CL 101 104  CO2 25 23  GLUCOSE 135* 133*  BUN 11 9  CREATININE 0.64 0.55  CALCIUM 7.6* 7.6*   PT/INR No results for input(s): LABPROT, INR in the last 72 hours.  Studies/Results: CT ABDOMEN PELVIS W CONTRAST  Result Date: 12/12/2019 CLINICAL DATA:  Lower abdominal pain since last night, multiple bouts of diarrhea, history of breast cancer, diverticulitis, appendectomy, hysterectomy EXAM: CT ABDOMEN AND PELVIS WITH CONTRAST TECHNIQUE: Multidetector CT imaging of the abdomen and pelvis was performed using the standard protocol following bolus administration of intravenous contrast. CONTRAST:  138mL OMNIPAQUE IOHEXOL 300 MG/ML  SOLN COMPARISON:  CT abdomen pelvis 04/29/2017 FINDINGS: Lower chest: Extensive subpleural reticular changes throughout the lung bases likely reflecting some chronic interstitial lung disease, previously characterized as likely UIP. Overall extent of disease in the visible bases is similar to minimally  increased. Normal heart size. No pericardial effusion. Coronary atherosclerosis. Mitral annular calcification. Hepatobiliary: No focal liver abnormality is seen. No gallstones, gallbladder wall thickening, or biliary dilatation. Pancreas: Unremarkable. No pancreatic ductal dilatation or surrounding inflammatory changes. Spleen: Normal in size without focal abnormality. Adrenals/Urinary Tract: Normal adrenal glands continued stability of a retroperitoneal lesion adjacent the right renal fossa. Kidneys enhance and excrete symmetrically. Few simple appearing renal cysts. No concerning renal lesions. No urolithiasis or hydronephrosis. Normal bladder. Suspect pelvic floor laxity with cystocele. Stomach/Bowel: Small sliding-type hiatal hernia. Esophagus, stomach and duodenum are otherwise unremarkable. Mild thickening at the terminal ileum with more pronounced circumferential thickening and phlegmonous change centered upon the cecum as well as a an outpouching along the anterior margin which appears to reflect a contained volume of air and fluid with extensive surrounding inflammatory phlegmonous change as well as some associated reactive thickening of the slightly redundant proximal transverse colon which courses in the immediate vicinity. This may arise from right-sided diverticula seen in this vicinity on comparison CT more distal colonic segments are free of thickening or dilatation. Scattered colonic diverticula throughout much of the distal colon are unremarkable. Vascular/Lymphatic: Atherosclerotic plaque within aorta and branch vessels. Focal fusiform ectasia of the infrarenal abdominal aorta up to 2 cm. No other aneurysm or ectasia. Reactive adenopathy in the abdomen. No pathologically enlarged abdominopelvic nodes. Reproductive: Uterus is surgically absent. No concerning adnexal lesions. Other: Contained air and fluid about the cecum as above with surrounding phlegmon. No free air or fluid in the abdomen or  pelvis. No bowel containing hernias. Musculoskeletal: Stable superior endplate deformity H41 with superimposed Schmorl's node.  Stepwise retrolisthesis L2-L5 is unchanged from prior. Multilevel degenerative changes are present in the imaged portions of the spine. Additional degenerative changes in the SI joints and hips. No acute osseous abnormality or suspicious osseous lesion. IMPRESSION: 1. Contained volume of air and fluid about the cecum with extensive surrounding inflammatory phlegmonous change as well as some associated reactive thickening of the slightly redundant proximal transverse colon which courses in the immediate vicinity. Findings are favored to represent a perforated diverticulitis with contained collection/abscess. 2. Extensive subpleural reticular changes throughout the lung bases likely reflecting some chronic interstitial lung disease, previously characterized as likely UIP. Overall extent of disease in the visible bases is similar to minimally increased. 3. Small sliding-type hiatal hernia. 4. Suspect pelvic floor laxity with cystocele. 5. Stable remote compression deformity T11 6. Aortic Atherosclerosis (ICD10-I70.0). These results were called by telephone at the time of interpretation on 12/12/2019 at 3:29 pm to provider JOSHUA LONG , who verbally acknowledged these results. Electronically Signed   By: Lovena Le M.D.   On: 12/12/2019 15:29   DG Chest Portable 1 View  Result Date: 12/12/2019 CLINICAL DATA:  Upper abdominal pain, fever EXAM: PORTABLE CHEST 1 VIEW COMPARISON:  03/22/2016 FINDINGS: Diffuse coarsened interstitial prominence throughout the lungs is similar prior study compatible with chronic lung disease/fibrosis. No acute confluent airspace opacity or effusion. Heart is normal size. Aortic atherosclerosis. No acute bony abnormality. IMPRESSION: Chronic lung disease/fibrosis.  No acute cardiopulmonary disease. Electronically Signed   By: Rolm Baptise M.D.   On: 12/12/2019  13:32    Anti-infectives: Anti-infectives (From admission, onward)   Start     Dose/Rate Route Frequency Ordered Stop   12/12/19 2000  piperacillin-tazobactam (ZOSYN) IVPB 3.375 g     3.375 g 12.5 mL/hr over 240 Minutes Intravenous Every 8 hours 12/12/19 1558     12/12/19 1330  piperacillin-tazobactam (ZOSYN) IVPB 3.375 g     3.375 g 100 mL/hr over 30 Minutes Intravenous  Once 12/12/19 1326 12/12/19 1500      Assessment/Plan: [name] is a 79 yo with perforated cecal diverticulitis with unorganized fluid and gas on original CT. Leukocytosis improving, fever curve improving but still with pain. -Discussed with Dr. Dyann Kief and if still with significant pain would repeat CT tomorrow to ensure no drainable collection has organized  -Full liquid diet  -Encouraged her to use pain meds as needed -Ambulate    LOS: 2 days    Virl Cagey 12/14/2019

## 2019-12-14 NOTE — Progress Notes (Signed)
Slept after giving xanax earlier and when woke up was confused as to where she was etc.  Not trying to get up

## 2019-12-14 NOTE — Progress Notes (Addendum)
PROGRESS NOTE    [name]  NID:[id number] DOB: 05-02-1941 DOA: 12/12/2019 PCP: Rosita Fire, MD   Chief Complaint  Patient presents with  . Diarrhea    Brief Narrative:  79 y.o. female, has medical history of hypertension, hyperlipidemia, diabetes mellitus, tobacco abuse, patient presents to ED secondary to complaints of abdominal pain, reports pain started 6:00 am this morning, appears to be sudden onset, as well she reports multiple episodes of nonbloody diarrhea, reported some chills at home, and found to be febrile in ED, reports some fatigue, and expressed abdominal pain to be constant, nonradiating, she did not have such pain in the past, does have history of diverticulitis, but not experiencing such pain before, she denies dysuria, polyuria, cough, congestion or COVID-19 exposure. - in ED patient was noted Abrol 101, blood pressure on the lower side, she had leukocytosis when, elevated lactic acid at 2.4, CT abdomen pelvis significant for cecal diverticulitis with early perforation, patient was started on IV Zosyn, general surgery was consulted and [facility] hospitalist consulted to admit.  Assessment & Plan: 1-sepsis secondary to diverticulitis of large intestine with perforation  -Sepsis was present on admission; no organ dysfunction associated. -After fluid resuscitation heart rate stable -Denied having pain and low-grade temperature; WBC is trending down now. -Continue IV antibiotics, conservative management and close monitoring. -Clear liquid diet -Follow general surgery recommendations. -Plan is for repeat CT abdomen on Monday (if patient continue experiencing worsening symptoms); by then most likely if there is a component of buildup abscess, IR will be able to drain it.  2-benign essential HTN -Blood pressure soft and stable -Continue holding antihypertensive regimen currently. -As needed hydralazine available if needed.  3-history of type 2 diabetes -Continue holding  oral hypoglycemic agents -Continue sliding scale insulin.  4-history of CVA -No focal deficits -Continue aspirin for secondary prevention -Continue risk factor modification.  5-hyperlipidemia -Continue statins.  6-tobacco abuse -Cessation counseling provided -Continue nicotine patch.  7-anxiety -Continue as needed low-dose of xanax.     DVT prophylaxis: Heparin Code Status: Full code Family Communication: No family at bedside. Disposition:   Status is: Inpatient  Dispo: The patient is from: Home              Anticipated d/c is to: Home              Anticipated d/c date is: Hopefully by 1/44/31, if no complications.              Patient currently no medically stable to be discharged; still having abdominal pain, experiencing low-grade temperature and with worsening WBCs.  Continue IV antibiotics, supportive care and clear liquid diet.  Follow general surgery recommendations.  Plan to continue conservative management and repeat CT tomorrow (12/15/2019).         Consultants:   General surgery   Procedures:  See below for x-ray reports.   Antimicrobials:  Zosyn (12/13/19)   Subjective: Low-grade temperature overnight; continue having abdominal pain.  No feeling good in general.  Tolerating liquid diet and expressing no nausea or vomiting.  WBCs trending down.  Objective: Vitals:   12/13/19 1934 12/13/19 2125 12/14/19 0603 12/14/19 0806  BP:  124/63 126/67   Pulse:  91 92   Resp:  18 18   Temp:  99.7 F (37.6 C) 98.6 F (37 C)   TempSrc:  Oral Oral   SpO2: 92% 93% 96% 94%  Weight:      Height:        Intake/Output Summary (  Last 24 hours) at 12/14/2019 1215 Last data filed at 12/14/2019 0646 Gross per 24 hour  Intake 1508.67 ml  Output 1450 ml  Net 58.67 ml   Filed Weights   12/12/19 1231  Weight: 59 kg    Examination: General exam: Alert, awake, oriented x 3; reports still ongoing abdominal discomfort and had a low-grade temperature overnight.   No nausea, no vomiting reports that she is tolerating liquid diet. Respiratory system: Clear to auscultation. Respiratory effort normal. Cardiovascular system:RRR. No murmurs, rubs, gallops. Gastrointestinal system: Abdomen is nondistended, soft and with positive bowel sounds.  Left lower quadrant tenderness to palpation appreciated on examination..  Central nervous system: Alert and oriented. No focal neurological deficits. Extremities: No cyanosis or clubbing. Skin: No rashes, no petechiae. Psychiatry: Judgement and insight appear normal. Mood & affect appropriate.    Data Reviewed: I have personally reviewed following labs and imaging studies  CBC: Recent Labs  Lab 12/12/19 1249 12/13/19 0735 12/14/19 0640  WBC 18.7* 22.1* 12.9*  HGB 14.7 11.8* 11.9*  HCT 46.2* 37.6 37.6  MCV 93.3 93.8 94.0  PLT 215 157 148*    Basic Metabolic Panel: Recent Labs  Lab 12/12/19 1249 12/13/19 0735 12/14/19 0640  NA 134* 134* 133*  K 4.0 3.7 4.0  CL 96* 101 104  CO2 27 25 23   GLUCOSE 136* 135* 133*  BUN 8 11 9   CREATININE 0.64 0.64 0.55  CALCIUM 8.9 7.6* 7.6*  MG 1.7  --  2.0    GFR: Estimated Creatinine Clearance: 50.5 mL/min (by C-G formula based on SCr of 0.55 mg/dL).  Liver Function Tests: Recent Labs  Lab 12/12/19 1249  AST 17  ALT 13  ALKPHOS 55  BILITOT 0.8  PROT 8.1  ALBUMIN 4.2    CBG: Recent Labs  Lab 12/13/19 1125 12/13/19 1625 12/13/19 2114 12/14/19 0732 12/14/19 1131  GLUCAP 145* 125* 122* 123* 146*     Recent Results (from the past 240 hour(s))  Culture, blood (routine x 2)     Status: None (Preliminary result)   Collection Time: 12/12/19  1:20 PM   Specimen: Right Antecubital; Blood  Result Value Ref Range Status   Specimen Description   Final    RIGHT ANTECUBITAL BOTTLES DRAWN AEROBIC AND ANAEROBIC   Special Requests Blood Culture adequate volume  Final   Culture   Final    NO GROWTH < 24 HOURS Performed at [facility], [address], [place], [place] [address]    Report Status PENDING  Incomplete  Culture, blood (routine x 2)     Status: None (Preliminary result)   Collection Time: 12/12/19  1:26 PM   Specimen: BLOOD RIGHT HAND  Result Value Ref Range Status   Specimen Description   Final    BLOOD RIGHT HAND BOTTLES DRAWN AEROBIC AND ANAEROBIC   Special Requests   Final    Blood Culture results may not be optimal due to an inadequate volume of blood received in culture bottles   Culture   Final    NO GROWTH < 24 HOURS Performed at [facility], [address], [place], [place] [address]    Report Status PENDING  Incomplete  SARS Coronavirus 2 by RT PCR (hospital order, performed in [facility] hospital lab) Nasopharyngeal Nasopharyngeal Swab     Status: None   Collection Time: 12/12/19  1:55 PM   Specimen: Nasopharyngeal Swab  Result Value Ref Range Status   SARS Coronavirus 2 NEGATIVE NEGATIVE Final    Comment: (  NOTE) SARS-CoV-2 target nucleic acids are NOT DETECTED. The SARS-CoV-2 RNA is generally detectable in upper and lower respiratory specimens during the acute phase of infection. The lowest concentration of SARS-CoV-2 viral copies this assay can detect is 250 copies / mL. A negative result does not preclude SARS-CoV-2 infection and should not be used as the sole basis for treatment or other patient management decisions.  A negative result may occur with improper specimen collection / handling, submission of specimen other than nasopharyngeal swab, presence of viral mutation(s) within the areas targeted by this assay, and inadequate number of viral copies (<250 copies / mL). A negative result must be combined with clinical observations, patient history, and epidemiological information. Fact Sheet for Patients:   StrictlyIdeas.no Fact Sheet for Healthcare Providers: BankingDealers.co.za This test is not yet approved or cleared  by the [place] FDA  and has been authorized for detection and/or diagnosis of SARS-CoV-2 by FDA under an Emergency Use Authorization (EUA).  This EUA will remain in effect (meaning this test can be used) for the duration of the COVID-19 declaration under Section 564(b)(1) of the Act, 21 U.S.C. section 360bbb-3(b)(1), unless the authorization is terminated or revoked sooner. Performed at [facility], [address], [place], [place] [address]      Radiology Studies: CT ABDOMEN PELVIS W CONTRAST  Result Date: 12/12/2019 CLINICAL DATA:  Lower abdominal pain since last night, multiple bouts of diarrhea, history of breast cancer, diverticulitis, appendectomy, hysterectomy EXAM: CT ABDOMEN AND PELVIS WITH CONTRAST TECHNIQUE: Multidetector CT imaging of the abdomen and pelvis was performed using the standard protocol following bolus administration of intravenous contrast. CONTRAST:  131mL OMNIPAQUE IOHEXOL 300 MG/ML  SOLN COMPARISON:  CT abdomen pelvis 04/29/2017 FINDINGS: Lower chest: Extensive subpleural reticular changes throughout the lung bases likely reflecting some chronic interstitial lung disease, previously characterized as likely UIP. Overall extent of disease in the visible bases is similar to minimally increased. Normal heart size. No pericardial effusion. Coronary atherosclerosis. Mitral annular calcification. Hepatobiliary: No focal liver abnormality is seen. No gallstones, gallbladder wall thickening, or biliary dilatation. Pancreas: Unremarkable. No pancreatic ductal dilatation or surrounding inflammatory changes. Spleen: Normal in size without focal abnormality. Adrenals/Urinary Tract: Normal adrenal glands continued stability of a retroperitoneal lesion adjacent the right renal fossa. Kidneys enhance and excrete symmetrically. Few simple appearing renal cysts. No concerning renal lesions. No urolithiasis or hydronephrosis. Normal bladder. Suspect pelvic floor laxity with cystocele. Stomach/Bowel: Small  sliding-type hiatal hernia. Esophagus, stomach and duodenum are otherwise unremarkable. Mild thickening at the terminal ileum with more pronounced circumferential thickening and phlegmonous change centered upon the cecum as well as a an outpouching along the anterior margin which appears to reflect a contained volume of air and fluid with extensive surrounding inflammatory phlegmonous change as well as some associated reactive thickening of the slightly redundant proximal transverse colon which courses in the immediate vicinity. This may arise from right-sided diverticula seen in this vicinity on comparison CT more distal colonic segments are free of thickening or dilatation. Scattered colonic diverticula throughout much of the distal colon are unremarkable. Vascular/Lymphatic: Atherosclerotic plaque within aorta and branch vessels. Focal fusiform ectasia of the infrarenal abdominal aorta up to 2 cm. No other aneurysm or ectasia. Reactive adenopathy in the abdomen. No pathologically enlarged abdominopelvic nodes. Reproductive: Uterus is surgically absent. No concerning adnexal lesions. Other: Contained air and fluid about the cecum as above with surrounding phlegmon. No free air or fluid in the abdomen or pelvis. No bowel containing hernias. Musculoskeletal: Stable  superior endplate deformity X54 with superimposed Schmorl's node. Stepwise retrolisthesis L2-L5 is unchanged from prior. Multilevel degenerative changes are present in the imaged portions of the spine. Additional degenerative changes in the SI joints and hips. No acute osseous abnormality or suspicious osseous lesion. IMPRESSION: 1. Contained volume of air and fluid about the cecum with extensive surrounding inflammatory phlegmonous change as well as some associated reactive thickening of the slightly redundant proximal transverse colon which courses in the immediate vicinity. Findings are favored to represent a perforated diverticulitis with contained  collection/abscess. 2. Extensive subpleural reticular changes throughout the lung bases likely reflecting some chronic interstitial lung disease, previously characterized as likely UIP. Overall extent of disease in the visible bases is similar to minimally increased. 3. Small sliding-type hiatal hernia. 4. Suspect pelvic floor laxity with cystocele. 5. Stable remote compression deformity T11 6. Aortic Atherosclerosis (ICD10-I70.0). These results were called by telephone at the time of interpretation on 12/12/2019 at 3:29 pm to provider JOSHUA LONG , who verbally acknowledged these results. Electronically Signed   By: Lovena Le M.D.   On: 12/12/2019 15:29   DG Chest Portable 1 View  Result Date: 12/12/2019 CLINICAL DATA:  Upper abdominal pain, fever EXAM: PORTABLE CHEST 1 VIEW COMPARISON:  03/22/2016 FINDINGS: Diffuse coarsened interstitial prominence throughout the lungs is similar prior study compatible with chronic lung disease/fibrosis. No acute confluent airspace opacity or effusion. Heart is normal size. Aortic atherosclerosis. No acute bony abnormality. IMPRESSION: Chronic lung disease/fibrosis.  No acute cardiopulmonary disease. Electronically Signed   By: Rolm Baptise M.D.   On: 12/12/2019 13:32    Scheduled Meds: . aspirin  325 mg Oral Daily  . feeding supplement (GLUCERNA SHAKE)  237 mL Oral BID BM  . heparin  5,000 Units Subcutaneous Q8H  . insulin aspart  0-5 Units Subcutaneous QHS  . insulin aspart  0-9 Units Subcutaneous TID WC  . mometasone-formoterol  2 puff Inhalation BID  . morphine  4 mg Intravenous Once  . pantoprazole  40 mg Oral Daily  . pravastatin  10 mg Oral q1800   Continuous Infusions: . sodium chloride 75 mL/hr at 12/14/19 0619  . piperacillin-tazobactam (ZOSYN)  IV 3.375 g (12/14/19 0086)  . sodium chloride       LOS: 2 days    Time spent: 35 minutes.    Barton Dubois, MD [facility] [phone]   To contact the attending provider between 7A-7P  or the covering provider during after hours 7P-7A, please log into the web site [email or web address] and access using universal [place] password for that web site. If you do not have the password, please call the hospital operator.  12/14/2019, 12:15 PM

## 2019-12-15 ENCOUNTER — Inpatient Hospital Stay (HOSPITAL_COMMUNITY): Payer: Medicare HMO

## 2019-12-15 LAB — CBC WITH DIFFERENTIAL/PLATELET
Abs Immature Granulocytes: 0.05 10*3/uL (ref 0.00–0.07)
Basophils Absolute: 0 10*3/uL (ref 0.0–0.1)
Basophils Relative: 0 %
Eosinophils Absolute: 0 10*3/uL (ref 0.0–0.5)
Eosinophils Relative: 1 %
HCT: 39.8 % (ref 36.0–46.0)
Hemoglobin: 12.6 g/dL (ref 12.0–15.0)
Immature Granulocytes: 1 %
Lymphocytes Relative: 8 %
Lymphs Abs: 0.7 10*3/uL (ref 0.7–4.0)
MCH: 29.7 pg (ref 26.0–34.0)
MCHC: 31.7 g/dL (ref 30.0–36.0)
MCV: 93.9 fL (ref 80.0–100.0)
Monocytes Absolute: 0.4 10*3/uL (ref 0.1–1.0)
Monocytes Relative: 5 %
Neutro Abs: 7.1 10*3/uL (ref 1.7–7.7)
Neutrophils Relative %: 85 %
Platelets: 155 10*3/uL (ref 150–400)
RBC: 4.24 MIL/uL (ref 3.87–5.11)
RDW: 16.2 % — ABNORMAL HIGH (ref 11.5–15.5)
WBC: 8.4 10*3/uL (ref 4.0–10.5)
nRBC: 0 % (ref 0.0–0.2)

## 2019-12-15 LAB — GLUCOSE, CAPILLARY
Glucose-Capillary: 110 mg/dL — ABNORMAL HIGH (ref 70–99)
Glucose-Capillary: 112 mg/dL — ABNORMAL HIGH (ref 70–99)
Glucose-Capillary: 110 mg/dL — ABNORMAL HIGH (ref 70–99)
Glucose-Capillary: 119 mg/dL — ABNORMAL HIGH (ref 70–99)

## 2019-12-15 MED ORDER — ONDANSETRON HCL 4 MG/2ML IJ SOLN
4.0000 mg | Freq: Three times a day (TID) | INTRAMUSCULAR | Status: DC | PRN
  Administered 2019-12-15: 4 mg via INTRAVENOUS
  Filled 2019-12-15: qty 2

## 2019-12-15 MED ORDER — IOHEXOL 300 MG/ML  SOLN
75.0000 mL | Freq: Once | INTRAMUSCULAR | Status: AC | PRN
  Administered 2019-12-15: 75 mL via INTRAVENOUS

## 2019-12-15 MED ORDER — IOHEXOL 9 MG/ML PO SOLN
ORAL | Status: AC
  Filled 2019-12-15: qty 1000

## 2019-12-15 MED ORDER — QUETIAPINE FUMARATE 25 MG PO TABS
12.5000 mg | ORAL_TABLET | Freq: Every day | ORAL | Status: DC
  Administered 2019-12-15 – 2019-12-18 (×4): 12.5 mg via ORAL
  Filled 2019-12-15 (×4): qty 1

## 2019-12-15 NOTE — Progress Notes (Signed)
PROGRESS NOTE    [name]  EVO:[id number] DOB: Jun 14, 1941 DOA: 12/12/2019 PCP: Rosita Fire, MD   Chief Complaint  Patient presents with  . Diarrhea    Brief Narrative:  79 y.o. female, has medical history of hypertension, hyperlipidemia, diabetes mellitus, tobacco abuse, patient presents to ED secondary to complaints of abdominal pain, reports pain started 6:00 am this morning, appears to be sudden onset, as well she reports multiple episodes of nonbloody diarrhea, reported some chills at home, and found to be febrile in ED, reports some fatigue, and expressed abdominal pain to be constant, nonradiating, she did not have such pain in the past, does have history of diverticulitis, but not experiencing such pain before, she denies dysuria, polyuria, cough, congestion or COVID-19 exposure. - in ED patient was noted Abrol 101, blood pressure on the lower side, she had leukocytosis when, elevated lactic acid at 2.4, CT abdomen pelvis significant for cecal diverticulitis with early perforation, patient was started on IV Zosyn, general surgery was consulted and [facility] hospitalist consulted to admit.  Assessment & Plan: 1-sepsis secondary to diverticulitis of large intestine with perforation  -Sepsis was present on admission; no organ dysfunction associated. -After fluid resuscitation heart rate stable, patient no longer afebrile and WBC is now within normal limits. -She continues present abdominal discomfort; repeat CT scan demonstrated stability and no need for surgical intervention or abscess drainage. -Following surgical service recommendations will advance diet and continue IV antibiotics for another 24 hours. -Continue as needed analgesics.  2-benign essential HTN -Blood pressure soft and stable -Continue holding antihypertensive regimen currently. -continue As needed hydralazine available if needed.  3-history of type 2 diabetes -Continue holding oral hypoglycemic agents -Continue  sliding scale insulin.  4-history of CVA -No focal deficits -Continue aspirin for secondary prevention -Continue risk factor modifications.  5-hyperlipidemia -Continue statins.  6-tobacco abuse -Cessation counseling provided -Continue nicotine patch.  7-anxiety/high risk for hospital-acquired delirium -Continue as needed low-dose of xanax. -Nightly low-dose Seroquel will be initiated.  8-physical deconditioning -PT has evaluated patient and recommended skilled nursing facility at discharge.     DVT prophylaxis: Heparin Code Status: Full code Family Communication: No family at bedside. Disposition:   Status is: Inpatient  Dispo: The patient is from: Home              Anticipated d/c is to: Home              Anticipated d/c date is: Hopefully by 2/99/37, if no complications.              Patient currently feeling weak and deconditioned; still having abdominal pain and a high risk for hospital-acquired delirium.  She is afebrile and WBCs within normal limits now.  CT scan stable and demonstrating no need for drainable abscess or surgical intervention.  After discussing with general surgery we will plan to further advance diet and if tolerated transition IV antibiotics to oral regimen with intention to complete a total of 14 days.  Physical therapy has evaluated patient and is recommending a skilled nursing facility for further care and rehab.        Consultants:   General surgery   Procedures:  See below for x-ray reports.   Antimicrobials:  Zosyn (12/13/19)   Subjective: No further fever, WBC is now within normal limits.  Still having some abdominal discomfort but expressed no nausea or vomiting.  Repeat CT scan still demonstrating no significant amount of fluid collection to be drained and inflammatory changes suggesting  diverticulitis.  Objective: Vitals:   12/14/19 2103 12/15/19 0533 12/15/19 0812 12/15/19 1320  BP: (!) 144/65 133/67  127/63  Pulse: 83 86   84  Resp: 18 18  17   Temp: 98.6 F (37 C) (!) 97.5 F (36.4 C)  97.9 F (36.6 C)  TempSrc: Oral Oral  Oral  SpO2: 97% 97% 95% 96%  Weight:      Height:        Intake/Output Summary (Last 24 hours) at 12/15/2019 1654 Last data filed at 12/15/2019 1300 Gross per 24 hour  Intake 480 ml  Output 900 ml  Net -420 ml   Filed Weights   12/12/19 1231  Weight: 59 kg    Examination: General exam: Alert, awake, oriented x 2 currently; apparently patient with episode of transient confusion earlier today.  No further episode of fever, no nausea, no vomiting.  Still having abdominal discomfort (but improved). Respiratory system: Clear to auscultation. Respiratory effort normal. Cardiovascular system:RRR. No murmurs, rubs, gallops. Gastrointestinal system: Abdomen is nondistended, soft and no guarding appreciated.  Patient reports tenderness to deep palpation.  Positive bowel sounds appreciated.  Ontender. No organomegaly or masses felt.  Central nervous system: Alert and oriented. No focal neurological deficits. Extremities: No cyanosis or clubbing. Skin: No rashes, no petechiae. Psychiatry: Mood & affect appropriate.    Data Reviewed: I have personally reviewed following labs and imaging studies  CBC: Recent Labs  Lab 12/12/19 1249 12/13/19 0735 12/14/19 0640 12/15/19 0606  WBC 18.7* 22.1* 12.9* 8.4  NEUTROABS  --   --   --  7.1  HGB 14.7 11.8* 11.9* 12.6  HCT 46.2* 37.6 37.6 39.8  MCV 93.3 93.8 94.0 93.9  PLT 215 157 148* 585    Basic Metabolic Panel: Recent Labs  Lab 12/12/19 1249 12/13/19 0735 12/14/19 0640  NA 134* 134* 133*  K 4.0 3.7 4.0  CL 96* 101 104  CO2 27 25 23   GLUCOSE 136* 135* 133*  BUN 8 11 9   CREATININE 0.64 0.64 0.55  CALCIUM 8.9 7.6* 7.6*  MG 1.7  --  2.0    GFR: Estimated Creatinine Clearance: 50.5 mL/min (by C-G formula based on SCr of 0.55 mg/dL).  Liver Function Tests: Recent Labs  Lab 12/12/19 1249  AST 17  ALT 13  ALKPHOS 55    BILITOT 0.8  PROT 8.1  ALBUMIN 4.2    CBG: Recent Labs  Lab 12/14/19 1637 12/14/19 2111 12/15/19 0731 12/15/19 1056 12/15/19 1559  GLUCAP 128* 112* 110* 112* 110*     Recent Results (from the past 240 hour(s))  Culture, blood (routine x 2)     Status: None (Preliminary result)   Collection Time: 12/12/19  1:20 PM   Specimen: Right Antecubital; Blood  Result Value Ref Range Status   Specimen Description   Final    RIGHT ANTECUBITAL BOTTLES DRAWN AEROBIC AND ANAEROBIC   Special Requests Blood Culture adequate volume  Final   Culture   Final    NO GROWTH 3 DAYS Performed at [facility], [address], [place], [place] [address]    Report Status PENDING  Incomplete  Culture, blood (routine x 2)     Status: None (Preliminary result)   Collection Time: 12/12/19  1:26 PM   Specimen: BLOOD RIGHT HAND  Result Value Ref Range Status   Specimen Description   Final    BLOOD RIGHT HAND BOTTLES DRAWN AEROBIC AND ANAEROBIC   Special Requests   Final    Blood Culture  results may not be optimal due to an inadequate volume of blood received in culture bottles   Culture   Final    NO GROWTH 3 DAYS Performed at [facility], [address] [place], [place] [address]    Report Status PENDING  Incomplete  SARS Coronavirus 2 by RT PCR (hospital order, performed in [facility] hospital lab) Nasopharyngeal Nasopharyngeal Swab     Status: None   Collection Time: 12/12/19  1:55 PM   Specimen: Nasopharyngeal Swab  Result Value Ref Range Status   SARS Coronavirus 2 NEGATIVE NEGATIVE Final    Comment: (NOTE) SARS-CoV-2 target nucleic acids are NOT DETECTED. The SARS-CoV-2 RNA is generally detectable in upper and lower respiratory specimens during the acute phase of infection. The lowest concentration of SARS-CoV-2 viral copies this assay can detect is 250 copies / mL. A negative result does not preclude SARS-CoV-2 infection and should not be used as the sole basis for treatment or  other patient management decisions.  A negative result may occur with improper specimen collection / handling, submission of specimen other than nasopharyngeal swab, presence of viral mutation(s) within the areas targeted by this assay, and inadequate number of viral copies (<250 copies / mL). A negative result must be combined with clinical observations, patient history, and epidemiological information. Fact Sheet for Patients:   StrictlyIdeas.no Fact Sheet for Healthcare Providers: BankingDealers.co.za This test is not yet approved or cleared  by the [place] FDA and has been authorized for detection and/or diagnosis of SARS-CoV-2 by FDA under an Emergency Use Authorization (EUA).  This EUA will remain in effect (meaning this test can be used) for the duration of the COVID-19 declaration under Section 564(b)(1) of the Act, 21 U.S.C. section 360bbb-3(b)(1), unless the authorization is terminated or revoked sooner. Performed at [facility], [address], [place], [place] [address]      Radiology Studies: CT ABDOMEN PELVIS W CONTRAST  Result Date: 12/15/2019 CLINICAL DATA:  Follow-up for diverticulitis. EXAM: CT ABDOMEN AND PELVIS WITH CONTRAST TECHNIQUE: Multidetector CT imaging of the abdomen and pelvis was performed using the standard protocol following bolus administration of intravenous contrast. CONTRAST:  59mL OMNIPAQUE IOHEXOL 300 MG/ML  SOLN COMPARISON:  12/12/2019 FINDINGS: Lower chest: Lung base fibrosis, stable.  No acute findings. Hepatobiliary: No focal liver abnormality is seen. No gallstones, gallbladder wall thickening, or biliary dilatation. Pancreas: Unremarkable. No pancreatic ductal dilatation or surrounding inflammatory changes. Spleen: Normal in size without focal abnormality. Adrenals/Urinary Tract: No adrenal mass. Kidneys normal in size, orientation and position with symmetric enhancement and excretion. 1.8 cm  upper pole left renal cyst. 7 mm anterior midpole right renal cyst. No other masses, no stones and no hydronephrosis. Normal ureters. Normal bladder. Fat containing area in the perinephric fat adjacent to the superolateral right kidney, likely an area of fat necrosis, unchanged. Stomach/Bowel: Poorly defined masslike abnormality contiguous with the anterior wall of the cecum, consistent with a poorly defined abscess or phlegmon, currently measuring 3.1 x 2.9 x 3.7 cm, increased from 2.6 x 2.3 x 3.2 cm on the prior exam. There is thickening of the wall of the cecum which is similar to the prior study. The right transverse colon abuts the area of inflammation arising from the anterior cecum. There are numerous colonic diverticula which predominate on the left. No other evidence of diverticulitis. No other areas of wall thickening or inflammation. Stomach and small bowel are unremarkable. Vascular/Lymphatic: Aortic atherosclerosis. No enlarged lymph nodes. Reproductive: Status post hysterectomy. No adnexal  masses. Other: No ascites. There is subcutaneous soft tissue air the lower, new from the prior exam. Anterior abdominal wall Musculoskeletal: No acute fracture. No osteoblastic or osteolytic lesions. IMPRESSION: 1. The poorly defined collection or phlegmon, contiguous with the anterior wall of the cecum, has mildly increased in size, but remains poorly defined, and may not be drainable. There are surrounding inflammatory changes and more diffuse wall thickening of the cecum, which is similar to the prior study. 2. No other acute abnormality within the abdomen or pelvis. No free air. No ascites. Electronically Signed   By: Lajean Manes M.D.   On: 12/15/2019 13:27    Scheduled Meds: . aspirin  325 mg Oral Daily  . feeding supplement (GLUCERNA SHAKE)  237 mL Oral BID BM  . heparin  5,000 Units Subcutaneous Q8H  . insulin aspart  0-5 Units Subcutaneous QHS  . insulin aspart  0-9 Units Subcutaneous TID WC  .  mometasone-formoterol  2 puff Inhalation BID  . morphine  4 mg Intravenous Once  . pantoprazole  40 mg Oral Daily  . pravastatin  10 mg Oral q1800  . QUEtiapine  12.5 mg Oral QHS   Continuous Infusions: . sodium chloride 75 mL/hr at 12/15/19 1322  . piperacillin-tazobactam (ZOSYN)  IV 3.375 g (12/15/19 1324)  . sodium chloride       LOS: 3 days    Time spent: 30 minutes.    Barton Dubois, MD [facility] [phone]   To contact the attending provider between 7A-7P or the covering provider during after hours 7P-7A, please log into the web site [email or web address] and access using universal [place] password for that web site. If you do not have the password, please call the hospital operator.  12/15/2019, 4:54 PM

## 2019-12-15 NOTE — Progress Notes (Signed)
Pharmacy Antibiotic Note  [name] is a 79 y.o. female admitted on 12/12/2019 with unknown infection.  Pharmacy has been consulted for zosyn dosing. Patient's leukocytosis is improving. Still having pain. F/U CT today. Advancing diet. Blood cultures are negative to date.  Plan: Continue Zosyn 3.375g IV q8h (4 hour infusion).  F/U cxs and clinical progress Monitor V/S, labs  Height: 5\' 5"  (165.1 cm) Weight: 59 kg (130 lb) IBW/kg (Calculated) : 57  Temp (24hrs), Avg:98.1 F (36.7 C), Min:97.5 F (36.4 C), Max:98.6 F (37 C)  Recent Labs  Lab 12/12/19 1249 12/12/19 1456 12/13/19 0735 12/14/19 0640 12/15/19 0606  WBC 18.7*  --  22.1* 12.9* 8.4  CREATININE 0.64  --  0.64 0.55  --   LATICACIDVEN 2.4* 2.0*  --   --   --     Estimated Creatinine Clearance: 50.5 mL/min (by C-G formula based on SCr of 0.55 mg/dL).    Allergies  Allergen Reactions  . Pollen Extract Other (See Comments)    Runny nose, watery eyes and ears get stopped up  . Red Dye     Antimicrobials this admission: 5/14 zosyn >>   Microbiology results: 5/14 BCx: ngtd 5/14 Covid: negative  Thank you for allowing pharmacy to be a part of this patient's care.  Isac Sarna, BS [place], [place] Clinical Pharmacist Pager [phone] 12/15/2019 10:37 AM

## 2019-12-15 NOTE — Plan of Care (Signed)

## 2019-12-15 NOTE — Plan of Care (Signed)
  Problem: Acute Rehab PT Goals(only PT should resolve) Goal: Pt Will Go Supine/Side To Sit Outcome: Progressing Flowsheets (Taken 12/15/2019 1419) Pt will go Supine/Side to Sit:  with modified independence  with supervision Goal: Patient Will Transfer Sit To/From Stand Outcome: Progressing Flowsheets (Taken 12/15/2019 1419) Patient will transfer sit to/from stand:  with supervision  with min guard assist Goal: Pt Will Transfer Bed To Chair/Chair To Bed Outcome: Progressing Flowsheets (Taken 12/15/2019 1419) Pt will Transfer Bed to Chair/Chair to Bed:  min guard assist  with supervision Goal: Pt Will Ambulate Outcome: Progressing Flowsheets (Taken 12/15/2019 1419) Pt will Ambulate:  75 feet  with min guard assist  with minimal assist  with rolling walker  with cane   2:20 PM, 12/15/19 Lonell Grandchild, MPT Physical Therapist with [facility] [phone] office [phone] mobile phone

## 2019-12-15 NOTE — Progress Notes (Signed)
[facility]  Ct without organized collection. Remains about the same in size. Clinically improving except for pain. Patient should take pain meds if hurting.  Adv diet as tolerated. At dc transition to augmentin for 14 day course   Updated grandson and Dr. Dyann Kief.  Curlene Labrum, MD

## 2019-12-15 NOTE — Progress Notes (Signed)
[facility] Progress Note     Subjective: Still with pain. Having BM.   Objective: Vital signs in last 24 hours: Temp:  [97.5 F (36.4 C)-98.6 F (37 C)] 97.5 F (36.4 C) (05/17 0533) Pulse Rate:  [83-86] 86 (05/17 0533) Resp:  [18] 18 (05/17 0533) BP: (133-144)/(65-67) 133/67 (05/17 0533) SpO2:  [95 %-97 %] 95 % (05/17 0812) Last BM Date: 12/15/19  Intake/Output from previous day: 05/16 0701 - 05/17 0700 In: 856.5 [P.O.:840; I.V.:16.5] Out: 1900 [Urine:1900] Intake/Output this shift: Total I/O In: 240 [P.O.:240] Out: -   General appearance: alert, cooperative and no distress Resp: normal work of breathing GI: soft, nondistended, tender in the RLQ  Lab Results:  Recent Labs    12/14/19 0640 12/15/19 0606  WBC 12.9* 8.4  HGB 11.9* 12.6  HCT 37.6 39.8  PLT 148* 155   BMET Recent Labs    12/13/19 0735 12/14/19 0640  NA 134* 133*  K 3.7 4.0  CL 101 104  CO2 25 23  GLUCOSE 135* 133*  BUN 11 9  CREATININE 0.64 0.55  CALCIUM 7.6* 7.6*    Anti-infectives: Anti-infectives (From admission, onward)   Start     Dose/Rate Route Frequency Ordered Stop   12/12/19 2000  piperacillin-tazobactam (ZOSYN) IVPB 3.375 g     3.375 g 12.5 mL/hr over 240 Minutes Intravenous Every 8 hours 12/12/19 1558     12/12/19 1330  piperacillin-tazobactam (ZOSYN) IVPB 3.375 g     3.375 g 100 mL/hr over 30 Minutes Intravenous  Once 12/12/19 1326 12/12/19 1500      Assessment/Plan: [name] is a 79 yo who has perforated cecal cancer with unorganized collection. Her fever curve is improved and her leukocytosis is improved but still with pain.  -CT a/p repeated to see if collection is organized  -IV antibiotics -Diet as tolerated -Will see what CT shows and decide about IR drainage or not  -Delirium precautions    LOS: 3 days    Virl Cagey 12/15/2019

## 2019-12-15 NOTE — Care Management Important Message (Signed)
Important Message  Patient Details  Name: [name] MRN: [id number] Date of Birth: [birth year]   Medicare Important Message Given:  Yes     Tommy Medal 12/15/2019, 11:51 AM

## 2019-12-15 NOTE — TOC Initial Note (Signed)
Transition of Care [facility]) - Initial/Assessment Note    Patient Details  Name: [name] MRN: [id number] Date of Birth: 12-Oct-1940  Transition of Care [facility]) CM/SW Contact:    Shade Flood, LCSW Phone Number: 12/15/2019, 1:40 PM  Clinical Narrative:                  Pt admitted from her mobile home where she lives with her son. PT recommending SNF rehab. Met with pt to assess. Per pt, she mostly uses the furniture to help her ambulate around the mobile home but that she does have a walker to use for days her "head doesn't feel right". Pt states she is agreeable to SNF if needed but if she improves enough to go home with [facility] she will change to that plan. Per pt, her disabled son lives with her and they help each other out with their physical needs at home. Pt states that she prefers to go home because she is worried about him but she does feel that she would benefit from SNF rehab. Pt requests to be referred out to SNFs in [place] as she lives in [place]. Will refer out today. [facility] and they do not manage pt's [employer] Medicare so the selected SNF will need to start insurance auth.  TOC will follow and continue to assist with dc planning.  Expected Discharge Plan: Skilled Nursing Facility Barriers to Discharge: Continued Medical Work up   Patient Goals and CMS Choice Patient states their goals for this hospitalization and ongoing recovery are:: go home if possible CMS Medicare.gov Compare Post Acute Care list provided to:: Patient Choice offered to / list presented to : Patient  Expected Discharge Plan and Services Expected Discharge Plan: [place] In-house Referral: Clinical Social Work   Post Acute Care Choice: [place] Living arrangements for the past 2 months: Mobile Home                                      Prior Living Arrangements/Services Living arrangements for the past 2 months: Mobile Home Lives with:: Adult  Children Patient language and need for interpreter reviewed:: Yes Do you feel safe going back to the place where you live?: Yes      Need for Family Participation in Patient Care: Yes (Comment) Care giver support system in place?: Yes (comment) Current home services: DME Criminal Activity/Legal Involvement Pertinent to Current Situation/Hospitalization: No - Comment as needed  Activities of Daily Living Home Assistive Devices/Equipment: Cane (specify quad or straight) ADL Screening (condition at time of admission) Patient's cognitive ability adequate to safely complete daily activities?: Yes Is the patient deaf or have difficulty hearing?: No Does the patient have difficulty seeing, even when wearing glasses/contacts?: No Does the patient have difficulty concentrating, remembering, or making decisions?: No Patient able to express need for assistance with ADLs?: Yes Does the patient have difficulty dressing or bathing?: Yes Independently performs ADLs?: No Communication: Independent Dressing (OT): Needs assistance Is this a change from baseline?: Pre-admission baseline Grooming: Needs assistance Is this a change from baseline?: Pre-admission baseline Feeding: Independent Bathing: Needs assistance Is this a change from baseline?: Pre-admission baseline Toileting: Needs assistance Is this a change from baseline?: Pre-admission baseline In/Out Bed: Needs assistance Is this a change from baseline?: Pre-admission baseline Walks in Home: Needs assistance Is this a change from baseline?: Pre-admission baseline Does the patient have difficulty  walking or climbing stairs?: Yes Weakness of Legs: Both Weakness of Arms/Hands: Right  Permission Sought/Granted Permission sought to share information with : Chartered certified accountant granted to share information with : Yes, Verbal Permission Granted     Permission granted to share info w AGENCY: [place] SNFs         Emotional Assessment Appearance:: Appears stated age Attitude/Demeanor/Rapport: Engaged Affect (typically observed): Pleasant Orientation: : Oriented to Self, Oriented to Place, Oriented to  Time, Oriented to Situation Alcohol / Substance Use: Not Applicable Psych Involvement: No (comment)  Admission diagnosis:  Sepsis ([place]) [A41.9] Diverticulitis of large intestine with perforation without bleeding [K57.20] Patient Active Problem List   Diagnosis Date Noted  . Sepsis ([place]) 12/12/2019  . Diverticulitis of large intestine with perforation without bleeding   . Diarrhea 10/01/2019  . IDA (iron deficiency anemia) 03/27/2019  . Hemorrhoids 03/27/2019  . Rectal bleeding 03/27/2019  . Osteoporosis 02/26/2019  . Special screening for malignant neoplasms, colon   . Pelvic relaxation due to enterocele, vaginal 10/31/2017  . Stroke (cerebrum) ([place]) 03/22/2016  . Tobacco use disorder 03/22/2016  . Type 2 diabetes mellitus with hyperlipidemia ([place]) 03/22/2016  . Benign essential HTN 03/22/2016  . Hyperlipidemia 03/22/2016  . Acute left-sided weakness 03/22/2016  . Osteopenia 01/25/2014  . Interstitial lung disease ([place]) 09/13/2011  . Embolism - blood clot   . Infiltrating ductal carcinoma of breast ([place]) 01/16/2011  . Tendonitis   . DEQUERVAIN'S 08/10/2010   PCP:  Rosita Fire, MD Pharmacy:   [place], [place] - [address] [place] [address] Phone: [phone] Fax: [phone]  [facility] - [place], [place] [address] Phone: [phone] Fax: [phone]     Social Determinants of Health (SDOH) Interventions    Readmission Risk Interventions Readmission Risk Prevention Plan 12/15/2019  Transportation Screening Complete  PCP or Specialist Appt within 5-7 Days Complete  Medication Review (RN CM) Complete  Some recent data might be hidden

## 2019-12-15 NOTE — NC FL2 (Signed)
[place] LEVEL OF CARE SCREENING TOOL     IDENTIFICATION  Patient Name: [name] Birthdate: 04-10-1941 Sex: female Admission Date (Current Location): 12/12/2019  [facility] and [place] Number:  [employer] and Address:  [place] [address], [place]      Provider Number: [id number]  Attending Physician Name and Address:  Barton Dubois, MD  Relative Name and Phone Number:       Current Level of Care: Hospital Recommended Level of Care: [place] Prior Approval Number:    Date Approved/Denied:   PASRR Number:    Discharge Plan: SNF    Current Diagnoses: Patient Active Problem List   Diagnosis Date Noted  . Sepsis ([place]) 12/12/2019  . Diverticulitis of large intestine with perforation without bleeding   . Diarrhea 10/01/2019  . IDA (iron deficiency anemia) 03/27/2019  . Hemorrhoids 03/27/2019  . Rectal bleeding 03/27/2019  . Osteoporosis 02/26/2019  . Special screening for malignant neoplasms, colon   . Pelvic relaxation due to enterocele, vaginal 10/31/2017  . Stroke (cerebrum) ([place]) 03/22/2016  . Tobacco use disorder 03/22/2016  . Type 2 diabetes mellitus with hyperlipidemia ([place]) 03/22/2016  . Benign essential HTN 03/22/2016  . Hyperlipidemia 03/22/2016  . Acute left-sided weakness 03/22/2016  . Osteopenia 01/25/2014  . Interstitial lung disease ([place]) 09/13/2011  . Embolism - blood clot   . Infiltrating ductal carcinoma of breast ([place]) 01/16/2011  . Tendonitis   . DEQUERVAIN'S 08/10/2010    Orientation RESPIRATION BLADDER Height & Weight     Self, Time, Situation, Place  Normal Continent Weight: 130 lb (59 kg) Height:  5\' 5"  (165.1 cm)  BEHAVIORAL SYMPTOMS/MOOD NEUROLOGICAL BOWEL NUTRITION STATUS      Continent Diet(see dc summary)  AMBULATORY STATUS COMMUNICATION OF NEEDS Skin   Extensive Assist Verbally Normal                       Personal Care Assistance Level of  Assistance  Bathing, Feeding, Dressing Bathing Assistance: Limited assistance Feeding assistance: Independent Dressing Assistance: Limited assistance     Functional Limitations Info  Sight, Hearing, Speech Sight Info: Adequate Hearing Info: Adequate Speech Info: Adequate    SPECIAL CARE FACTORS FREQUENCY  PT (By licensed PT), OT (By licensed OT)     PT Frequency: 5x week OT Frequency: 3x week            Contractures Contractures Info: Not present    Additional Factors Info  Code Status, Allergies Code Status Info: Full Allergies Info: Pollen Extract, Red Dye           Current Medications (12/15/2019):  This is the current hospital active medication list Current Facility-Administered Medications  Medication Dose Route Frequency Provider Last Rate Last Admin  . 0.9 %  sodium chloride infusion   Intravenous Continuous Elgergawy, Silver Huguenin, MD 75 mL/hr at 12/15/19 1322 New Bag at 12/15/19 1322  . acetaminophen (TYLENOL) tablet 650 mg  650 mg Oral Q6H PRN Elgergawy, Silver Huguenin, MD   650 mg at 12/13/19 0867  . ALPRAZolam Duanne Moron) tablet 0.5 mg  0.5 mg Oral Q8H PRN Barton Dubois, MD      . aspirin tablet 325 mg  325 mg Oral Daily Elgergawy, Silver Huguenin, MD   325 mg at 12/15/19 1033  . feeding supplement (GLUCERNA SHAKE) (GLUCERNA SHAKE) liquid 237 mL  237 mL Oral BID BM Barton Dubois, MD   237 mL at 12/15/19 1324  .  heparin injection 5,000 Units  5,000 Units Subcutaneous Q8H Elgergawy, Silver Huguenin, MD   5,000 Units at 12/15/19 1324  . hydrALAZINE (APRESOLINE) injection 5 mg  5 mg Intravenous Q4H PRN Elgergawy, Silver Huguenin, MD      . insulin aspart (novoLOG) injection 0-5 Units  0-5 Units Subcutaneous QHS Elgergawy, Dawood S, MD      . insulin aspart (novoLOG) injection 0-9 Units  0-9 Units Subcutaneous TID WC Elgergawy, Silver Huguenin, MD   1 Units at 12/14/19 1726  . mometasone-formoterol (DULERA) 200-5 MCG/ACT inhaler 2 puff  2 puff Inhalation BID Elgergawy, Silver Huguenin, MD   2 puff at 12/15/19  [id number]  . morphine 4 MG/ML injection 4 mg  4 mg Intravenous Once Elgergawy, Silver Huguenin, MD   Stopped at 12/12/19 1305  . oxyCODONE (Oxy IR/ROXICODONE) immediate release tablet 5 mg  5 mg Oral Q4H PRN Elgergawy, Silver Huguenin, MD   5 mg at 12/15/19 1033  . pantoprazole (PROTONIX) EC tablet 40 mg  40 mg Oral Daily Elgergawy, Silver Huguenin, MD   40 mg at 12/15/19 1033  . piperacillin-tazobactam (ZOSYN) IVPB 3.375 g  3.375 g Intravenous Q8H Elgergawy, Dawood S, MD 12.5 mL/hr at 12/15/19 1324 3.375 g at 12/15/19 1324  . pravastatin (PRAVACHOL) tablet 10 mg  10 mg Oral q1800 Elgergawy, Silver Huguenin, MD   10 mg at 12/14/19 1726  . sodium chloride 0.9 % bolus 500 mL  500 mL Intravenous Once Elgergawy, Silver Huguenin, MD         Discharge Medications: Please see discharge summary for a list of discharge medications.  Relevant Imaging Results:  Relevant Lab Results:   Additional Information SSN: [place]  Shade Flood, LCSW

## 2019-12-15 NOTE — Evaluation (Signed)
Physical Therapy Evaluation Patient Details Name: [name] MRN: [id number] DOB: [birth year] Today's Date: 12/15/2019   History of Present Illness  [name]  is a 79 y.o. female, has medical history of hypertension, hyperlipidemia, diabetes mellitus, tobacco abuse, patient presents to ED secondary to complaints of abdominal pain, reports pain started 6:00 this morning, appears to be of a sudden onset, as well she reports multiple episodes of nonbloody diarrhea, ports some chills at home, but she was febrile in ED, reports some fatigue, ports abdominal pain is constant, nonradiating, she did not have such pain in the past, does have history of diverticulitis, but not experiencing such pain before, he denies dysuria, polyuria, cough, congestion or COVID-19 exposure.- in ED patient was noted Abrol 101, blood pressure on the lower side, she had leukocytosis when, elevated lactic acid at 2.4, CT abdomen pelvis significant for cecal diverticulitis with early perforation, patient was started on IV Zosyn, general surgery were consulted and treated hospitalist consulted to admit.    Clinical Impression  Patient very unsteady on feet with near loss of balance using SPC, required the use of RW for safety, demonstrates slow labored cadence without loss of balance using RW, limited secondary to fatigue and tolerated sitting up in chair after therapy.  Patient will benefit from continued physical therapy in hospital and recommended venue below to increase strength, balance, endurance for safe ADLs and gait.    Follow Up Recommendations SNF;Supervision for mobility/OOB;Supervision - Intermittent    Equipment Recommendations  Rolling walker with 5" wheels    Recommendations for Other Services       Precautions / Restrictions Precautions Precautions: Fall Restrictions Weight Bearing Restrictions: No      Mobility  Bed Mobility Overal bed mobility: Needs Assistance Bed Mobility: Supine to Sit      Supine to sit: Supervision;Min guard     General bed mobility comments: increased time, labored movement  Transfers Overall transfer level: Needs assistance Equipment used: Rolling walker (2 wheeled);Straight cane Transfers: Sit to/from [facility] Sit to Stand: Min assist Stand pivot transfers: Min assist       General transfer comment: unsteady labored movement with near loss of balance using SPC, safer using RW  Ambulation/Gait Ambulation/Gait assistance: Min assist Gait Distance (Feet): 40 Feet Assistive device: Rolling walker (2 wheeled) Gait Pattern/deviations: Decreased step length - right;Decreased step length - left;Decreased stride length Gait velocity: decreased   General Gait Details: very unsteady with near loss of balance using SPC, safer using RW demonstrating slow labored cadence without loss of balance, limited secondary to c/o fatigue  Stairs            Wheelchair Mobility    Modified Rankin (Stroke Patients Only)       Balance Overall balance assessment: Needs assistance Sitting-balance support: Feet supported;No upper extremity supported Sitting balance-Leahy Scale: Good Sitting balance - Comments: seated at EOB   Standing balance support: During functional activity;Single extremity supported Standing balance-Leahy Scale: Poor Standing balance comment: poor using SPC, fair using RW                             Pertinent Vitals/Pain Pain Assessment: 0-10 Pain Score: 4  Pain Location: stomach Pain Descriptors / Indicators: Tender;Sore Pain Intervention(s): Limited activity within patient's tolerance;Monitored during session    [name] expects to be discharged to:: Private residence Living Arrangements: Children Available Help at Discharge: Family;Available PRN/intermittently Type of Home: Mobile home  Home Access: Stairs to enter Entrance Stairs-Rails: Right;Left;Can reach both Entrance  Stairs-Number of Steps: 4 Home Layout: One level Home Equipment: Cane - single point;Wheelchair - manual      Prior Function Level of Independence: Independent with assistive device(s)         Comments: household ambulator using SPC or leaning on walls/furniture     Hand Dominance   Dominant Hand: Right    Extremity/Trunk Assessment   Upper Extremity Assessment Upper Extremity Assessment: Generalized weakness    Lower Extremity Assessment Lower Extremity Assessment: Generalized weakness    Cervical / Trunk Assessment Cervical / Trunk Assessment: Normal  Communication   Communication: No difficulties  Cognition Arousal/Alertness: Awake/alert Behavior During Therapy: WFL for tasks assessed/performed Overall Cognitive Status: Within Functional Limits for tasks assessed                                        General Comments      Exercises     Assessment/Plan    PT Assessment Patient needs continued PT services  PT Problem List Decreased strength;Decreased activity tolerance;Decreased balance;Decreased mobility       PT Treatment Interventions Gait training;Balance training;Stair training;Functional mobility training;Therapeutic activities;Therapeutic exercise;Patient/family education    PT Goals (Current goals can be found in the Care Plan section)  Acute Rehab PT Goals Patient Stated Goal: return home able to walk safely after rehab PT Goal Formulation: With patient Time For Goal Achievement: 12/29/19 Potential to Achieve Goals: Good    Frequency Min 3X/week   Barriers to discharge        Co-evaluation               AM-PAC PT "6 Clicks" Mobility  Outcome Measure Help needed turning from your back to your side while in a flat bed without using bedrails?: None Help needed moving from lying on your back to sitting on the side of a flat bed without using bedrails?: A Little Help needed moving to and from a bed to a chair  (including a wheelchair)?: A Little Help needed standing up from a chair using your arms (e.g., wheelchair or bedside chair)?: A Lot Help needed to walk in hospital room?: A Lot Help needed climbing 3-5 steps with a railing? : A Lot 6 Click Score: 16    End of Session   Activity Tolerance: Patient tolerated treatment well;Patient limited by fatigue Patient left: in chair;with call bell/phone within reach Nurse Communication: Mobility status PT Visit Diagnosis: Unsteadiness on feet (R26.81);Other abnormalities of gait and mobility (R26.89);Muscle weakness (generalized) (M62.81)    Time: 8325-4982 PT Time Calculation (min) (ACUTE ONLY): 32 min   Charges:   PT Evaluation $PT Eval Moderate Complexity: 1 Mod PT Treatments $Therapeutic Activity: 23-37 mins        2:18 PM, 12/15/19 Lonell Grandchild, MPT Physical Therapist with [facility] [phone] office [phone] mobile phone

## 2019-12-16 LAB — GLUCOSE, CAPILLARY
Glucose-Capillary: 122 mg/dL — ABNORMAL HIGH (ref 70–99)
Glucose-Capillary: 96 mg/dL (ref 70–99)
Glucose-Capillary: 107 mg/dL — ABNORMAL HIGH (ref 70–99)
Glucose-Capillary: 121 mg/dL — ABNORMAL HIGH (ref 70–99)
Glucose-Capillary: 160 mg/dL — ABNORMAL HIGH (ref 70–99)

## 2019-12-16 MED ORDER — AMOXICILLIN-POT CLAVULANATE 875-125 MG PO TABS
1.0000 | ORAL_TABLET | Freq: Two times a day (BID) | ORAL | Status: DC
  Administered 2019-12-16 – 2019-12-19 (×7): 1 via ORAL
  Filled 2019-12-16 (×7): qty 1

## 2019-12-16 NOTE — Progress Notes (Signed)
PT reassessed patient continues to need SNF. Bed offer from [facility] in [place], [place]. Patient accepts. TOC called [facility] with Update. They will start [facility] Auth with plan to discharge there tomorrow.

## 2019-12-16 NOTE — Discharge Instructions (Signed)
Diverticulosis/ Diverticulitis Information and Diet:   Diverticulosis is a condition in which small, bulging pouches (diverticuli) form inside the lower part of the intestine, usually in the colon. Constipation and straining during bowel movements can worsen the condition. A diet rich in fiber can help keep stools soft and prevent inflammation.  Diverticulitis occurs when the pouches in the colon become infected or inflamed. Dietary changes can help the colon heal.  Fiber is an important part of the diet for patients with diverticulosis. A high-fiber diet softens and gives bulk to the stool, allowing it to pass quickly and easily.  Diet for Diverticulosis Eat a high-fiber diet when you have diverticulosis. Fiber softens the stool and helps prevent constipation. It also can help decrease pressure in the colon and help prevent flare-ups of diverticulitis.  High-fiber foods include:  Beans and legumes Bran, whole wheat bread and whole grain cereals such as oatmeal Brown and wild rice Fruits such as apples, bananas and pears Vegetables such as broccoli, carrots, corn and squash Whole wheat pasta If you currently don't have a diet high in fiber, you should add fiber gradually. This helps avoid bloating and abdominal discomfort. The target is to eat 25 to 30 grams of fiber daily. Drink at least 8 cups of fluid daily. Fluid will help soften your stool. Exercise also promotes bowel movement and helps prevent constipation.  When the colon is not inflamed, eat popcorn, nuts and seeds as tolerated.  Diet for Diverticulitis During flare ups of diverticulitis, follow a clear liquid diet. Your doctor will let you know when to progress from clear liquids to low fiber solids and then back to your normal diet.  A clear liquid diet means no solid foods. Juices should have no pulp. During the clear liquid diet, you may consume:  Broth Clear juices such as apple, cranberry and grape. (Avoid orange  juice) Jell-O Popsicles  When you're able to eat solid food, choose low fiber foods while healing. Low fiber foods include:  Canned or cooked fruit without seeds or skin, such as applesauce and melon Canned or well cooked vegetables without seeds and skin Dairy products such as cheese, milk and yogurt Eggs Low-fiber cereal Meat that is ground or tender and well cooked Pasta White bread and white rice AVOID RED MEAT WHILE YOU HAVE AN ACTIVE FLARE.  AVOID NSAIDS, IBUPROFEN, ALEVE, ASPIRIN WHILE YOU HAVE AN ACTIVE FLARE.  EXERCISE AS WELL AS SMOKING CESSATION CAN HELP PREVENT RECURRENCES.   After symptoms improve, usually within two to four days, you may add 5 to 15 grams of fiber a day back into your diet. Resume your high fiber diet when you no longer have symptoms.    Diverticulitis  Diverticulitis is when small pockets in your large intestine (colon) get infected or swollen. This causes stomach pain and watery poop (diarrhea). These pouches are called diverticula. They form in people who have a condition called diverticulosis. Follow these instructions at home: Medicines  Take over-the-counter and prescription medicines only as told by your doctor. These include: ? Antibiotics. ? Pain medicines. ? Fiber pills. ? Probiotics. ? Stool softeners.  Do not drive or use heavy machinery while taking prescription pain medicine.  If you were prescribed an antibiotic, take it as told. Do not stop taking it even if you feel better. General instructions   Follow a diet as told by your doctor.  When you feel better, your doctor may tell you to change your diet. You may need to eat   a lot of fiber. Fiber makes it easier to poop (have bowel movements). Healthy foods with fiber include: ? Berries. ? Beans. ? Lentils. ? Green vegetables.  Exercise 3 or more times a week. Aim for 30 minutes each time. Exercise enough to sweat and make your heart beat faster.  Keep all follow-up  visits as told. This is important. You may need to have an exam of the large intestine. This is called a colonoscopy. Contact a doctor if:  Your pain does not get better.  You have a hard time eating or drinking.  You are not pooping like normal. Get help right away if:  Your pain gets worse.  Your problems do not get better.  Your problems get worse very fast.  You have a fever.  You throw up (vomit) more than one time.  You have poop that is: ? Bloody. ? Black. ? Tarry. Summary  Diverticulitis is when small pockets in your large intestine (colon) get infected or swollen.  Take medicines only as told by your doctor.  Follow a diet as told by your doctor. This information is not intended to replace advice given to you by your health care provider. Make sure you discuss any questions you have with your health care provider. Document Revised: 06/29/2017 Document Reviewed: 08/03/2016 Elsevier Patient Education  2020 Elsevier Inc.  

## 2019-12-16 NOTE — Progress Notes (Signed)
[facility] Progress Note     Subjective: Continues to have some pain but did take some pain meds last night she said. Having BMs.  Objective: Vital signs in last 24 hours: Temp:  [97.9 F (36.6 C)-98 F (36.7 C)] 98 F (36.7 C) (05/18 0512) Pulse Rate:  [84-86] 84 (05/18 0512) Resp:  [17] 17 (05/17 2137) BP: (127-166)/(63-77) 131/69 (05/18 0512) SpO2:  [95 %-98 %] 95 % (05/18 0512) Last BM Date: 12/15/19  Intake/Output from previous day: 05/17 0701 - 05/18 0700 In: 480 [P.O.:480] Out: 2000 [Urine:2000] Intake/Output this shift: No intake/output data recorded.  General appearance: alert, cooperative and no distress Resp: normal work of breathing GI: soft, tender RLQ, nondistended  Lab Results:  Recent Labs    12/14/19 0640 12/15/19 0606  WBC 12.9* 8.4  HGB 11.9* 12.6  HCT 37.6 39.8  PLT 148* 155   BMET Recent Labs    12/14/19 0640  NA 133*  K 4.0  CL 104  CO2 23  GLUCOSE 133*  BUN 9  CREATININE 0.55  CALCIUM 7.6*   PT/INR No results for input(s): LABPROT, INR in the last 72 hours.  Studies/Results: CT ABDOMEN PELVIS W CONTRAST  Result Date: 12/15/2019 CLINICAL DATA:  Follow-up for diverticulitis. EXAM: CT ABDOMEN AND PELVIS WITH CONTRAST TECHNIQUE: Multidetector CT imaging of the abdomen and pelvis was performed using the standard protocol following bolus administration of intravenous contrast. CONTRAST:  40mL OMNIPAQUE IOHEXOL 300 MG/ML  SOLN COMPARISON:  12/12/2019 FINDINGS: Lower chest: Lung base fibrosis, stable.  No acute findings. Hepatobiliary: No focal liver abnormality is seen. No gallstones, gallbladder wall thickening, or biliary dilatation. Pancreas: Unremarkable. No pancreatic ductal dilatation or surrounding inflammatory changes. Spleen: Normal in size without focal abnormality. Adrenals/Urinary Tract: No adrenal mass. Kidneys normal in size, orientation and position with symmetric enhancement and excretion. 1.8 cm upper pole  left renal cyst. 7 mm anterior midpole right renal cyst. No other masses, no stones and no hydronephrosis. Normal ureters. Normal bladder. Fat containing area in the perinephric fat adjacent to the superolateral right kidney, likely an area of fat necrosis, unchanged. Stomach/Bowel: Poorly defined masslike abnormality contiguous with the anterior wall of the cecum, consistent with a poorly defined abscess or phlegmon, currently measuring 3.1 x 2.9 x 3.7 cm, increased from 2.6 x 2.3 x 3.2 cm on the prior exam. There is thickening of the wall of the cecum which is similar to the prior study. The right transverse colon abuts the area of inflammation arising from the anterior cecum. There are numerous colonic diverticula which predominate on the left. No other evidence of diverticulitis. No other areas of wall thickening or inflammation. Stomach and small bowel are unremarkable. Vascular/Lymphatic: Aortic atherosclerosis. No enlarged lymph nodes. Reproductive: Status post hysterectomy. No adnexal masses. Other: No ascites. There is subcutaneous soft tissue air the lower, new from the prior exam. Anterior abdominal wall Musculoskeletal: No acute fracture. No osteoblastic or osteolytic lesions. IMPRESSION: 1. The poorly defined collection or phlegmon, contiguous with the anterior wall of the cecum, has mildly increased in size, but remains poorly defined, and may not be drainable. There are surrounding inflammatory changes and more diffuse wall thickening of the cecum, which is similar to the prior study. 2. No other acute abnormality within the abdomen or pelvis. No free air. No ascites. Electronically Signed   By: Lajean Manes M.D.   On: 12/15/2019 13:27    Anti-infectives: Anti-infectives (From admission, onward)   Start     Dose/Rate  Route Frequency Ordered Stop   12/12/19 2000  piperacillin-tazobactam (ZOSYN) IVPB 3.375 g     3.375 g 12.5 mL/hr over 240 Minutes Intravenous Every 8 hours 12/12/19 1558      12/12/19 1330  piperacillin-tazobactam (ZOSYN) IVPB 3.375 g     3.375 g 100 mL/hr over 30 Minutes Intravenous  Once 12/12/19 1326 12/12/19 1500      Assessment/Plan: [name] is a 79 yo with perforated cecal diverticulitis that is still unorganized on repeat CT. Nothing for IR to drain. Leukocytosis and fever curve better. Soft diet, adv as tolerated Transition to outpatient Augmentin for 14 total day course Will see in office in a few weeks to follow up   LOS: 4 days    [name] 12/16/2019

## 2019-12-16 NOTE — Progress Notes (Signed)
PT Cancellation Note  Patient Details Name: [name] MRN: [id number] DOB: 09-Mar-1941   Cancelled Treatment:    Reason Eval/Treat Not Completed: Patient declined, no reason specified. Pt reports being tired and wanting to nap at this time. Will continue to follow acutely.    Talbot Grumbling PT, DPT 12/16/19, 10:56 AM

## 2019-12-16 NOTE — Plan of Care (Signed)

## 2019-12-16 NOTE — Progress Notes (Signed)
PROGRESS NOTE    [name]  UMP:[id number] DOB: 1940/10/16 DOA: 12/12/2019 PCP: Rosita Fire, MD   Chief Complaint  Patient presents with  . Diarrhea    Brief Narrative:  79 y.o. female, has medical history of hypertension, hyperlipidemia, diabetes mellitus, tobacco abuse, patient presents to ED secondary to complaints of abdominal pain, reports pain started 6:00 am this morning, appears to be sudden onset, as well she reports multiple episodes of nonbloody diarrhea, reported some chills at home, and found to be febrile in ED, reports some fatigue, and expressed abdominal pain to be constant, nonradiating, she did not have such pain in the past, does have history of diverticulitis, but not experiencing such pain before, she denies dysuria, polyuria, cough, congestion or COVID-19 exposure. - in ED patient was noted Abrol 101, blood pressure on the lower side, she had leukocytosis when, elevated lactic acid at 2.4, CT abdomen pelvis significant for cecal diverticulitis with early perforation, patient was started on IV Zosyn, general surgery was consulted and [facility] hospitalist consulted to admit.  Assessment & Plan: 1-sepsis secondary to diverticulitis of large intestine with perforation  -Sepsis features resolved. -She continues present mild abdominal discomfort; but repeat CT scan demonstrated stability and no need for surgical intervention or abscess drainage. -Transition antibiotics to oral regimen; further advance diet and increase physical activity. -No fever, no WBCs. -Continue as needed analgesics.  2-benign essential HTN -Blood pressure soft and stable -Continue holding antihypertensive regimen currently. -continue As needed hydralazine available if needed.  3-history of type 2 diabetes -Continue holding oral hypoglycemic agents -Continue sliding scale insulin.  4-history of CVA -No focal deficits -Continue aspirin for secondary prevention -Continue risk factor  modifications.  5-hyperlipidemia -Continue statins.  6-tobacco abuse -Cessation counseling provided -Continue nicotine patch.  7-anxiety/high risk for hospital-acquired delirium -Continue as needed low-dose of xanax. -Continue nightly low-dose Seroquel. -Oriented x3 currently.  8-physical deconditioning -PT has evaluated patient and recommended skilled nursing facility at discharge. -TOC has been made aware; reaching medical stability on 12/17/2019 if oral antibiotics and further diet advancement tolerated.     DVT prophylaxis: Heparin Code Status: Full code Family Communication: No family at bedside today. Disposition:   Status is: Inpatient  Dispo: The patient is from: Home              Anticipated d/c is to: Home              Anticipated d/c date is: Hopefully by 4/00/86, if no complications.              Patient continue feeling weak and deconditioned; still with mild abdominal pain; but no vomiting, no diarrhea, moving her bowels and otherwise feeling better.  No fever, normalization of her WBCs and per general surgery recommendations we will further advance diet and transition antibiotics to oral regimen.  If tolerated she will be medically stable for discharge on 12/17/2019.  Needing a skilled nursing facility for further care and conditioning.     Consultants:   General surgery   Procedures:  See below for x-ray reports.   Antimicrobials:  Zosyn (12/13/19>>>12/16/19) augmentin (12/16/19; will complete a total of 10 more days of antibiotics)   Subjective: No fever, no chest pain, no nausea, no vomiting.  Reports having bowel movement and even is still with some abdominal discomfort better controlled at this time.  Still weak and deconditioned.  Objective: Vitals:   12/15/19 1320 12/15/19 2137 12/16/19 0512 12/16/19 1342  BP: 127/63 (!) 166/77 131/69 101/71  Pulse: 84 86 84 94  Resp: 17 17  16   Temp: 97.9 F (36.6 C) 97.9 F (36.6 C) 98 F (36.7 C)     TempSrc: Oral     SpO2: 96% 98% 95% 96%  Weight:      Height:        Intake/Output Summary (Last 24 hours) at 12/16/2019 1505 Last data filed at 12/15/2019 2056 Gross per 24 hour  Intake --  Output 2000 ml  Net -2000 ml   Filed Weights   12/12/19 1231  Weight: 59 kg    Examination: General exam: Alert, awake, oriented x 3; reports to be sleepy; but oriented and in no major distress.  No further episode of fever, no nausea, no vomiting, reports having bowel movement.  Still with some abdominal discomfort, but overall better controlled. Respiratory system: Clear to auscultation. Respiratory effort normal. Cardiovascular system: No rubs, no gallops, no JVD; RRR. Gastrointestinal system: Abdomen is nondistended, soft and mild tenderness elicit on deep palpation. No guarding; positive BS. Central nervous system: Alert and oriented. No focal neurological deficits. Extremities: No C/C/E, +pedal pulses Skin: No rashes, lesions or ulcers Psychiatry: Judgement and insight appear normal. Mood & affect appropriate.    Data Reviewed: I have personally reviewed following labs and imaging studies  CBC: Recent Labs  Lab 12/12/19 1249 12/13/19 0735 12/14/19 0640 12/15/19 0606  WBC 18.7* 22.1* 12.9* 8.4  NEUTROABS  --   --   --  7.1  HGB 14.7 11.8* 11.9* 12.6  HCT 46.2* 37.6 37.6 39.8  MCV 93.3 93.8 94.0 93.9  PLT 215 157 148* 675    Basic Metabolic Panel: Recent Labs  Lab 12/12/19 1249 12/13/19 0735 12/14/19 0640  NA 134* 134* 133*  K 4.0 3.7 4.0  CL 96* 101 104  CO2 27 25 23   GLUCOSE 136* 135* 133*  BUN 8 11 9   CREATININE 0.64 0.64 0.55  CALCIUM 8.9 7.6* 7.6*  MG 1.7  --  2.0    GFR: Estimated Creatinine Clearance: 50.5 mL/min (by C-G formula based on SCr of 0.55 mg/dL).  Liver Function Tests: Recent Labs  Lab 12/12/19 1249  AST 17  ALT 13  ALKPHOS 55  BILITOT 0.8  PROT 8.1  ALBUMIN 4.2    CBG: Recent Labs  Lab 12/15/19 1559 12/15/19 2118  12/16/19 0132 12/16/19 0733 12/16/19 1210  GLUCAP 110* 119* 122* 96 107*     Recent Results (from the past 240 hour(s))  Culture, blood (routine x 2)     Status: None (Preliminary result)   Collection Time: 12/12/19  1:20 PM   Specimen: Right Antecubital; Blood  Result Value Ref Range Status   Specimen Description   Final    RIGHT ANTECUBITAL BOTTLES DRAWN AEROBIC AND ANAEROBIC   Special Requests Blood Culture adequate volume  Final   Culture   Final    NO GROWTH 4 DAYS Performed at [facility], [address] [place], [place] [address]    Report Status PENDING  Incomplete  Culture, blood (routine x 2)     Status: None (Preliminary result)   Collection Time: 12/12/19  1:26 PM   Specimen: BLOOD RIGHT HAND  Result Value Ref Range Status   Specimen Description   Final    BLOOD RIGHT HAND BOTTLES DRAWN AEROBIC AND ANAEROBIC   Special Requests   Final    Blood Culture results may not be optimal due to an inadequate volume of blood received in culture bottles   Culture  Final    NO GROWTH 4 DAYS Performed at [facility], [address] [place], [place] [address]    Report Status PENDING  Incomplete  SARS Coronavirus 2 by RT PCR (hospital order, performed in [facility] hospital lab) Nasopharyngeal Nasopharyngeal Swab     Status: None   Collection Time: 12/12/19  1:55 PM   Specimen: Nasopharyngeal Swab  Result Value Ref Range Status   SARS Coronavirus 2 NEGATIVE NEGATIVE Final    Comment: (NOTE) SARS-CoV-2 target nucleic acids are NOT DETECTED. The SARS-CoV-2 RNA is generally detectable in upper and lower respiratory specimens during the acute phase of infection. The lowest concentration of SARS-CoV-2 viral copies this assay can detect is 250 copies / mL. A negative result does not preclude SARS-CoV-2 infection and should not be used as the sole basis for treatment or other patient management decisions.  A negative result may occur with improper specimen collection /  handling, submission of specimen other than nasopharyngeal swab, presence of viral mutation(s) within the areas targeted by this assay, and inadequate number of viral copies (<250 copies / mL). A negative result must be combined with clinical observations, patient history, and epidemiological information. Fact Sheet for Patients:   StrictlyIdeas.no Fact Sheet for Healthcare Providers: BankingDealers.co.za This test is not yet approved or cleared  by the [place] FDA and has been authorized for detection and/or diagnosis of SARS-CoV-2 by FDA under an Emergency Use Authorization (EUA).  This EUA will remain in effect (meaning this test can be used) for the duration of the COVID-19 declaration under Section 564(b)(1) of the Act, 21 U.S.C. section 360bbb-3(b)(1), unless the authorization is terminated or revoked sooner. Performed at [facility], [address], [place], [place] [address]      Radiology Studies: CT ABDOMEN PELVIS W CONTRAST  Result Date: 12/15/2019 CLINICAL DATA:  Follow-up for diverticulitis. EXAM: CT ABDOMEN AND PELVIS WITH CONTRAST TECHNIQUE: Multidetector CT imaging of the abdomen and pelvis was performed using the standard protocol following bolus administration of intravenous contrast. CONTRAST:  36mL OMNIPAQUE IOHEXOL 300 MG/ML  SOLN COMPARISON:  12/12/2019 FINDINGS: Lower chest: Lung base fibrosis, stable.  No acute findings. Hepatobiliary: No focal liver abnormality is seen. No gallstones, gallbladder wall thickening, or biliary dilatation. Pancreas: Unremarkable. No pancreatic ductal dilatation or surrounding inflammatory changes. Spleen: Normal in size without focal abnormality. Adrenals/Urinary Tract: No adrenal mass. Kidneys normal in size, orientation and position with symmetric enhancement and excretion. 1.8 cm upper pole left renal cyst. 7 mm anterior midpole right renal cyst. No other masses, no stones and no  hydronephrosis. Normal ureters. Normal bladder. Fat containing area in the perinephric fat adjacent to the superolateral right kidney, likely an area of fat necrosis, unchanged. Stomach/Bowel: Poorly defined masslike abnormality contiguous with the anterior wall of the cecum, consistent with a poorly defined abscess or phlegmon, currently measuring 3.1 x 2.9 x 3.7 cm, increased from 2.6 x 2.3 x 3.2 cm on the prior exam. There is thickening of the wall of the cecum which is similar to the prior study. The right transverse colon abuts the area of inflammation arising from the anterior cecum. There are numerous colonic diverticula which predominate on the left. No other evidence of diverticulitis. No other areas of wall thickening or inflammation. Stomach and small bowel are unremarkable. Vascular/Lymphatic: Aortic atherosclerosis. No enlarged lymph nodes. Reproductive: Status post hysterectomy. No adnexal masses. Other: No ascites. There is subcutaneous soft tissue air the lower, new from the prior exam. Anterior abdominal wall Musculoskeletal:  No acute fracture. No osteoblastic or osteolytic lesions. IMPRESSION: 1. The poorly defined collection or phlegmon, contiguous with the anterior wall of the cecum, has mildly increased in size, but remains poorly defined, and may not be drainable. There are surrounding inflammatory changes and more diffuse wall thickening of the cecum, which is similar to the prior study. 2. No other acute abnormality within the abdomen or pelvis. No free air. No ascites. Electronically Signed   By: Lajean Manes M.D.   On: 12/15/2019 13:27    Scheduled Meds: . amoxicillin-clavulanate  1 tablet Oral Q12H  . aspirin  325 mg Oral Daily  . feeding supplement (GLUCERNA SHAKE)  237 mL Oral BID BM  . heparin  5,000 Units Subcutaneous Q8H  . insulin aspart  0-5 Units Subcutaneous QHS  . insulin aspart  0-9 Units Subcutaneous TID WC  . mometasone-formoterol  2 puff Inhalation BID  . morphine   4 mg Intravenous Once  . pantoprazole  40 mg Oral Daily  . pravastatin  10 mg Oral q1800  . QUEtiapine  12.5 mg Oral QHS   Continuous Infusions: . sodium chloride 75 mL/hr at 12/16/19 0230  . sodium chloride       LOS: 4 days    Time spent: 30 minutes.    Barton Dubois, MD [facility] [phone]   To contact the attending provider between 7A-7P or the covering provider during after hours 7P-7A, please log into the web site [email or web address] and access using universal [place] password for that web site. If you do not have the password, please call the hospital operator.  12/16/2019, 3:05 PM

## 2019-12-17 DIAGNOSIS — F10231 Alcohol dependence with withdrawal delirium: Secondary | ICD-10-CM

## 2019-12-17 DIAGNOSIS — J449 Chronic obstructive pulmonary disease, unspecified: Secondary | ICD-10-CM

## 2019-12-17 DIAGNOSIS — F10931 Alcohol use, unspecified with withdrawal delirium: Secondary | ICD-10-CM

## 2019-12-17 DIAGNOSIS — K219 Gastro-esophageal reflux disease without esophagitis: Secondary | ICD-10-CM

## 2019-12-17 LAB — CULTURE, BLOOD (ROUTINE X 2)
Culture: NO GROWTH
Culture: NO GROWTH
Special Requests: ADEQUATE

## 2019-12-17 LAB — GLUCOSE, CAPILLARY
Glucose-Capillary: 101 mg/dL — ABNORMAL HIGH (ref 70–99)
Glucose-Capillary: 133 mg/dL — ABNORMAL HIGH (ref 70–99)
Glucose-Capillary: 149 mg/dL — ABNORMAL HIGH (ref 70–99)
Glucose-Capillary: 147 mg/dL — ABNORMAL HIGH (ref 70–99)

## 2019-12-17 MED ORDER — QUETIAPINE FUMARATE 25 MG PO TABS: 12.5000 mg | ORAL_TABLET | Freq: Every day | ORAL | Status: DC

## 2019-12-17 MED ORDER — AMOXICILLIN-POT CLAVULANATE 875-125 MG PO TABS: 1.0000 | ORAL_TABLET | Freq: Two times a day (BID) | ORAL | 0 refills | Status: DC

## 2019-12-17 NOTE — Care Management Important Message (Signed)
Important Message  Patient Details  Name: [name] MRN: [id number] Date of Birth: 12-Jun-1941   Medicare Important Message Given:  Yes     Tommy Medal 12/17/2019, 12:22 PM

## 2019-12-17 NOTE — Discharge Summary (Signed)
Physician Discharge Summary  [name]:[id number] DOB: April 14, 1941 DOA: 12/12/2019  PCP: Rosita Fire, MD  Admit date: 12/12/2019 Discharge date: 12/17/2019  Time spent: 35 minutes  Recommendations for Outpatient Follow-up:  1. Repeat basic metabolic panel to follow trends and renal function 2. Reassess blood pressure and further adjust antihypertensive regimen as needed 3. Continue to follow CBGs with further adjustment to hypoglycemic regimen as required. 4. Outpatient follow-up with general surgery as instructed.   Discharge Diagnoses:  Active Problems:   Tobacco abuse   Type 2 diabetes mellitus with hyperlipidemia (HCC)   Benign essential HTN   Interstitial lung disease (HCC)   Sepsis ([place])   Diverticulitis of large intestine with perforation without bleeding   Delirium tremens ([place])   Chronic obstructive pulmonary disease (HCC)   Gastroesophageal reflux disease   Discharge Condition: Stable and improved. Patient will be discharged to skilled nursing facility for further care and rehabilitation.  CODE STATUS: Full code.  Diet recommendation: Heart healthy/modified carbohydrate diet.  Filed Weights   12/12/19 1231  Weight: 59 kg    History of present illness:  79 y.o.female,has medical history of hypertension, hyperlipidemia, diabetes mellitus, tobacco abuse, patient presents to ED secondary to complaints of abdominal pain, reports pain started 6:00 am this morning, appears to be sudden onset, as well she reports multiple episodes of nonbloody diarrhea, reported some chills at home, and found to be febrile in ED, reports some fatigue, and expressed abdominal pain to be constant, nonradiating, she did not have such pain in the past, does have history of diverticulitis, but not experiencing such pain before, she denies dysuria, polyuria, cough, congestion or COVID-19 exposure. - in EDpatient was noted Abrol 101, blood pressure on the lower side, she had leukocytosis  when, elevated lactic acid at 2.4, CT abdomen pelvis significant for cecal diverticulitis with early perforation, patient was started on IV Zosyn, general surgery was consulted and [facility] hospitalist consulted to admit.  Hospital Course:  1-sepsis secondary to diverticulitis of large intestine with perforation  -Sepsis features resolved. -She continues to reports mild abdominal discomfort; but repeat CT scan demonstrated stability and no need for surgical intervention or abscess drainage currently. -Antibiotic has been transitioned to oral regimen with intention to complete 8 more days at time of discharge. -No fever, no WBCs and tolerating oral medications and diet.. -Continue as needed acetaminophen for pain control.  2-benign essential HTN -Blood pressure stable and well-controlled. -Advised to follow heart healthy diet -Resume home antihypertensive regimen.  3-history of type 2 diabetes with neuropathy -Oral hypoglycemic regimen -Continue modified carbohydrate diet -Continue the use of Neurontin.  4-history of CVA -No new focal deficits appreciated. -Continue full dose aspirin for secondary prevention -Continue risk factor modifications.  5-hyperlipidemia -Continue statins.  6-tobacco abuse -Cessation counseling provided -Patient has declined nicotine patch at discharge.  7-anxiety/high risk for hospital-acquired delirium -Stable mood. -Continue nightly low-dose Seroquel. -Oriented x3 currently.  8-physical deconditioning -PT has evaluated patient and recommended skilled nursing facility at discharge. -Patient medically stable and ready for skilled nursing rehabilitation and further care.     Procedures: See below for x-ray reports  Consultations:  General surgery  Discharge Exam: Vitals:   12/17/19 0814 12/17/19 1440  BP:  120/74  Pulse:  89  Resp:  18  Temp:  98.2 F (36.8 C)  SpO2: 94% 98%    General: Afebrile, no chest pain, no nausea, no  vomiting, tolerating diet and expressing chills mild discomfort in her abdomen. She is feeling  weak and deconditioned and is ready to go to skilled nursing facility for rehabilitation. Cardiovascular: S1 and S2, no rubs, no gallops, no JVD. Respiratory: Scattered rhonchi bilaterally; no using accessory muscles, good oxygen saturation on room air. No wheezing or crackles appreciated. Abdomen: Mild discomfort left lower quadrant (deep palpation); positive bowel sounds, no guarding, soft. Extremities: No edema, no cyanosis or clubbing.  Discharge Instructions   Discharge Instructions    Diet - low sodium heart healthy   Complete by: As directed    Discharge instructions   Complete by: As directed    Patient follow-up with general surgery as instructed Take medications as prescribed Maintain adequate hydration Increase fiber ingestion in your diet. -Complete oral antibiotics as prescribed. Repeat basic metabolic panel in 1 week to follow electrolytes and renal function. Stop smoking.   Increase activity slowly   Complete by: As directed      Allergies as of 12/17/2019      Reactions   Pollen Extract Other (See Comments)   Runny nose, watery eyes and ears get stopped up   Red Dye       Medication List    STOP taking these medications   amLODipine 5 MG tablet Commonly known as: NORVASC   diphenoxylate-atropine 2.5-0.025 MG tablet Commonly known as: LOMOTIL   Mitigare 0.6 MG Caps Generic drug: Colchicine     TAKE these medications   acetaminophen 650 MG CR tablet Commonly known as: TYLENOL Take 650 mg by mouth as needed for pain.   albuterol 108 (90 Base) MCG/ACT inhaler Commonly known as: VENTOLIN HFA Inhale 1 puff into the lungs every 6 (six) hours as needed for wheezing or shortness of breath.   amoxicillin-clavulanate 875-125 MG tablet Commonly known as: AUGMENTIN Take 1 tablet by mouth every 12 (twelve) hours for 8 days.   aspirin EC 325 MG tablet Take 325 mg  by mouth daily.   calcium-vitamin D 500-200 MG-UNIT Tabs tablet Commonly known as: OSCAL WITH D Take 1 tablet by mouth in the morning, at noon, and at bedtime.   ferrous sulfate 325 (65 FE) MG tablet Take 325 mg by mouth daily with breakfast.   fluticasone 50 MCG/ACT nasal spray Commonly known as: FLONASE Place 2 sprays into both nostrils as needed.   gabapentin 600 MG tablet Commonly known as: NEURONTIN Take 600 mg by mouth 3 (three) times daily.   lovastatin 40 MG tablet Commonly known as: MEVACOR Take 40 mg by mouth at bedtime.   metFORMIN 500 MG tablet Commonly known as: GLUCOPHAGE Take 500 mg by mouth 2 (two) times daily.   metoprolol tartrate 25 MG tablet Commonly known as: LOPRESSOR Take 25 mg by mouth daily.   metroNIDAZOLE 0.75 % vaginal gel Commonly known as: METROGEL Place 1 Applicatorful vaginally once a week. one applicatorful to vagina at bedtime weekly , as needed for vaginal discharge with pessary   naphazoline-glycerin 0.012-0.2 % Soln Commonly known as: CLEAR EYES REDNESS Place 2 drops into both eyes daily as needed for irritation.   omeprazole 20 MG capsule Commonly known as: PRILOSEC Take 1 capsule by mouth daily.   QUEtiapine 25 MG tablet Commonly known as: SEROQUEL Take 0.5 tablets (12.5 mg total) by mouth at bedtime.   Symbicort 160-4.5 MCG/ACT inhaler Generic drug: budesonide-formoterol Inhale 2 puffs into the lungs as needed.   tiotropium 18 MCG inhalation capsule Commonly known as: SPIRIVA Place 18 mcg into inhaler and inhale daily as needed.   Tradjenta 5 MG Tabs tablet Generic drug:  linagliptin Take 5 mg by mouth daily.      Allergies  Allergen Reactions  . Pollen Extract Other (See Comments)    Runny nose, watery eyes and ears get stopped up  . Red Dye     Contact information for follow-up providers    Virl Cagey, MD Follow up on 01/01/2020.   Specialty: General Surgery Why: follow up after perforated  diverticulitis  Contact information: [address] [place] [facility] [address] [phone]        Rosita Fire, MD. Schedule an appointment as soon as possible for a visit in 2 week(s).   Specialty: Internal Medicine Why: After discharge from the skilled nursing facility. Contact information: [address] [place] [address] [phone]            Contact information for after-discharge care    [facility] .   Service: Skilled Nursing Contact information: [place] [phone]                  The results of significant diagnostics from this hospitalization (including imaging, microbiology, ancillary and laboratory) are listed below for reference.    Significant Diagnostic Studies: CT ABDOMEN PELVIS W CONTRAST  Result Date: 12/15/2019 CLINICAL DATA:  Follow-up for diverticulitis. EXAM: CT ABDOMEN AND PELVIS WITH CONTRAST TECHNIQUE: Multidetector CT imaging of the abdomen and pelvis was performed using the standard protocol following bolus administration of intravenous contrast. CONTRAST:  25mL OMNIPAQUE IOHEXOL 300 MG/ML  SOLN COMPARISON:  12/12/2019 FINDINGS: Lower chest: Lung base fibrosis, stable.  No acute findings. Hepatobiliary: No focal liver abnormality is seen. No gallstones, gallbladder wall thickening, or biliary dilatation. Pancreas: Unremarkable. No pancreatic ductal dilatation or surrounding inflammatory changes. Spleen: Normal in size without focal abnormality. Adrenals/Urinary Tract: No adrenal mass. Kidneys normal in size, orientation and position with symmetric enhancement and excretion. 1.8 cm upper pole left renal cyst. 7 mm anterior midpole right renal cyst. No other masses, no stones and no hydronephrosis. Normal ureters. Normal bladder. Fat containing area in the perinephric fat adjacent to the superolateral right kidney, likely an area of fat necrosis, unchanged.  Stomach/Bowel: Poorly defined masslike abnormality contiguous with the anterior wall of the cecum, consistent with a poorly defined abscess or phlegmon, currently measuring 3.1 x 2.9 x 3.7 cm, increased from 2.6 x 2.3 x 3.2 cm on the prior exam. There is thickening of the wall of the cecum which is similar to the prior study. The right transverse colon abuts the area of inflammation arising from the anterior cecum. There are numerous colonic diverticula which predominate on the left. No other evidence of diverticulitis. No other areas of wall thickening or inflammation. Stomach and small bowel are unremarkable. Vascular/Lymphatic: Aortic atherosclerosis. No enlarged lymph nodes. Reproductive: Status post hysterectomy. No adnexal masses. Other: No ascites. There is subcutaneous soft tissue air the lower, new from the prior exam. Anterior abdominal wall Musculoskeletal: No acute fracture. No osteoblastic or osteolytic lesions. IMPRESSION: 1. The poorly defined collection or phlegmon, contiguous with the anterior wall of the cecum, has mildly increased in size, but remains poorly defined, and may not be drainable. There are surrounding inflammatory changes and more diffuse wall thickening of the cecum, which is similar to the prior study. 2. No other acute abnormality within the abdomen or pelvis. No free air. No ascites. Electronically Signed   By: Lajean Manes M.D.   On: 12/15/2019 13:27   CT ABDOMEN PELVIS  W CONTRAST  Result Date: 12/12/2019 CLINICAL DATA:  Lower abdominal pain since last night, multiple bouts of diarrhea, history of breast cancer, diverticulitis, appendectomy, hysterectomy EXAM: CT ABDOMEN AND PELVIS WITH CONTRAST TECHNIQUE: Multidetector CT imaging of the abdomen and pelvis was performed using the standard protocol following bolus administration of intravenous contrast. CONTRAST:  141mL OMNIPAQUE IOHEXOL 300 MG/ML  SOLN COMPARISON:  CT abdomen pelvis 04/29/2017 FINDINGS: Lower chest:  Extensive subpleural reticular changes throughout the lung bases likely reflecting some chronic interstitial lung disease, previously characterized as likely UIP. Overall extent of disease in the visible bases is similar to minimally increased. Normal heart size. No pericardial effusion. Coronary atherosclerosis. Mitral annular calcification. Hepatobiliary: No focal liver abnormality is seen. No gallstones, gallbladder wall thickening, or biliary dilatation. Pancreas: Unremarkable. No pancreatic ductal dilatation or surrounding inflammatory changes. Spleen: Normal in size without focal abnormality. Adrenals/Urinary Tract: Normal adrenal glands continued stability of a retroperitoneal lesion adjacent the right renal fossa. Kidneys enhance and excrete symmetrically. Few simple appearing renal cysts. No concerning renal lesions. No urolithiasis or hydronephrosis. Normal bladder. Suspect pelvic floor laxity with cystocele. Stomach/Bowel: Small sliding-type hiatal hernia. Esophagus, stomach and duodenum are otherwise unremarkable. Mild thickening at the terminal ileum with more pronounced circumferential thickening and phlegmonous change centered upon the cecum as well as a an outpouching along the anterior margin which appears to reflect a contained volume of air and fluid with extensive surrounding inflammatory phlegmonous change as well as some associated reactive thickening of the slightly redundant proximal transverse colon which courses in the immediate vicinity. This may arise from right-sided diverticula seen in this vicinity on comparison CT more distal colonic segments are free of thickening or dilatation. Scattered colonic diverticula throughout much of the distal colon are unremarkable. Vascular/Lymphatic: Atherosclerotic plaque within aorta and branch vessels. Focal fusiform ectasia of the infrarenal abdominal aorta up to 2 cm. No other aneurysm or ectasia. Reactive adenopathy in the abdomen. No  pathologically enlarged abdominopelvic nodes. Reproductive: Uterus is surgically absent. No concerning adnexal lesions. Other: Contained air and fluid about the cecum as above with surrounding phlegmon. No free air or fluid in the abdomen or pelvis. No bowel containing hernias. Musculoskeletal: Stable superior endplate deformity G89 with superimposed Schmorl's node. Stepwise retrolisthesis L2-L5 is unchanged from prior. Multilevel degenerative changes are present in the imaged portions of the spine. Additional degenerative changes in the SI joints and hips. No acute osseous abnormality or suspicious osseous lesion. IMPRESSION: 1. Contained volume of air and fluid about the cecum with extensive surrounding inflammatory phlegmonous change as well as some associated reactive thickening of the slightly redundant proximal transverse colon which courses in the immediate vicinity. Findings are favored to represent a perforated diverticulitis with contained collection/abscess. 2. Extensive subpleural reticular changes throughout the lung bases likely reflecting some chronic interstitial lung disease, previously characterized as likely UIP. Overall extent of disease in the visible bases is similar to minimally increased. 3. Small sliding-type hiatal hernia. 4. Suspect pelvic floor laxity with cystocele. 5. Stable remote compression deformity T11 6. Aortic Atherosclerosis (ICD10-I70.0). These results were called by telephone at the time of interpretation on 12/12/2019 at 3:29 pm to provider JOSHUA LONG , who verbally acknowledged these results. Electronically Signed   By: Lovena Le M.D.   On: 12/12/2019 15:29   DG Chest Portable 1 View  Result Date: 12/12/2019 CLINICAL DATA:  Upper abdominal pain, fever EXAM: PORTABLE CHEST 1 VIEW COMPARISON:  03/22/2016 FINDINGS: Diffuse coarsened interstitial prominence throughout the lungs is similar  prior study compatible with chronic lung disease/fibrosis. No acute confluent  airspace opacity or effusion. Heart is normal size. Aortic atherosclerosis. No acute bony abnormality. IMPRESSION: Chronic lung disease/fibrosis.  No acute cardiopulmonary disease. Electronically Signed   By: Rolm Baptise M.D.   On: 12/12/2019 13:32    Microbiology: Recent Results (from the past 240 hour(s))  Culture, blood (routine x 2)     Status: None   Collection Time: 12/12/19  1:20 PM   Specimen: Right Antecubital; Blood  Result Value Ref Range Status   Specimen Description   Final    RIGHT ANTECUBITAL BOTTLES DRAWN AEROBIC AND ANAEROBIC   Special Requests Blood Culture adequate volume  Final   Culture   Final    NO GROWTH 5 DAYS Performed at [facility], [address], [place], [place] [address]    Report Status 12/17/2019 FINAL  Final  Culture, blood (routine x 2)     Status: None   Collection Time: 12/12/19  1:26 PM   Specimen: BLOOD RIGHT HAND  Result Value Ref Range Status   Specimen Description   Final    BLOOD RIGHT HAND BOTTLES DRAWN AEROBIC AND ANAEROBIC   Special Requests   Final    Blood Culture results may not be optimal due to an inadequate volume of blood received in culture bottles   Culture   Final    NO GROWTH 5 DAYS Performed at [facility], [address] [place], [place] [address]    Report Status 12/17/2019 FINAL  Final  SARS Coronavirus 2 by RT PCR (hospital order, performed in [facility] hospital lab) Nasopharyngeal Nasopharyngeal Swab     Status: None   Collection Time: 12/12/19  1:55 PM   Specimen: Nasopharyngeal Swab  Result Value Ref Range Status   SARS Coronavirus 2 NEGATIVE NEGATIVE Final    Comment: (NOTE) SARS-CoV-2 target nucleic acids are NOT DETECTED. The SARS-CoV-2 RNA is generally detectable in upper and lower respiratory specimens during the acute phase of infection. The lowest concentration of SARS-CoV-2 viral copies this assay can detect is 250 copies / mL. A negative result does not preclude SARS-CoV-2 infection and should not  be used as the sole basis for treatment or other patient management decisions.  A negative result may occur with improper specimen collection / handling, submission of specimen other than nasopharyngeal swab, presence of viral mutation(s) within the areas targeted by this assay, and inadequate number of viral copies (<250 copies / mL). A negative result must be combined with clinical observations, patient history, and epidemiological information. Fact Sheet for Patients:   StrictlyIdeas.no Fact Sheet for Healthcare Providers: BankingDealers.co.za This test is not yet approved or cleared  by the [place] FDA and has been authorized for detection and/or diagnosis of SARS-CoV-2 by FDA under an Emergency Use Authorization (EUA).  This EUA will remain in effect (meaning this test can be used) for the duration of the COVID-19 declaration under Section 564(b)(1) of the Act, 21 U.S.C. section 360bbb-3(b)(1), unless the authorization is terminated or revoked sooner. Performed at [facility], [address] [place], [place] [address]      Labs: Basic Metabolic Panel: Recent Labs  Lab 12/12/19 1249 12/13/19 0735 12/14/19 0640  NA 134* 134* 133*  K 4.0 3.7 4.0  CL 96* 101 104  CO2 27 25 23   GLUCOSE 136* 135* 133*  BUN 8 11 9   CREATININE 0.64 0.64 0.55  CALCIUM 8.9 7.6* 7.6*  MG 1.7  --  2.0  Liver Function Tests: Recent Labs  Lab 12/12/19 1249  AST 17  ALT 13  ALKPHOS 55  BILITOT 0.8  PROT 8.1  ALBUMIN 4.2   Recent Labs  Lab 12/12/19 1249  LIPASE 46   CBC: Recent Labs  Lab 12/12/19 1249 12/13/19 0735 12/14/19 0640 12/15/19 0606  WBC 18.7* 22.1* 12.9* 8.4  NEUTROABS  --   --   --  7.1  HGB 14.7 11.8* 11.9* 12.6  HCT 46.2* 37.6 37.6 39.8  MCV 93.3 93.8 94.0 93.9  PLT 215 157 148* 155   CBG: Recent Labs  Lab 12/16/19 1611 12/16/19 2025 12/17/19 0731 12/17/19 1136 12/17/19 1537  GLUCAP 121* 160* 101*  133* 149*    Signed:  Barton Dubois MD.  [facility] 12/17/2019, 4:37 PM

## 2019-12-17 NOTE — Plan of Care (Signed)
  Problem: Education: Goal: Knowledge of General Education information will improve Description: Including pain rating scale, medication(s)/side effects and non-pharmacologic comfort measures Outcome: Adequate for Discharge   Problem: Health Behavior/Discharge Planning: Goal: Ability to manage health-related needs will improve Outcome: Adequate for Discharge   Problem: Activity: Goal: Risk for activity intolerance will decrease Outcome: Adequate for Discharge   Problem: Safety: Goal: Ability to remain free from injury will improve Outcome: Adequate for Discharge

## 2019-12-17 NOTE — Progress Notes (Signed)
Physical Therapy Treatment Patient Details Name: [name] MRN: [id number] DOB: 02/02/1941 Today's Date: 12/17/2019    History of Present Illness [name]  is a 79 y.o. female, has medical history of hypertension, hyperlipidemia, diabetes mellitus, tobacco abuse, patient presents to ED secondary to complaints of abdominal pain, reports pain started 6:00 this morning, appears to be of a sudden onset, as well she reports multiple episodes of nonbloody diarrhea, ports some chills at home, but she was febrile in ED, reports some fatigue, ports abdominal pain is constant, nonradiating, she did not have such pain in the past, does have history of diverticulitis, but not experiencing such pain before, he denies dysuria, polyuria, cough, congestion or COVID-19 exposure.- in ED patient was noted Abrol 101, blood pressure on the lower side, she had leukocytosis when, elevated lactic acid at 2.4, CT abdomen pelvis significant for cecal diverticulitis with early perforation, patient was started on IV Zosyn, general surgery were consulted and treated hospitalist consulted to admit.    PT Comments    Patient demonstrates slow labored movement for taking steps with decreased step/stride length and tendency to push RW to far in front once fatigued, able to transfer to commode in bathroom using  RW and grab bar, stood in front of sink to wash hands leaning on counter top, limited for ambulation due to c/o fatigue and tolerated sitting up in chair after therapy - RN aware.  Patient will benefit from continued physical therapy in hospital and recommended venue below to increase strength, balance, endurance for safe ADLs and gait.   Follow Up Recommendations  SNF     Equipment Recommendations  Rolling walker with 5" wheels    Recommendations for Other Services       Precautions / Restrictions Precautions Precautions: Fall Restrictions Weight Bearing Restrictions: No    Mobility  Bed Mobility Overal  bed mobility: Needs Assistance Bed Mobility: Supine to Sit     Supine to sit: Supervision;HOB elevated     General bed mobility comments: increased time, slightly labored movement  Transfers Overall transfer level: Needs assistance Equipment used: Rolling walker (2 wheeled) Transfers: Sit to/from [facility] Sit to Stand: Min assist Stand pivot transfers: Min assist       General transfer comment: unsteady on feet, pushes RW to far in front, had to use grab bar when sit to standing from commode in bathroom  Ambulation/Gait Ambulation/Gait assistance: Min assist Gait Distance (Feet): 25 Feet Assistive device: Rolling walker (2 wheeled) Gait Pattern/deviations: Decreased step length - right;Decreased step length - left;Decreased stride length;Ataxic Gait velocity: decreased   General Gait Details: slow labored ataxic like steps with short step/stride length, leans over RW once fatigued and requires verbal cues to step closer to RW with fair carryover, limited secondary to c/o fatigue   Stairs             Wheelchair Mobility    Modified Rankin (Stroke Patients Only)       Balance Overall balance assessment: Needs assistance Sitting-balance support: Feet supported;No upper extremity supported Sitting balance-Leahy Scale: Good Sitting balance - Comments: seated at EOB   Standing balance support: During functional activity;Bilateral upper extremity supported Standing balance-Leahy Scale: Fair Standing balance comment: using RW                            Cognition Arousal/Alertness: Awake/alert Behavior During Therapy: WFL for tasks assessed/performed Overall Cognitive Status: Within Functional Limits for tasks  assessed                                        Exercises General Exercises - Lower Extremity Long Arc Quad: Seated;AROM;Strengthening;Both;10 reps Hip Flexion/Marching: Seated;AROM;Strengthening;Both;10  reps Toe Raises: Seated;AROM;Strengthening;Both;10 reps Heel Raises: Seated;AROM;Strengthening;Both;10 reps    General Comments        Pertinent Vitals/Pain Pain Assessment: No/denies pain    Home Living                      Prior Function            PT Goals (current goals can now be found in the care plan section) Acute Rehab PT Goals Patient Stated Goal: return home able to walk safely after rehab PT Goal Formulation: With patient Time For Goal Achievement: 12/29/19 Potential to Achieve Goals: Good Progress towards PT goals: Progressing toward goals    Frequency    Min 3X/week      PT Plan Current plan remains appropriate    Co-evaluation              AM-PAC PT "6 Clicks" Mobility   Outcome Measure  Help needed turning from your back to your side while in a flat bed without using bedrails?: None Help needed moving from lying on your back to sitting on the side of a flat bed without using bedrails?: None Help needed moving to and from a bed to a chair (including a wheelchair)?: A Lot Help needed standing up from a chair using your arms (e.g., wheelchair or bedside chair)?: A Lot Help needed to walk in hospital room?: A Lot Help needed climbing 3-5 steps with a railing? : A Lot 6 Click Score: 16    End of Session   Activity Tolerance: Patient tolerated treatment well;Patient limited by fatigue Patient left: in chair;with call bell/phone within reach Nurse Communication: Mobility status PT Visit Diagnosis: Unsteadiness on feet (R26.81);Other abnormalities of gait and mobility (R26.89);Muscle weakness (generalized) (M62.81)     Time: 7473-4037 PT Time Calculation (min) (ACUTE ONLY): 28 min  Charges:  $Therapeutic Exercise: 8-22 mins $Therapeutic Activity: 8-22 mins                     2:26 PM, 12/17/19 Lonell Grandchild, MPT Physical Therapist with [facility] [phone] office [phone] mobile phone

## 2019-12-18 DIAGNOSIS — A419 Sepsis, unspecified organism: Secondary | ICD-10-CM

## 2019-12-18 DIAGNOSIS — K5720 Diverticulitis of large intestine with perforation and abscess without bleeding: Secondary | ICD-10-CM

## 2019-12-18 DIAGNOSIS — E1169 Type 2 diabetes mellitus with other specified complication: Secondary | ICD-10-CM

## 2019-12-18 DIAGNOSIS — I10 Essential (primary) hypertension: Secondary | ICD-10-CM

## 2019-12-18 DIAGNOSIS — E785 Hyperlipidemia, unspecified: Secondary | ICD-10-CM

## 2019-12-18 DIAGNOSIS — Z720 Tobacco use: Secondary | ICD-10-CM

## 2019-12-18 LAB — GLUCOSE, CAPILLARY
Glucose-Capillary: 121 mg/dL — ABNORMAL HIGH (ref 70–99)
Glucose-Capillary: 141 mg/dL — ABNORMAL HIGH (ref 70–99)
Glucose-Capillary: 134 mg/dL — ABNORMAL HIGH (ref 70–99)

## 2019-12-18 MED ORDER — METOPROLOL TARTRATE 25 MG PO TABS
12.5000 mg | ORAL_TABLET | Freq: Two times a day (BID) | ORAL | Status: DC
  Administered 2019-12-18 – 2019-12-19 (×2): 12.5 mg via ORAL
  Filled 2019-12-18 (×2): qty 1

## 2019-12-18 MED ORDER — METOPROLOL TARTRATE 25 MG PO TABS: 12.5000 mg | ORAL_TABLET | Freq: Two times a day (BID) | ORAL | Status: AC

## 2019-12-18 NOTE — Plan of Care (Signed)

## 2019-12-18 NOTE — Progress Notes (Signed)
PROGRESS NOTE  [name] XNT:[id number] DOB: 03-10-1941 DOA: 12/12/2019 PCP: Rosita Fire, MD  Brief History:  79 y.o.female,has medical history of hypertension, hyperlipidemia, diabetes mellitus, tobacco abuse, patient presents to ED secondary to complaints of abdominal pain, reports pain started 6:00 am on 12/12/19 with nonbloody loose stools with subjective f/c.  - in EDpatient had temp of 100.8, blood pressure on the lower side (96/54), she had leukocytosis when, elevated lactic acid at 2.4, CT abdomen pelvis significant for cecal diverticulitis with early perforation, patient was started on IV Zosyn, general surgery was consulted and [facility] hospitalist consulted to admit.  She was initially placed on bowel rest and started on IVF.  Assessment/Plan: sepsis secondary to diverticulitis of large intestine with perforation  -Sepsisphysiology resolved. -She continues to reports mild abdominal discomfort;butrepeat 12/15/19  CT scan abd demonstrated stability and no need for surgical intervention or abscess drainage currently. -Antibiotic has been transitioned to oral regimen with intention to complete 7 more days at time of discharge. -No fever, no WBCs and tolerating oral medications and diet.. -Continue as needed acetaminophen for pain control.  benign essential HTN -Blood pressure stable  -follow heart healthy diet -Resume home antihypertensive regimen-- metoprolol  type 2 diabetes with neuropathy -Oral hypoglycemic regimen -Continue modified carbohydrate diet -Continue the use of Neurontin. -12/12/19 A1c--5.9 -resume metformin and tradjenta at d/c  history of CVA -No new focal deficits appreciated. -Continue full dose aspirin for secondary prevention -Continue risk factor modifications.  hyperlipidemia -Continue statins.  tobacco abuse -Cessation counseling provided -Patient has declined nicotine patch at discharge.  anxiety/mood disorder -Stable  mood. -Continue nightly low-dose Seroquel. -Oriented x3currently.  physical deconditioning -PT has evaluated patient and recommended skilled nursing facility at discharge. -Patient medically stable and ready for skilled nursing rehabilitation and further care.        Status is: Inpatient    Dispo: The patient is from: Home              Anticipated d/c is to: SNF              Anticipated d/c date is: 1 day              Patient currently is medically stable to d/c.  Barrier includes waiting for insurance authorization         Family Communication:   No Family at bedside  Consultants:  General surgery  Code Status:  FULL   DVT Prophylaxis:  [place] Heparin   Procedures: As Listed in Progress Note Above  Antibiotics: Amox/clav 5/18>>>>     Subjective: Pt complains of lower abd pain, but is continues to improve.  Had BM today.  Denies f/c, cp, sob, n/v/d, dysuria  Objective: Vitals:   12/17/19 2123 12/18/19 0514 12/18/19 0727 12/18/19 1322  BP: (!) 157/65 (!) 110/94  135/81  Pulse: 92 99  92  Resp: 20 18  16   Temp: 97.8 F (36.6 C) 98.9 F (37.2 C)  97.9 F (36.6 C)  TempSrc:  Oral    SpO2: 97% 97% 96% 98%  Weight:      Height:        Intake/Output Summary (Last 24 hours) at 12/18/2019 1622 Last data filed at 12/18/2019 1300 Gross per 24 hour  Intake 480 ml  Output 1250 ml  Net -770 ml   Weight change:  Exam:   General:  Pt is alert, follows commands appropriately, not in acute distress  HEENT: No  icterus, No thrush, No neck mass, [place]/AT  Cardiovascular: RRR, S1/S2, no rubs, no gallops  Respiratory: bibasilar rales. No wheeze  Abdomen: Soft/+BS, RLQ tender, non distended, no guarding  Extremities: No edema, No lymphangitis, No petechiae, No rashes, no synovitis   Data Reviewed: I have personally reviewed following labs and imaging studies Basic Metabolic Panel: Recent Labs  Lab 12/12/19 1249 12/13/19 0735 12/14/19 0640  NA  134* 134* 133*  K 4.0 3.7 4.0  CL 96* 101 104  CO2 27 25 23   GLUCOSE 136* 135* 133*  BUN 8 11 9   CREATININE 0.64 0.64 0.55  CALCIUM 8.9 7.6* 7.6*  MG 1.7  --  2.0   Liver Function Tests: Recent Labs  Lab 12/12/19 1249  AST 17  ALT 13  ALKPHOS 55  BILITOT 0.8  PROT 8.1  ALBUMIN 4.2   Recent Labs  Lab 12/12/19 1249  LIPASE 46   No results for input(s): AMMONIA in the last 168 hours. Coagulation Profile: No results for input(s): INR, PROTIME in the last 168 hours. CBC: Recent Labs  Lab 12/12/19 1249 12/13/19 0735 12/14/19 0640 12/15/19 0606  WBC 18.7* 22.1* 12.9* 8.4  NEUTROABS  --   --   --  7.1  HGB 14.7 11.8* 11.9* 12.6  HCT 46.2* 37.6 37.6 39.8  MCV 93.3 93.8 94.0 93.9  PLT 215 157 148* 155   Cardiac Enzymes: No results for input(s): CKTOTAL, CKMB, CKMBINDEX, TROPONINI in the last 168 hours. BNP: Invalid input(s): POCBNP CBG: Recent Labs  Lab 12/17/19 1136 12/17/19 1537 12/17/19 2121 12/18/19 1157 12/18/19 1541  GLUCAP 133* 149* 147* 121* 141*   HbA1C: No results for input(s): HGBA1C in the last 72 hours. Urine analysis:    Component Value Date/Time   COLORURINE AMBER (A) 07/31/2017 0949   APPEARANCEUR TURBID (A) 07/31/2017 0949   LABSPEC 1.010 07/31/2017 0949   PHURINE 6.0 07/31/2017 0949   GLUCOSEU NEGATIVE 07/31/2017 0949   HGBUR SMALL (A) 07/31/2017 0949   BILIRUBINUR NEGATIVE 07/31/2017 0949   KETONESUR NEGATIVE 07/31/2017 0949   PROTEINUR 100 (A) 07/31/2017 0949   UROBILINOGEN 0.2 08/02/2013 1440   NITRITE POSITIVE (A) 07/31/2017 0949   LEUKOCYTESUR MODERATE (A) 07/31/2017 0949   Sepsis Labs: @LABRCNTIP (procalcitonin:4,lacticidven:4) ) Recent Results (from the past 240 hour(s))  Culture, blood (routine x 2)     Status: None   Collection Time: 12/12/19  1:20 PM   Specimen: Right Antecubital; Blood  Result Value Ref Range Status   Specimen Description   Final    RIGHT ANTECUBITAL BOTTLES DRAWN AEROBIC AND ANAEROBIC   Special  Requests Blood Culture adequate volume  Final   Culture   Final    NO GROWTH 5 DAYS Performed at [facility], [address], [place], [place] [address]    Report Status 12/17/2019 FINAL  Final  Culture, blood (routine x 2)     Status: None   Collection Time: 12/12/19  1:26 PM   Specimen: BLOOD RIGHT HAND  Result Value Ref Range Status   Specimen Description   Final    BLOOD RIGHT HAND BOTTLES DRAWN AEROBIC AND ANAEROBIC   Special Requests   Final    Blood Culture results may not be optimal due to an inadequate volume of blood received in culture bottles   Culture   Final    NO GROWTH 5 DAYS Performed at [facility], [address], [place], [place] [address]    Report Status 12/17/2019 FINAL  Final  SARS Coronavirus 2 by  RT PCR (hospital order, performed in [facility] hospital lab) Nasopharyngeal Nasopharyngeal Swab     Status: None   Collection Time: 12/12/19  1:55 PM   Specimen: Nasopharyngeal Swab  Result Value Ref Range Status   SARS Coronavirus 2 NEGATIVE NEGATIVE Final    Comment: (NOTE) SARS-CoV-2 target nucleic acids are NOT DETECTED. The SARS-CoV-2 RNA is generally detectable in upper and lower respiratory specimens during the acute phase of infection. The lowest concentration of SARS-CoV-2 viral copies this assay can detect is 250 copies / mL. A negative result does not preclude SARS-CoV-2 infection and should not be used as the sole basis for treatment or other patient management decisions.  A negative result may occur with improper specimen collection / handling, submission of specimen other than nasopharyngeal swab, presence of viral mutation(s) within the areas targeted by this assay, and inadequate number of viral copies (<250 copies / mL). A negative result must be combined with clinical observations, patient history, and epidemiological information. Fact Sheet for Patients:   StrictlyIdeas.no Fact Sheet for Healthcare  Providers: BankingDealers.co.za This test is not yet approved or cleared  by the [place] FDA and has been authorized for detection and/or diagnosis of SARS-CoV-2 by FDA under an Emergency Use Authorization (EUA).  This EUA will remain in effect (meaning this test can be used) for the duration of the COVID-19 declaration under Section 564(b)(1) of the Act, 21 U.S.C. section 360bbb-3(b)(1), unless the authorization is terminated or revoked sooner. Performed at [facility], [address] [place], [place] [address]      Scheduled Meds:  amoxicillin-clavulanate  1 tablet Oral Q12H   aspirin  325 mg Oral Daily   feeding supplement (GLUCERNA SHAKE)  237 mL Oral BID BM   heparin  5,000 Units Subcutaneous Q8H   insulin aspart  0-5 Units Subcutaneous QHS   insulin aspart  0-9 Units Subcutaneous TID WC   mometasone-formoterol  2 puff Inhalation BID   morphine  4 mg Intravenous Once   pantoprazole  40 mg Oral Daily   pravastatin  10 mg Oral q1800   QUEtiapine  12.5 mg Oral QHS   Continuous Infusions:  sodium chloride 75 mL/hr at 12/18/19 0617   sodium chloride      Procedures/Studies: CT ABDOMEN PELVIS W CONTRAST  Result Date: 12/15/2019 CLINICAL DATA:  Follow-up for diverticulitis. EXAM: CT ABDOMEN AND PELVIS WITH CONTRAST TECHNIQUE: Multidetector CT imaging of the abdomen and pelvis was performed using the standard protocol following bolus administration of intravenous contrast. CONTRAST:  63mL OMNIPAQUE IOHEXOL 300 MG/ML  SOLN COMPARISON:  12/12/2019 FINDINGS: Lower chest: Lung base fibrosis, stable.  No acute findings. Hepatobiliary: No focal liver abnormality is seen. No gallstones, gallbladder wall thickening, or biliary dilatation. Pancreas: Unremarkable. No pancreatic ductal dilatation or surrounding inflammatory changes. Spleen: Normal in size without focal abnormality. Adrenals/Urinary Tract: No adrenal mass. Kidneys normal in size,  orientation and position with symmetric enhancement and excretion. 1.8 cm upper pole left renal cyst. 7 mm anterior midpole right renal cyst. No other masses, no stones and no hydronephrosis. Normal ureters. Normal bladder. Fat containing area in the perinephric fat adjacent to the superolateral right kidney, likely an area of fat necrosis, unchanged. Stomach/Bowel: Poorly defined masslike abnormality contiguous with the anterior wall of the cecum, consistent with a poorly defined abscess or phlegmon, currently measuring 3.1 x 2.9 x 3.7 cm, increased from 2.6 x 2.3 x 3.2 cm on the prior exam. There is thickening of the wall of the cecum  which is similar to the prior study. The right transverse colon abuts the area of inflammation arising from the anterior cecum. There are numerous colonic diverticula which predominate on the left. No other evidence of diverticulitis. No other areas of wall thickening or inflammation. Stomach and small bowel are unremarkable. Vascular/Lymphatic: Aortic atherosclerosis. No enlarged lymph nodes. Reproductive: Status post hysterectomy. No adnexal masses. Other: No ascites. There is subcutaneous soft tissue air the lower, new from the prior exam. Anterior abdominal wall Musculoskeletal: No acute fracture. No osteoblastic or osteolytic lesions. IMPRESSION: 1. The poorly defined collection or phlegmon, contiguous with the anterior wall of the cecum, has mildly increased in size, but remains poorly defined, and may not be drainable. There are surrounding inflammatory changes and more diffuse wall thickening of the cecum, which is similar to the prior study. 2. No other acute abnormality within the abdomen or pelvis. No free air. No ascites. Electronically Signed   By: Lajean Manes M.D.   On: 12/15/2019 13:27   CT ABDOMEN PELVIS W CONTRAST  Result Date: 12/12/2019 CLINICAL DATA:  Lower abdominal pain since last night, multiple bouts of diarrhea, history of breast cancer, diverticulitis,  appendectomy, hysterectomy EXAM: CT ABDOMEN AND PELVIS WITH CONTRAST TECHNIQUE: Multidetector CT imaging of the abdomen and pelvis was performed using the standard protocol following bolus administration of intravenous contrast. CONTRAST:  162mL OMNIPAQUE IOHEXOL 300 MG/ML  SOLN COMPARISON:  CT abdomen pelvis 04/29/2017 FINDINGS: Lower chest: Extensive subpleural reticular changes throughout the lung bases likely reflecting some chronic interstitial lung disease, previously characterized as likely UIP. Overall extent of disease in the visible bases is similar to minimally increased. Normal heart size. No pericardial effusion. Coronary atherosclerosis. Mitral annular calcification. Hepatobiliary: No focal liver abnormality is seen. No gallstones, gallbladder wall thickening, or biliary dilatation. Pancreas: Unremarkable. No pancreatic ductal dilatation or surrounding inflammatory changes. Spleen: Normal in size without focal abnormality. Adrenals/Urinary Tract: Normal adrenal glands continued stability of a retroperitoneal lesion adjacent the right renal fossa. Kidneys enhance and excrete symmetrically. Few simple appearing renal cysts. No concerning renal lesions. No urolithiasis or hydronephrosis. Normal bladder. Suspect pelvic floor laxity with cystocele. Stomach/Bowel: Small sliding-type hiatal hernia. Esophagus, stomach and duodenum are otherwise unremarkable. Mild thickening at the terminal ileum with more pronounced circumferential thickening and phlegmonous change centered upon the cecum as well as a an outpouching along the anterior margin which appears to reflect a contained volume of air and fluid with extensive surrounding inflammatory phlegmonous change as well as some associated reactive thickening of the slightly redundant proximal transverse colon which courses in the immediate vicinity. This may arise from right-sided diverticula seen in this vicinity on comparison CT more distal colonic segments are  free of thickening or dilatation. Scattered colonic diverticula throughout much of the distal colon are unremarkable. Vascular/Lymphatic: Atherosclerotic plaque within aorta and branch vessels. Focal fusiform ectasia of the infrarenal abdominal aorta up to 2 cm. No other aneurysm or ectasia. Reactive adenopathy in the abdomen. No pathologically enlarged abdominopelvic nodes. Reproductive: Uterus is surgically absent. No concerning adnexal lesions. Other: Contained air and fluid about the cecum as above with surrounding phlegmon. No free air or fluid in the abdomen or pelvis. No bowel containing hernias. Musculoskeletal: Stable superior endplate deformity Z61 with superimposed Schmorl's node. Stepwise retrolisthesis L2-L5 is unchanged from prior. Multilevel degenerative changes are present in the imaged portions of the spine. Additional degenerative changes in the SI joints and hips. No acute osseous abnormality or suspicious osseous lesion. IMPRESSION: 1. Contained volume  of air and fluid about the cecum with extensive surrounding inflammatory phlegmonous change as well as some associated reactive thickening of the slightly redundant proximal transverse colon which courses in the immediate vicinity. Findings are favored to represent a perforated diverticulitis with contained collection/abscess. 2. Extensive subpleural reticular changes throughout the lung bases likely reflecting some chronic interstitial lung disease, previously characterized as likely UIP. Overall extent of disease in the visible bases is similar to minimally increased. 3. Small sliding-type hiatal hernia. 4. Suspect pelvic floor laxity with cystocele. 5. Stable remote compression deformity T11 6. Aortic Atherosclerosis (ICD10-I70.0). These results were called by telephone at the time of interpretation on 12/12/2019 at 3:29 pm to provider JOSHUA LONG , who verbally acknowledged these results. Electronically Signed   By: Lovena Le M.D.   On:  12/12/2019 15:29   DG Chest Portable 1 View  Result Date: 12/12/2019 CLINICAL DATA:  Upper abdominal pain, fever EXAM: PORTABLE CHEST 1 VIEW COMPARISON:  03/22/2016 FINDINGS: Diffuse coarsened interstitial prominence throughout the lungs is similar prior study compatible with chronic lung disease/fibrosis. No acute confluent airspace opacity or effusion. Heart is normal size. Aortic atherosclerosis. No acute bony abnormality. IMPRESSION: Chronic lung disease/fibrosis.  No acute cardiopulmonary disease. Electronically Signed   By: Rolm Baptise M.D.   On: 12/12/2019 13:32    Orson Eva, DO  [facility]  If 7PM-7AM, please contact night-coverage [email or web address] Password [email or web address] 12/18/2019, 4:22 PM   LOS: 6 days

## 2019-12-19 DIAGNOSIS — E1169 Type 2 diabetes mellitus with other specified complication: Secondary | ICD-10-CM | POA: Diagnosis not present

## 2019-12-19 DIAGNOSIS — R319 Hematuria, unspecified: Secondary | ICD-10-CM | POA: Diagnosis not present

## 2019-12-19 DIAGNOSIS — M109 Gout, unspecified: Secondary | ICD-10-CM | POA: Diagnosis not present

## 2019-12-19 DIAGNOSIS — Z720 Tobacco use: Secondary | ICD-10-CM | POA: Diagnosis not present

## 2019-12-19 DIAGNOSIS — D62 Acute posthemorrhagic anemia: Secondary | ICD-10-CM | POA: Diagnosis not present

## 2019-12-19 DIAGNOSIS — Z7189 Other specified counseling: Secondary | ICD-10-CM | POA: Diagnosis not present

## 2019-12-19 DIAGNOSIS — N390 Urinary tract infection, site not specified: Secondary | ICD-10-CM | POA: Diagnosis not present

## 2019-12-19 DIAGNOSIS — R6520 Severe sepsis without septic shock: Secondary | ICD-10-CM | POA: Diagnosis present

## 2019-12-19 DIAGNOSIS — Z7401 Bed confinement status: Secondary | ICD-10-CM | POA: Diagnosis not present

## 2019-12-19 DIAGNOSIS — A0472 Enterocolitis due to Clostridium difficile, not specified as recurrent: Secondary | ICD-10-CM | POA: Diagnosis not present

## 2019-12-19 DIAGNOSIS — I871 Compression of vein: Secondary | ICD-10-CM | POA: Diagnosis not present

## 2019-12-19 DIAGNOSIS — Z20822 Contact with and (suspected) exposure to covid-19: Secondary | ICD-10-CM | POA: Diagnosis not present

## 2019-12-19 DIAGNOSIS — K219 Gastro-esophageal reflux disease without esophagitis: Secondary | ICD-10-CM | POA: Diagnosis not present

## 2019-12-19 DIAGNOSIS — Z79899 Other long term (current) drug therapy: Secondary | ICD-10-CM | POA: Diagnosis not present

## 2019-12-19 DIAGNOSIS — K5792 Diverticulitis of intestine, part unspecified, without perforation or abscess without bleeding: Secondary | ICD-10-CM | POA: Diagnosis not present

## 2019-12-19 DIAGNOSIS — G629 Polyneuropathy, unspecified: Secondary | ICD-10-CM | POA: Diagnosis not present

## 2019-12-19 DIAGNOSIS — C7931 Secondary malignant neoplasm of brain: Secondary | ICD-10-CM | POA: Diagnosis not present

## 2019-12-19 DIAGNOSIS — E119 Type 2 diabetes mellitus without complications: Secondary | ICD-10-CM | POA: Diagnosis not present

## 2019-12-19 DIAGNOSIS — Z8673 Personal history of transient ischemic attack (TIA), and cerebral infarction without residual deficits: Secondary | ICD-10-CM | POA: Diagnosis not present

## 2019-12-19 DIAGNOSIS — K529 Noninfective gastroenteritis and colitis, unspecified: Secondary | ICD-10-CM | POA: Diagnosis not present

## 2019-12-19 DIAGNOSIS — E1142 Type 2 diabetes mellitus with diabetic polyneuropathy: Secondary | ICD-10-CM | POA: Diagnosis not present

## 2019-12-19 DIAGNOSIS — Z7984 Long term (current) use of oral hypoglycemic drugs: Secondary | ICD-10-CM | POA: Diagnosis not present

## 2019-12-19 DIAGNOSIS — A419 Sepsis, unspecified organism: Secondary | ICD-10-CM | POA: Diagnosis not present

## 2019-12-19 DIAGNOSIS — K30 Functional dyspepsia: Secondary | ICD-10-CM | POA: Diagnosis not present

## 2019-12-19 DIAGNOSIS — Z7982 Long term (current) use of aspirin: Secondary | ICD-10-CM | POA: Diagnosis not present

## 2019-12-19 DIAGNOSIS — M6281 Muscle weakness (generalized): Secondary | ICD-10-CM | POA: Diagnosis not present

## 2019-12-19 DIAGNOSIS — Z66 Do not resuscitate: Secondary | ICD-10-CM | POA: Diagnosis not present

## 2019-12-19 DIAGNOSIS — Z9011 Acquired absence of right breast and nipple: Secondary | ICD-10-CM | POA: Diagnosis not present

## 2019-12-19 DIAGNOSIS — K922 Gastrointestinal hemorrhage, unspecified: Secondary | ICD-10-CM | POA: Diagnosis not present

## 2019-12-19 DIAGNOSIS — J449 Chronic obstructive pulmonary disease, unspecified: Secondary | ICD-10-CM | POA: Diagnosis not present

## 2019-12-19 DIAGNOSIS — J9601 Acute respiratory failure with hypoxia: Secondary | ICD-10-CM | POA: Diagnosis not present

## 2019-12-19 DIAGNOSIS — Z86711 Personal history of pulmonary embolism: Secondary | ICD-10-CM | POA: Diagnosis not present

## 2019-12-19 DIAGNOSIS — K5720 Diverticulitis of large intestine with perforation and abscess without bleeding: Secondary | ICD-10-CM | POA: Diagnosis not present

## 2019-12-19 DIAGNOSIS — A4159 Other Gram-negative sepsis: Secondary | ICD-10-CM | POA: Diagnosis not present

## 2019-12-19 DIAGNOSIS — R29898 Other symptoms and signs involving the musculoskeletal system: Secondary | ICD-10-CM | POA: Diagnosis not present

## 2019-12-19 DIAGNOSIS — I10 Essential (primary) hypertension: Secondary | ICD-10-CM | POA: Diagnosis not present

## 2019-12-19 DIAGNOSIS — C7801 Secondary malignant neoplasm of right lung: Secondary | ICD-10-CM | POA: Diagnosis not present

## 2019-12-19 DIAGNOSIS — R197 Diarrhea, unspecified: Secondary | ICD-10-CM | POA: Diagnosis not present

## 2019-12-19 DIAGNOSIS — L03113 Cellulitis of right upper limb: Secondary | ICD-10-CM | POA: Diagnosis not present

## 2019-12-19 DIAGNOSIS — Z853 Personal history of malignant neoplasm of breast: Secondary | ICD-10-CM | POA: Diagnosis not present

## 2019-12-19 DIAGNOSIS — D649 Anemia, unspecified: Secondary | ICD-10-CM | POA: Diagnosis not present

## 2019-12-19 DIAGNOSIS — E785 Hyperlipidemia, unspecified: Secondary | ICD-10-CM | POA: Diagnosis not present

## 2019-12-19 DIAGNOSIS — Z7951 Long term (current) use of inhaled steroids: Secondary | ICD-10-CM | POA: Diagnosis not present

## 2019-12-19 DIAGNOSIS — C180 Malignant neoplasm of cecum: Secondary | ICD-10-CM | POA: Diagnosis present

## 2019-12-19 DIAGNOSIS — Z90710 Acquired absence of both cervix and uterus: Secondary | ICD-10-CM | POA: Diagnosis not present

## 2019-12-19 DIAGNOSIS — R300 Dysuria: Secondary | ICD-10-CM | POA: Diagnosis not present

## 2019-12-19 DIAGNOSIS — F17210 Nicotine dependence, cigarettes, uncomplicated: Secondary | ICD-10-CM | POA: Diagnosis present

## 2019-12-19 LAB — BASIC METABOLIC PANEL
Anion gap: 10 (ref 5–15)
BUN: 5 mg/dL — ABNORMAL LOW (ref 8–23)
CO2: 20 mmol/L — ABNORMAL LOW (ref 22–32)
Calcium: 8.1 mg/dL — ABNORMAL LOW (ref 8.9–10.3)
Chloride: 106 mmol/L (ref 98–111)
Creatinine, Ser: 0.32 mg/dL — ABNORMAL LOW (ref 0.44–1.00)
GFR calc Af Amer: >60 mL/min (ref >60)
GFR calc non Af Amer: >60 mL/min (ref >60)
Glucose, Bld: 107 mg/dL — ABNORMAL HIGH (ref 70–99)
Potassium: 3.5 mmol/L (ref 3.5–5.1)
Sodium: 136 mmol/L (ref 135–145)

## 2019-12-19 LAB — GLUCOSE, CAPILLARY
Glucose-Capillary: 114 mg/dL — ABNORMAL HIGH (ref 70–99)
Glucose-Capillary: 130 mg/dL — ABNORMAL HIGH (ref 70–99)
Glucose-Capillary: 121 mg/dL — ABNORMAL HIGH (ref 70–99)

## 2019-12-19 LAB — SARS CORONAVIRUS 2 BY RT PCR (HOSPITAL ORDER, PERFORMED IN ~~LOC~~ HOSPITAL LAB): SARS Coronavirus 2: NEGATIVE

## 2019-12-19 LAB — MAGNESIUM: Magnesium: 1.6 mg/dL — ABNORMAL LOW (ref 1.7–2.4)

## 2019-12-19 MED ORDER — POTASSIUM CHLORIDE CRYS ER 20 MEQ PO TBCR
20.0000 meq | EXTENDED_RELEASE_TABLET | Freq: Once | ORAL | Status: AC
  Administered 2019-12-19: 20 meq via ORAL
  Filled 2019-12-19: qty 1

## 2019-12-19 MED ORDER — MAGNESIUM SULFATE 2 GM/50ML IV SOLN
2.0000 g | Freq: Once | INTRAVENOUS | Status: AC
  Administered 2019-12-19: 2 g via INTRAVENOUS
  Filled 2019-12-19: qty 50

## 2019-12-19 MED ORDER — AMOXICILLIN-POT CLAVULANATE 875-125 MG PO TABS: 1.0000 | ORAL_TABLET | Freq: Two times a day (BID) | ORAL | 0 refills | Status: AC

## 2019-12-19 MED ORDER — GABAPENTIN 600 MG PO TABS: 600.0000 mg | ORAL_TABLET | Freq: Three times a day (TID) | ORAL | 0 refills | Status: AC

## 2019-12-19 NOTE — TOC Transition Note (Signed)
Transition of Care [facility]) - CM/SW Discharge Note   Patient Details  Name: [name] MRN: [id number] Date of Birth: 1940-08-10  [facility] CM/SW Contact:  Boneta Lucks, RN Phone Number: 12/19/2019, 3:55 PM   Clinical Narrative:   Delay has been INS auth. TOC has been updating [name]- grandson and patient daily. [facility] just received INS AUTH.  RN has done rapid COVID, waiting to call EMS.  Clinicals faxed to [place] faxed to [employer] at [facility].     Final next level of care: Skilled Nursing Facility Barriers to Discharge: Barriers Resolved   Patient Goals and CMS Choice Patient states their goals for this hospitalization and ongoing recovery are:: go home if possible CMS Medicare.gov Compare Post Acute Care list provided to:: Patient Choice offered to / list presented to : Patient  Discharge Placement              Patient chooses bed at: [facility] Patient to be transferred to facility by: EMS Name of family member notified: [name] Patient and family notified of of transfer: 12/19/19  Discharge Plan and Services In-house Referral: Clinical Social Work   Post Acute Care Choice: [place]                 Readmission Risk Interventions Readmission Risk Prevention Plan 12/15/2019  Transportation Screening Complete  PCP or Specialist Appt within 5-7 Days Complete  Medication Review (RN CM) Complete  Some recent data might be hidden

## 2019-12-19 NOTE — Discharge Summary (Addendum)
Physician Discharge Summary  [name] DVV:[id number] DOB: [birth year] DOA: 12/12/2019  PCP: Rosita Fire, MD  Admit date: 12/12/2019 Discharge date: 12/19/2019  Admitted From: Home Disposition:  Home   Recommendations for Outpatient Follow-up:  1. Follow up with PCP in 1-2 weeks 2. Please obtain BMP/CBC in one week     Discharge Condition: Stable CODE STATUS: FULL Diet recommendation: Heart Healthy   Brief/Interim Summary: 79 y.o.female,has medical history of hypertension, hyperlipidemia, diabetes mellitus, tobacco abuse, patient presents to ED secondary to complaints of abdominal pain, reports pain started 6:00 am on 12/12/19 with nonbloody loose stools with subjective f/c.  - in EDpatient had temp of 100.8, blood pressure on the lower side (96/54), she had leukocytosis when, elevated lactic acid at 2.4, CT abdomen pelvis significant for cecal diverticulitis with early perforation, patient was started on IV Zosyn, general surgery was consulted and [facility] hospitalist consulted to admit.  She was initially placed on bowel rest and started on IVF.  She improved clinically and her diet was advanced gradually which she tolerated.  She was switched to po abx and remained clinically stable.  She will d/c with amox/clav x 7 more days to complete 14 days of tx  Discharge Diagnoses:  sepsis secondary to diverticulitis of large intestine with perforation  -Sepsisphysiology resolved. -She continuesto reports mildabdominal discomfort;butrepeat 12/15/19  CT scan abd demonstrated stability and no need for surgical intervention or abscess drainagecurrently. -Antibiotic has been transitioned to oral regimen with intention to complete 7 more days at time of discharge. -No fever, no WBCsand tolerating oral medications and diet.. -Continue as neededacetaminophen for pain control.  benign essential HTN -Blood pressurestable  -follow heart healthy diet -Resume home antihypertensive  regimen-- metoprolol  type 2 diabeteswith neuropathy -Oral hypoglycemic regimen -Continue modified carbohydrate diet -Continue Neurontin. -12/12/19 A1c--5.9 -resume metformin and tradjenta at d/c  history of CVA -Nonewfocal deficitsappreciated. -Continuefull doseaspirin for secondary prevention -Continue risk factor modifications.  hyperlipidemia -Continue statins.  tobacco abuse -Cessation counseling provided -Patient has declined nicotine patch at discharge.  anxiety/mood disorder -Stable mood. -Continue nightly low-dose Seroquel. -Oriented x3currently.  physical deconditioning -PT has evaluated patient and recommended skilled nursing facility at discharge. -Patient medically stable and ready for skilled nursing rehabilitation and further care.    Discharge Instructions  Discharge Instructions    Diet - low sodium heart healthy   Complete by: As directed    Discharge instructions   Complete by: As directed    Patient follow-up with general surgery as instructed Take medications as prescribed Maintain adequate hydration Increase fiber ingestion in your diet. -Complete oral antibiotics as prescribed. Repeat basic metabolic panel in 1 week to follow electrolytes and renal function. Stop smoking.   Increase activity slowly   Complete by: As directed      Allergies as of 12/19/2019      Reactions   Pollen Extract Other (See Comments)   Runny nose, watery eyes and ears get stopped up   Red Dye       Medication List    STOP taking these medications   amLODipine 5 MG tablet Commonly known as: NORVASC   diphenoxylate-atropine 2.5-0.025 MG tablet Commonly known as: LOMOTIL   Mitigare 0.6 MG Caps Generic drug: Colchicine     TAKE these medications   acetaminophen 650 MG CR tablet Commonly known as: TYLENOL Take 650 mg by mouth as needed for pain.   albuterol 108 (90 Base) MCG/ACT inhaler Commonly known as: VENTOLIN HFA Inhale 1 puff  into  the lungs every 6 (six) hours as needed for wheezing or shortness of breath.   amoxicillin-clavulanate 875-125 MG tablet Commonly known as: AUGMENTIN Take 1 tablet by mouth every 12 (twelve) hours for 7 days.   aspirin EC 325 MG tablet Take 325 mg by mouth daily.   calcium-vitamin D 500-200 MG-UNIT Tabs tablet Commonly known as: OSCAL WITH D Take 1 tablet by mouth in the morning, at noon, and at bedtime.   ferrous sulfate 325 (65 FE) MG tablet Take 325 mg by mouth daily with breakfast.   fluticasone 50 MCG/ACT nasal spray Commonly known as: FLONASE Place 2 sprays into both nostrils as needed.   gabapentin 600 MG tablet Commonly known as: NEURONTIN Take 600 mg by mouth 3 (three) times daily.   lovastatin 40 MG tablet Commonly known as: MEVACOR Take 40 mg by mouth at bedtime.   metFORMIN 500 MG tablet Commonly known as: GLUCOPHAGE Take 500 mg by mouth 2 (two) times daily.   metoprolol tartrate 25 MG tablet Commonly known as: LOPRESSOR Take 0.5 tablets (12.5 mg total) by mouth 2 (two) times daily. What changed:   how much to take  when to take this   metroNIDAZOLE 0.75 % vaginal gel Commonly known as: METROGEL Place 1 Applicatorful vaginally once a week. one applicatorful to vagina at bedtime weekly , as needed for vaginal discharge with pessary   naphazoline-glycerin 0.012-0.2 % Soln Commonly known as: CLEAR EYES REDNESS Place 2 drops into both eyes daily as needed for irritation.   omeprazole 20 MG capsule Commonly known as: PRILOSEC Take 1 capsule by mouth daily.   QUEtiapine 25 MG tablet Commonly known as: SEROQUEL Take 0.5 tablets (12.5 mg total) by mouth at bedtime.   Symbicort 160-4.5 MCG/ACT inhaler Generic drug: budesonide-formoterol Inhale 2 puffs into the lungs as needed.   tiotropium 18 MCG inhalation capsule Commonly known as: SPIRIVA Place 18 mcg into inhaler and inhale daily as needed.   Tradjenta 5 MG Tabs tablet Generic drug:  linagliptin Take 5 mg by mouth daily.       Contact information for follow-up providers    Virl Cagey, MD Follow up on 01/01/2020.   Specialty: General Surgery Why: follow up after perforated diverticulitis  Contact information: [address] [place] [facility] [address] [phone]        Rosita Fire, MD. Schedule an appointment as soon as possible for a visit in 2 week(s).   Specialty: Internal Medicine Why: After discharge from the skilled nursing facility. Contact information: [address] [place] [address] [phone]            Contact information for after-discharge care    [facility] .   Service: Skilled Nursing Contact information: [place] [address] [phone]                 Allergies  Allergen Reactions  . Pollen Extract Other (See Comments)    Runny nose, watery eyes and ears get stopped up  . Red Dye     Consultations:  General surgery   Procedures/Studies: CT ABDOMEN PELVIS W CONTRAST  Result Date: 12/15/2019 CLINICAL DATA:  Follow-up for diverticulitis. EXAM: CT ABDOMEN AND PELVIS WITH CONTRAST TECHNIQUE: Multidetector CT imaging of the abdomen and pelvis was performed using the standard protocol following bolus administration of intravenous contrast. CONTRAST:  84mL OMNIPAQUE IOHEXOL 300 MG/ML  SOLN COMPARISON:  12/12/2019 FINDINGS: Lower chest: Lung base fibrosis, stable.  No  acute findings. Hepatobiliary: No focal liver abnormality is seen. No gallstones, gallbladder wall thickening, or biliary dilatation. Pancreas: Unremarkable. No pancreatic ductal dilatation or surrounding inflammatory changes. Spleen: Normal in size without focal abnormality. Adrenals/Urinary Tract: No adrenal mass. Kidneys normal in size, orientation and position with symmetric enhancement and excretion. 1.8 cm upper pole left renal cyst. 7 mm anterior midpole right renal cyst.  No other masses, no stones and no hydronephrosis. Normal ureters. Normal bladder. Fat containing area in the perinephric fat adjacent to the superolateral right kidney, likely an area of fat necrosis, unchanged. Stomach/Bowel: Poorly defined masslike abnormality contiguous with the anterior wall of the cecum, consistent with a poorly defined abscess or phlegmon, currently measuring 3.1 x 2.9 x 3.7 cm, increased from 2.6 x 2.3 x 3.2 cm on the prior exam. There is thickening of the wall of the cecum which is similar to the prior study. The right transverse colon abuts the area of inflammation arising from the anterior cecum. There are numerous colonic diverticula which predominate on the left. No other evidence of diverticulitis. No other areas of wall thickening or inflammation. Stomach and small bowel are unremarkable. Vascular/Lymphatic: Aortic atherosclerosis. No enlarged lymph nodes. Reproductive: Status post hysterectomy. No adnexal masses. Other: No ascites. There is subcutaneous soft tissue air the lower, new from the prior exam. Anterior abdominal wall Musculoskeletal: No acute fracture. No osteoblastic or osteolytic lesions. IMPRESSION: 1. The poorly defined collection or phlegmon, contiguous with the anterior wall of the cecum, has mildly increased in size, but remains poorly defined, and may not be drainable. There are surrounding inflammatory changes and more diffuse wall thickening of the cecum, which is similar to the prior study. 2. No other acute abnormality within the abdomen or pelvis. No free air. No ascites. Electronically Signed   By: Lajean Manes M.D.   On: 12/15/2019 13:27   CT ABDOMEN PELVIS W CONTRAST  Result Date: 12/12/2019 CLINICAL DATA:  Lower abdominal pain since last night, multiple bouts of diarrhea, history of breast cancer, diverticulitis, appendectomy, hysterectomy EXAM: CT ABDOMEN AND PELVIS WITH CONTRAST TECHNIQUE: Multidetector CT imaging of the abdomen and pelvis was  performed using the standard protocol following bolus administration of intravenous contrast. CONTRAST:  157mL OMNIPAQUE IOHEXOL 300 MG/ML  SOLN COMPARISON:  CT abdomen pelvis 04/29/2017 FINDINGS: Lower chest: Extensive subpleural reticular changes throughout the lung bases likely reflecting some chronic interstitial lung disease, previously characterized as likely UIP. Overall extent of disease in the visible bases is similar to minimally increased. Normal heart size. No pericardial effusion. Coronary atherosclerosis. Mitral annular calcification. Hepatobiliary: No focal liver abnormality is seen. No gallstones, gallbladder wall thickening, or biliary dilatation. Pancreas: Unremarkable. No pancreatic ductal dilatation or surrounding inflammatory changes. Spleen: Normal in size without focal abnormality. Adrenals/Urinary Tract: Normal adrenal glands continued stability of a retroperitoneal lesion adjacent the right renal fossa. Kidneys enhance and excrete symmetrically. Few simple appearing renal cysts. No concerning renal lesions. No urolithiasis or hydronephrosis. Normal bladder. Suspect pelvic floor laxity with cystocele. Stomach/Bowel: Small sliding-type hiatal hernia. Esophagus, stomach and duodenum are otherwise unremarkable. Mild thickening at the terminal ileum with more pronounced circumferential thickening and phlegmonous change centered upon the cecum as well as a an outpouching along the anterior margin which appears to reflect a contained volume of air and fluid with extensive surrounding inflammatory phlegmonous change as well as some associated reactive thickening of the slightly redundant proximal transverse colon which courses in the immediate vicinity. This may arise from right-sided diverticula seen in  this vicinity on comparison CT more distal colonic segments are free of thickening or dilatation. Scattered colonic diverticula throughout much of the distal colon are unremarkable.  Vascular/Lymphatic: Atherosclerotic plaque within aorta and branch vessels. Focal fusiform ectasia of the infrarenal abdominal aorta up to 2 cm. No other aneurysm or ectasia. Reactive adenopathy in the abdomen. No pathologically enlarged abdominopelvic nodes. Reproductive: Uterus is surgically absent. No concerning adnexal lesions. Other: Contained air and fluid about the cecum as above with surrounding phlegmon. No free air or fluid in the abdomen or pelvis. No bowel containing hernias. Musculoskeletal: Stable superior endplate deformity W29 with superimposed Schmorl's node. Stepwise retrolisthesis L2-L5 is unchanged from prior. Multilevel degenerative changes are present in the imaged portions of the spine. Additional degenerative changes in the SI joints and hips. No acute osseous abnormality or suspicious osseous lesion. IMPRESSION: 1. Contained volume of air and fluid about the cecum with extensive surrounding inflammatory phlegmonous change as well as some associated reactive thickening of the slightly redundant proximal transverse colon which courses in the immediate vicinity. Findings are favored to represent a perforated diverticulitis with contained collection/abscess. 2. Extensive subpleural reticular changes throughout the lung bases likely reflecting some chronic interstitial lung disease, previously characterized as likely UIP. Overall extent of disease in the visible bases is similar to minimally increased. 3. Small sliding-type hiatal hernia. 4. Suspect pelvic floor laxity with cystocele. 5. Stable remote compression deformity T11 6. Aortic Atherosclerosis (ICD10-I70.0). These results were called by telephone at the time of interpretation on 12/12/2019 at 3:29 pm to provider JOSHUA LONG , who verbally acknowledged these results. Electronically Signed   By: Lovena Le M.D.   On: 12/12/2019 15:29   DG Chest Portable 1 View  Result Date: 12/12/2019 CLINICAL DATA:  Upper abdominal pain, fever EXAM:  PORTABLE CHEST 1 VIEW COMPARISON:  03/22/2016 FINDINGS: Diffuse coarsened interstitial prominence throughout the lungs is similar prior study compatible with chronic lung disease/fibrosis. No acute confluent airspace opacity or effusion. Heart is normal size. Aortic atherosclerosis. No acute bony abnormality. IMPRESSION: Chronic lung disease/fibrosis.  No acute cardiopulmonary disease. Electronically Signed   By: Rolm Baptise M.D.   On: 12/12/2019 13:32        Discharge Exam: Vitals:   12/19/19 0800 12/19/19 1458  BP:  (!) 134/56  Pulse:  85  Resp:  19  Temp:  98 F (36.7 C)  SpO2: 97% 100%   Vitals:   12/18/19 2059 12/19/19 0549 12/19/19 0800 12/19/19 1458  BP: (!) 157/73 (!) 141/54  (!) 134/56  Pulse: 95 84  85  Resp: 16   19  Temp: 99.5 F (37.5 C) 98.8 F (37.1 C)  98 F (36.7 C)  TempSrc: Oral Oral    SpO2: 97% 96% 97% 100%  Weight:      Height:        General: Pt is alert, awake, not in acute distress Cardiovascular: RRR, S1/S2 +, no rubs, no gallops Respiratory: bibasilar rales. No wheeze Abdominal: Soft, NT, ND, bowel sounds + Extremities: no edema, no cyanosis   The results of significant diagnostics from this hospitalization (including imaging, microbiology, ancillary and laboratory) are listed below for reference.    Significant Diagnostic Studies: CT ABDOMEN PELVIS W CONTRAST  Result Date: 12/15/2019 CLINICAL DATA:  Follow-up for diverticulitis. EXAM: CT ABDOMEN AND PELVIS WITH CONTRAST TECHNIQUE: Multidetector CT imaging of the abdomen and pelvis was performed using the standard protocol following bolus administration of intravenous contrast. CONTRAST:  59mL OMNIPAQUE IOHEXOL 300 MG/ML  SOLN COMPARISON:  12/12/2019 FINDINGS: Lower chest: Lung base fibrosis, stable.  No acute findings. Hepatobiliary: No focal liver abnormality is seen. No gallstones, gallbladder wall thickening, or biliary dilatation. Pancreas: Unremarkable. No pancreatic ductal dilatation or  surrounding inflammatory changes. Spleen: Normal in size without focal abnormality. Adrenals/Urinary Tract: No adrenal mass. Kidneys normal in size, orientation and position with symmetric enhancement and excretion. 1.8 cm upper pole left renal cyst. 7 mm anterior midpole right renal cyst. No other masses, no stones and no hydronephrosis. Normal ureters. Normal bladder. Fat containing area in the perinephric fat adjacent to the superolateral right kidney, likely an area of fat necrosis, unchanged. Stomach/Bowel: Poorly defined masslike abnormality contiguous with the anterior wall of the cecum, consistent with a poorly defined abscess or phlegmon, currently measuring 3.1 x 2.9 x 3.7 cm, increased from 2.6 x 2.3 x 3.2 cm on the prior exam. There is thickening of the wall of the cecum which is similar to the prior study. The right transverse colon abuts the area of inflammation arising from the anterior cecum. There are numerous colonic diverticula which predominate on the left. No other evidence of diverticulitis. No other areas of wall thickening or inflammation. Stomach and small bowel are unremarkable. Vascular/Lymphatic: Aortic atherosclerosis. No enlarged lymph nodes. Reproductive: Status post hysterectomy. No adnexal masses. Other: No ascites. There is subcutaneous soft tissue air the lower, new from the prior exam. Anterior abdominal wall Musculoskeletal: No acute fracture. No osteoblastic or osteolytic lesions. IMPRESSION: 1. The poorly defined collection or phlegmon, contiguous with the anterior wall of the cecum, has mildly increased in size, but remains poorly defined, and may not be drainable. There are surrounding inflammatory changes and more diffuse wall thickening of the cecum, which is similar to the prior study. 2. No other acute abnormality within the abdomen or pelvis. No free air. No ascites. Electronically Signed   By: Lajean Manes M.D.   On: 12/15/2019 13:27   CT ABDOMEN PELVIS W  CONTRAST  Result Date: 12/12/2019 CLINICAL DATA:  Lower abdominal pain since last night, multiple bouts of diarrhea, history of breast cancer, diverticulitis, appendectomy, hysterectomy EXAM: CT ABDOMEN AND PELVIS WITH CONTRAST TECHNIQUE: Multidetector CT imaging of the abdomen and pelvis was performed using the standard protocol following bolus administration of intravenous contrast. CONTRAST:  157mL OMNIPAQUE IOHEXOL 300 MG/ML  SOLN COMPARISON:  CT abdomen pelvis 04/29/2017 FINDINGS: Lower chest: Extensive subpleural reticular changes throughout the lung bases likely reflecting some chronic interstitial lung disease, previously characterized as likely UIP. Overall extent of disease in the visible bases is similar to minimally increased. Normal heart size. No pericardial effusion. Coronary atherosclerosis. Mitral annular calcification. Hepatobiliary: No focal liver abnormality is seen. No gallstones, gallbladder wall thickening, or biliary dilatation. Pancreas: Unremarkable. No pancreatic ductal dilatation or surrounding inflammatory changes. Spleen: Normal in size without focal abnormality. Adrenals/Urinary Tract: Normal adrenal glands continued stability of a retroperitoneal lesion adjacent the right renal fossa. Kidneys enhance and excrete symmetrically. Few simple appearing renal cysts. No concerning renal lesions. No urolithiasis or hydronephrosis. Normal bladder. Suspect pelvic floor laxity with cystocele. Stomach/Bowel: Small sliding-type hiatal hernia. Esophagus, stomach and duodenum are otherwise unremarkable. Mild thickening at the terminal ileum with more pronounced circumferential thickening and phlegmonous change centered upon the cecum as well as a an outpouching along the anterior margin which appears to reflect a contained volume of air and fluid with extensive surrounding inflammatory phlegmonous change as well as some associated reactive thickening of the slightly redundant proximal transverse  colon  which courses in the immediate vicinity. This may arise from right-sided diverticula seen in this vicinity on comparison CT more distal colonic segments are free of thickening or dilatation. Scattered colonic diverticula throughout much of the distal colon are unremarkable. Vascular/Lymphatic: Atherosclerotic plaque within aorta and branch vessels. Focal fusiform ectasia of the infrarenal abdominal aorta up to 2 cm. No other aneurysm or ectasia. Reactive adenopathy in the abdomen. No pathologically enlarged abdominopelvic nodes. Reproductive: Uterus is surgically absent. No concerning adnexal lesions. Other: Contained air and fluid about the cecum as above with surrounding phlegmon. No free air or fluid in the abdomen or pelvis. No bowel containing hernias. Musculoskeletal: Stable superior endplate deformity W11 with superimposed Schmorl's node. Stepwise retrolisthesis L2-L5 is unchanged from prior. Multilevel degenerative changes are present in the imaged portions of the spine. Additional degenerative changes in the SI joints and hips. No acute osseous abnormality or suspicious osseous lesion. IMPRESSION: 1. Contained volume of air and fluid about the cecum with extensive surrounding inflammatory phlegmonous change as well as some associated reactive thickening of the slightly redundant proximal transverse colon which courses in the immediate vicinity. Findings are favored to represent a perforated diverticulitis with contained collection/abscess. 2. Extensive subpleural reticular changes throughout the lung bases likely reflecting some chronic interstitial lung disease, previously characterized as likely UIP. Overall extent of disease in the visible bases is similar to minimally increased. 3. Small sliding-type hiatal hernia. 4. Suspect pelvic floor laxity with cystocele. 5. Stable remote compression deformity T11 6. Aortic Atherosclerosis (ICD10-I70.0). These results were called by telephone at the time of  interpretation on 12/12/2019 at 3:29 pm to provider JOSHUA LONG , who verbally acknowledged these results. Electronically Signed   By: Lovena Le M.D.   On: 12/12/2019 15:29   DG Chest Portable 1 View  Result Date: 12/12/2019 CLINICAL DATA:  Upper abdominal pain, fever EXAM: PORTABLE CHEST 1 VIEW COMPARISON:  03/22/2016 FINDINGS: Diffuse coarsened interstitial prominence throughout the lungs is similar prior study compatible with chronic lung disease/fibrosis. No acute confluent airspace opacity or effusion. Heart is normal size. Aortic atherosclerosis. No acute bony abnormality. IMPRESSION: Chronic lung disease/fibrosis.  No acute cardiopulmonary disease. Electronically Signed   By: Rolm Baptise M.D.   On: 12/12/2019 13:32     Microbiology: Recent Results (from the past 240 hour(s))  Culture, blood (routine x 2)     Status: None   Collection Time: 12/12/19  1:20 PM   Specimen: Right Antecubital; Blood  Result Value Ref Range Status   Specimen Description   Final    RIGHT ANTECUBITAL BOTTLES DRAWN AEROBIC AND ANAEROBIC   Special Requests Blood Culture adequate volume  Final   Culture   Final    NO GROWTH 5 DAYS Performed at [facility], [address] [place], [place] [address]    Report Status 12/17/2019 FINAL  Final  Culture, blood (routine x 2)     Status: None   Collection Time: 12/12/19  1:26 PM   Specimen: BLOOD RIGHT HAND  Result Value Ref Range Status   Specimen Description   Final    BLOOD RIGHT HAND BOTTLES DRAWN AEROBIC AND ANAEROBIC   Special Requests   Final    Blood Culture results may not be optimal due to an inadequate volume of blood received in culture bottles   Culture   Final    NO GROWTH 5 DAYS Performed at [facility], [address] [place], [place] [address]    Report Status 12/17/2019 FINAL  Final  SARS Coronavirus 2 by RT PCR (hospital order, performed in [facility] hospital lab) Nasopharyngeal Nasopharyngeal Swab     Status: None   Collection Time:  12/12/19  1:55 PM   Specimen: Nasopharyngeal Swab  Result Value Ref Range Status   SARS Coronavirus 2 NEGATIVE NEGATIVE Final    Comment: (NOTE) SARS-CoV-2 target nucleic acids are NOT DETECTED. The SARS-CoV-2 RNA is generally detectable in upper and lower respiratory specimens during the acute phase of infection. The lowest concentration of SARS-CoV-2 viral copies this assay can detect is 250 copies / mL. A negative result does not preclude SARS-CoV-2 infection and should not be used as the sole basis for treatment or other patient management decisions.  A negative result may occur with improper specimen collection / handling, submission of specimen other than nasopharyngeal swab, presence of viral mutation(s) within the areas targeted by this assay, and inadequate number of viral copies (<250 copies / mL). A negative result must be combined with clinical observations, patient history, and epidemiological information. Fact Sheet for Patients:   StrictlyIdeas.no Fact Sheet for Healthcare Providers: BankingDealers.co.za This test is not yet approved or cleared  by the [place] FDA and has been authorized for detection and/or diagnosis of SARS-CoV-2 by FDA under an Emergency Use Authorization (EUA).  This EUA will remain in effect (meaning this test can be used) for the duration of the COVID-19 declaration under Section 564(b)(1) of the Act, 21 U.S.C. section 360bbb-3(b)(1), unless the authorization is terminated or revoked sooner. Performed at [facility], [address] [place], [place] [address]      Labs: Basic Metabolic Panel: Recent Labs  Lab 12/13/19 0735 12/13/19 0735 12/14/19 0640 12/19/19 0501  NA 134*  --  133* 136  K 3.7   < > 4.0 3.5  CL 101  --  104 106  CO2 25  --  23 20*  GLUCOSE 135*  --  133* 107*  BUN 11  --  9 5*  CREATININE 0.64  --  0.55 0.32*  CALCIUM 7.6*  --  7.6* 8.1*  MG  --   --  2.0 1.6*    < > = values in this interval not displayed.   Liver Function Tests: No results for input(s): AST, ALT, ALKPHOS, BILITOT, PROT, ALBUMIN in the last 168 hours. No results for input(s): LIPASE, AMYLASE in the last 168 hours. No results for input(s): AMMONIA in the last 168 hours. CBC: Recent Labs  Lab 12/13/19 0735 12/14/19 0640 12/15/19 0606  WBC 22.1* 12.9* 8.4  NEUTROABS  --   --  7.1  HGB 11.8* 11.9* 12.6  HCT 37.6 37.6 39.8  MCV 93.8 94.0 93.9  PLT 157 148* 155   Cardiac Enzymes: No results for input(s): CKTOTAL, CKMB, CKMBINDEX, TROPONINI in the last 168 hours. BNP: Invalid input(s): POCBNP CBG: Recent Labs  Lab 12/18/19 1157 12/18/19 1541 12/18/19 2057 12/19/19 0802 12/19/19 1119  GLUCAP 121* 141* 134* 114* 130*    Time coordinating discharge:  36 minutes  Signed:  Orson Eva, DO [facility] Pager: [phone] 12/19/2019, 3:41 PM

## 2019-12-19 NOTE — Care Management Important Message (Signed)
Important Message  Patient Details  Name: [name] MRN: [id number] Date of Birth: [birth year]   Medicare Important Message Given:  Yes     Tommy Medal 12/19/2019, 11:47 AM

## 2019-12-19 NOTE — Progress Notes (Signed)
Report called to [facility] facility.  Patient transported to facility via EMS in stable condition.

## 2019-12-22 ENCOUNTER — Other Ambulatory Visit: Payer: Self-pay

## 2019-12-22 DIAGNOSIS — K30 Functional dyspepsia: Secondary | ICD-10-CM | POA: Diagnosis not present

## 2019-12-22 DIAGNOSIS — K5792 Diverticulitis of intestine, part unspecified, without perforation or abscess without bleeding: Secondary | ICD-10-CM | POA: Diagnosis not present

## 2019-12-22 DIAGNOSIS — D649 Anemia, unspecified: Secondary | ICD-10-CM | POA: Diagnosis not present

## 2019-12-22 DIAGNOSIS — M109 Gout, unspecified: Secondary | ICD-10-CM | POA: Diagnosis not present

## 2019-12-22 NOTE — Patient Outreach (Signed)
[facility]) Care Management  12/22/2019  ARIBA LEHNEN Jun 27, 1941 [id number]     Transition of Care Referral  Referral Date: 12/22/2019 Referral Source: [facility] Discharge Report Date of Admission: Diagnosis: Date of Discharge: 12/19/2019 Facility: [place]: [facility]   Referral received. Upon chart review noted that patient discharged from hospital to [facility].     Plan: RN CM will close case at this time.    Enzo Montgomery, RN,BSN,CCM [facility] Telephonic Care Management Coordinator Direct Phone: [phone] Toll Free: [phone] Fax: [phone]

## 2019-12-25 DIAGNOSIS — J449 Chronic obstructive pulmonary disease, unspecified: Secondary | ICD-10-CM | POA: Diagnosis not present

## 2019-12-25 DIAGNOSIS — K219 Gastro-esophageal reflux disease without esophagitis: Secondary | ICD-10-CM | POA: Diagnosis not present

## 2019-12-25 DIAGNOSIS — E119 Type 2 diabetes mellitus without complications: Secondary | ICD-10-CM | POA: Diagnosis not present

## 2019-12-25 DIAGNOSIS — I10 Essential (primary) hypertension: Secondary | ICD-10-CM | POA: Diagnosis not present

## 2019-12-30 DIAGNOSIS — G629 Polyneuropathy, unspecified: Secondary | ICD-10-CM | POA: Diagnosis not present

## 2019-12-30 DIAGNOSIS — E119 Type 2 diabetes mellitus without complications: Secondary | ICD-10-CM | POA: Diagnosis not present

## 2019-12-30 DIAGNOSIS — E785 Hyperlipidemia, unspecified: Secondary | ICD-10-CM | POA: Diagnosis not present

## 2019-12-30 DIAGNOSIS — I10 Essential (primary) hypertension: Secondary | ICD-10-CM | POA: Diagnosis not present

## 2019-12-31 DIAGNOSIS — K529 Noninfective gastroenteritis and colitis, unspecified: Secondary | ICD-10-CM | POA: Diagnosis not present

## 2019-12-31 DIAGNOSIS — K922 Gastrointestinal hemorrhage, unspecified: Secondary | ICD-10-CM | POA: Diagnosis not present

## 2020-01-01 ENCOUNTER — Ambulatory Visit (INDEPENDENT_AMBULATORY_CARE_PROVIDER_SITE_OTHER): Payer: Self-pay | Admitting: General Surgery

## 2020-01-01 ENCOUNTER — Other Ambulatory Visit: Payer: Self-pay

## 2020-01-01 ENCOUNTER — Other Ambulatory Visit: Payer: Self-pay | Admitting: Family Medicine

## 2020-01-01 ENCOUNTER — Encounter: Payer: Self-pay | Admitting: General Surgery

## 2020-01-01 VITALS — BP 117/72 | HR 92 | Temp 97.7°F | Resp 16 | Ht 65.0 in | Wt 140.0 lb

## 2020-01-01 DIAGNOSIS — K5720 Diverticulitis of large intestine with perforation and abscess without bleeding: Secondary | ICD-10-CM

## 2020-01-01 NOTE — Patient Instructions (Addendum)
Will plan for CT a/p due to continued right lower quadrant pain and recent diverticulitis of the cecum with perforation. Will ask Nursing facility to check/ recheck UA and culture given continued symptoms of burning and pain with urination.   Monitor for air in the urine or sediment as you could develop a fistula between the bladder and colon after perforated diverticulitis.

## 2020-01-01 NOTE — Progress Notes (Signed)
[facility] Note   HPI:  79 y.o. Female presents to clinic for follow-up evaluation of her cecal diverticulitis with perforation. She did not require an IR drain and only had antibiotics. She has been eating and drinking and having Bms. Her BMs were loose but have improved after taking in buttermilk she reports.  Patient complains of lower abdominal pain and right greater than left side.  She says that she also has significant pain with urination. A UA was done and she says that the Nursing facility says it was negative.    Review of Systems:  No sediment or pneumaturia  Pain with urination Regular Bms Tolerating diet  Abdominal pain  All other review of systems: otherwise negative   Vital Signs:  BP 117/72   Pulse 92   Temp 97.7 F (36.5 C) (Other (Comment))   Resp 16   Ht 5\' 5"  (1.651 m)   Wt 140 lb (63.5 kg) Comment: Approx per pt  SpO2 98%   BMI 23.30 kg/m    Physical Exam:  Physical Exam Vitals reviewed.  Cardiovascular:     Rate and Rhythm: Normal rate.  Pulmonary:     Effort: Pulmonary effort is normal.  Abdominal:     General: There is no distension.     Palpations: Abdomen is soft.     Tenderness: There is abdominal tenderness in the right lower quadrant and suprapubic area.  Musculoskeletal:        General: No swelling.  Neurological:     General: No focal deficit present.     Mental Status: She is alert and oriented to person, place, and time.       Assessment:  79 y.o. yo Female with recent perforated cecal diverticulitis. She is having continued pain and some urinary symptoms. No symptoms of a colovesical fistula at this time but warned her of this so she can monitor.  Plan:  Will plan for CT a/p due to continued right lower quadrant pain and recent diverticulitis of the cecum with perforation. Will call grandson [name] with results and notify facility also.  CT is currently scheduled 6/23 and we will work to get it earlier within  the next week and call and notify everyone.   Will ask Nursing facility to check/ recheck UA and culture given continued symptoms of burning and pain with urination.   Monitor for air in the urine or sediment as you could develop a fistula between the bladder and colon after perforated diverticulitis.   All of the above recommendations were discussed with the patient and patient's family, and all of patient's and family's questions were answered to their expressed satisfaction.  Future Appointments  Date Time Provider [place]  01/08/2020  1:40 PM AP-AC[facility]A LAB AP-AC[facility]A None  01/15/2020  3:30 PM Derek Jack, MD AP-AC[facility]A None  01/21/2020  4:00 PM AP-CT 1 AP-CT [place] [facility]  04/13/2020 11:00 AM AP-ACAPA LAB AP-ACAPA None  04/13/2020 12:00 PM AP-AC[facility]A INJ NURSE AP-ACAPA None    Curlene Labrum, MD [facility] [address] [place], [place] [address] [phone] (office)

## 2020-01-05 DIAGNOSIS — K5720 Diverticulitis of large intestine with perforation and abscess without bleeding: Secondary | ICD-10-CM | POA: Diagnosis not present

## 2020-01-05 DIAGNOSIS — I10 Essential (primary) hypertension: Secondary | ICD-10-CM | POA: Diagnosis not present

## 2020-01-05 DIAGNOSIS — R300 Dysuria: Secondary | ICD-10-CM | POA: Diagnosis not present

## 2020-01-05 DIAGNOSIS — E119 Type 2 diabetes mellitus without complications: Secondary | ICD-10-CM | POA: Diagnosis not present

## 2020-01-08 ENCOUNTER — Other Ambulatory Visit: Payer: Self-pay

## 2020-01-08 ENCOUNTER — Inpatient Hospital Stay (HOSPITAL_COMMUNITY): Payer: Medicare HMO | Attending: Hematology

## 2020-01-08 ENCOUNTER — Ambulatory Visit (HOSPITAL_COMMUNITY): Admission: RE | Admit: 2020-01-08 | Payer: Medicare HMO | Source: Ambulatory Visit

## 2020-01-08 DIAGNOSIS — R000 Tachycardia, unspecified: Secondary | ICD-10-CM | POA: Diagnosis not present

## 2020-01-08 DIAGNOSIS — R2231 Localized swelling, mass and lump, right upper limb: Secondary | ICD-10-CM | POA: Diagnosis not present

## 2020-01-08 DIAGNOSIS — E119 Type 2 diabetes mellitus without complications: Secondary | ICD-10-CM | POA: Diagnosis not present

## 2020-01-08 DIAGNOSIS — E1169 Type 2 diabetes mellitus with other specified complication: Secondary | ICD-10-CM | POA: Diagnosis not present

## 2020-01-08 DIAGNOSIS — M79601 Pain in right arm: Secondary | ICD-10-CM | POA: Diagnosis not present

## 2020-01-08 DIAGNOSIS — Z7984 Long term (current) use of oral hypoglycemic drugs: Secondary | ICD-10-CM | POA: Diagnosis not present

## 2020-01-08 DIAGNOSIS — Z8673 Personal history of transient ischemic attack (TIA), and cerebral infarction without residual deficits: Secondary | ICD-10-CM | POA: Diagnosis not present

## 2020-01-08 DIAGNOSIS — D490 Neoplasm of unspecified behavior of digestive system: Secondary | ICD-10-CM | POA: Diagnosis not present

## 2020-01-08 DIAGNOSIS — Z20822 Contact with and (suspected) exposure to covid-19: Secondary | ICD-10-CM | POA: Diagnosis present

## 2020-01-08 DIAGNOSIS — Z90710 Acquired absence of both cervix and uterus: Secondary | ICD-10-CM | POA: Diagnosis not present

## 2020-01-08 DIAGNOSIS — C7931 Secondary malignant neoplasm of brain: Secondary | ICD-10-CM | POA: Diagnosis present

## 2020-01-08 DIAGNOSIS — K5732 Diverticulitis of large intestine without perforation or abscess without bleeding: Secondary | ICD-10-CM | POA: Diagnosis not present

## 2020-01-08 DIAGNOSIS — Z86711 Personal history of pulmonary embolism: Secondary | ICD-10-CM | POA: Diagnosis not present

## 2020-01-08 DIAGNOSIS — C50911 Malignant neoplasm of unspecified site of right female breast: Secondary | ICD-10-CM | POA: Diagnosis not present

## 2020-01-08 DIAGNOSIS — J439 Emphysema, unspecified: Secondary | ICD-10-CM | POA: Diagnosis not present

## 2020-01-08 DIAGNOSIS — Z7982 Long term (current) use of aspirin: Secondary | ICD-10-CM | POA: Diagnosis not present

## 2020-01-08 DIAGNOSIS — A4159 Other Gram-negative sepsis: Secondary | ICD-10-CM | POA: Diagnosis present

## 2020-01-08 DIAGNOSIS — Z66 Do not resuscitate: Secondary | ICD-10-CM | POA: Diagnosis present

## 2020-01-08 DIAGNOSIS — I959 Hypotension, unspecified: Secondary | ICD-10-CM | POA: Diagnosis not present

## 2020-01-08 DIAGNOSIS — R197 Diarrhea, unspecified: Secondary | ICD-10-CM | POA: Diagnosis not present

## 2020-01-08 DIAGNOSIS — K922 Gastrointestinal hemorrhage, unspecified: Secondary | ICD-10-CM | POA: Diagnosis not present

## 2020-01-08 DIAGNOSIS — U071 COVID-19: Secondary | ICD-10-CM | POA: Diagnosis not present

## 2020-01-08 DIAGNOSIS — C3490 Malignant neoplasm of unspecified part of unspecified bronchus or lung: Secondary | ICD-10-CM | POA: Diagnosis not present

## 2020-01-08 DIAGNOSIS — K921 Melena: Secondary | ICD-10-CM | POA: Diagnosis not present

## 2020-01-08 DIAGNOSIS — C7801 Secondary malignant neoplasm of right lung: Secondary | ICD-10-CM | POA: Diagnosis present

## 2020-01-08 DIAGNOSIS — C3491 Malignant neoplasm of unspecified part of right bronchus or lung: Secondary | ICD-10-CM | POA: Diagnosis not present

## 2020-01-08 DIAGNOSIS — I871 Compression of vein: Secondary | ICD-10-CM | POA: Diagnosis present

## 2020-01-08 DIAGNOSIS — Z79899 Other long term (current) drug therapy: Secondary | ICD-10-CM | POA: Diagnosis not present

## 2020-01-08 DIAGNOSIS — R911 Solitary pulmonary nodule: Secondary | ICD-10-CM | POA: Diagnosis not present

## 2020-01-08 DIAGNOSIS — F17210 Nicotine dependence, cigarettes, uncomplicated: Secondary | ICD-10-CM | POA: Diagnosis present

## 2020-01-08 DIAGNOSIS — K6389 Other specified diseases of intestine: Secondary | ICD-10-CM | POA: Diagnosis not present

## 2020-01-08 DIAGNOSIS — L538 Other specified erythematous conditions: Secondary | ICD-10-CM | POA: Diagnosis not present

## 2020-01-08 DIAGNOSIS — R109 Unspecified abdominal pain: Secondary | ICD-10-CM | POA: Diagnosis not present

## 2020-01-08 DIAGNOSIS — Z7951 Long term (current) use of inhaled steroids: Secondary | ICD-10-CM | POA: Diagnosis not present

## 2020-01-08 DIAGNOSIS — E785 Hyperlipidemia, unspecified: Secondary | ICD-10-CM | POA: Diagnosis present

## 2020-01-08 DIAGNOSIS — Z209 Contact with and (suspected) exposure to unspecified communicable disease: Secondary | ICD-10-CM | POA: Diagnosis not present

## 2020-01-08 DIAGNOSIS — Z9011 Acquired absence of right breast and nipple: Secondary | ICD-10-CM | POA: Diagnosis not present

## 2020-01-08 DIAGNOSIS — D509 Iron deficiency anemia, unspecified: Secondary | ICD-10-CM | POA: Diagnosis not present

## 2020-01-08 DIAGNOSIS — C180 Malignant neoplasm of cecum: Secondary | ICD-10-CM | POA: Diagnosis present

## 2020-01-08 DIAGNOSIS — N390 Urinary tract infection, site not specified: Secondary | ICD-10-CM | POA: Diagnosis present

## 2020-01-08 DIAGNOSIS — D62 Acute posthemorrhagic anemia: Secondary | ICD-10-CM | POA: Diagnosis not present

## 2020-01-08 DIAGNOSIS — I10 Essential (primary) hypertension: Secondary | ICD-10-CM | POA: Diagnosis present

## 2020-01-08 DIAGNOSIS — A419 Sepsis, unspecified organism: Secondary | ICD-10-CM | POA: Diagnosis not present

## 2020-01-08 DIAGNOSIS — J9601 Acute respiratory failure with hypoxia: Secondary | ICD-10-CM | POA: Diagnosis present

## 2020-01-08 DIAGNOSIS — L03113 Cellulitis of right upper limb: Secondary | ICD-10-CM | POA: Diagnosis present

## 2020-01-08 DIAGNOSIS — M79609 Pain in unspecified limb: Secondary | ICD-10-CM | POA: Diagnosis not present

## 2020-01-08 DIAGNOSIS — R918 Other nonspecific abnormal finding of lung field: Secondary | ICD-10-CM | POA: Diagnosis not present

## 2020-01-08 DIAGNOSIS — R6520 Severe sepsis without septic shock: Secondary | ICD-10-CM | POA: Diagnosis present

## 2020-01-08 DIAGNOSIS — R531 Weakness: Secondary | ICD-10-CM | POA: Diagnosis not present

## 2020-01-08 DIAGNOSIS — C3411 Malignant neoplasm of upper lobe, right bronchus or lung: Secondary | ICD-10-CM | POA: Diagnosis not present

## 2020-01-08 DIAGNOSIS — M7989 Other specified soft tissue disorders: Secondary | ICD-10-CM | POA: Diagnosis not present

## 2020-01-08 DIAGNOSIS — Z853 Personal history of malignant neoplasm of breast: Secondary | ICD-10-CM | POA: Diagnosis not present

## 2020-01-08 LAB — COMPREHENSIVE METABOLIC PANEL
ALT: 10 U/L (ref 0–44)
AST: 18 U/L (ref 15–41)
Albumin: 3.5 g/dL (ref 3.5–5.0)
Alkaline Phosphatase: 41 U/L (ref 38–126)
Anion gap: 11 (ref 5–15)
BUN: 9 mg/dL (ref 8–23)
CO2: 23 mmol/L (ref 22–32)
Calcium: 9.8 mg/dL (ref 8.9–10.3)
Chloride: 97 mmol/L — ABNORMAL LOW (ref 98–111)
Creatinine, Ser: 0.76 mg/dL (ref 0.44–1.00)
GFR calc Af Amer: >60 mL/min (ref >60)
GFR calc non Af Amer: >60 mL/min (ref >60)
Glucose, Bld: 110 mg/dL — ABNORMAL HIGH (ref 70–99)
Potassium: 4.6 mmol/L (ref 3.5–5.1)
Sodium: 131 mmol/L — ABNORMAL LOW (ref 135–145)
Total Bilirubin: 0.3 mg/dL (ref 0.3–1.2)
Total Protein: 6.8 g/dL (ref 6.5–8.1)

## 2020-01-08 LAB — CBC WITH DIFFERENTIAL/PLATELET
Abs Immature Granulocytes: 0.02 10*3/uL (ref 0.00–0.07)
Basophils Absolute: 0.1 10*3/uL (ref 0.0–0.1)
Basophils Relative: 1 %
Eosinophils Absolute: 0.2 10*3/uL (ref 0.0–0.5)
Eosinophils Relative: 2 %
HCT: 38.3 % (ref 36.0–46.0)
Hemoglobin: 12 g/dL (ref 12.0–15.0)
Immature Granulocytes: 0 %
Lymphocytes Relative: 12 %
Lymphs Abs: 1.1 10*3/uL (ref 0.7–4.0)
MCH: 29.9 pg (ref 26.0–34.0)
MCHC: 31.3 g/dL (ref 30.0–36.0)
MCV: 95.5 fL (ref 80.0–100.0)
Monocytes Absolute: 0.8 10*3/uL (ref 0.1–1.0)
Monocytes Relative: 9 %
Neutro Abs: 6.6 10*3/uL (ref 1.7–7.7)
Neutrophils Relative %: 76 %
Platelets: 210 10*3/uL (ref 150–400)
RBC: 4.01 MIL/uL (ref 3.87–5.11)
RDW: 16.4 % — ABNORMAL HIGH (ref 11.5–15.5)
WBC: 8.7 10*3/uL (ref 4.0–10.5)
nRBC: 0 % (ref 0.0–0.2)

## 2020-01-08 LAB — IRON AND TIBC
Iron: 57 ug/dL (ref 28–170)
Saturation Ratios: 31 % (ref 10.4–31.8)
TIBC: 186 ug/dL — ABNORMAL LOW (ref 250–450)
UIBC: 129 ug/dL

## 2020-01-08 LAB — VITAMIN D 25 HYDROXY (VIT D DEFICIENCY, FRACTURES): Vit D, 25-Hydroxy: 32.78 ng/mL (ref 30–100)

## 2020-01-08 LAB — FERRITIN: Ferritin: 161 ng/mL (ref 11–307)

## 2020-01-08 LAB — VITAMIN B12: Vitamin B-12: 322 pg/mL (ref 180–914)

## 2020-01-10 ENCOUNTER — Other Ambulatory Visit: Payer: Self-pay

## 2020-01-10 ENCOUNTER — Emergency Department (HOSPITAL_COMMUNITY): Payer: Medicare HMO

## 2020-01-10 ENCOUNTER — Encounter (HOSPITAL_COMMUNITY): Payer: Self-pay

## 2020-01-10 ENCOUNTER — Inpatient Hospital Stay (HOSPITAL_COMMUNITY)
Admission: AD | Admit: 2020-01-10 | Discharge: 2020-01-24 | DRG: 871 | Disposition: A | Discharge status: Home Health | Payer: Medicare HMO | Attending: Internal Medicine | Admitting: Internal Medicine

## 2020-01-10 DIAGNOSIS — D62 Acute posthemorrhagic anemia: Secondary | ICD-10-CM | POA: Diagnosis not present

## 2020-01-10 DIAGNOSIS — R6520 Severe sepsis without septic shock: Secondary | ICD-10-CM | POA: Diagnosis present

## 2020-01-10 DIAGNOSIS — T380X5A Adverse effect of glucocorticoids and synthetic analogues, initial encounter: Secondary | ICD-10-CM | POA: Diagnosis present

## 2020-01-10 DIAGNOSIS — E785 Hyperlipidemia, unspecified: Secondary | ICD-10-CM | POA: Diagnosis present

## 2020-01-10 DIAGNOSIS — L03113 Cellulitis of right upper limb: Secondary | ICD-10-CM | POA: Diagnosis present

## 2020-01-10 DIAGNOSIS — C7801 Secondary malignant neoplasm of right lung: Secondary | ICD-10-CM | POA: Diagnosis present

## 2020-01-10 DIAGNOSIS — K921 Melena: Secondary | ICD-10-CM | POA: Diagnosis not present

## 2020-01-10 DIAGNOSIS — N390 Urinary tract infection, site not specified: Secondary | ICD-10-CM | POA: Diagnosis present

## 2020-01-10 DIAGNOSIS — Z7951 Long term (current) use of inhaled steroids: Secondary | ICD-10-CM

## 2020-01-10 DIAGNOSIS — Z7984 Long term (current) use of oral hypoglycemic drugs: Secondary | ICD-10-CM

## 2020-01-10 DIAGNOSIS — Z66 Do not resuscitate: Secondary | ICD-10-CM | POA: Diagnosis present

## 2020-01-10 DIAGNOSIS — R2231 Localized swelling, mass and lump, right upper limb: Secondary | ICD-10-CM | POA: Diagnosis not present

## 2020-01-10 DIAGNOSIS — M79601 Pain in right arm: Secondary | ICD-10-CM | POA: Diagnosis not present

## 2020-01-10 DIAGNOSIS — C7931 Secondary malignant neoplasm of brain: Secondary | ICD-10-CM

## 2020-01-10 DIAGNOSIS — J9601 Acute respiratory failure with hypoxia: Secondary | ICD-10-CM | POA: Diagnosis present

## 2020-01-10 DIAGNOSIS — C50919 Malignant neoplasm of unspecified site of unspecified female breast: Secondary | ICD-10-CM | POA: Diagnosis present

## 2020-01-10 DIAGNOSIS — Z9011 Acquired absence of right breast and nipple: Secondary | ICD-10-CM

## 2020-01-10 DIAGNOSIS — R918 Other nonspecific abnormal finding of lung field: Secondary | ICD-10-CM

## 2020-01-10 DIAGNOSIS — Z20822 Contact with and (suspected) exposure to covid-19: Secondary | ICD-10-CM | POA: Diagnosis present

## 2020-01-10 DIAGNOSIS — Z90710 Acquired absence of both cervix and uterus: Secondary | ICD-10-CM | POA: Diagnosis not present

## 2020-01-10 DIAGNOSIS — F17210 Nicotine dependence, cigarettes, uncomplicated: Secondary | ICD-10-CM | POA: Diagnosis present

## 2020-01-10 DIAGNOSIS — I871 Compression of vein: Secondary | ICD-10-CM | POA: Diagnosis present

## 2020-01-10 DIAGNOSIS — Z170 Estrogen receptor positive status [ER+]: Secondary | ICD-10-CM

## 2020-01-10 DIAGNOSIS — C3411 Malignant neoplasm of upper lobe, right bronchus or lung: Secondary | ICD-10-CM | POA: Diagnosis present

## 2020-01-10 DIAGNOSIS — Z853 Personal history of malignant neoplasm of breast: Secondary | ICD-10-CM

## 2020-01-10 DIAGNOSIS — Z79899 Other long term (current) drug therapy: Secondary | ICD-10-CM | POA: Diagnosis not present

## 2020-01-10 DIAGNOSIS — E1169 Type 2 diabetes mellitus with other specified complication: Secondary | ICD-10-CM | POA: Diagnosis present

## 2020-01-10 DIAGNOSIS — I10 Essential (primary) hypertension: Secondary | ICD-10-CM | POA: Diagnosis present

## 2020-01-10 DIAGNOSIS — A4159 Other Gram-negative sepsis: Principal | ICD-10-CM | POA: Diagnosis present

## 2020-01-10 DIAGNOSIS — Z923 Personal history of irradiation: Secondary | ICD-10-CM

## 2020-01-10 DIAGNOSIS — Z86711 Personal history of pulmonary embolism: Secondary | ICD-10-CM | POA: Diagnosis not present

## 2020-01-10 DIAGNOSIS — C50911 Malignant neoplasm of unspecified site of right female breast: Secondary | ICD-10-CM | POA: Diagnosis not present

## 2020-01-10 DIAGNOSIS — K922 Gastrointestinal hemorrhage, unspecified: Secondary | ICD-10-CM | POA: Diagnosis not present

## 2020-01-10 DIAGNOSIS — Z8673 Personal history of transient ischemic attack (TIA), and cerebral infarction without residual deficits: Secondary | ICD-10-CM

## 2020-01-10 DIAGNOSIS — Z7982 Long term (current) use of aspirin: Secondary | ICD-10-CM | POA: Diagnosis not present

## 2020-01-10 DIAGNOSIS — A419 Sepsis, unspecified organism: Secondary | ICD-10-CM | POA: Diagnosis present

## 2020-01-10 DIAGNOSIS — C180 Malignant neoplasm of cecum: Secondary | ICD-10-CM | POA: Diagnosis present

## 2020-01-10 DIAGNOSIS — K6389 Other specified diseases of intestine: Secondary | ICD-10-CM

## 2020-01-10 DIAGNOSIS — K5732 Diverticulitis of large intestine without perforation or abscess without bleeding: Secondary | ICD-10-CM

## 2020-01-10 DIAGNOSIS — E1165 Type 2 diabetes mellitus with hyperglycemia: Secondary | ICD-10-CM | POA: Diagnosis present

## 2020-01-10 LAB — CBC WITH DIFFERENTIAL/PLATELET
Abs Immature Granulocytes: 0.1 10*3/uL — ABNORMAL HIGH (ref 0.00–0.07)
Basophils Absolute: 0 10*3/uL (ref 0.0–0.1)
Basophils Relative: 0 %
Eosinophils Absolute: 0 10*3/uL (ref 0.0–0.5)
Eosinophils Relative: 0 %
HCT: 38.1 % (ref 36.0–46.0)
Hemoglobin: 12.2 g/dL (ref 12.0–15.0)
Immature Granulocytes: 1 %
Lymphocytes Relative: 4 %
Lymphs Abs: 0.8 10*3/uL (ref 0.7–4.0)
MCH: 30 pg (ref 26.0–34.0)
MCHC: 32 g/dL (ref 30.0–36.0)
MCV: 93.8 fL (ref 80.0–100.0)
Monocytes Absolute: 0.6 10*3/uL (ref 0.1–1.0)
Monocytes Relative: 3 %
Neutro Abs: 17.4 10*3/uL — ABNORMAL HIGH (ref 1.7–7.7)
Neutrophils Relative %: 92 %
Platelets: 177 10*3/uL (ref 150–400)
RBC: 4.06 MIL/uL (ref 3.87–5.11)
RDW: 16.4 % — ABNORMAL HIGH (ref 11.5–15.5)
WBC: 19 10*3/uL — ABNORMAL HIGH (ref 4.0–10.5)
nRBC: 0 % (ref 0.0–0.2)

## 2020-01-10 LAB — GLUCOSE, CAPILLARY: Glucose-Capillary: 103 mg/dL — ABNORMAL HIGH (ref 70–99)

## 2020-01-10 LAB — COMPREHENSIVE METABOLIC PANEL
ALT: 9 U/L (ref 0–44)
AST: 13 U/L — ABNORMAL LOW (ref 15–41)
Albumin: 3.4 g/dL — ABNORMAL LOW (ref 3.5–5.0)
Alkaline Phosphatase: 43 U/L (ref 38–126)
Anion gap: 10 (ref 5–15)
BUN: 10 mg/dL (ref 8–23)
CO2: 25 mmol/L (ref 22–32)
Calcium: 9.1 mg/dL (ref 8.9–10.3)
Chloride: 97 mmol/L — ABNORMAL LOW (ref 98–111)
Creatinine, Ser: 0.84 mg/dL (ref 0.44–1.00)
GFR calc Af Amer: >60 mL/min (ref >60)
GFR calc non Af Amer: >60 mL/min (ref >60)
Glucose, Bld: 156 mg/dL — ABNORMAL HIGH (ref 70–99)
Potassium: 4.1 mmol/L (ref 3.5–5.1)
Sodium: 132 mmol/L — ABNORMAL LOW (ref 135–145)
Total Bilirubin: 0.9 mg/dL (ref 0.3–1.2)
Total Protein: 7 g/dL (ref 6.5–8.1)

## 2020-01-10 LAB — URINALYSIS, ROUTINE W REFLEX MICROSCOPIC
Bilirubin Urine: NEGATIVE
Glucose, UA: NEGATIVE mg/dL
Hgb urine dipstick: NEGATIVE
Ketones, ur: NEGATIVE mg/dL
Nitrite: POSITIVE — AB
Protein, ur: 30 mg/dL — AB
Specific Gravity, Urine: 1.014 (ref 1.005–1.030)
WBC, UA: >50 WBC/hpf — ABNORMAL HIGH (ref 0–5)
pH: 7 (ref 5.0–8.0)

## 2020-01-10 LAB — CBG MONITORING, ED: Glucose-Capillary: 124 mg/dL — ABNORMAL HIGH (ref 70–99)

## 2020-01-10 LAB — APTT: aPTT: 31 seconds (ref 24–36)

## 2020-01-10 LAB — SARS CORONAVIRUS 2 BY RT PCR (HOSPITAL ORDER, PERFORMED IN ~~LOC~~ HOSPITAL LAB): SARS Coronavirus 2: NEGATIVE

## 2020-01-10 LAB — PROTIME-INR
INR: 1 (ref 0.8–1.2)
Prothrombin Time: 13.2 seconds (ref 11.4–15.2)

## 2020-01-10 LAB — LACTIC ACID, PLASMA: Lactic Acid, Venous: 1.6 mmol/L (ref 0.5–1.9)

## 2020-01-10 MED ORDER — PANTOPRAZOLE SODIUM 40 MG PO TBEC
40.0000 mg | DELAYED_RELEASE_TABLET | Freq: Every day | ORAL | Status: DC
  Administered 2020-01-11 – 2020-01-24 (×13): 40 mg via ORAL
  Filled 2020-01-10 (×13): qty 1

## 2020-01-10 MED ORDER — FLUTICASONE PROPIONATE 50 MCG/ACT NA SUSP
2.0000 | NASAL | Status: AC | PRN
  Filled 2020-01-10: qty 16

## 2020-01-10 MED ORDER — NAPHAZOLINE-GLYCERIN 0.012-0.2 % OP SOLN
2.0000 [drp] | Freq: Every day | OPHTHALMIC | Status: DC | PRN
  Filled 2020-01-10: qty 15

## 2020-01-10 MED ORDER — KETOROLAC TROMETHAMINE 15 MG/ML IJ SOLN: 15.0000 mg | Freq: Four times a day (QID) | INTRAMUSCULAR | Status: DC | PRN

## 2020-01-10 MED ORDER — HEPARIN SODIUM (PORCINE) 5000 UNIT/ML IJ SOLN
5000.0000 [IU] | Freq: Three times a day (TID) | INTRAMUSCULAR | Status: DC
  Administered 2020-01-10 – 2020-01-11 (×2): 5000 [IU] via SUBCUTANEOUS
  Filled 2020-01-10 (×2): qty 1

## 2020-01-10 MED ORDER — LINAGLIPTIN 5 MG PO TABS
5.0000 mg | ORAL_TABLET | Freq: Every day | ORAL | Status: DC
  Administered 2020-01-11: 5 mg via ORAL
  Filled 2020-01-10: qty 1

## 2020-01-10 MED ORDER — ASPIRIN EC 325 MG PO TBEC
325.0000 mg | DELAYED_RELEASE_TABLET | Freq: Every day | ORAL | Status: DC
  Administered 2020-01-11: 325 mg via ORAL
  Filled 2020-01-10: qty 1

## 2020-01-10 MED ORDER — QUETIAPINE FUMARATE 25 MG PO TABS
12.5000 mg | ORAL_TABLET | Freq: Every day | ORAL | Status: DC
  Administered 2020-01-10 – 2020-01-23 (×14): 12.5 mg via ORAL
  Filled 2020-01-10 (×15): qty 1

## 2020-01-10 MED ORDER — TIOTROPIUM BROMIDE MONOHYDRATE 18 MCG IN CAPS: 18.0000 ug | ORAL_CAPSULE | Freq: Every day | RESPIRATORY_TRACT | Status: DC | PRN

## 2020-01-10 MED ORDER — SODIUM CHLORIDE 0.9 % IV SOLN: 2.0000 g | INTRAVENOUS | Status: DC

## 2020-01-10 MED ORDER — ENOXAPARIN SODIUM 40 MG/0.4ML ~~LOC~~ SOLN: 40.0000 mg | SUBCUTANEOUS | Status: DC

## 2020-01-10 MED ORDER — VANCOMYCIN HCL IN DEXTROSE 1-5 GM/200ML-% IV SOLN
1000.0000 mg | Freq: Once | INTRAVENOUS | Status: AC
  Administered 2020-01-10: 1000 mg via INTRAVENOUS
  Filled 2020-01-10: qty 200

## 2020-01-10 MED ORDER — IOHEXOL 300 MG/ML  SOLN
100.0000 mL | Freq: Once | INTRAMUSCULAR | Status: AC | PRN
  Administered 2020-01-10: 100 mL via INTRAVENOUS

## 2020-01-10 MED ORDER — MOMETASONE FURO-FORMOTEROL FUM 200-5 MCG/ACT IN AERO
2.0000 | INHALATION_SPRAY | Freq: Two times a day (BID) | RESPIRATORY_TRACT | Status: DC
  Administered 2020-01-11 – 2020-01-24 (×23): 2 via RESPIRATORY_TRACT
  Filled 2020-01-10 (×2): qty 8.8

## 2020-01-10 MED ORDER — LACTATED RINGERS IV BOLUS (SEPSIS)
1000.0000 mL | Freq: Once | INTRAVENOUS | Status: AC
  Administered 2020-01-10: 1000 mL via INTRAVENOUS

## 2020-01-10 MED ORDER — FERROUS SULFATE 325 (65 FE) MG PO TABS
325.0000 mg | ORAL_TABLET | Freq: Every day | ORAL | Status: DC
  Administered 2020-01-11 – 2020-01-24 (×13): 325 mg via ORAL
  Filled 2020-01-10 (×13): qty 1

## 2020-01-10 MED ORDER — ACETAMINOPHEN ER 650 MG PO TBCR: 650.0000 mg | EXTENDED_RELEASE_TABLET | ORAL | Status: DC | PRN

## 2020-01-10 MED ORDER — INSULIN ASPART 100 UNIT/ML ~~LOC~~ SOLN
0.0000 [IU] | Freq: Three times a day (TID) | SUBCUTANEOUS | Status: DC
  Administered 2020-01-10 – 2020-01-11 (×2): 2 [IU] via SUBCUTANEOUS
  Administered 2020-01-11: 3 [IU] via SUBCUTANEOUS
  Administered 2020-01-12: 5 [IU] via SUBCUTANEOUS
  Administered 2020-01-13 – 2020-01-15 (×3): 3 [IU] via SUBCUTANEOUS
  Administered 2020-01-16 – 2020-01-17 (×2): 5 [IU] via SUBCUTANEOUS
  Administered 2020-01-17: 2 [IU] via SUBCUTANEOUS
  Administered 2020-01-18 – 2020-01-19 (×2): 5 [IU] via SUBCUTANEOUS
  Administered 2020-01-20: 2 [IU] via SUBCUTANEOUS
  Administered 2020-01-20 – 2020-01-21 (×2): 5 [IU] via SUBCUTANEOUS
  Administered 2020-01-21: 8 [IU] via SUBCUTANEOUS
  Filled 2020-01-10: qty 1

## 2020-01-10 MED ORDER — SENNA 8.6 MG PO TABS
1.0000 | ORAL_TABLET | Freq: Two times a day (BID) | ORAL | Status: DC
  Administered 2020-01-10 – 2020-01-21 (×11): 8.6 mg via ORAL
  Filled 2020-01-10 (×24): qty 1

## 2020-01-10 MED ORDER — ALBUTEROL SULFATE HFA 108 (90 BASE) MCG/ACT IN AERS
1.0000 | INHALATION_SPRAY | Freq: Four times a day (QID) | RESPIRATORY_TRACT | Status: DC | PRN
  Filled 2020-01-10: qty 6.7

## 2020-01-10 MED ORDER — PIPERACILLIN-TAZOBACTAM 3.375 G IVPB
3.3750 g | Freq: Three times a day (TID) | INTRAVENOUS | Status: DC
  Administered 2020-01-10 – 2020-01-13 (×8): 3.375 g via INTRAVENOUS
  Filled 2020-01-10 (×9): qty 50

## 2020-01-10 MED ORDER — PIPERACILLIN-TAZOBACTAM 3.375 G IVPB 30 MIN
3.3750 g | Freq: Once | INTRAVENOUS | Status: AC
  Administered 2020-01-10: 3.375 g via INTRAVENOUS
  Filled 2020-01-10: qty 50

## 2020-01-10 MED ORDER — UMECLIDINIUM BROMIDE 62.5 MCG/INH IN AEPB
1.0000 | INHALATION_SPRAY | Freq: Every day | RESPIRATORY_TRACT | Status: DC | PRN
  Filled 2020-01-10: qty 7

## 2020-01-10 MED ORDER — PRAVASTATIN SODIUM 20 MG PO TABS
40.0000 mg | ORAL_TABLET | Freq: Every day | ORAL | Status: DC
  Administered 2020-01-11 – 2020-01-23 (×13): 40 mg via ORAL
  Filled 2020-01-10 (×8): qty 2
  Filled 2020-01-10: qty 1
  Filled 2020-01-10 (×3): qty 2
  Filled 2020-01-10: qty 1

## 2020-01-10 MED ORDER — METOPROLOL TARTRATE 25 MG PO TABS
12.5000 mg | ORAL_TABLET | Freq: Two times a day (BID) | ORAL | Status: DC
  Administered 2020-01-10 – 2020-01-24 (×27): 12.5 mg via ORAL
  Filled 2020-01-10 (×27): qty 1

## 2020-01-10 MED ORDER — GABAPENTIN 300 MG PO CAPS
600.0000 mg | ORAL_CAPSULE | Freq: Three times a day (TID) | ORAL | Status: DC
  Administered 2020-01-10 – 2020-01-24 (×40): 600 mg via ORAL
  Filled 2020-01-10 (×40): qty 2

## 2020-01-10 MED ORDER — ACETAMINOPHEN 325 MG PO TABS
650.0000 mg | ORAL_TABLET | Freq: Four times a day (QID) | ORAL | Status: DC | PRN
  Administered 2020-01-14 – 2020-01-23 (×9): 650 mg via ORAL
  Filled 2020-01-10 (×9): qty 2

## 2020-01-10 MED ORDER — LACTATED RINGERS IV SOLN: INTRAVENOUS | Status: AC

## 2020-01-10 MED ORDER — ALBUTEROL SULFATE (2.5 MG/3ML) 0.083% IN NEBU: 2.5000 mg | INHALATION_SOLUTION | Freq: Four times a day (QID) | RESPIRATORY_TRACT | Status: DC | PRN

## 2020-01-10 NOTE — ED Provider Notes (Signed)
  Provider Note MRN:  [id number]  Arrival date & time: 01/10/20    ED Course and Medical Decision Making  Assumed care from Dr. Sabra Heck at shift change.  Sepsis due to soft tissue origin, accepted for admission by hospitalist service.  Procedures  Final Clinical Impressions(s) / ED Diagnoses     ICD-10-CM   1. Sepsis, due to unspecified organism, unspecified whether acute organ dysfunction present [facility])  A41.9     ED Discharge Orders    None      Discharge Instructions   None     Barth Kirks. [place] Small, [place] [email or web address]    Maudie Flakes, MD 01/10/20 [phone]

## 2020-01-10 NOTE — ED Provider Notes (Signed)
[facility] EMERGENCY DEPARTMENT Provider Note   CSN: [id number] Arrival date & time: 01/10/20  1249     History Chief Complaint  Patient presents with  . Fever  . Weakness    [name] is a 79 y.o. female.  HPI   This patient is an 79 year old female, she was recently admitted to the hospital for diverticulitis, she spent 1 week in the hospital from the 14th through the 21st.  She is a known diabetic, she also has a history of tobacco use, hypertension and hyperlipidemia.  She had early perforation of her diverticulitis and was started on antibiotics in the hospital.  Surgery was consulted, she improved clinically and had her diet advanced, she did not need surgery at that time.  She was discharged on Augmentin and completed 14 days of treatment.  She has just recently yesterday gone from a rehab after her admission to the hospital back home.  When the home health nurse went out to check on her today they found her to be tachycardic, hypotensive, febrile.  The patient states that she is very weak, feels like she is to throw up, complains of having some coughing, some abdominal pain and some significant redness of the swelling of the right arm.  She has had prior lymph node dissection of the right axilla secondary to breast cancer, she has chronic swelling in the arm but is usually not red and hot.  The paramedics started an IV in her right arm prehospital as they could not do anything on the left.  The patient symptoms are severe persistent and gradually worsening  Past Medical History:  Diagnosis Date  . Breast CA ([place]) 01/16/2011  . Breast cancer ([place])    rt dx 2010; mastectomy only  . Diverticulitis   . Embolism - blood clot    left lung  . History of prolapse of bladder    patient has gellhorn pessary  . Hypertension   . Infiltrating ductal carcinoma of breast ([place]) 01/16/2011   Started Arimidex on 02/24/2009. Stage IB (T1b N0 M0) grade 1 infiltrating ductal carcinoma of  the right breast, status post right modified radical mastectomy on 01/22/2009 for a 10 mm cancer, ER +100%, PR +98%, KI-67 marker was 9%, HER-2 negative, no LVI identified, and 11 lymph nodes were negative, all margins were clear. She had some associated DCIS, is now on Arimidex and she will take that for  . Stroke ([place])   . Tendonitis 07/2010   shot of cortisone    Patient Active Problem List   Diagnosis Date Noted  . Sepsis due to undetermined organism ([place]) 12/18/2019  . Delirium tremens ([place])   . Chronic obstructive pulmonary disease ([place])   . Gastroesophageal reflux disease   . Sepsis ([place]) 12/12/2019  . Diverticulitis of large intestine with perforation without bleeding   . Diarrhea 10/01/2019  . IDA (iron deficiency anemia) 03/27/2019  . Hemorrhoids 03/27/2019  . Rectal bleeding 03/27/2019  . Osteoporosis 02/26/2019  . Special screening for malignant neoplasms, colon   . Pelvic relaxation due to enterocele, vaginal 10/31/2017  . Stroke (cerebrum) ([place]) 03/22/2016  . Tobacco abuse 03/22/2016  . Type 2 diabetes mellitus with hyperlipidemia ([place]) 03/22/2016  . Benign essential HTN 03/22/2016  . Hyperlipidemia 03/22/2016  . Acute left-sided weakness 03/22/2016  . Osteopenia 01/25/2014  . Interstitial lung disease ([place]) 09/13/2011  . Embolism - blood clot   . Infiltrating ductal carcinoma of breast ([place]) 01/16/2011  . Tendonitis   . DEQUERVAIN'S  08/10/2010    Past Surgical History:  Procedure Laterality Date  . ABDOMINAL HYSTERECTOMY  1987  . APPENDECTOMY  1974   blood clot in lung  . BREAST BIOPSY  1968   left  . COLONOSCOPY N/A 01/11/2018   Procedure: COLONOSCOPY;  Surgeon: Danie Binder, MD;  Location: [facility] ENDO SUITE;  Service: Endoscopy;  Laterality: N/A;  10:00  . MASTECTOMY MODIFIED RADICAL  01/22/2009   rt  . NASAL SINUS SURGERY  2002  . POLYPECTOMY  01/11/2018   Procedure: POLYPECTOMY;  Surgeon: Danie Binder, MD;  Location: [facility] ENDO SUITE;  Service:  Endoscopy;;  ascending; descending;  . TEAR DUCT PROBING    . tubal ligation  1972  . YAG LASER APPLICATION Right 6/83/4196   Procedure: YAG LASER APPLICATION;  Surgeon: Williams Che, MD;  Location: [facility] ORS;  Service: Ophthalmology;  Laterality: Right;     OB History    Gravida  3   Para  3   Term  3   Preterm      AB      Living  3     SAB      TAB      Ectopic      Multiple      Live Births  3           Family History  Problem Relation Age of Onset  . Breast cancer Sister   . Breast cancer Maternal Grandmother   . Colon cancer Daughter 15  . Hypertension Paternal Grandfather   . Stroke Paternal Grandfather   . Other Paternal Grandmother        hardening of arteries  . Other Father        gun shot wound  . Cancer Sister   . Cancer Daughter   . Stroke Son   . Heart attack Son   . Gastric cancer Neg Hx   . Esophageal cancer Neg Hx     Social History   Tobacco Use  . Smoking status: Current Every Day Smoker    Packs/day: 0.50    Years: 62.00    Pack years: 31.00    Types: Cigarettes  . Smokeless tobacco: Never Used  Vaping Use  . Vaping Use: Never used  Substance Use Topics  . Alcohol use: Yes    Comment: occ  . Drug use: No    Home Medications Prior to Admission medications   Medication Sig Start Date End Date Taking? Authorizing Provider  acetaminophen (TYLENOL) 650 MG CR tablet Take 650 mg by mouth as needed for pain.    [provider]  albuterol (PROVENTIL HFA;VENTOLIN HFA) 108 (90 Base) MCG/ACT inhaler Inhale 1 puff into the lungs every 6 (six) hours as needed for wheezing or shortness of breath.    [provider]  aspirin EC 325 MG tablet Take 325 mg by mouth daily. 11/03/19   [provider]  calcium-vitamin D (OSCAL WITH D) 500-200 MG-UNIT TABS tablet Take 1 tablet by mouth in the morning, at noon, and at bedtime. 11/03/19   [provider]  ferrous sulfate 325 (65 FE) MG tablet Take 325 mg by  mouth daily with breakfast.    [provider]  fluticasone (FLONASE) 50 MCG/ACT nasal spray Place 2 sprays into both nostrils as needed.  01/12/16   [provider]  gabapentin (NEURONTIN) 600 MG tablet Take 1 tablet (600 mg total) by mouth 3 (three) times daily. 12/19/19   Orson Eva, MD  lovastatin (MEVACOR) 40 MG tablet Take 40 mg by mouth at bedtime.     [provider]  metoprolol tartrate (LOPRESSOR) 25 MG tablet Take 0.5 tablets (12.5 mg total) by mouth 2 (two) times daily. 12/18/19   Orson Eva, MD  metroNIDAZOLE (METROGEL) 0.75 % vaginal gel Place 1 Applicatorful vaginally once a week. one applicatorful to vagina at bedtime weekly , as needed for vaginal discharge with pessary 10/31/17   Jonnie Kind, MD  naphazoline-glycerin (CLEAR EYES) 0.012-0.2 % SOLN Place 2 drops into both eyes daily as needed for irritation.    [provider]  omeprazole (PRILOSEC) 20 MG capsule Take 1 capsule by mouth daily. 06/28/13   [provider]  QUEtiapine (SEROQUEL) 25 MG tablet Take 0.5 tablets (12.5 mg total) by mouth at bedtime. 12/17/19   Barton Dubois, MD  SYMBICORT 160-4.5 MCG/ACT inhaler Inhale 2 puffs into the lungs as needed.  06/23/19   [provider]  tiotropium (SPIRIVA) 18 MCG inhalation capsule Place 18 mcg into inhaler and inhale daily as needed.    [provider]  TRADJENTA 5 MG TABS tablet Take 5 mg by mouth daily.  02/12/16   [provider]    Allergies    Pollen extract and Red dye  Review of Systems   Review of Systems  All other systems reviewed and are negative.   Physical Exam Updated Vital Signs There were no vitals taken for this visit.  Physical Exam Vitals and nursing note reviewed.  Constitutional:      General: She is in acute distress.     Appearance: She is well-developed. She is ill-appearing.  HENT:     Head: Normocephalic and atraumatic.     Mouth/Throat:     Pharynx: No oropharyngeal  exudate.  Eyes:     General: No scleral icterus.       Right eye: No discharge.        Left eye: No discharge.     Conjunctiva/sclera: Conjunctivae normal.     Pupils: Pupils are equal, round, and reactive to light.  Neck:     Thyroid: No thyromegaly.     Vascular: No JVD.  Cardiovascular:     Rate and Rhythm: Regular rhythm. Tachycardia present.     Heart sounds: Normal heart sounds. No murmur heard.  No friction rub. No gallop.      Comments: Tachycardic to 125, Pulmonary:     Effort: Pulmonary effort is normal. No respiratory distress.     Breath sounds: Rales present. No wheezing.     Comments: Rales at the bases bilaterally Abdominal:     General: Bowel sounds are normal. There is no distension.     Palpations: Abdomen is soft. There is no mass.     Tenderness: There is no abdominal tenderness.     Comments: Diffuse tenderness to palpation with mild guarding  Musculoskeletal:        General: Swelling present. No tenderness. Normal range of motion.     Cervical back: Normal range of motion and neck supple.     Comments: There is swelling warmth and heat from the right arm.  It is chronically swollen but is red from the shoulder through the fingertips  Lymphadenopathy:     Cervical: No cervical adenopathy.  Skin:    General: Skin is warm and dry.     Findings: Erythema present. No rash.  Neurological:     Mental Status: She is alert.     Coordination:  Coordination normal.     Comments: Awake alert and speaking full sentences  Psychiatric:        Behavior: Behavior normal.     ED Results / Procedures / Treatments   Labs (all labs ordered are listed, but only abnormal results are displayed) Labs Reviewed  COMPREHENSIVE METABOLIC PANEL - Abnormal; Notable for the following components:      Result Value   Sodium 132 (*)    Chloride 97 (*)    Glucose, Bld 156 (*)    Albumin 3.4 (*)    AST 13 (*)    All other components within normal limits  CBC WITH  DIFFERENTIAL/PLATELET - Abnormal; Notable for the following components:   WBC 19.0 (*)    RDW 16.4 (*)    Neutro Abs 17.4 (*)    Abs Immature Granulocytes 0.10 (*)    All other components within normal limits  URINALYSIS, ROUTINE W REFLEX MICROSCOPIC - Abnormal; Notable for the following components:   Color, Urine AMBER (*)    APPearance CLOUDY (*)    Protein, ur 30 (*)    Nitrite POSITIVE (*)    Leukocytes,Ua MODERATE (*)    WBC, UA >50 (*)    Bacteria, UA FEW (*)    All other components within normal limits  GLUCOSE, CAPILLARY - Abnormal; Notable for the following components:   Glucose-Capillary 103 (*)    All other components within normal limits  CBG MONITORING, ED - Abnormal; Notable for the following components:   Glucose-Capillary 124 (*)    All other components within normal limits  CULTURE, BLOOD (ROUTINE X 2)  CULTURE, BLOOD (ROUTINE X 2)  SARS CORONAVIRUS 2 BY RT PCR (HOSPITAL ORDER, [facility])  URINE CULTURE  LACTIC ACID, PLASMA  APTT  PROTIME-INR    EKG EKG Interpretation  Date/Time:  Saturday January 10 2020 13:13:39 EDT Ventricular Rate:  115 PR Interval:    QRS Duration: 92 QT Interval:  325 QTC Calculation: 450 R Axis:   0 Text Interpretation: Sinus tachycardia since last tracing no significant change Confirmed by Noemi Chapel [phone]) on 01/10/2020 1:50:13 PM   Radiology CT Chest W Contrast  Result Date: 01/10/2020 CLINICAL DATA:  Fever, hypotension, and abdominal pain. EXAM: CT CHEST, ABDOMEN, AND PELVIS WITH CONTRAST TECHNIQUE: Multidetector CT imaging of the chest, abdomen and pelvis was performed following the standard protocol during bolus administration of intravenous contrast. CONTRAST:  177m OMNIPAQUE IOHEXOL 300 MG/ML  SOLN COMPARISON:  Chest x-ray from same day. CT abdomen pelvis dated Dec 15, 2019. CT chest dated August 26, 2012. FINDINGS: CT CHEST FINDINGS Cardiovascular: Normal heart size. No pericardial effusion.  No thoracic aortic aneurysm or dissection. Coronary, aortic arch, and branch vessel atherosclerotic vascular disease. No central pulmonary embolism. Mediastinum/Nodes: Conglomerate mediastinal lymphadenopathy measures 6.2 x 3.4 cm and invades and severely narrows the SVC. Enlarged high right paratracheal lymph node measuring 1.9 cm in short axis. Enlarged bilateral hilar lymph nodes measuring up to 1.8 cm in short axis. No enlarged axillary lymph nodes. Prominent 8 mm right internal mamillary lymph node (series 2, image 18). Prior right axillary lymph node dissection. Subcentimeter hypodense nodules in both thyroid lobes. Not clinically significant; no follow-up imaging recommended. The trachea and esophagus demonstrate no significant findings. Lungs/Pleura: Large 5.7 x 5.6 cm mass in the anterior right upper lobe with invasion of the chest wall and right first and second ribs. Focal narrowing of the right subclavian vein just superior to the mass. Small  satellite nodules in the right upper lobe measuring up the mm (series 3, image 54). Multiple nodules in the left upper lobe measuring up to 2.2 cm (series 3, image 68). 9 mm nodule in the left lower lobe (series 3, image 100). Progressive basilar predominant peripheral subpleural reticulation and honeycombing throughout both lungs. No focal consolidation, pleural effusion, or pneumothorax. Musculoskeletal: Direct invasion of the right first and second ribs as above. Severe T1 compression fracture. CT ABDOMEN PELVIS FINDINGS Hepatobiliary: No focal liver abnormality is seen. No gallstones, gallbladder wall thickening, or biliary dilatation. Pancreas: Unremarkable. No pancreatic ductal dilatation or surrounding inflammatory changes. Spleen: Normal in size without focal abnormality. Adrenals/Urinary Tract: The adrenal glands are unremarkable. Unchanged small bilateral renal cysts. No renal calculi or hydronephrosis. Mild circumferential bladder wall thickening.  Stomach/Bowel: Progressive circumferential wall thickening cecum with enlarging mass anteriorly, currently measuring 3.9 x 3.7 cm, previously 2.9 x 3.1 cm. Surrounding inflammatory changes have resolved. Diffuse colonic diverticulosis. The stomach and small bowel are unremarkable. Vascular/Lymphatic: Aortic atherosclerosis. No enlarged abdominal or pelvic lymph nodes. Reproductive: Status post hysterectomy. No adnexal masses. Other: No free fluid or pneumoperitoneum. Unchanged small area of fat necrosis near the upper pole the right kidney. Musculoskeletal: No acute or significant osseous findings. IMPRESSION: Chest: 1. Large 5.7 x 5.6 cm mass in the anterior right upper lobe with invasion of the chest wall and right first and second ribs, consistent with primary bronchogenic carcinoma. 2. Ipsi- and contralateral nodal and pulmonary metastatic disease. Conglomerate mediastinal lymphadenopathy significantly narrows the SVC. 3. Severe T1 compression fracture, possibly pathologic. 4. UIP-type interstitial lung disease, progressed since 2014. 5. Aortic Atherosclerosis (ICD10-I70.0). Abdomen and pelvis: 1. Enlarging mass associated with the anterior cecum, currently measuring 3.9 x 3.7 cm, previously 2.9 x 3.1 cm. Surrounding inflammatory changes have resolved. Findings are concerning for primary colon cancer. Electronically Signed   By: Titus Dubin M.D.   On: 01/10/2020 15:45   CT ABDOMEN PELVIS W CONTRAST  Result Date: 01/10/2020 CLINICAL DATA:  Fever, hypotension, and abdominal pain. EXAM: CT CHEST, ABDOMEN, AND PELVIS WITH CONTRAST TECHNIQUE: Multidetector CT imaging of the chest, abdomen and pelvis was performed following the standard protocol during bolus administration of intravenous contrast. CONTRAST:  174m OMNIPAQUE IOHEXOL 300 MG/ML  SOLN COMPARISON:  Chest x-ray from same day. CT abdomen pelvis dated Dec 15, 2019. CT chest dated August 26, 2012. FINDINGS: CT CHEST FINDINGS Cardiovascular: Normal  heart size. No pericardial effusion. No thoracic aortic aneurysm or dissection. Coronary, aortic arch, and branch vessel atherosclerotic vascular disease. No central pulmonary embolism. Mediastinum/Nodes: Conglomerate mediastinal lymphadenopathy measures 6.2 x 3.4 cm and invades and severely narrows the SVC. Enlarged high right paratracheal lymph node measuring 1.9 cm in short axis. Enlarged bilateral hilar lymph nodes measuring up to 1.8 cm in short axis. No enlarged axillary lymph nodes. Prominent 8 mm right internal mamillary lymph node (series 2, image 18). Prior right axillary lymph node dissection. Subcentimeter hypodense nodules in both thyroid lobes. Not clinically significant; no follow-up imaging recommended. The trachea and esophagus demonstrate no significant findings. Lungs/Pleura: Large 5.7 x 5.6 cm mass in the anterior right upper lobe with invasion of the chest wall and right first and second ribs. Focal narrowing of the right subclavian vein just superior to the mass. Small satellite nodules in the right upper lobe measuring up the mm (series 3, image 54). Multiple nodules in the left upper lobe measuring up to 2.2 cm (series 3, image 68). 9 mm nodule in the left  lower lobe (series 3, image 100). Progressive basilar predominant peripheral subpleural reticulation and honeycombing throughout both lungs. No focal consolidation, pleural effusion, or pneumothorax. Musculoskeletal: Direct invasion of the right first and second ribs as above. Severe T1 compression fracture. CT ABDOMEN PELVIS FINDINGS Hepatobiliary: No focal liver abnormality is seen. No gallstones, gallbladder wall thickening, or biliary dilatation. Pancreas: Unremarkable. No pancreatic ductal dilatation or surrounding inflammatory changes. Spleen: Normal in size without focal abnormality. Adrenals/Urinary Tract: The adrenal glands are unremarkable. Unchanged small bilateral renal cysts. No renal calculi or hydronephrosis. Mild  circumferential bladder wall thickening. Stomach/Bowel: Progressive circumferential wall thickening cecum with enlarging mass anteriorly, currently measuring 3.9 x 3.7 cm, previously 2.9 x 3.1 cm. Surrounding inflammatory changes have resolved. Diffuse colonic diverticulosis. The stomach and small bowel are unremarkable. Vascular/Lymphatic: Aortic atherosclerosis. No enlarged abdominal or pelvic lymph nodes. Reproductive: Status post hysterectomy. No adnexal masses. Other: No free fluid or pneumoperitoneum. Unchanged small area of fat necrosis near the upper pole the right kidney. Musculoskeletal: No acute or significant osseous findings. IMPRESSION: Chest: 1. Large 5.7 x 5.6 cm mass in the anterior right upper lobe with invasion of the chest wall and right first and second ribs, consistent with primary bronchogenic carcinoma. 2. Ipsi- and contralateral nodal and pulmonary metastatic disease. Conglomerate mediastinal lymphadenopathy significantly narrows the SVC. 3. Severe T1 compression fracture, possibly pathologic. 4. UIP-type interstitial lung disease, progressed since 2014. 5. Aortic Atherosclerosis (ICD10-I70.0). Abdomen and pelvis: 1. Enlarging mass associated with the anterior cecum, currently measuring 3.9 x 3.7 cm, previously 2.9 x 3.1 cm. Surrounding inflammatory changes have resolved. Findings are concerning for primary colon cancer. Electronically Signed   By: Titus Dubin M.D.   On: 01/10/2020 15:45   DG Chest Port 1 View  Result Date: 01/10/2020 CLINICAL DATA:  Cough, sepsis EXAM: PORTABLE CHEST 1 VIEW COMPARISON:  Multiple prior studies most recent chest x-ray from 12/12/2019 FINDINGS: Findings in the RIGHT upper chest with increasing confluent density in this area measuring 8.7 x 5.7 cm. Nodule in the LEFT chest at 1.4 cm. Apical scarring. Diffuse increased interstitial markings superimposed on pulmonary emphysema. Signs of atherosclerosis. No evidence of pleural effusion. Cardiomediastinal  contours are normal. There is however fullness of the RIGHT hilum Absence of rib shadows of anterior ribs. No additional bony abnormality on limited assessment. IMPRESSION: 1. Suspected mass in the RIGHT upper lobe with anterior rib destruction and associated LEFT lower lobe nodule. CT of the chest is recommended for further evaluation. 2. Fullness of the RIGHT hilum raising the question of RIGHT hilar adenopathy. 3. Diffused increased interstitial markings could represent chronic lung disease, pneumonitis or lymphangitic tumor is also considered. These results were called by telephone at the time of interpretation on 01/10/2020 at 2:27 pm to provider [facility] , who verbally acknowledged these results. Electronically Signed   By: Zetta Bills M.D.   On: 01/10/2020 14:24    Procedures .Critical Care Performed by: Noemi Chapel, MD Authorized by: Noemi Chapel, MD   Critical care provider statement:    Critical care time (minutes):  35   Critical care time was exclusive of:  Separately billable procedures and treating other patients and teaching time   Critical care was necessary to treat or prevent imminent or life-threatening deterioration of the following conditions:  Sepsis   Critical care was time spent personally by me on the following activities:  Blood draw for specimens, development of treatment plan with patient or surrogate, discussions with consultants, evaluation of patient's response to  treatment, examination of patient, obtaining history from patient or surrogate, ordering and performing treatments and interventions, ordering and review of laboratory studies, ordering and review of radiographic studies, pulse oximetry, re-evaluation of patient's condition and review of old charts Comments:       Ultrasound ED Peripheral IV (Provider)  Date/Time: 01/10/2020 1:48 PM Performed by: Noemi Chapel, MD Authorized by: Noemi Chapel, MD   Procedure details:    Indications: hypotension  and poor IV access     Skin Prep: chlorhexidine gluconate     Location: left upper arm.   Angiocath:  20 G   Bedside Ultrasound Guided: Yes     Images: archived     Patient tolerated procedure without complications: Yes     Dressing applied: Yes   Comments:         (including critical care time)  Medications Ordered in ED Medications  lactated ringers bolus 1,000 mL (has no administration in time range)    And  lactated ringers bolus 1,000 mL (has no administration in time range)  cefTRIAXone (ROCEPHIN) 2 g in sodium chloride 0.9 % 100 mL IVPB (has no administration in time range)    ED Course  I have reviewed the triage vital signs and the nursing notes.  Pertinent labs & imaging results that were available during my care of the patient were reviewed by me and considered in my medical decision making (see chart for details).    MDM Rules/Calculators/A&P                          The patient actually has multiple sources of potential infection, she has coughing with rales, she is having swelling of her right arm with redness and warmth consistent with cellulitis and she has diffuse abdominal pain after having known diverticulitis with a perforation any of which were some combination of the 3 could be causing her symptoms.  She will be covered for sepsis, will treat with Zosyn given her potential for intra-abdominal infection which would also cover cellulitis and pneumonia.  The patient is agreeable, she will need IV fluids at 30 cc/kg given her borderline below blood pressure.  She is agreeable I reviewed the medical record at length including her recent hospitalization.  The patient is septic, she may have severe sepsis or septic shock, awaiting rest of labs to help determine the degree of illness  The patient's lactic acid is 1.6, her metabolic panel reveals mild hyponatremia as have the previous labs that she has had in the past.  It is concerning and that she has a severe  leukocytosis of 19,000 with leftward shift and neutrophil predominance.  Her last white blood cell count 2 days ago was 8.7  I was asked by nursing to place a peripheral IV as the patient has difficult access.  Prehospital the paramedic crew had placed an IV in the patient's right arm which should be restricted after breast cancer surgery and lymph node dissection.  It is also the arm that appears to be red and hot with cellulitis.  This will be removed now that she has 2 IVs working in the left upper extremity.  I have viewed the patient's chest x-ray, there is no signs of obvious infiltrate, there is also no signs of obvious free air.  She will need a CT scan to further evaluate for the cause of the fever which could be diverticulitis or abscess or perforation.  Antibiotics are running at this  time, cultures have been obtained, 30 cc/kg of IV fluids is also been ordered to treat for sepsis and severe sepsis or shock.  Thankfully at this time she has not had a MAP less than 60  This patient continued to show improvement with IV fluids and antibiotics, CT scan was interpreted and general surgery has seen the patient at the bedside recommending medical therapy as unlikely to have source in the abdomen causing the patient's sepsis.  The mass in the abdomen and the chest likely primary colon cancer with some metastatic disease in the chest possibly from the patient's colon.  The patient will need to be admitted to the hospital with ongoing antibiotics, she is critically ill  Final Clinical Impression(s) / ED Diagnoses Final diagnoses:  Sepsis, due to unspecified organism, unspecified whether acute organ dysfunction present [facility])      Noemi Chapel, MD 01/11/20 [phone]

## 2020-01-10 NOTE — Progress Notes (Signed)
Patient trnafered to 6N13 from [facility] ED. Alert and oriented x4/ Vital signs within normal limits. Right upper arm red and swollen elevated on pillow. Denies c/o pain. Oriented to room and remote.

## 2020-01-10 NOTE — Consult Note (Signed)
[place]  Reason for Consult: Abdominal pain, recent cecal diverticulitis  Referring Physician:  Dr. Sabra Heck (ED)   Chief Complaint    Code Sepsis      HPI: MELAINIE [name] is a 79 y.o. female with a recent admission for what we presumed to be cecal diverticulitis with thickening and perforation around the cecum a few weeks back. She only required IV antibiotics and went to rehab. I saw her and recommended follow up CT that was scheduled for this week but she was a no show. She says she was just released from the facility. She was brought by EMS for abdominal pain, fever, and right arm cellulitis. She denies any recent shots or anything at the Rehab.  She says she has continued to have lower abdominal pain and feels like she has as UTI. We had wanted to get the CT follow up to ensure no colovesical fistula formation. She had a colonoscopy in 2019 with Dr. Oneida Alar with diverticula and polyps removed.   She comes in today and CT is showing a lung lesion not really seen or reported prior on previous CXR and continued thickening of the cecal area but no obvious free air or perforation on my interpretation.  She has a history of breast cancer.   Past Medical History:  Diagnosis Date  . Breast CA ([place]) 01/16/2011  . Breast cancer ([place])    rt dx 2010; mastectomy only  . Diverticulitis   . Embolism - blood clot    left lung  . History of prolapse of bladder    patient has gellhorn pessary  . Hypertension   . Infiltrating ductal carcinoma of breast ([place]) 01/16/2011   Started Arimidex on 02/24/2009. Stage IB (T1b N0 M0) grade 1 infiltrating ductal carcinoma of the right breast, status post right modified radical mastectomy on 01/22/2009 for a 10 mm cancer, ER +100%, PR +98%, KI-67 marker was 9%, HER-2 negative, no LVI identified, and 11 lymph nodes were negative, all margins were clear. She had some associated DCIS, is now on Arimidex and she will take that for  . Stroke  ([place])   . Tendonitis 07/2010   shot of cortisone    Past Surgical History:  Procedure Laterality Date  . ABDOMINAL HYSTERECTOMY  1987  . APPENDECTOMY  1974   blood clot in lung  . BREAST BIOPSY  1968   left  . COLONOSCOPY N/A 01/11/2018   Procedure: COLONOSCOPY;  Surgeon: Danie Binder, MD;  Location: [facility];  Service: Endoscopy;  Laterality: N/A;  10:00  . MASTECTOMY MODIFIED RADICAL  01/22/2009   rt  . NASAL SINUS SURGERY  2002  . POLYPECTOMY  01/11/2018   Procedure: POLYPECTOMY;  Surgeon: Danie Binder, MD;  Location: [facility];  Service: Endoscopy;;  ascending; descending;  . TEAR DUCT PROBING    . tubal ligation  1972  . YAG LASER APPLICATION Right 0/98/1191   Procedure: YAG LASER APPLICATION;  Surgeon: Williams Che, MD;  Location: [facility] ORS;  Service: Ophthalmology;  Laterality: Right;    Family History  Problem Relation Age of Onset  . Breast cancer Sister   . Breast cancer Maternal Grandmother   . Colon cancer Daughter 48  . Hypertension Paternal Grandfather   . Stroke Paternal Grandfather   . Other Paternal Grandmother        hardening of arteries  . Other Father        gun shot wound  . Cancer Sister   .  Cancer Daughter   . Stroke Son   . Heart attack Son   . Gastric cancer Neg Hx   . Esophageal cancer Neg Hx     Social History   Tobacco Use  . Smoking status: Current Every Day Smoker    Packs/day: 0.50    Years: 62.00    Pack years: 31.00    Types: Cigarettes  . Smokeless tobacco: Never Used  Vaping Use  . Vaping Use: Never used  Substance Use Topics  . Alcohol use: Yes    Comment: occ  . Drug use: No    Medications: I have reviewed the patient's current medications. Current Facility-Administered Medications  Medication Dose Route Frequency Provider Last Rate Last Admin  . lactated ringers bolus 1,000 mL  1,000 mL Intravenous Once Noemi Chapel, MD       Current Outpatient Medications  Medication Sig Dispense Refill Last Dose   . acetaminophen (TYLENOL) 650 MG CR tablet Take 650 mg by mouth as needed for pain.     Marland Kitchen albuterol (PROVENTIL HFA;VENTOLIN HFA) 108 (90 Base) MCG/ACT inhaler Inhale 1 puff into the lungs every 6 (six) hours as needed for wheezing or shortness of breath.     Marland Kitchen aspirin EC 325 MG tablet Take 325 mg by mouth daily.     . calcium-vitamin D (OSCAL WITH D) 500-200 MG-UNIT TABS tablet Take 1 tablet by mouth in the morning, at noon, and at bedtime.     . ferrous sulfate 325 (65 FE) MG tablet Take 325 mg by mouth daily with breakfast.     . fluticasone (FLONASE) 50 MCG/ACT nasal spray Place 2 sprays into both nostrils as needed.      . gabapentin (NEURONTIN) 600 MG tablet Take 1 tablet (600 mg total) by mouth 3 (three) times daily. 90 tablet 0   . lovastatin (MEVACOR) 40 MG tablet Take 40 mg by mouth at bedtime.      . metoprolol tartrate (LOPRESSOR) 25 MG tablet Take 0.5 tablets (12.5 mg total) by mouth 2 (two) times daily.     . metroNIDAZOLE (METROGEL) 0.75 % vaginal gel Place 1 Applicatorful vaginally once a week. one applicatorful to vagina at bedtime weekly , as needed for vaginal discharge with pessary 70 g 4   . naphazoline-glycerin (CLEAR EYES) 0.012-0.2 % SOLN Place 2 drops into both eyes daily as needed for irritation.     Marland Kitchen omeprazole (PRILOSEC) 20 MG capsule Take 1 capsule by mouth daily.     . QUEtiapine (SEROQUEL) 25 MG tablet Take 0.5 tablets (12.5 mg total) by mouth at bedtime.     . SYMBICORT 160-4.5 MCG/ACT inhaler Inhale 2 puffs into the lungs as needed.      . tiotropium (SPIRIVA) 18 MCG inhalation capsule Place 18 mcg into inhaler and inhale daily as needed.     . TRADJENTA 5 MG TABS tablet Take 5 mg by mouth daily.        Allergies  Allergen Reactions  . Pollen Extract Other (See Comments)    Runny nose, watery eyes and ears get stopped up  . Red Dye      ROS:  A comprehensive review of systems was negative except for: Constitutional: positive for fevers Gastrointestinal:  positive for abdominal pain Genitourinary: positive for dysuria Musculoskeletal: positive for right arm pain, swelling and erythema  Blood pressure 92/73, pulse (!) 107, temperature (!) 100.7 F (38.2 C), temperature source Rectal, resp. rate (!) 21, height _0  (1.651 m), weight 63 kg,  SpO2 99 %. Physical Exam Vitals reviewed.  Constitutional:      Appearance: She is normal weight.  HENT:     Head: Normocephalic.     Nose: Nose normal.     Mouth/Throat:     Mouth: Mucous membranes are moist.  Eyes:     Pupils: Pupils are equal, round, and reactive to light.  Cardiovascular:     Rate and Rhythm: Tachycardia present.  Pulmonary:     Effort: Pulmonary effort is normal.  Abdominal:     General: There is no distension.     Palpations: Abdomen is soft.     Tenderness: There is abdominal tenderness in the right lower quadrant and suprapubic area. There is guarding. There is no rebound.     Comments: Local guarding on the right, distractable, abdomen soft and non tender otherwise, lets me do relatively deep palpation with distraction  Musculoskeletal:        General: Normal range of motion.     Cervical back: Normal range of motion.     Comments: Right arm cellulitis and swelling  Skin:    General: Skin is warm.  Neurological:     General: No focal deficit present.     Mental Status: She is alert and oriented to person, place, and time.  Psychiatric:        Mood and Affect: Mood normal.        Behavior: Behavior normal.        Thought Content: Thought content normal.        Judgment: Judgment normal.     Results: Results for orders placed or performed during the hospital encounter of 01/10/20 (from the past 48 hour(s))  Lactic acid, plasma     Status: None   Collection Time: 01/10/20  1:15 PM  Result Value Ref Range   Lactic Acid, Venous 1.6 0.5 - 1.9 mmol/L    Comment: Performed at [facility], [address] [place], [place] [address]  Comprehensive metabolic panel      Status: Abnormal   Collection Time: 01/10/20  1:15 PM  Result Value Ref Range   Sodium 132 (L) 135 - 145 mmol/L   Potassium 4.1 3.5 - 5.1 mmol/L   Chloride 97 (L) 98 - 111 mmol/L   CO2 25 22 - 32 mmol/L   Glucose, Bld 156 (H) 70 - 99 mg/dL    Comment: Glucose reference range applies only to samples taken after fasting for at least 8 hours.   BUN 10 8 - 23 mg/dL   Creatinine, Ser 0.84 0.44 - 1.00 mg/dL   Calcium 9.1 8.9 - 10.3 mg/dL   Total Protein 7.0 6.5 - 8.1 g/dL   Albumin 3.4 (L) 3.5 - 5.0 g/dL   AST 13 (L) 15 - 41 U/L   ALT 9 0 - 44 U/L   Alkaline Phosphatase 43 38 - 126 U/L   Total Bilirubin 0.9 0.3 - 1.2 mg/dL   GFR calc non Af Amer >60 >60 mL/min   GFR calc Af Amer >60 >60 mL/min   Anion gap 10 5 - 15    Comment: Performed at [facility], [address], [place], [place] [address]  CBC WITH DIFFERENTIAL     Status: Abnormal   Collection Time: 01/10/20  1:15 PM  Result Value Ref Range   WBC 19.0 (H) 4.0 - 10.5 K/uL   RBC 4.06 3.87 - 5.11 MIL/uL   Hemoglobin 12.2 12.0 - 15.0 g/dL   HCT 38.1 36 -  46 %   MCV 93.8 80.0 - 100.0 fL   MCH 30.0 26.0 - 34.0 pg   MCHC 32.0 30.0 - 36.0 g/dL   RDW 16.4 (H) 11.5 - 15.5 %   Platelets 177 150 - 400 K/uL   nRBC 0.0 0.0 - 0.2 %   Neutrophils Relative % 92 %   Neutro Abs 17.4 (H) 1.7 - 7.7 K/uL   Lymphocytes Relative 4 %   Lymphs Abs 0.8 0.7 - 4.0 K/uL   Monocytes Relative 3 %   Monocytes Absolute 0.6 0 - 1 K/uL   Eosinophils Relative 0 %   Eosinophils Absolute 0.0 0 - 0 K/uL   Basophils Relative 0 %   Basophils Absolute 0.0 0 - 0 K/uL   Immature Granulocytes 1 %   Abs Immature Granulocytes 0.10 (H) 0.00 - 0.07 K/uL    Comment: Performed at [facility], [address], [place], [place] [address]  APTT     Status: None   Collection Time: 01/10/20  1:15 PM  Result Value Ref Range   aPTT 31 24 - 36 seconds    Comment: Performed at [facility], [address], [place], [place] [address]  Protime-INR     Status: None    Collection Time: 01/10/20  1:15 PM  Result Value Ref Range   Prothrombin Time 13.2 11.4 - 15.2 seconds   INR 1.0 0.8 - 1.2    Comment: (NOTE) INR goal varies based on device and disease states. Performed at [facility], [address], [place], [place] [address]   Blood Culture (routine x 2)     Status: None (Preliminary result)   Collection Time: 01/10/20  1:15 PM   Specimen: Left Antecubital; Blood  Result Value Ref Range   Specimen Description LEFT ANTECUBITAL    Special Requests      BOTTLES DRAWN AEROBIC AND ANAEROBIC Blood Culture adequate volume Performed at [facility], [address], [place], [place] [address]    Culture PENDING    Report Status PENDING   Blood Culture (routine x 2)     Status: None (Preliminary result)   Collection Time: 01/10/20  1:47 PM   Specimen: BLOOD LEFT ARM  Result Value Ref Range   Specimen Description BLOOD LEFT ARM BOTTLES DRAWN AEROBIC AND ANAEROBIC    Special Requests      Blood Culture adequate volume Performed at [facility], [address] [place], [place] [address]    Culture PENDING    Report Status PENDING   Urinalysis, Routine w reflex microscopic     Status: Abnormal   Collection Time: 01/10/20  2:00 PM  Result Value Ref Range   Color, Urine AMBER (A) YELLOW    Comment: BIOCHEMICALS MAY BE AFFECTED BY COLOR   APPearance CLOUDY (A) CLEAR   Specific Gravity, Urine 1.014 1.005 - 1.030   pH 7.0 5.0 - 8.0   Glucose, UA NEGATIVE NEGATIVE mg/dL   Hgb urine dipstick NEGATIVE NEGATIVE   Bilirubin Urine NEGATIVE NEGATIVE   Ketones, ur NEGATIVE NEGATIVE mg/dL   Protein, ur 30 (A) NEGATIVE mg/dL   Nitrite POSITIVE (A) NEGATIVE   Leukocytes,Ua MODERATE (A) NEGATIVE   RBC / HPF 11-20 0 - 5 RBC/hpf   WBC, UA >50 (H) 0 - 5 WBC/hpf   Bacteria, UA FEW (A) NONE SEEN   WBC Clumps PRESENT    Budding Yeast PRESENT     Comment: Performed at [facility], [address], [place], [place] [address]  SARS Coronavirus  2 by RT PCR (hospital  order, performed in [facility] hospital lab) Nasopharyngeal Nasopharyngeal Swab     Status: None   Collection Time: 01/10/20  2:00 PM   Specimen: Nasopharyngeal Swab  Result Value Ref Range   SARS Coronavirus 2 NEGATIVE NEGATIVE    Comment: (NOTE) SARS-CoV-2 target nucleic acids are NOT DETECTED.  The SARS-CoV-2 RNA is generally detectable in upper and lower respiratory specimens during the acute phase of infection. The lowest concentration of SARS-CoV-2 viral copies this assay can detect is 250 copies / mL. A negative result does not preclude SARS-CoV-2 infection and should not be used as the sole basis for treatment or other patient management decisions.  A negative result may occur with improper specimen collection / handling, submission of specimen other than nasopharyngeal swab, presence of viral mutation(s) within the areas targeted by this assay, and inadequate number of viral copies (<250 copies / mL). A negative result must be combined with clinical observations, patient history, and epidemiological information.  Fact Sheet for Patients:   StrictlyIdeas.no  Fact Sheet for Healthcare Providers: BankingDealers.co.za  This test is not yet approved or  cleared by the [place] FDA and has been authorized for detection and/or diagnosis of SARS-CoV-2 by FDA under an Emergency Use Authorization (EUA).  This EUA will remain in effect (meaning this test can be used) for the duration of the COVID-19 declaration under Section 564(b)(1) of the Act, 21 U.S.C. section 360bbb-3(b)(1), unless the authorization is terminated or revoked sooner.  Performed at [facility], [address], [place], [place] [address]    Personally reviewed CT- not read yet/ cecal thickening and diverticula in area, no signs of stranding or inflammation compared to the prior in May, overall looks less inflamed, right lung mass noted  DG Chest Port 1  View  Result Date: 01/10/2020 CLINICAL DATA:  Cough, sepsis EXAM: PORTABLE CHEST 1 VIEW COMPARISON:  Multiple prior studies most recent chest x-ray from 12/12/2019 FINDINGS: Findings in the RIGHT upper chest with increasing confluent density in this area measuring 8.7 x 5.7 cm. Nodule in the LEFT chest at 1.4 cm. Apical scarring. Diffuse increased interstitial markings superimposed on pulmonary emphysema. Signs of atherosclerosis. No evidence of pleural effusion. Cardiomediastinal contours are normal. There is however fullness of the RIGHT hilum Absence of rib shadows of anterior ribs. No additional bony abnormality on limited assessment. IMPRESSION: 1. Suspected mass in the RIGHT upper lobe with anterior rib destruction and associated LEFT lower lobe nodule. CT of the chest is recommended for further evaluation. 2. Fullness of the RIGHT hilum raising the question of RIGHT hilar adenopathy. 3. Diffused increased interstitial markings could represent chronic lung disease, pneumonitis or lymphangitic tumor is also considered. These results were called by telephone at the time of interpretation on 01/10/2020 at 2:27 pm to provider [facility] , who verbally acknowledged these results. Electronically Signed   By: Zetta Bills M.D.   On: 01/10/2020 14:24    Assessment & Plan:  [name] is a 79 y.o. female with recent admission for suspected cecal diverticulitis with continued thickening of the cecum but no obvious perforation and decreased inflammation and stranding. She had a colonoscopy in 2019 so we were not really suspicious of any cancer at that time. Right lung cancer noted and history of breast cancer in the past. Will need this worked up. Right arm cellulitis /edema of unknown origin?  UA also with nitrites, LE, so UTI also.   -No acute surgical intervention  for the cecal diverticulitis/ thickening, may ultimately need a colonoscopy to confirm this is just diverticulitis and not a cancer /  primary -Would cover for diverticulitis with antibiotics and cellulitis -Clear diet as tolerated and will adv  -Hospitalist admission   All questions were answered to the satisfaction of the patient. Discussed with Dr. Sabra Heck.    Virl Cagey 01/10/2020, 3:43 PM
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
## 2020-01-10 NOTE — ED Triage Notes (Signed)
Pt discharged from [facility] in [place]. After 3 weeks at [facility].  [facility] nurse went to see pt she had fever, hypotensive, tachycardia. Pt has abdominal pain and right arm is red

## 2020-01-10 NOTE — Plan of Care (Signed)
  Problem: Education: Goal: Knowledge of General Education information will improve Description Including pain rating scale, medication(s)/side effects and non-pharmacologic comfort measures Outcome: Progressing   

## 2020-01-11 ENCOUNTER — Inpatient Hospital Stay (HOSPITAL_COMMUNITY): Payer: Medicare HMO

## 2020-01-11 DIAGNOSIS — M79601 Pain in right arm: Secondary | ICD-10-CM

## 2020-01-11 DIAGNOSIS — C50911 Malignant neoplasm of unspecified site of right female breast: Secondary | ICD-10-CM

## 2020-01-11 DIAGNOSIS — R2231 Localized swelling, mass and lump, right upper limb: Secondary | ICD-10-CM

## 2020-01-11 DIAGNOSIS — L03113 Cellulitis of right upper limb: Secondary | ICD-10-CM

## 2020-01-11 DIAGNOSIS — K5732 Diverticulitis of large intestine without perforation or abscess without bleeding: Secondary | ICD-10-CM

## 2020-01-11 LAB — BLOOD CULTURE ID PANEL (REFLEXED)

## 2020-01-11 LAB — GLUCOSE, CAPILLARY
Glucose-Capillary: 117 mg/dL — ABNORMAL HIGH (ref 70–99)
Glucose-Capillary: 149 mg/dL — ABNORMAL HIGH (ref 70–99)
Glucose-Capillary: 200 mg/dL — ABNORMAL HIGH (ref 70–99)
Glucose-Capillary: 184 mg/dL — ABNORMAL HIGH (ref 70–99)

## 2020-01-11 MED ORDER — SODIUM CHLORIDE 0.9 % IV SOLN: INTRAVENOUS | Status: DC

## 2020-01-11 MED ORDER — DEXAMETHASONE SODIUM PHOSPHATE 4 MG/ML IJ SOLN
4.0000 mg | INTRAMUSCULAR | Status: DC
  Administered 2020-01-11 – 2020-01-20 (×9): 4 mg via INTRAVENOUS
  Filled 2020-01-11 (×10): qty 1

## 2020-01-11 MED ORDER — HYDROMORPHONE HCL 1 MG/ML IJ SOLN: 1.0000 mg | INTRAMUSCULAR | Status: DC | PRN

## 2020-01-11 MED ORDER — HEPARIN SODIUM (PORCINE) 5000 UNIT/ML IJ SOLN
5000.0000 [IU] | Freq: Three times a day (TID) | INTRAMUSCULAR | Status: AC
  Administered 2020-01-11: 5000 [IU] via SUBCUTANEOUS
  Filled 2020-01-11: qty 1

## 2020-01-11 NOTE — Progress Notes (Signed)
VASCULAR LAB PRELIMINARY  PRELIMINARY  PRELIMINARY  PRELIMINARY  Right upper extremity venous duplex completed.    Preliminary report:  See CV proc for preliminary results.   Eleftherios Dudenhoeffer, RVT 01/11/2020, 11:11 AM

## 2020-01-11 NOTE — Progress Notes (Addendum)
PROGRESS NOTE    [name]  ONG:[id number] DOB: July 06, 1941 DOA: 01/10/2020 PCP: Rosita Fire, MD  Brief Narrative: This is an 79 year old female with history of diabetes mellitus, hypertension, dyslipidemia, COPD, tobacco abuse was admitted with cecal diverticulitis and perforation from 5/14 through 5/21 to [facility], treated with antibiotics, did not require a drain or surgery. -Was discharged to SNF for rehabilitation and was also followed by surgery following discharge. -Presented to the ED at [facility] with worsening abdominal pain, weakness, found to have new large RUL Lung mass, extensive adenopathy, early SVC syndrome and continued thickening of the cecal area, radiologist raises suspicion for malignancy.  Assessment & Plan:   Sepsis Gram-negative rod bacteremia Recent cecal perforation/presumed diverticulitis -Source is likely cecal, recent perforation/treated for diverticulitis with antibiotics 5/14-21 -continue IVF and IV Zosyn today -CT reports possible cecal mass, or this could be residual inflammation following recent perforation less likely additional malignancy -Colonoscopy in 2019 noted diverticulosis and multiple polyps which were removed -Will need GI evaluation and repeat colonoscopy in the near future when more stable  Right upper lobe lung mass Extensive mediastinal lymphadenopathy Early SVC syndrome, right arm swelling -Pulmonary consult, discussed with Dr. Valeta Harms, greatly appreciate input -Start IV Decadron and fluids -Tentative plan for bronchoscopy/biopsy tomorrow -Also consulted, discussed with radiation oncology Dr. Sondra Come, Rad Onc will see her at [place] after transfer,  Addendum: d/w Pulm again, reviewed images and felt mass invading chest wall could be accessed percutaneously, will request IR consult and keep NPO after MN  History of stage I breast CA -Treated with mastectomy in 2010, followed by [place]  COPD Longstanding tobacco abuse -Concern for interstitial lung disease on CT chest  -Stable, nebs as needed -Monitor  Diabetes mellitus -Hold oral hypoglycemics, sliding scale insulin for now  Right arm swelling -Due to SVC syndrome  DVT prophylaxis: Heparin, hold after today's dose Code Status: DNR Family Communication: No family at bedside, unable to reach granddaughter, attempted twice, able to call and update grandson [name] Disposition Plan:  Status is: Inpatient  Remains inpatient appropriate because:Inpatient level of care appropriate due to severity of illness   Dispo: The patient is from: Home              Anticipated d/c is to: SNF              Anticipated d/c date is: > 3 days              Patient currently is not medically stable to d/c.   Consultants:   Pulm  Rad Onc Dr.Kinard   Procedures:   Antimicrobials:    Subjective: -Continues to have some abdominal pain, denies dyspnea  Objective: Vitals:   01/10/20 2111 01/11/20 0055 01/11/20 0454 01/11/20 0803  BP: 127/64 (!) 113/53 (!) 117/56 (!) 151/66  Pulse: 66 84 84 85  Resp: 20 20 18 17   Temp: 98.5 F (36.9 C) (!) 96.6 F (35.9 C) 98.5 F (36.9 C) 98.2 F (36.8 C)  TempSrc: Oral Axillary Oral Oral  SpO2: 96% 99% 98% 96%  Weight:      Height:        Intake/Output Summary (Last 24 hours) at 01/11/2020 1037 Last data filed at 01/11/2020 0300 Gross per 24 hour  Intake 773.59 ml  Output --  Net 773.59 ml   Filed Weights   01/10/20 1303  Weight: 63 kg    Examination:  General exam: Elderly frail female sitting up  in bed, AAO x3 Respiratory system: Poor air movement bilaterally, few scattered rhonchi Cardiovascular system: S1 & S2 heard, RRR.  Gastrointestinal system: Soft, mild diffuse tenderness, bowel sounds diminished Central nervous system: Alert and oriented. No focal neurological deficits. Extremities: Right arm swelling Skin: No rashes on exposed  skin Psychiatry: Judgement and insight appear normal. Mood & affect appropriate.     Data Reviewed:   CBC: Recent Labs  Lab 01/08/20 1357 01/10/20 1315  WBC 8.7 19.0*  NEUTROABS 6.6 17.4*  HGB 12.0 12.2  HCT 38.3 38.1  MCV 95.5 93.8  PLT 210 458   Basic Metabolic Panel: Recent Labs  Lab 01/08/20 1357 01/10/20 1315  NA 131* 132*  K 4.6 4.1  CL 97* 97*  CO2 23 25  GLUCOSE 110* 156*  BUN 9 10  CREATININE 0.76 0.84  CALCIUM 9.8 9.1   GFR: Estimated Creatinine Clearance: 48.1 mL/min (by C-G formula based on SCr of 0.84 mg/dL). Liver Function Tests: Recent Labs  Lab 01/08/20 1357 01/10/20 1315  AST 18 13*  ALT 10 9  ALKPHOS 41 43  BILITOT 0.3 0.9  PROT 6.8 7.0  ALBUMIN 3.5 3.4*   No results for input(s): LIPASE, AMYLASE in the last 168 hours. No results for input(s): AMMONIA in the last 168 hours. Coagulation Profile: Recent Labs  Lab 01/10/20 1315  INR 1.0   Cardiac Enzymes: No results for input(s): CKTOTAL, CKMB, CKMBINDEX, TROPONINI in the last 168 hours. BNP (last 3 results) No results for input(s): PROBNP in the last 8760 hours. HbA1C: No results for input(s): HGBA1C in the last 72 hours. CBG: Recent Labs  Lab 01/10/20 1710 01/10/20 2134 01/11/20 0753  GLUCAP 124* 103* 117*   Lipid Profile: No results for input(s): CHOL, HDL, LDLCALC, TRIG, CHOLHDL, LDLDIRECT in the last 72 hours. Thyroid Function Tests: No results for input(s): TSH, T4TOTAL, FREET4, T3FREE, THYROIDAB in the last 72 hours. Anemia Panel: Recent Labs    01/08/20 1357  VITAMINB12 322  FERRITIN 161  TIBC 186*  IRON 57   Urine analysis:    Component Value Date/Time   COLORURINE AMBER (A) 01/10/2020 1400   APPEARANCEUR CLOUDY (A) 01/10/2020 1400   LABSPEC 1.014 01/10/2020 1400   PHURINE 7.0 01/10/2020 1400   GLUCOSEU NEGATIVE 01/10/2020 1400   HGBUR NEGATIVE 01/10/2020 1400   BILIRUBINUR NEGATIVE 01/10/2020 1400   KETONESUR NEGATIVE 01/10/2020 1400   PROTEINUR 30  (A) 01/10/2020 1400   UROBILINOGEN 0.2 08/02/2013 1440   NITRITE POSITIVE (A) 01/10/2020 1400   LEUKOCYTESUR MODERATE (A) 01/10/2020 1400   Sepsis Labs: @LABRCNTIP (procalcitonin:4,lacticidven:4)  ) Recent Results (from the past 240 hour(s))  Blood Culture (routine x 2)     Status: None (Preliminary result)   Collection Time: 01/10/20  1:15 PM   Specimen: BLOOD  Result Value Ref Range Status   Specimen Description   Final    BLOOD LEFT ANTECUBITAL Performed at [facility], [place] [address] [place], [place] [address]    Special Requests   Final    BOTTLES DRAWN AEROBIC AND ANAEROBIC Blood Culture adequate volume Performed at [facility], [address] [place], [place] [address]    Culture  Setup Time   Final    GRAM NEGATIVE RODS Gram Stain Report Called to,Read Back By and Verified With: SAPPELT,J @ 5397 ON 01/11/20 BY JUW AEROBIC BOTTLE ONLY GS DONE @ [facility] GRAM NEGATIVE RODS ANAEROBIC BOTTLE ONLY Gram Stain Report Called to,Read Back By and Verified With: PREVIOUSLY CALLED @511  01/11/2020 KAY  CRITICAL RESULT CALLED TO, READ BACK BY AND VERIFIED WITH: [facility] MIKE M [phone] [id number] FCP Performed at [facility], [address], [place], [place] [address]    Culture GRAM NEGATIVE RODS  Final   Report Status PENDING  Incomplete  Blood Culture ID Panel (Reflexed)     Status: None   Collection Time: 01/10/20  1:15 PM  Result Value Ref Range Status   Enterococcus species NOT DETECTED NOT DETECTED Final   Listeria monocytogenes NOT DETECTED NOT DETECTED Final   Staphylococcus species NOT DETECTED NOT DETECTED Final   Staphylococcus aureus (BCID) NOT DETECTED NOT DETECTED Final   Streptococcus species NOT DETECTED NOT DETECTED Final   Streptococcus agalactiae NOT DETECTED NOT DETECTED Final   Streptococcus pneumoniae NOT DETECTED NOT DETECTED Final   Streptococcus pyogenes NOT DETECTED NOT DETECTED Final   Acinetobacter baumannii NOT DETECTED NOT DETECTED Final    Enterobacteriaceae species NOT DETECTED NOT DETECTED Final   Enterobacter cloacae complex NOT DETECTED NOT DETECTED Final   Escherichia coli NOT DETECTED NOT DETECTED Final   Klebsiella oxytoca NOT DETECTED NOT DETECTED Final   Klebsiella pneumoniae NOT DETECTED NOT DETECTED Final   Proteus species NOT DETECTED NOT DETECTED Final   Serratia marcescens NOT DETECTED NOT DETECTED Final   Haemophilus influenzae NOT DETECTED NOT DETECTED Final   Neisseria meningitidis NOT DETECTED NOT DETECTED Final   Pseudomonas aeruginosa NOT DETECTED NOT DETECTED Final   Candida albicans NOT DETECTED NOT DETECTED Final   Candida glabrata NOT DETECTED NOT DETECTED Final   Candida krusei NOT DETECTED NOT DETECTED Final   Candida parapsilosis NOT DETECTED NOT DETECTED Final   Candida tropicalis NOT DETECTED NOT DETECTED Final    Comment: Performed at [facility], [place]. [address] [place], [place] [address]  Blood Culture (routine x 2)     Status: None (Preliminary result)   Collection Time: 01/10/20  1:47 PM   Specimen: Vein; Blood  Result Value Ref Range Status   Specimen Description BLOOD LEFT ARM BOTTLES DRAWN AEROBIC AND ANAEROBIC  Final   Special Requests   Final    Blood Culture adequate volume Performed at [facility], [address], [place], [place] [address]    Culture PENDING  Incomplete   Report Status PENDING  Incomplete  SARS Coronavirus 2 by RT PCR (hospital order, performed in [place] hospital lab) Nasopharyngeal Nasopharyngeal Swab     Status: None   Collection Time: 01/10/20  2:00 PM   Specimen: Nasopharyngeal Swab  Result Value Ref Range Status   SARS Coronavirus 2 NEGATIVE NEGATIVE Final    Comment: (NOTE) SARS-CoV-2 target nucleic acids are NOT DETECTED.  The SARS-CoV-2 RNA is generally detectable in upper and lower respiratory specimens during the acute phase of infection. The lowest concentration of SARS-CoV-2 viral copies this assay can detect is 250 copies / mL. A  negative result does not preclude SARS-CoV-2 infection and should not be used as the sole basis for treatment or other patient management decisions.  A negative result may occur with improper specimen collection / handling, submission of specimen other than nasopharyngeal swab, presence of viral mutation(s) within the areas targeted by this assay, and inadequate number of viral copies (<250 copies / mL). A negative result must be combined with clinical observations, patient history, and epidemiological information.  Fact Sheet for Patients:   StrictlyIdeas.no  Fact Sheet for Healthcare Providers: BankingDealers.co.za  This test is not yet approved or  cleared by the [place] FDA  and has been authorized for detection and/or diagnosis of SARS-CoV-2 by FDA under an Emergency Use Authorization (EUA).  This EUA will remain in effect (meaning this test can be used) for the duration of the COVID-19 declaration under Section 564(b)(1) of the Act, 21 U.S.C. section 360bbb-3(b)(1), unless the authorization is terminated or revoked sooner.  Performed at [facility], [address], [place], [place] [address]          Radiology Studies: CT Chest W Contrast  Result Date: 01/10/2020 CLINICAL DATA:  Fever, hypotension, and abdominal pain. EXAM: CT CHEST, ABDOMEN, AND PELVIS WITH CONTRAST TECHNIQUE: Multidetector CT imaging of the chest, abdomen and pelvis was performed following the standard protocol during bolus administration of intravenous contrast. CONTRAST:  140mL OMNIPAQUE IOHEXOL 300 MG/ML  SOLN COMPARISON:  Chest x-ray from same day. CT abdomen pelvis dated Dec 15, 2019. CT chest dated August 26, 2012. FINDINGS: CT CHEST FINDINGS Cardiovascular: Normal heart size. No pericardial effusion. No thoracic aortic aneurysm or dissection. Coronary, aortic arch, and branch vessel atherosclerotic vascular disease. No central pulmonary embolism.  Mediastinum/Nodes: Conglomerate mediastinal lymphadenopathy measures 6.2 x 3.4 cm and invades and severely narrows the SVC. Enlarged high right paratracheal lymph node measuring 1.9 cm in short axis. Enlarged bilateral hilar lymph nodes measuring up to 1.8 cm in short axis. No enlarged axillary lymph nodes. Prominent 8 mm right internal mamillary lymph node (series 2, image 18). Prior right axillary lymph node dissection. Subcentimeter hypodense nodules in both thyroid lobes. Not clinically significant; no follow-up imaging recommended. The trachea and esophagus demonstrate no significant findings. Lungs/Pleura: Large 5.7 x 5.6 cm mass in the anterior right upper lobe with invasion of the chest wall and right first and second ribs. Focal narrowing of the right subclavian vein just superior to the mass. Small satellite nodules in the right upper lobe measuring up the mm (series 3, image 54). Multiple nodules in the left upper lobe measuring up to 2.2 cm (series 3, image 68). 9 mm nodule in the left lower lobe (series 3, image 100). Progressive basilar predominant peripheral subpleural reticulation and honeycombing throughout both lungs. No focal consolidation, pleural effusion, or pneumothorax. Musculoskeletal: Direct invasion of the right first and second ribs as above. Severe T1 compression fracture. CT ABDOMEN PELVIS FINDINGS Hepatobiliary: No focal liver abnormality is seen. No gallstones, gallbladder wall thickening, or biliary dilatation. Pancreas: Unremarkable. No pancreatic ductal dilatation or surrounding inflammatory changes. Spleen: Normal in size without focal abnormality. Adrenals/Urinary Tract: The adrenal glands are unremarkable. Unchanged small bilateral renal cysts. No renal calculi or hydronephrosis. Mild circumferential bladder wall thickening. Stomach/Bowel: Progressive circumferential wall thickening cecum with enlarging mass anteriorly, currently measuring 3.9 x 3.7 cm, previously 2.9 x 3.1 cm.  Surrounding inflammatory changes have resolved. Diffuse colonic diverticulosis. The stomach and small bowel are unremarkable. Vascular/Lymphatic: Aortic atherosclerosis. No enlarged abdominal or pelvic lymph nodes. Reproductive: Status post hysterectomy. No adnexal masses. Other: No free fluid or pneumoperitoneum. Unchanged small area of fat necrosis near the upper pole the right kidney. Musculoskeletal: No acute or significant osseous findings. IMPRESSION: Chest: 1. Large 5.7 x 5.6 cm mass in the anterior right upper lobe with invasion of the chest wall and right first and second ribs, consistent with primary bronchogenic carcinoma. 2. Ipsi- and contralateral nodal and pulmonary metastatic disease. Conglomerate mediastinal lymphadenopathy significantly narrows the SVC. 3. Severe T1 compression fracture, possibly pathologic. 4. UIP-type interstitial lung disease, progressed since 2014. 5. Aortic Atherosclerosis (ICD10-I70.0). Abdomen and pelvis: 1. Enlarging mass associated with the  anterior cecum, currently measuring 3.9 x 3.7 cm, previously 2.9 x 3.1 cm. Surrounding inflammatory changes have resolved. Findings are concerning for primary colon cancer. Electronically Signed   By: Titus Dubin M.D.   On: 01/10/2020 15:45   CT ABDOMEN PELVIS W CONTRAST  Result Date: 01/10/2020 CLINICAL DATA:  Fever, hypotension, and abdominal pain. EXAM: CT CHEST, ABDOMEN, AND PELVIS WITH CONTRAST TECHNIQUE: Multidetector CT imaging of the chest, abdomen and pelvis was performed following the standard protocol during bolus administration of intravenous contrast. CONTRAST:  138mL OMNIPAQUE IOHEXOL 300 MG/ML  SOLN COMPARISON:  Chest x-ray from same day. CT abdomen pelvis dated Dec 15, 2019. CT chest dated August 26, 2012. FINDINGS: CT CHEST FINDINGS Cardiovascular: Normal heart size. No pericardial effusion. No thoracic aortic aneurysm or dissection. Coronary, aortic arch, and branch vessel atherosclerotic vascular disease. No  central pulmonary embolism. Mediastinum/Nodes: Conglomerate mediastinal lymphadenopathy measures 6.2 x 3.4 cm and invades and severely narrows the SVC. Enlarged high right paratracheal lymph node measuring 1.9 cm in short axis. Enlarged bilateral hilar lymph nodes measuring up to 1.8 cm in short axis. No enlarged axillary lymph nodes. Prominent 8 mm right internal mamillary lymph node (series 2, image 18). Prior right axillary lymph node dissection. Subcentimeter hypodense nodules in both thyroid lobes. Not clinically significant; no follow-up imaging recommended. The trachea and esophagus demonstrate no significant findings. Lungs/Pleura: Large 5.7 x 5.6 cm mass in the anterior right upper lobe with invasion of the chest wall and right first and second ribs. Focal narrowing of the right subclavian vein just superior to the mass. Small satellite nodules in the right upper lobe measuring up the mm (series 3, image 54). Multiple nodules in the left upper lobe measuring up to 2.2 cm (series 3, image 68). 9 mm nodule in the left lower lobe (series 3, image 100). Progressive basilar predominant peripheral subpleural reticulation and honeycombing throughout both lungs. No focal consolidation, pleural effusion, or pneumothorax. Musculoskeletal: Direct invasion of the right first and second ribs as above. Severe T1 compression fracture. CT ABDOMEN PELVIS FINDINGS Hepatobiliary: No focal liver abnormality is seen. No gallstones, gallbladder wall thickening, or biliary dilatation. Pancreas: Unremarkable. No pancreatic ductal dilatation or surrounding inflammatory changes. Spleen: Normal in size without focal abnormality. Adrenals/Urinary Tract: The adrenal glands are unremarkable. Unchanged small bilateral renal cysts. No renal calculi or hydronephrosis. Mild circumferential bladder wall thickening. Stomach/Bowel: Progressive circumferential wall thickening cecum with enlarging mass anteriorly, currently measuring 3.9 x 3.7  cm, previously 2.9 x 3.1 cm. Surrounding inflammatory changes have resolved. Diffuse colonic diverticulosis. The stomach and small bowel are unremarkable. Vascular/Lymphatic: Aortic atherosclerosis. No enlarged abdominal or pelvic lymph nodes. Reproductive: Status post hysterectomy. No adnexal masses. Other: No free fluid or pneumoperitoneum. Unchanged small area of fat necrosis near the upper pole the right kidney. Musculoskeletal: No acute or significant osseous findings. IMPRESSION: Chest: 1. Large 5.7 x 5.6 cm mass in the anterior right upper lobe with invasion of the chest wall and right first and second ribs, consistent with primary bronchogenic carcinoma. 2. Ipsi- and contralateral nodal and pulmonary metastatic disease. Conglomerate mediastinal lymphadenopathy significantly narrows the SVC. 3. Severe T1 compression fracture, possibly pathologic. 4. UIP-type interstitial lung disease, progressed since 2014. 5. Aortic Atherosclerosis (ICD10-I70.0). Abdomen and pelvis: 1. Enlarging mass associated with the anterior cecum, currently measuring 3.9 x 3.7 cm, previously 2.9 x 3.1 cm. Surrounding inflammatory changes have resolved. Findings are concerning for primary colon cancer. Electronically Signed   By: Titus Dubin M.D.   On:  01/10/2020 15:45   DG Chest Port 1 View  Result Date: 01/10/2020 CLINICAL DATA:  Cough, sepsis EXAM: PORTABLE CHEST 1 VIEW COMPARISON:  Multiple prior studies most recent chest x-ray from 12/12/2019 FINDINGS: Findings in the RIGHT upper chest with increasing confluent density in this area measuring 8.7 x 5.7 cm. Nodule in the LEFT chest at 1.4 cm. Apical scarring. Diffuse increased interstitial markings superimposed on pulmonary emphysema. Signs of atherosclerosis. No evidence of pleural effusion. Cardiomediastinal contours are normal. There is however fullness of the RIGHT hilum Absence of rib shadows of anterior ribs. No additional bony abnormality on limited assessment.  IMPRESSION: 1. Suspected mass in the RIGHT upper lobe with anterior rib destruction and associated LEFT lower lobe nodule. CT of the chest is recommended for further evaluation. 2. Fullness of the RIGHT hilum raising the question of RIGHT hilar adenopathy. 3. Diffused increased interstitial markings could represent chronic lung disease, pneumonitis or lymphangitic tumor is also considered. These results were called by telephone at the time of interpretation on 01/10/2020 at 2:27 pm to provider [facility] , who verbally acknowledged these results. Electronically Signed   By: Zetta Bills M.D.   On: 01/10/2020 14:24        Scheduled Meds: . aspirin EC  325 mg Oral Daily  . ferrous sulfate  325 mg Oral Q breakfast  . gabapentin  600 mg Oral TID  . heparin  5,000 Units Subcutaneous Q8H  . insulin aspart  0-15 Units Subcutaneous TID WC  . linagliptin  5 mg Oral Daily  . metoprolol tartrate  12.5 mg Oral BID  . mometasone-formoterol  2 puff Inhalation BID  . pantoprazole  40 mg Oral Daily  . pravastatin  40 mg Oral q1800  . QUEtiapine  12.5 mg Oral QHS  . senna  1 tablet Oral BID   Continuous Infusions: . piperacillin-tazobactam (ZOSYN)  IV 3.375 g (01/11/20 0924)     LOS: 1 day    Time spent: 24min  Domenic Polite, MD [facility]  01/11/2020, 10:37 AM

## 2020-01-11 NOTE — Progress Notes (Signed)
Lab result received from [facility] ED blood culture came back with gram (-) rod from aerobic bottle, text page NP on call.

## 2020-01-11 NOTE — Consult Note (Signed)
Chief Complaint: Incidental finding of RUL mass. Request is for RUL lung biopsy   Referring Physician(s): Dr. Ernestene Kiel  Supervising Physician: Daryll Brod  Patient Status: [facility] - In-pt  History of Present Illness: [name] is a 79 y.o. female Smoker, History of DM, breast cancer (right) s/p mastectomy, PE (not on anticoagulation), recent history of cecal diverticulitis with perforation. Presented to the emergency room at [facility] with abdominal pain fever and right arm cellulitis and an incidental finding of a new RUL lung mass during work up.Team is requesting RUL lung biopsy for further determination.   Past Medical History:  Diagnosis Date  . Breast CA ([place]) 01/16/2011  . Breast cancer ([place])    rt dx 2010; mastectomy only  . Diverticulitis   . Embolism - blood clot    left lung  . History of prolapse of bladder    patient has gellhorn pessary  . Hypertension   . Infiltrating ductal carcinoma of breast ([place]) 01/16/2011   Started Arimidex on 02/24/2009. Stage IB (T1b N0 M0) grade 1 infiltrating ductal carcinoma of the right breast, status post right modified radical mastectomy on 01/22/2009 for a 10 mm cancer, ER +100%, PR +98%, KI-67 marker was 9%, HER-2 negative, no LVI identified, and 11 lymph nodes were negative, all margins were clear. She had some associated DCIS, is now on Arimidex and she will take that for  . Stroke ([place])   . Tendonitis 07/2010   shot of cortisone    Past Surgical History:  Procedure Laterality Date  . ABDOMINAL HYSTERECTOMY  1987  . APPENDECTOMY  1974   blood clot in lung  . BREAST BIOPSY  1968   left  . COLONOSCOPY N/A 01/11/2018   Procedure: COLONOSCOPY;  Surgeon: Danie Binder, MD;  Location: [facility] ENDO SUITE;  Service: Endoscopy;  Laterality: N/A;  10:00  . MASTECTOMY MODIFIED RADICAL  01/22/2009   rt  . NASAL SINUS SURGERY  2002  . POLYPECTOMY  01/11/2018   Procedure: POLYPECTOMY;  Surgeon: Danie Binder, MD;  Location: [facility] ENDO  SUITE;  Service: Endoscopy;;  ascending; descending;  . TEAR DUCT PROBING    . tubal ligation  1972  . YAG LASER APPLICATION Right 2/83/6629   Procedure: YAG LASER APPLICATION;  Surgeon: Williams Che, MD;  Location: [facility] ORS;  Service: Ophthalmology;  Laterality: Right;    Allergies: Pollen extract and Red dye  Medications: Prior to Admission medications   Medication Sig Start Date End Date Taking? Authorizing Provider  acetaminophen (TYLENOL) 650 MG CR tablet Take 650 mg by mouth as needed for pain.    [provider]  albuterol (PROVENTIL HFA;VENTOLIN HFA) 108 (90 Base) MCG/ACT inhaler Inhale 1 puff into the lungs every 6 (six) hours as needed for wheezing or shortness of breath.    [provider]  aspirin EC 325 MG tablet Take 325 mg by mouth daily. 11/03/19   [provider]  calcium-vitamin D (OSCAL WITH D) 500-200 MG-UNIT TABS tablet Take 1 tablet by mouth in the morning, at noon, and at bedtime. 11/03/19   [provider]  ferrous sulfate 325 (65 FE) MG tablet Take 325 mg by mouth daily with breakfast.    [provider]  fluticasone (FLONASE) 50 MCG/ACT nasal spray Place 2 sprays into both nostrils as needed.  01/12/16   [provider]  gabapentin (NEURONTIN) 600 MG tablet Take 1 tablet (600 mg total) by mouth 3 (three) times daily. 12/19/19  Orson Eva, MD  lovastatin (MEVACOR) 40 MG tablet Take 40 mg by mouth at bedtime.     [provider]  metoprolol tartrate (LOPRESSOR) 25 MG tablet Take 0.5 tablets (12.5 mg total) by mouth 2 (two) times daily. 12/18/19   Orson Eva, MD  metroNIDAZOLE (METROGEL) 0.75 % vaginal gel Place 1 Applicatorful vaginally once a week. one applicatorful to vagina at bedtime weekly , as needed for vaginal discharge with pessary 10/31/17   Jonnie Kind, MD  naphazoline-glycerin (CLEAR EYES) 0.012-0.2 % SOLN Place 2 drops into both eyes daily as needed for irritation.    [provider]   omeprazole (PRILOSEC) 20 MG capsule Take 1 capsule by mouth daily. 06/28/13   [provider]  QUEtiapine (SEROQUEL) 25 MG tablet Take 0.5 tablets (12.5 mg total) by mouth at bedtime. 12/17/19   Barton Dubois, MD  SYMBICORT 160-4.5 MCG/ACT inhaler Inhale 2 puffs into the lungs as needed.  06/23/19   [provider]  tiotropium (SPIRIVA) 18 MCG inhalation capsule Place 18 mcg into inhaler and inhale daily as needed.    [provider]  TRADJENTA 5 MG TABS tablet Take 5 mg by mouth daily.  02/12/16   [provider]     Family History  Problem Relation Age of Onset  . Breast cancer Sister   . Breast cancer Maternal Grandmother   . Colon cancer Daughter 83  . Hypertension Paternal Grandfather   . Stroke Paternal Grandfather   . Other Paternal Grandmother        hardening of arteries  . Other Father        gun shot wound  . Cancer Sister   . Cancer Daughter   . Stroke Son   . Heart attack Son   . Gastric cancer Neg Hx   . Esophageal cancer Neg Hx     Social History   Socioeconomic History  . Marital status: Divorced    Spouse name: Not on file  . Number of children: Not on file  . Years of education: Not on file  . Highest education level: Not on file  Occupational History  . Not on file  Tobacco Use  . Smoking status: Current Every Day Smoker    Packs/day: 0.50    Years: 62.00    Pack years: 31.00    Types: Cigarettes  . Smokeless tobacco: Never Used  Vaping Use  . Vaping Use: Never used  Substance and Sexual Activity  . Alcohol use: Yes    Comment: occ  . Drug use: No  . Sexual activity: Never    Birth control/protection: Surgical    Comment: hyst  Other Topics Concern  . Not on file  Social History Narrative  . Not on file   Social Determinants of Health   Financial Resource Strain:   . Difficulty of Paying Living Expenses:   Food Insecurity:   . Worried About Charity fundraiser in the Last Year:   . Arboriculturist  in the Last Year:   Transportation Needs:   . Film/video editor (Medical):   Marland Kitchen Lack of Transportation (Non-Medical):   Physical Activity:   . Days of Exercise per Week:   . Minutes of Exercise per Session:   Stress:   . Feeling of Stress :   Social Connections:   . Frequency of Communication with Friends and Family:   . Frequency of Social Gatherings with Friends and Family:   . Attends Religious Services:   .  Active Member of Clubs or Organizations:   . Attends Archivist Meetings:   Marland Kitchen Marital Status:     Review of Systems: A 12 point ROS discussed and pertinent positives are indicated in the HPI above.  All other systems are negative.  Review of Systems  Constitutional: Positive for fatigue. Negative for fever.  HENT: Negative for congestion.   Respiratory: Negative for cough and shortness of breath.   Gastrointestinal: Negative for abdominal pain, diarrhea, nausea and vomiting.  Psychiatric/Behavioral: Positive for confusion ( patient verbally states that she is confused. ).    Vital Signs: BP (!) 151/66 (BP Location: Left Leg)   Pulse 85   Temp 98.2 F (36.8 C) (Oral)   Resp 17   Ht 5' 5"  (1.651 m)   Wt 138 lb 14.2 oz (63 kg)   SpO2 96%   BMI 23.11 kg/m   Physical Exam Cardiovascular:     Rate and Rhythm: Normal rate and regular rhythm.     Heart sounds: Normal heart sounds.  Pulmonary:     Effort: Pulmonary effort is normal.     Comments: diminished bilaterally Musculoskeletal:        General: Swelling ( right arm. ) present.  Psychiatric:     Comments: Sleepy affect.     Imaging: CT Chest W Contrast  Result Date: 01/10/2020 CLINICAL DATA:  Fever, hypotension, and abdominal pain. EXAM: CT CHEST, ABDOMEN, AND PELVIS WITH CONTRAST TECHNIQUE: Multidetector CT imaging of the chest, abdomen and pelvis was performed following the standard protocol during bolus administration of intravenous contrast. CONTRAST:  14m OMNIPAQUE IOHEXOL 300 MG/ML   SOLN COMPARISON:  Chest x-ray from same day. CT abdomen pelvis dated Dec 15, 2019. CT chest dated August 26, 2012. FINDINGS: CT CHEST FINDINGS Cardiovascular: Normal heart size. No pericardial effusion. No thoracic aortic aneurysm or dissection. Coronary, aortic arch, and branch vessel atherosclerotic vascular disease. No central pulmonary embolism. Mediastinum/Nodes: Conglomerate mediastinal lymphadenopathy measures 6.2 x 3.4 cm and invades and severely narrows the SVC. Enlarged high right paratracheal lymph node measuring 1.9 cm in short axis. Enlarged bilateral hilar lymph nodes measuring up to 1.8 cm in short axis. No enlarged axillary lymph nodes. Prominent 8 mm right internal mamillary lymph node (series 2, image 18). Prior right axillary lymph node dissection. Subcentimeter hypodense nodules in both thyroid lobes. Not clinically significant; no follow-up imaging recommended. The trachea and esophagus demonstrate no significant findings. Lungs/Pleura: Large 5.7 x 5.6 cm mass in the anterior right upper lobe with invasion of the chest wall and right first and second ribs. Focal narrowing of the right subclavian vein just superior to the mass. Small satellite nodules in the right upper lobe measuring up the mm (series 3, image 54). Multiple nodules in the left upper lobe measuring up to 2.2 cm (series 3, image 68). 9 mm nodule in the left lower lobe (series 3, image 100). Progressive basilar predominant peripheral subpleural reticulation and honeycombing throughout both lungs. No focal consolidation, pleural effusion, or pneumothorax. Musculoskeletal: Direct invasion of the right first and second ribs as above. Severe T1 compression fracture. CT ABDOMEN PELVIS FINDINGS Hepatobiliary: No focal liver abnormality is seen. No gallstones, gallbladder wall thickening, or biliary dilatation. Pancreas: Unremarkable. No pancreatic ductal dilatation or surrounding inflammatory changes. Spleen: Normal in size without  focal abnormality. Adrenals/Urinary Tract: The adrenal glands are unremarkable. Unchanged small bilateral renal cysts. No renal calculi or hydronephrosis. Mild circumferential bladder wall thickening. Stomach/Bowel: Progressive circumferential wall thickening cecum with enlarging mass anteriorly,  currently measuring 3.9 x 3.7 cm, previously 2.9 x 3.1 cm. Surrounding inflammatory changes have resolved. Diffuse colonic diverticulosis. The stomach and small bowel are unremarkable. Vascular/Lymphatic: Aortic atherosclerosis. No enlarged abdominal or pelvic lymph nodes. Reproductive: Status post hysterectomy. No adnexal masses. Other: No free fluid or pneumoperitoneum. Unchanged small area of fat necrosis near the upper pole the right kidney. Musculoskeletal: No acute or significant osseous findings. IMPRESSION: Chest: 1. Large 5.7 x 5.6 cm mass in the anterior right upper lobe with invasion of the chest wall and right first and second ribs, consistent with primary bronchogenic carcinoma. 2. Ipsi- and contralateral nodal and pulmonary metastatic disease. Conglomerate mediastinal lymphadenopathy significantly narrows the SVC. 3. Severe T1 compression fracture, possibly pathologic. 4. UIP-type interstitial lung disease, progressed since 2014. 5. Aortic Atherosclerosis (ICD10-I70.0). Abdomen and pelvis: 1. Enlarging mass associated with the anterior cecum, currently measuring 3.9 x 3.7 cm, previously 2.9 x 3.1 cm. Surrounding inflammatory changes have resolved. Findings are concerning for primary colon cancer. Electronically Signed   By: Titus Dubin M.D.   On: 01/10/2020 15:45   CT ABDOMEN PELVIS W CONTRAST  Result Date: 01/10/2020 CLINICAL DATA:  Fever, hypotension, and abdominal pain. EXAM: CT CHEST, ABDOMEN, AND PELVIS WITH CONTRAST TECHNIQUE: Multidetector CT imaging of the chest, abdomen and pelvis was performed following the standard protocol during bolus administration of intravenous contrast. CONTRAST:   166m OMNIPAQUE IOHEXOL 300 MG/ML  SOLN COMPARISON:  Chest x-ray from same day. CT abdomen pelvis dated Dec 15, 2019. CT chest dated August 26, 2012. FINDINGS: CT CHEST FINDINGS Cardiovascular: Normal heart size. No pericardial effusion. No thoracic aortic aneurysm or dissection. Coronary, aortic arch, and branch vessel atherosclerotic vascular disease. No central pulmonary embolism. Mediastinum/Nodes: Conglomerate mediastinal lymphadenopathy measures 6.2 x 3.4 cm and invades and severely narrows the SVC. Enlarged high right paratracheal lymph node measuring 1.9 cm in short axis. Enlarged bilateral hilar lymph nodes measuring up to 1.8 cm in short axis. No enlarged axillary lymph nodes. Prominent 8 mm right internal mamillary lymph node (series 2, image 18). Prior right axillary lymph node dissection. Subcentimeter hypodense nodules in both thyroid lobes. Not clinically significant; no follow-up imaging recommended. The trachea and esophagus demonstrate no significant findings. Lungs/Pleura: Large 5.7 x 5.6 cm mass in the anterior right upper lobe with invasion of the chest wall and right first and second ribs. Focal narrowing of the right subclavian vein just superior to the mass. Small satellite nodules in the right upper lobe measuring up the mm (series 3, image 54). Multiple nodules in the left upper lobe measuring up to 2.2 cm (series 3, image 68). 9 mm nodule in the left lower lobe (series 3, image 100). Progressive basilar predominant peripheral subpleural reticulation and honeycombing throughout both lungs. No focal consolidation, pleural effusion, or pneumothorax. Musculoskeletal: Direct invasion of the right first and second ribs as above. Severe T1 compression fracture. CT ABDOMEN PELVIS FINDINGS Hepatobiliary: No focal liver abnormality is seen. No gallstones, gallbladder wall thickening, or biliary dilatation. Pancreas: Unremarkable. No pancreatic ductal dilatation or surrounding inflammatory changes.  Spleen: Normal in size without focal abnormality. Adrenals/Urinary Tract: The adrenal glands are unremarkable. Unchanged small bilateral renal cysts. No renal calculi or hydronephrosis. Mild circumferential bladder wall thickening. Stomach/Bowel: Progressive circumferential wall thickening cecum with enlarging mass anteriorly, currently measuring 3.9 x 3.7 cm, previously 2.9 x 3.1 cm. Surrounding inflammatory changes have resolved. Diffuse colonic diverticulosis. The stomach and small bowel are unremarkable. Vascular/Lymphatic: Aortic atherosclerosis. No enlarged abdominal or pelvic lymph nodes. Reproductive:  Status post hysterectomy. No adnexal masses. Other: No free fluid or pneumoperitoneum. Unchanged small area of fat necrosis near the upper pole the right kidney. Musculoskeletal: No acute or significant osseous findings. IMPRESSION: Chest: 1. Large 5.7 x 5.6 cm mass in the anterior right upper lobe with invasion of the chest wall and right first and second ribs, consistent with primary bronchogenic carcinoma. 2. Ipsi- and contralateral nodal and pulmonary metastatic disease. Conglomerate mediastinal lymphadenopathy significantly narrows the SVC. 3. Severe T1 compression fracture, possibly pathologic. 4. UIP-type interstitial lung disease, progressed since 2014. 5. Aortic Atherosclerosis (ICD10-I70.0). Abdomen and pelvis: 1. Enlarging mass associated with the anterior cecum, currently measuring 3.9 x 3.7 cm, previously 2.9 x 3.1 cm. Surrounding inflammatory changes have resolved. Findings are concerning for primary colon cancer. Electronically Signed   By: Titus Dubin M.D.   On: 01/10/2020 15:45   CT ABDOMEN PELVIS W CONTRAST  Result Date: 12/15/2019 CLINICAL DATA:  Follow-up for diverticulitis. EXAM: CT ABDOMEN AND PELVIS WITH CONTRAST TECHNIQUE: Multidetector CT imaging of the abdomen and pelvis was performed using the standard protocol following bolus administration of intravenous contrast. CONTRAST:   55m OMNIPAQUE IOHEXOL 300 MG/ML  SOLN COMPARISON:  12/12/2019 FINDINGS: Lower chest: Lung base fibrosis, stable.  No acute findings. Hepatobiliary: No focal liver abnormality is seen. No gallstones, gallbladder wall thickening, or biliary dilatation. Pancreas: Unremarkable. No pancreatic ductal dilatation or surrounding inflammatory changes. Spleen: Normal in size without focal abnormality. Adrenals/Urinary Tract: No adrenal mass. Kidneys normal in size, orientation and position with symmetric enhancement and excretion. 1.8 cm upper pole left renal cyst. 7 mm anterior midpole right renal cyst. No other masses, no stones and no hydronephrosis. Normal ureters. Normal bladder. Fat containing area in the perinephric fat adjacent to the superolateral right kidney, likely an area of fat necrosis, unchanged. Stomach/Bowel: Poorly defined masslike abnormality contiguous with the anterior wall of the cecum, consistent with a poorly defined abscess or phlegmon, currently measuring 3.1 x 2.9 x 3.7 cm, increased from 2.6 x 2.3 x 3.2 cm on the prior exam. There is thickening of the wall of the cecum which is similar to the prior study. The right transverse colon abuts the area of inflammation arising from the anterior cecum. There are numerous colonic diverticula which predominate on the left. No other evidence of diverticulitis. No other areas of wall thickening or inflammation. Stomach and small bowel are unremarkable. Vascular/Lymphatic: Aortic atherosclerosis. No enlarged lymph nodes. Reproductive: Status post hysterectomy. No adnexal masses. Other: No ascites. There is subcutaneous soft tissue air the lower, new from the prior exam. Anterior abdominal wall Musculoskeletal: No acute fracture. No osteoblastic or osteolytic lesions. IMPRESSION: 1. The poorly defined collection or phlegmon, contiguous with the anterior wall of the cecum, has mildly increased in size, but remains poorly defined, and may not be drainable. There  are surrounding inflammatory changes and more diffuse wall thickening of the cecum, which is similar to the prior study. 2. No other acute abnormality within the abdomen or pelvis. No free air. No ascites. Electronically Signed   By: DLajean ManesM.D.   On: 12/15/2019 13:27   CT ABDOMEN PELVIS W CONTRAST  Result Date: 12/12/2019 CLINICAL DATA:  Lower abdominal pain since last night, multiple bouts of diarrhea, history of breast cancer, diverticulitis, appendectomy, hysterectomy EXAM: CT ABDOMEN AND PELVIS WITH CONTRAST TECHNIQUE: Multidetector CT imaging of the abdomen and pelvis was performed using the standard protocol following bolus administration of intravenous contrast. CONTRAST:  1064mOMNIPAQUE IOHEXOL 300 MG/ML  SOLN COMPARISON:  CT abdomen pelvis 04/29/2017 FINDINGS: Lower chest: Extensive subpleural reticular changes throughout the lung bases likely reflecting some chronic interstitial lung disease, previously characterized as likely UIP. Overall extent of disease in the visible bases is similar to minimally increased. Normal heart size. No pericardial effusion. Coronary atherosclerosis. Mitral annular calcification. Hepatobiliary: No focal liver abnormality is seen. No gallstones, gallbladder wall thickening, or biliary dilatation. Pancreas: Unremarkable. No pancreatic ductal dilatation or surrounding inflammatory changes. Spleen: Normal in size without focal abnormality. Adrenals/Urinary Tract: Normal adrenal glands continued stability of a retroperitoneal lesion adjacent the right renal fossa. Kidneys enhance and excrete symmetrically. Few simple appearing renal cysts. No concerning renal lesions. No urolithiasis or hydronephrosis. Normal bladder. Suspect pelvic floor laxity with cystocele. Stomach/Bowel: Small sliding-type hiatal hernia. Esophagus, stomach and duodenum are otherwise unremarkable. Mild thickening at the terminal ileum with more pronounced circumferential thickening and phlegmonous  change centered upon the cecum as well as a an outpouching along the anterior margin which appears to reflect a contained volume of air and fluid with extensive surrounding inflammatory phlegmonous change as well as some associated reactive thickening of the slightly redundant proximal transverse colon which courses in the immediate vicinity. This may arise from right-sided diverticula seen in this vicinity on comparison CT more distal colonic segments are free of thickening or dilatation. Scattered colonic diverticula throughout much of the distal colon are unremarkable. Vascular/Lymphatic: Atherosclerotic plaque within aorta and branch vessels. Focal fusiform ectasia of the infrarenal abdominal aorta up to 2 cm. No other aneurysm or ectasia. Reactive adenopathy in the abdomen. No pathologically enlarged abdominopelvic nodes. Reproductive: Uterus is surgically absent. No concerning adnexal lesions. Other: Contained air and fluid about the cecum as above with surrounding phlegmon. No free air or fluid in the abdomen or pelvis. No bowel containing hernias. Musculoskeletal: Stable superior endplate deformity Z22 with superimposed Schmorl's node. Stepwise retrolisthesis L2-L5 is unchanged from prior. Multilevel degenerative changes are present in the imaged portions of the spine. Additional degenerative changes in the SI joints and hips. No acute osseous abnormality or suspicious osseous lesion. IMPRESSION: 1. Contained volume of air and fluid about the cecum with extensive surrounding inflammatory phlegmonous change as well as some associated reactive thickening of the slightly redundant proximal transverse colon which courses in the immediate vicinity. Findings are favored to represent a perforated diverticulitis with contained collection/abscess. 2. Extensive subpleural reticular changes throughout the lung bases likely reflecting some chronic interstitial lung disease, previously characterized as likely UIP.  Overall extent of disease in the visible bases is similar to minimally increased. 3. Small sliding-type hiatal hernia. 4. Suspect pelvic floor laxity with cystocele. 5. Stable remote compression deformity T11 6. Aortic Atherosclerosis (ICD10-I70.0). These results were called by telephone at the time of interpretation on 12/12/2019 at 3:29 pm to provider JOSHUA LONG , who verbally acknowledged these results. Electronically Signed   By: Lovena Le M.D.   On: 12/12/2019 15:29   DG Chest Port 1 View  Result Date: 01/10/2020 CLINICAL DATA:  Cough, sepsis EXAM: PORTABLE CHEST 1 VIEW COMPARISON:  Multiple prior studies most recent chest x-ray from 12/12/2019 FINDINGS: Findings in the RIGHT upper chest with increasing confluent density in this area measuring 8.7 x 5.7 cm. Nodule in the LEFT chest at 1.4 cm. Apical scarring. Diffuse increased interstitial markings superimposed on pulmonary emphysema. Signs of atherosclerosis. No evidence of pleural effusion. Cardiomediastinal contours are normal. There is however fullness of the RIGHT hilum Absence of rib shadows of anterior ribs. No additional  bony abnormality on limited assessment. IMPRESSION: 1. Suspected mass in the RIGHT upper lobe with anterior rib destruction and associated LEFT lower lobe nodule. CT of the chest is recommended for further evaluation. 2. Fullness of the RIGHT hilum raising the question of RIGHT hilar adenopathy. 3. Diffused increased interstitial markings could represent chronic lung disease, pneumonitis or lymphangitic tumor is also considered. These results were called by telephone at the time of interpretation on 01/10/2020 at 2:27 pm to provider [facility] , who verbally acknowledged these results. Electronically Signed   By: Zetta Bills M.D.   On: 01/10/2020 14:24   VAS [place] UPPER EXTREMITY VENOUS DUPLEX  Result Date: 01/11/2020 UPPER VENOUS STUDY  Indications: Pain, Swelling, and Erythema Other Indications: Cellulitis with sepsis.  Comparison Study: Prior study from 09/06/2017 is available for comparison Performing Technologist: Sharion Dove RVS  Examination Guidelines: A complete evaluation includes B-mode imaging, spectral Doppler, color Doppler, and power Doppler as needed of all accessible portions of each vessel. Bilateral testing is considered an integral part of a complete examination. Limited examinations for reoccurring indications may be performed as noted.  Right Findings: +----------+------------+---------+-----------+----------+-------+ RIGHT     CompressiblePhasicitySpontaneousPropertiesSummary +----------+------------+---------+-----------+----------+-------+ IJV           Full       Yes       Yes                      +----------+------------+---------+-----------+----------+-------+ Subclavian    Full       Yes       Yes                      +----------+------------+---------+-----------+----------+-------+ Axillary      Full       Yes       Yes                      +----------+------------+---------+-----------+----------+-------+ Brachial      Full       Yes       Yes                      +----------+------------+---------+-----------+----------+-------+ Radial        Full                                          +----------+------------+---------+-----------+----------+-------+ Ulnar         Full                                          +----------+------------+---------+-----------+----------+-------+ Cephalic      Full                                          +----------+------------+---------+-----------+----------+-------+ Basilic       Full                                          +----------+------------+---------+-----------+----------+-------+  Left Findings: +----------+------------+---------+-----------+----------+-------+ LEFT      CompressiblePhasicitySpontaneousPropertiesSummary  +----------+------------+---------+-----------+----------+-------+ Subclavian    Full  Yes       Yes                      +----------+------------+---------+-----------+----------+-------+  Summary:  Right: No evidence of deep vein thrombosis in the upper extremity. No evidence of superficial vein thrombosis in the upper extremity.  Left: No evidence of thrombosis in the subclavian.  *See table(s) above for measurements and observations.  Diagnosing physician: Monica Martinez MD Electronically signed by Monica Martinez MD on 01/11/2020 at 11:15:47 AM.    Final     Labs:  CBC: Recent Labs    12/14/19 0640 12/15/19 0606 01/08/20 1357 01/10/20 1315  WBC 12.9* 8.4 8.7 19.0*  HGB 11.9* 12.6 12.0 12.2  HCT 37.6 39.8 38.3 38.1  PLT 148* 155 210 177    COAGS: Recent Labs    01/10/20 1315  INR 1.0  APTT 31    BMP: Recent Labs    12/14/19 0640 12/19/19 0501 01/08/20 1357 01/10/20 1315  NA 133* 136 131* 132*  K 4.0 3.5 4.6 4.1  CL 104 106 97* 97*  CO2 23 20* 23 25  GLUCOSE 133* 107* 110* 156*  BUN 9 5* 9 10  CALCIUM 7.6* 8.1* 9.8 9.1  CREATININE 0.55 0.32* 0.76 0.84  GFRNONAA >60 >60 >60 >60  GFRAA >60 >60 >60 >60    LIVER FUNCTION TESTS: Recent Labs    09/04/19 1120 12/12/19 1249 01/08/20 1357 01/10/20 1315  BILITOT 0.6 0.8 0.3 0.9  AST 15 17 18  13*  ALT 11 13 10 9   ALKPHOS 57 55 41 43  PROT 6.9 8.1 6.8 7.0  ALBUMIN 3.5 4.2 3.5 3.4*   Assessment and Plan: 79 y.o, female inpatient. Smoker, History of DM, breast cancer (right) s/p mastectomy, PE (not on anticoagulation), recent history of cecal diverticulitis with perforation. Presented to the emergency room at [facility] with abdominal pain fever and right arm cellulitis and an incidental finding of a new RUL lung mass during work up.Team is requesting RUL lung biopsy for further determination.  Pertinent Imaging 6.12.21 - CT chest reads Large 5.7 x 5.6 cm mass in the anterior right upper lobe  with invasion of the chest wall and right first and second ribs, consistent with primary bronchogenic carcinoma.  Pertinent IR History none  Pertinent Allergies none  COVID - on 6.12.21,  WBC is 19.0. BC pending. Lactic acid is 1.6 All labs are within acceptable parameters. Patient is on subcutaneous prophylactic dose of heparin currently on hold. Patient is afebrile.  Risks and benefits of RUL biopsy was discussed with the patient and/or patient's family including, but not limited to bleeding, infection, damage to adjacent structures or low yield requiring additional tests.  All of the questions were answered and there is agreement to proceed.  Consent was obtained via telephone from grandson and is signed and in chart.Bedside RN witnessed.    Thank you for this interesting consult.  I greatly enjoyed meeting [name] and look forward to participating in their care.  A copy of this report was sent to the requesting provider on this date.  Electronically Signed: Avel Peace, NP 01/11/2020, 1:44 PM   I spent a total of 40 Minutes    in face to face in clinical consultation, greater than 50% of which was counseling/coordinating care for RUL lung biopsy.

## 2020-01-11 NOTE — Consult Note (Signed)
NAME:  [name], MRN:  [id number], DOB:  October 02, 1940, LOS: 1 ADMISSION DATE:  01/10/2020, CONSULTATION DATE: June 13 REFERRING MD: Dr. Broadus John, reason for consult: Lung mass, abnormal CT chest  Brief History   79 year old female admitted on June 12 in the setting of right upper extremity swelling, CT scan worrisome for SVC syndrome with newly discovered right upper lobe mass  History of present illness   79 year old female admitted  January 10, 2012 in the setting of right upper arm swelling and a newly discovered right upper lobe mass.  Findings were worrisome for SVC syndrome so pulmonary critical care medicine was consulted.  The patient has smoked at least 1/2 pack of cigarettes daily and continues to smoke up until recently.  Of note, she had diverticulitis with perforation recently and was discharged to a SNF, then home.  She tells me that she came to the hospital because she had abdominal pain secondary to "diverticulitis", however the medical record states that she was sent to the emergency room by her family physician who noticed increasing swelling and redness in the right arm.  She says that she does have a history of breast cancer and was treated with radical mastectomy in June 2010 and completed anastrozole in June 2015.  She denies shortness of breath or cough.  Says that she has never used oxygen before.  She says that "if I have been short of breath I have just gotten used to it".  She denies ever taking Macrobid in the past and she denies a history of autoimmune disease.  Past Medical History  Breast cancer Diverticulitis Pulmonary embolism Hypertension Breast cancer Tendonitis  Significant Hospital Events     Consults:  PCCM IR  Procedures:    Significant Diagnostic Tests:  6/12 CT chest and abdomen/pelvis: images independently reviewed showing a large right upper lobe mass which is infiltrated through the right chest wall just under the skin as well as paratracheal  adenopathy, honeycombing and interlobular septal thickening and a basilar predominant distribution consistent with UIP, also cecal mass noted  Micro Data:  June 12 urine culture June 12 blood culture  Antimicrobials:  June 12 Zosyn June 12 vancomycin  Interim history/subjective:    Objective   Blood pressure (!) 145/69, pulse 88, temperature 98.2 F (36.8 C), temperature source Oral, resp. rate 18, height 5\' 5"  (1.651 m), weight 63 kg, SpO2 99 %.        Intake/Output Summary (Last 24 hours) at 01/11/2020 1354 Last data filed at 01/11/2020 0300 Gross per 24 hour  Intake 773.59 ml  Output --  Net 773.59 ml   Filed Weights   01/10/20 1303  Weight: 63 kg    Examination:  General:  Resting comfortably in bed HENT: NCAT OP clear PULM: CTA B, normal effort CV: RRR, no mgr GI: BS+, soft, nontender MSK: normal bulk and tone, chest wall mass palpable Derm: right arm swelling and redness Neuro: awake, alert, no distress, MAEW   Resolved Hospital Problem list    Assessment & Plan:  Chest wall mass causing SVC syndrome: Multiple possible etiologies here including bronchogenic carcinoma, recurrent breast cancer or colon cancer related IR consult for CT-guided biopsy Would transfer to [place] for radiation therapy after biopsy  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 but would need complete work-up as an outpatient Suspect she will need oxygen at discharge Will need pulmonary follow-up with PFT, high-res CT and full work-up  Cecal mass: Consider GI consult  Best  practice:   Per [facility]  Labs   CBC: Recent Labs  Lab 01/08/20 1357 01/10/20 1315  WBC 8.7 19.0*  NEUTROABS 6.6 17.4*  HGB 12.0 12.2  HCT 38.3 38.1  MCV 95.5 93.8  PLT 210 427    Basic Metabolic Panel: Recent Labs  Lab 01/08/20 1357 01/10/20 1315  NA 131* 132*  K 4.6 4.1  CL 97* 97*  CO2 23 25  GLUCOSE 110* 156*  BUN 9 10  CREATININE 0.76 0.84  CALCIUM 9.8 9.1   GFR: Estimated Creatinine  Clearance: 48.1 mL/min (by C-G formula based on SCr of 0.84 mg/dL). Recent Labs  Lab 01/08/20 1357 01/10/20 1315  WBC 8.7 19.0*  LATICACIDVEN  --  1.6    Liver Function Tests: Recent Labs  Lab 01/08/20 1357 01/10/20 1315  AST 18 13*  ALT 10 9  ALKPHOS 41 43  BILITOT 0.3 0.9  PROT 6.8 7.0  ALBUMIN 3.5 3.4*   No results for input(s): LIPASE, AMYLASE in the last 168 hours. No results for input(s): AMMONIA in the last 168 hours.  ABG    Component Value Date/Time   PHART 7.475 (H) 04/04/2011 0940   PCO2ART 34.4 (L) 04/04/2011 0940   PO2ART 87.3 04/04/2011 0940   HCO3 25.0 (H) 04/04/2011 0940   TCO2 25 03/22/2016 1235   O2SAT 97.5 04/04/2011 0940     Coagulation Profile: Recent Labs  Lab 01/10/20 1315  INR 1.0    Cardiac Enzymes: No results for input(s): CKTOTAL, CKMB, CKMBINDEX, TROPONINI in the last 168 hours.  HbA1C: Hgb A1c MFr Bld  Date/Time Value Ref Range Status  12/12/2019 12:49 PM 5.9 (H) 4.8 - 5.6 % Final    Comment:    (NOTE) Pre diabetes:          5.7%-6.4% Diabetes:              >6.4% Glycemic control for   <7.0% adults with diabetes   03/22/2016 12:27 PM 5.8 (H) 4.8 - 5.6 % Final    Comment:    (NOTE)         Pre-diabetes: 5.7 - 6.4         Diabetes: >6.4         Glycemic control for adults with diabetes: <7.0     CBG: Recent Labs  Lab 01/10/20 1710 01/10/20 2134 01/11/20 0753 01/11/20 1248  GLUCAP 124* 103* 117* 149*    Review of Systems:   Gen:per HPI HEENT: Denies blurred vision, double vision, hearing loss, tinnitus, sinus congestion, rhinorrhea, sore throat, neck stiffness, dysphagia PULM: per HPI CV: Denies chest pain, edema, orthopnea, paroxysmal nocturnal dyspnea, palpitations GI: Denies abdominal pain, nausea, vomiting, diarrhea, hematochezia, melena, constipation, change in bowel habits GU: Denies dysuria, hematuria, polyuria, oliguria, urethral discharge Endocrine: Denies hot or cold intolerance, polyuria, polyphagia  or appetite change Derm: Denies rash, dry skin, scaling or peeling skin change Heme: Denies easy bruising, bleeding, bleeding gums Neuro: Denies headache, numbness, weakness, slurred speech, loss of memory or consciousness   Past Medical History  She,  has a past medical history of Breast CA ([place]) (01/16/2011), Breast cancer ([place]), Diverticulitis, Embolism - blood clot, History of prolapse of bladder, Hypertension, Infiltrating ductal carcinoma of breast ([place]) (01/16/2011), Stroke ([place]), and Tendonitis (07/2010).   Surgical History    Past Surgical History:  Procedure Laterality Date  . ABDOMINAL HYSTERECTOMY  1987  . APPENDECTOMY  1974   blood clot in lung  . BREAST BIOPSY  1968   left  .  COLONOSCOPY N/A 01/11/2018   Procedure: COLONOSCOPY;  Surgeon: Danie Binder, MD;  Location: [facility];  Service: Endoscopy;  Laterality: N/A;  10:00  . MASTECTOMY MODIFIED RADICAL  01/22/2009   rt  . NASAL SINUS SURGERY  2002  . POLYPECTOMY  01/11/2018   Procedure: POLYPECTOMY;  Surgeon: Danie Binder, MD;  Location: [facility];  Service: Endoscopy;;  ascending; descending;  . TEAR DUCT PROBING    . tubal ligation  1972  . YAG LASER APPLICATION Right 9/51/8841   Procedure: YAG LASER APPLICATION;  Surgeon: Williams Che, MD;  Location: [facility] ORS;  Service: Ophthalmology;  Laterality: Right;     Social History   reports that she has been smoking cigarettes. She has a 31.00 pack-year smoking history. She has never used smokeless tobacco. She reports current alcohol use. She reports that she does not use drugs.   Family History   Her family history includes Breast cancer in her maternal grandmother and sister; Cancer in her daughter and sister; Colon cancer (age of onset: 25) in her daughter; Heart attack in her son; Hypertension in her paternal grandfather; Other in her father and paternal grandmother; Stroke in her paternal grandfather and son. There is no history of Gastric cancer or  Esophageal cancer.   Allergies Allergies  Allergen Reactions  . Pollen Extract Other (See Comments)    Runny nose, watery eyes and ears get stopped up  . Red Dye      Home Medications  Prior to Admission medications   Medication Sig Start Date End Date Taking? Authorizing Provider  acetaminophen (TYLENOL) 650 MG CR tablet Take 650 mg by mouth as needed for pain.    [provider]  albuterol (PROVENTIL HFA;VENTOLIN HFA) 108 (90 Base) MCG/ACT inhaler Inhale 1 puff into the lungs every 6 (six) hours as needed for wheezing or shortness of breath.    [provider]  aspirin EC 325 MG tablet Take 325 mg by mouth daily. 11/03/19   [provider]  calcium-vitamin D (OSCAL WITH D) 500-200 MG-UNIT TABS tablet Take 1 tablet by mouth in the morning, at noon, and at bedtime. 11/03/19   [provider]  ferrous sulfate 325 (65 FE) MG tablet Take 325 mg by mouth daily with breakfast.    [provider]  fluticasone (FLONASE) 50 MCG/ACT nasal spray Place 2 sprays into both nostrils as needed.  01/12/16   [provider]  gabapentin (NEURONTIN) 600 MG tablet Take 1 tablet (600 mg total) by mouth 3 (three) times daily. 12/19/19   Orson Eva, MD  lovastatin (MEVACOR) 40 MG tablet Take 40 mg by mouth at bedtime.     [provider]  metoprolol tartrate (LOPRESSOR) 25 MG tablet Take 0.5 tablets (12.5 mg total) by mouth 2 (two) times daily. 12/18/19   Orson Eva, MD  metroNIDAZOLE (METROGEL) 0.75 % vaginal gel Place 1 Applicatorful vaginally once a week. one applicatorful to vagina at bedtime weekly , as needed for vaginal discharge with pessary 10/31/17   Jonnie Kind, MD  naphazoline-glycerin (CLEAR EYES) 0.012-0.2 % SOLN Place 2 drops into both eyes daily as needed for irritation.    [provider]  omeprazole (PRILOSEC) 20 MG capsule Take 1 capsule by mouth daily. 06/28/13   [provider]  QUEtiapine (SEROQUEL) 25 MG tablet Take  0.5 tablets (12.5 mg total) by mouth at bedtime. 12/17/19   Barton Dubois, MD  SYMBICORT 160-4.5 MCG/ACT inhaler Inhale 2 puffs into the  lungs as needed.  06/23/19   [provider]  tiotropium (SPIRIVA) 18 MCG inhalation capsule Place 18 mcg into inhaler and inhale daily as needed.    [provider]  TRADJENTA 5 MG TABS tablet Take 5 mg by mouth daily.  02/12/16   [provider]     Critical care time: n/a    Roselie Awkward, MD [place] [facility] Pager: [phone] Cell: [phone] If no response, call [phone]

## 2020-01-11 NOTE — Progress Notes (Signed)
PHARMACY - PHYSICIAN COMMUNICATION CRITICAL VALUE ALERT - BLOOD CULTURE IDENTIFICATION (BCID)  [name] is an 80 y.o. female with gram neg rods in blood cx - bcid neg  Name of physician (or Provider) Contacted: Dr Broadus John  Current antibiotics: Zosyn  Results for orders placed or performed during the hospital encounter of 01/10/20  Blood Culture ID Panel (Reflexed) (Collected: 01/10/2020  1:15 PM)  Result Value Ref Range   Enterococcus species NOT DETECTED NOT DETECTED   Listeria monocytogenes NOT DETECTED NOT DETECTED   Staphylococcus species NOT DETECTED NOT DETECTED   Staphylococcus aureus (BCID) NOT DETECTED NOT DETECTED   Streptococcus species NOT DETECTED NOT DETECTED   Streptococcus agalactiae NOT DETECTED NOT DETECTED   Streptococcus pneumoniae NOT DETECTED NOT DETECTED   Streptococcus pyogenes NOT DETECTED NOT DETECTED   Acinetobacter baumannii NOT DETECTED NOT DETECTED   Enterobacteriaceae species NOT DETECTED NOT DETECTED   Enterobacter cloacae complex NOT DETECTED NOT DETECTED   Escherichia coli NOT DETECTED NOT DETECTED   Klebsiella oxytoca NOT DETECTED NOT DETECTED   Klebsiella pneumoniae NOT DETECTED NOT DETECTED   Proteus species NOT DETECTED NOT DETECTED   Serratia marcescens NOT DETECTED NOT DETECTED   Haemophilus influenzae NOT DETECTED NOT DETECTED   Neisseria meningitidis NOT DETECTED NOT DETECTED   Pseudomonas aeruginosa NOT DETECTED NOT DETECTED   Candida albicans NOT DETECTED NOT DETECTED   Candida glabrata NOT DETECTED NOT DETECTED   Candida krusei NOT DETECTED NOT DETECTED   Candida parapsilosis NOT DETECTED NOT DETECTED   Candida tropicalis NOT DETECTED NOT DETECTED   Plan: Continue zosyn F/u cx results  Barth Kirks, PharmD, BCPS, BCCCP Clinical Pharmacist [phone]  Please check AMION for all [place] numbers  01/11/2020 10:30 AM

## 2020-01-12 ENCOUNTER — Other Ambulatory Visit: Payer: Self-pay

## 2020-01-12 ENCOUNTER — Ambulatory Visit: Payer: Medicare HMO | Admitting: Radiation Oncology

## 2020-01-12 ENCOUNTER — Inpatient Hospital Stay (HOSPITAL_COMMUNITY): Payer: Medicare HMO

## 2020-01-12 ENCOUNTER — Ambulatory Visit
Admit: 2020-01-12 | Discharge: 2020-01-12 | Disposition: A | Discharge status: Home / Self Care | Payer: Medicare HMO | Attending: Radiation Oncology | Admitting: Radiation Oncology

## 2020-01-12 ENCOUNTER — Ambulatory Visit
Admit: 2020-01-12 | Discharge: 2020-01-12 | Disposition: A | Discharge status: Home / Self Care | Payer: Medicare HMO | Source: Ambulatory Visit | Attending: Radiation Oncology | Admitting: Radiation Oncology

## 2020-01-12 DIAGNOSIS — R918 Other nonspecific abnormal finding of lung field: Secondary | ICD-10-CM

## 2020-01-12 LAB — GLUCOSE, CAPILLARY
Glucose-Capillary: 118 mg/dL — ABNORMAL HIGH (ref 70–99)
Glucose-Capillary: 105 mg/dL — ABNORMAL HIGH (ref 70–99)
Glucose-Capillary: 206 mg/dL — ABNORMAL HIGH (ref 70–99)
Glucose-Capillary: 237 mg/dL — ABNORMAL HIGH (ref 70–99)
Glucose-Capillary: 163 mg/dL — ABNORMAL HIGH (ref 70–99)

## 2020-01-12 LAB — COMPREHENSIVE METABOLIC PANEL
ALT: 8 U/L (ref 0–44)
AST: 12 U/L — ABNORMAL LOW (ref 15–41)
Albumin: 2.3 g/dL — ABNORMAL LOW (ref 3.5–5.0)
Alkaline Phosphatase: 32 U/L — ABNORMAL LOW (ref 38–126)
Anion gap: 8 (ref 5–15)
BUN: 5 mg/dL — ABNORMAL LOW (ref 8–23)
CO2: 24 mmol/L (ref 22–32)
Calcium: 8.4 mg/dL — ABNORMAL LOW (ref 8.9–10.3)
Chloride: 107 mmol/L (ref 98–111)
Creatinine, Ser: 0.64 mg/dL (ref 0.44–1.00)
GFR calc Af Amer: >60 mL/min (ref >60)
GFR calc non Af Amer: >60 mL/min (ref >60)
Glucose, Bld: 137 mg/dL — ABNORMAL HIGH (ref 70–99)
Potassium: 4 mmol/L (ref 3.5–5.1)
Sodium: 139 mmol/L (ref 135–145)
Total Bilirubin: 0.9 mg/dL (ref 0.3–1.2)
Total Protein: 5.4 g/dL — ABNORMAL LOW (ref 6.5–8.1)

## 2020-01-12 LAB — CBC
HCT: 31.3 % — ABNORMAL LOW (ref 36.0–46.0)
Hemoglobin: 9.9 g/dL — ABNORMAL LOW (ref 12.0–15.0)
MCH: 30.3 pg (ref 26.0–34.0)
MCHC: 31.6 g/dL (ref 30.0–36.0)
MCV: 95.7 fL (ref 80.0–100.0)
Platelets: 148 10*3/uL — ABNORMAL LOW (ref 150–400)
RBC: 3.27 MIL/uL — ABNORMAL LOW (ref 3.87–5.11)
RDW: 16.2 % — ABNORMAL HIGH (ref 11.5–15.5)
WBC: 6.9 10*3/uL (ref 4.0–10.5)
nRBC: 0 % (ref 0.0–0.2)

## 2020-01-12 MED ORDER — ADULT MULTIVITAMIN W/MINERALS CH
1.0000 | ORAL_TABLET | Freq: Every day | ORAL | Status: DC
  Administered 2020-01-13 – 2020-01-24 (×12): 1 via ORAL
  Filled 2020-01-12 (×12): qty 1

## 2020-01-12 MED ORDER — SODIUM CHLORIDE 0.9 % IV SOLN: INTRAVENOUS | Status: AC

## 2020-01-12 MED ORDER — LIDOCAINE HCL 1 % IJ SOLN
INTRAMUSCULAR | Status: AC
  Filled 2020-01-12: qty 20

## 2020-01-12 MED ORDER — GLUCERNA SHAKE PO LIQD
237.0000 mL | Freq: Three times a day (TID) | ORAL | Status: DC
  Administered 2020-01-13 – 2020-01-24 (×30): 237 mL via ORAL
  Filled 2020-01-12 (×36): qty 237

## 2020-01-12 MED ORDER — MIDAZOLAM HCL 2 MG/2ML IJ SOLN
INTRAMUSCULAR | Status: AC | PRN
  Administered 2020-01-12: 1 mg via INTRAVENOUS

## 2020-01-12 MED ORDER — FENTANYL CITRATE (PF) 100 MCG/2ML IJ SOLN
INTRAMUSCULAR | Status: AC
  Filled 2020-01-12: qty 2

## 2020-01-12 MED ORDER — MIDAZOLAM HCL 2 MG/2ML IJ SOLN
INTRAMUSCULAR | Status: AC
  Filled 2020-01-12: qty 2

## 2020-01-12 MED ORDER — FENTANYL CITRATE (PF) 100 MCG/2ML IJ SOLN
INTRAMUSCULAR | Status: AC | PRN
  Administered 2020-01-12: 25 ug via INTRAVENOUS

## 2020-01-12 NOTE — Progress Notes (Signed)
Called [facility] for transport and report given to them about pt.

## 2020-01-12 NOTE — Sedation Documentation (Signed)
Patient placed back on 2 L via [place] and sats 97%.

## 2020-01-12 NOTE — Sedation Documentation (Signed)
Airway 2 per Dr. Kathlene Cote at the bedside

## 2020-01-12 NOTE — Progress Notes (Signed)
PROGRESS NOTE    [name]  KKX:[id number] DOB: 09/03/1940 DOA: 01/10/2020 PCP: Rosita Fire, MD  Brief Narrative: This is an 79 year old female with history of diabetes mellitus, hypertension, dyslipidemia, COPD, tobacco abuse was admitted with cecal diverticulitis and perforation from 5/14 through 5/21 to [facility], treated with antibiotics, did not require a drain or surgery. -Was discharged to SNF for rehabilitation and was also followed by surgery following discharge. -Presented to the ED at [facility] with worsening abdominal pain, weakness, found to have new large RUL Lung mass, extensive adenopathy, early SVC syndrome and continued thickening of the cecal area, radiologist raises suspicion for malignancy.  Assessment & Plan:   Sepsis Pasteurella bacteremia Recent cecal perforation/presumed diverticulitis -Source is possibly UTI, cannot rule out cecal origin, recent perforation/treated for diverticulitis with antibiotics 5/14-21 -continue IVF and IV Zosyn day2, follow-up urine cultures -CT reports possible cecal mass, or this could be residual inflammation following recent perforation less likely additional malignancy -Colonoscopy in 2019 noted diverticulosis and multiple polyps which were removed -Will need GI evaluation and repeat colonoscopy in the near future when more stable  Right upper lobe lung mass Extensive mediastinal lymphadenopathy Early SVC syndrome, right arm swelling -Pulmonary consult, discussed with Dr. Valeta Harms, greatly appreciate input -Started low-dose Decadron -IR consulted for percutaneous CT-guided biopsy since the mass is noted to be invading right chest wall -Also consulted, discussed with radiation oncology Dr. Sondra Come 6/13, Rad Onc will see her at [facility] [place] after transfer,  -Transfer to [place] long after biopsy  History of stage I breast CA -Treated with mastectomy in 2010, followed by [place]  COPD Longstanding  tobacco abuse -Concern for interstitial lung disease on CT chest  -Stable, nebs as needed -Monitor, anticipate will need O2 at discharge  Diabetes mellitus -Hold oral hypoglycemics, sliding scale insulin for now  Right arm swelling -Due to SVC syndrome  DVT prophylaxis: Heparin on hold for biopsy, resume post biopsy Code Status: DNR Family Communication: No family at bedside, unable to reach granddaughter, attempted twice, finally called and updated grandson Cherelle Midkiff 6/13 Disposition Plan:  Status is: Inpatient  Remains inpatient appropriate because:Inpatient level of care appropriate due to severity of illness   Dispo: The patient is from: Home              Anticipated d/c is to: SNF              Anticipated d/c date is: > 3 days              Patient currently is not medically stable to d/c.   Consultants:   Pulm  Rad Onc Dr.Kinard   Procedures:   Antimicrobials:    Subjective: -Feels better, denies any abdominal pain today, no nausea or vomiting, breathing okay overall, awaiting biopsy  Objective: Vitals:   01/11/20 0803 01/11/20 1343 01/11/20 2105 01/12/20 0429  BP: (!) 151/66 (!) 145/69 (!) 109/53 (!) 115/55  Pulse: 85 88 80 69  Resp: 17 18 18 16   Temp: 98.2 F (36.8 C) 98.2 F (36.8 C) 97.8 F (36.6 C) (!) 97.4 F (36.3 C)  TempSrc: Oral Oral Oral Oral  SpO2: 96% 99% 100% 98%  Weight:      Height:        Intake/Output Summary (Last 24 hours) at 01/12/2020 1155 Last data filed at 01/11/2020 1500 Gross per 24 hour  Intake 307.82 ml  Output --  Net 307.82 ml   [employer]   01/10/20  1303  Weight: 63 kg    Examination:  General exam: Elderly frail female sitting up in bed, AAOx3, no distress HEENT: No JVD CVS: S1-S2, regular rate rhythm Lungs with poor air movement bilaterally, few scattered rhonchi Abdomen: Soft, mild diffuse lower abdomen tenderness, bowel sounds present Extremities: Right arm swelling Skin: As above Psychiatry:  Mood & affect appropriate.     Data Reviewed:   CBC: Recent Labs  Lab 01/08/20 1357 01/10/20 1315 01/12/20 0458  WBC 8.7 19.0* 6.9  NEUTROABS 6.6 17.4*  --   HGB 12.0 12.2 9.9*  HCT 38.3 38.1 31.3*  MCV 95.5 93.8 95.7  PLT 210 177 419*   Basic Metabolic Panel: Recent Labs  Lab 01/08/20 1357 01/10/20 1315 01/12/20 0458  NA 131* 132* 139  K 4.6 4.1 4.0  CL 97* 97* 107  CO2 23 25 24   GLUCOSE 110* 156* 137*  BUN 9 10 5*  CREATININE 0.76 0.84 0.64  CALCIUM 9.8 9.1 8.4*   GFR: Estimated Creatinine Clearance: 50.5 mL/min (by C-G formula based on SCr of 0.64 mg/dL). Liver Function Tests: Recent Labs  Lab 01/08/20 1357 01/10/20 1315 01/12/20 0458  AST 18 13* 12*  ALT 10 9 8   ALKPHOS 41 43 32*  BILITOT 0.3 0.9 0.9  PROT 6.8 7.0 5.4*  ALBUMIN 3.5 3.4* 2.3*   No results for input(s): LIPASE, AMYLASE in the last 168 hours. No results for input(s): AMMONIA in the last 168 hours. Coagulation Profile: Recent Labs  Lab 01/10/20 1315  INR 1.0   Cardiac Enzymes: No results for input(s): CKTOTAL, CKMB, CKMBINDEX, TROPONINI in the last 168 hours. BNP (last 3 results) No results for input(s): PROBNP in the last 8760 hours. HbA1C: No results for input(s): HGBA1C in the last 72 hours. CBG: Recent Labs  Lab 01/11/20 1248 01/11/20 1653 01/11/20 2102 01/12/20 0737 01/12/20 1131  GLUCAP 149* 200* 184* 118* 105*   Lipid Profile: No results for input(s): CHOL, HDL, LDLCALC, TRIG, CHOLHDL, LDLDIRECT in the last 72 hours. Thyroid Function Tests: No results for input(s): TSH, T4TOTAL, FREET4, T3FREE, THYROIDAB in the last 72 hours. Anemia Panel: No results for input(s): VITAMINB12, FOLATE, FERRITIN, TIBC, IRON, RETICCTPCT in the last 72 hours. Urine analysis:    Component Value Date/Time   COLORURINE AMBER (A) 01/10/2020 1400   APPEARANCEUR CLOUDY (A) 01/10/2020 1400   LABSPEC 1.014 01/10/2020 1400   PHURINE 7.0 01/10/2020 1400   GLUCOSEU NEGATIVE 01/10/2020 1400    HGBUR NEGATIVE 01/10/2020 1400   BILIRUBINUR NEGATIVE 01/10/2020 1400   KETONESUR NEGATIVE 01/10/2020 1400   PROTEINUR 30 (A) 01/10/2020 1400   UROBILINOGEN 0.2 08/02/2013 1440   NITRITE POSITIVE (A) 01/10/2020 1400   LEUKOCYTESUR MODERATE (A) 01/10/2020 1400   Sepsis Labs: @LABRCNTIP (procalcitonin:4,lacticidven:4)  ) Recent Results (from the past 240 hour(s))  Blood Culture (routine x 2)     Status: Abnormal (Preliminary result)   Collection Time: 01/10/20  1:15 PM   Specimen: BLOOD  Result Value Ref Range Status   Specimen Description   Final    BLOOD LEFT ANTECUBITAL Performed at [facility], [place] [address], [place], [place] [address]    Special Requests   Final    BOTTLES DRAWN AEROBIC AND ANAEROBIC Blood Culture adequate volume Performed at [facility], [address], [place], [place] [address]    Culture  Setup Time   Final    GRAM NEGATIVE RODS Gram Stain Report Called to,Read Back By and Verified With: SAPPELT,J @ 5329 ON 01/11/20 BY  JUW AEROBIC BOTTLE ONLY GS DONE @ [facility] GRAM NEGATIVE RODS ANAEROBIC BOTTLE ONLY Gram Stain Report Called to,Read Back By and Verified With: PREVIOUSLY CALLED @511  01/11/2020 KAY CRITICAL RESULT CALLED TO, READ BACK BY AND VERIFIED WITH: PHARMD MIKE M [phone] [id number] FCP    Culture (A)  Final    PASTEURELLA MULTOCIDA Usually susceptible to penicillin and other beta lactam agents,quinolones,macrolides and tetracyclines. Performed at [facility], [place] [address] [place], [place] [address]    Report Status PENDING  Incomplete  Blood Culture ID Panel (Reflexed)     Status: None   Collection Time: 01/10/20  1:15 PM  Result Value Ref Range Status   Enterococcus species NOT DETECTED NOT DETECTED Final   Listeria monocytogenes NOT DETECTED NOT DETECTED Final   Staphylococcus species NOT DETECTED NOT DETECTED Final   Staphylococcus aureus (BCID) NOT DETECTED NOT DETECTED Final   Streptococcus species NOT DETECTED NOT DETECTED  Final   Streptococcus agalactiae NOT DETECTED NOT DETECTED Final   Streptococcus pneumoniae NOT DETECTED NOT DETECTED Final   Streptococcus pyogenes NOT DETECTED NOT DETECTED Final   Acinetobacter baumannii NOT DETECTED NOT DETECTED Final   Enterobacteriaceae species NOT DETECTED NOT DETECTED Final   Enterobacter cloacae complex NOT DETECTED NOT DETECTED Final   Escherichia coli NOT DETECTED NOT DETECTED Final   Klebsiella oxytoca NOT DETECTED NOT DETECTED Final   Klebsiella pneumoniae NOT DETECTED NOT DETECTED Final   Proteus species NOT DETECTED NOT DETECTED Final   Serratia marcescens NOT DETECTED NOT DETECTED Final   Haemophilus influenzae NOT DETECTED NOT DETECTED Final   Neisseria meningitidis NOT DETECTED NOT DETECTED Final   Pseudomonas aeruginosa NOT DETECTED NOT DETECTED Final   Candida albicans NOT DETECTED NOT DETECTED Final   Candida glabrata NOT DETECTED NOT DETECTED Final   Candida krusei NOT DETECTED NOT DETECTED Final   Candida parapsilosis NOT DETECTED NOT DETECTED Final   Candida tropicalis NOT DETECTED NOT DETECTED Final    Comment: Performed at [facility], [place] [address], [place], [place] [address]  Blood Culture (routine x 2)     Status: None (Preliminary result)   Collection Time: 01/10/20  1:47 PM   Specimen: BLOOD LEFT ARM  Result Value Ref Range Status   Specimen Description BLOOD LEFT ARM BOTTLES DRAWN AEROBIC AND ANAEROBIC  Final   Special Requests Blood Culture adequate volume  Final   Culture   Final    NO GROWTH 2 DAYS Performed at [facility] [address], [place], [place] [address]    Report Status PENDING  Incomplete  Urine culture     Status: Abnormal (Preliminary result)   Collection Time: 01/10/20  2:00 PM   Specimen: Urine, Catheterized  Result Value Ref Range Status   Specimen Description   Final    URINE, CATHETERIZED Performed at [facility], [address] [place], [place] [address]    Special Requests   Final     NONE Performed at [facility], [address] [place], [place] [address]    Culture (A)  Final    >=100,000 COLONIES/mL KLUYVERA ASCORBATA SUSCEPTIBILITIES TO FOLLOW Performed at [facility], [place] [address], [place], [place] [address]    Report Status PENDING  Incomplete  SARS Coronavirus 2 by RT PCR (hospital order, performed in [facility] hospital lab) Nasopharyngeal Nasopharyngeal Swab     Status: None   Collection Time: 01/10/20  2:00 PM   Specimen: Nasopharyngeal Swab  Result Value Ref Range Status  SARS Coronavirus 2 NEGATIVE NEGATIVE Final    Comment: (NOTE) SARS-CoV-2 target nucleic acids are NOT DETECTED.  The SARS-CoV-2 RNA is generally detectable in upper and lower respiratory specimens during the acute phase of infection. The lowest concentration of SARS-CoV-2 viral copies this assay can detect is 250 copies / mL. A negative result does not preclude SARS-CoV-2 infection and should not be used as the sole basis for treatment or other patient management decisions.  A negative result may occur with improper specimen collection / handling, submission of specimen other than nasopharyngeal swab, presence of viral mutation(s) within the areas targeted by this assay, and inadequate number of viral copies (<250 copies / mL). A negative result must be combined with clinical observations, patient history, and epidemiological information.  Fact Sheet for Patients:   StrictlyIdeas.no  Fact Sheet for Healthcare Providers: BankingDealers.co.za  This test is not yet approved or  cleared by the [place] FDA and has been authorized for detection and/or diagnosis of SARS-CoV-2 by FDA under an Emergency Use Authorization (EUA).  This EUA will remain in effect (meaning this test can be used) for the duration of the COVID-19 declaration under Section 564(b)(1) of the Act, 21 U.S.C. section 360bbb-3(b)(1), unless the  authorization is terminated or revoked sooner.  Performed at [facility], [address] [place], [place] [address]          Radiology Studies: CT Chest W Contrast  Result Date: 01/10/2020 CLINICAL DATA:  Fever, hypotension, and abdominal pain. EXAM: CT CHEST, ABDOMEN, AND PELVIS WITH CONTRAST TECHNIQUE: Multidetector CT imaging of the chest, abdomen and pelvis was performed following the standard protocol during bolus administration of intravenous contrast. CONTRAST:  1106mL OMNIPAQUE IOHEXOL 300 MG/ML  SOLN COMPARISON:  Chest x-ray from same day. CT abdomen pelvis dated Dec 15, 2019. CT chest dated August 26, 2012. FINDINGS: CT CHEST FINDINGS Cardiovascular: Normal heart size. No pericardial effusion. No thoracic aortic aneurysm or dissection. Coronary, aortic arch, and branch vessel atherosclerotic vascular disease. No central pulmonary embolism. Mediastinum/Nodes: Conglomerate mediastinal lymphadenopathy measures 6.2 x 3.4 cm and invades and severely narrows the SVC. Enlarged high right paratracheal lymph node measuring 1.9 cm in short axis. Enlarged bilateral hilar lymph nodes measuring up to 1.8 cm in short axis. No enlarged axillary lymph nodes. Prominent 8 mm right internal mamillary lymph node (series 2, image 18). Prior right axillary lymph node dissection. Subcentimeter hypodense nodules in both thyroid lobes. Not clinically significant; no follow-up imaging recommended. The trachea and esophagus demonstrate no significant findings. Lungs/Pleura: Large 5.7 x 5.6 cm mass in the anterior right upper lobe with invasion of the chest wall and right first and second ribs. Focal narrowing of the right subclavian vein just superior to the mass. Small satellite nodules in the right upper lobe measuring up the mm (series 3, image 54). Multiple nodules in the left upper lobe measuring up to 2.2 cm (series 3, image 68). 9 mm nodule in the left lower lobe (series 3, image 100). Progressive basilar  predominant peripheral subpleural reticulation and honeycombing throughout both lungs. No focal consolidation, pleural effusion, or pneumothorax. Musculoskeletal: Direct invasion of the right first and second ribs as above. Severe T1 compression fracture. CT ABDOMEN PELVIS FINDINGS Hepatobiliary: No focal liver abnormality is seen. No gallstones, gallbladder wall thickening, or biliary dilatation. Pancreas: Unremarkable. No pancreatic ductal dilatation or surrounding inflammatory changes. Spleen: Normal in size without focal abnormality. Adrenals/Urinary Tract: The adrenal glands are unremarkable. Unchanged small bilateral renal cysts. No renal calculi or  hydronephrosis. Mild circumferential bladder wall thickening. Stomach/Bowel: Progressive circumferential wall thickening cecum with enlarging mass anteriorly, currently measuring 3.9 x 3.7 cm, previously 2.9 x 3.1 cm. Surrounding inflammatory changes have resolved. Diffuse colonic diverticulosis. The stomach and small bowel are unremarkable. Vascular/Lymphatic: Aortic atherosclerosis. No enlarged abdominal or pelvic lymph nodes. Reproductive: Status post hysterectomy. No adnexal masses. Other: No free fluid or pneumoperitoneum. Unchanged small area of fat necrosis near the upper pole the right kidney. Musculoskeletal: No acute or significant osseous findings. IMPRESSION: Chest: 1. Large 5.7 x 5.6 cm mass in the anterior right upper lobe with invasion of the chest wall and right first and second ribs, consistent with primary bronchogenic carcinoma. 2. Ipsi- and contralateral nodal and pulmonary metastatic disease. Conglomerate mediastinal lymphadenopathy significantly narrows the SVC. 3. Severe T1 compression fracture, possibly pathologic. 4. UIP-type interstitial lung disease, progressed since 2014. 5. Aortic Atherosclerosis (ICD10-I70.0). Abdomen and pelvis: 1. Enlarging mass associated with the anterior cecum, currently measuring 3.9 x 3.7 cm, previously 2.9 x  3.1 cm. Surrounding inflammatory changes have resolved. Findings are concerning for primary colon cancer. Electronically Signed   By: Titus Dubin M.D.   On: 01/10/2020 15:45   CT ABDOMEN PELVIS W CONTRAST  Result Date: 01/10/2020 CLINICAL DATA:  Fever, hypotension, and abdominal pain. EXAM: CT CHEST, ABDOMEN, AND PELVIS WITH CONTRAST TECHNIQUE: Multidetector CT imaging of the chest, abdomen and pelvis was performed following the standard protocol during bolus administration of intravenous contrast. CONTRAST:  126mL OMNIPAQUE IOHEXOL 300 MG/ML  SOLN COMPARISON:  Chest x-ray from same day. CT abdomen pelvis dated Dec 15, 2019. CT chest dated August 26, 2012. FINDINGS: CT CHEST FINDINGS Cardiovascular: Normal heart size. No pericardial effusion. No thoracic aortic aneurysm or dissection. Coronary, aortic arch, and branch vessel atherosclerotic vascular disease. No central pulmonary embolism. Mediastinum/Nodes: Conglomerate mediastinal lymphadenopathy measures 6.2 x 3.4 cm and invades and severely narrows the SVC. Enlarged high right paratracheal lymph node measuring 1.9 cm in short axis. Enlarged bilateral hilar lymph nodes measuring up to 1.8 cm in short axis. No enlarged axillary lymph nodes. Prominent 8 mm right internal mamillary lymph node (series 2, image 18). Prior right axillary lymph node dissection. Subcentimeter hypodense nodules in both thyroid lobes. Not clinically significant; no follow-up imaging recommended. The trachea and esophagus demonstrate no significant findings. Lungs/Pleura: Large 5.7 x 5.6 cm mass in the anterior right upper lobe with invasion of the chest wall and right first and second ribs. Focal narrowing of the right subclavian vein just superior to the mass. Small satellite nodules in the right upper lobe measuring up the mm (series 3, image 54). Multiple nodules in the left upper lobe measuring up to 2.2 cm (series 3, image 68). 9 mm nodule in the left lower lobe (series 3,  image 100). Progressive basilar predominant peripheral subpleural reticulation and honeycombing throughout both lungs. No focal consolidation, pleural effusion, or pneumothorax. Musculoskeletal: Direct invasion of the right first and second ribs as above. Severe T1 compression fracture. CT ABDOMEN PELVIS FINDINGS Hepatobiliary: No focal liver abnormality is seen. No gallstones, gallbladder wall thickening, or biliary dilatation. Pancreas: Unremarkable. No pancreatic ductal dilatation or surrounding inflammatory changes. Spleen: Normal in size without focal abnormality. Adrenals/Urinary Tract: The adrenal glands are unremarkable. Unchanged small bilateral renal cysts. No renal calculi or hydronephrosis. Mild circumferential bladder wall thickening. Stomach/Bowel: Progressive circumferential wall thickening cecum with enlarging mass anteriorly, currently measuring 3.9 x 3.7 cm, previously 2.9 x 3.1 cm. Surrounding inflammatory changes have resolved. Diffuse colonic diverticulosis. The stomach  and small bowel are unremarkable. Vascular/Lymphatic: Aortic atherosclerosis. No enlarged abdominal or pelvic lymph nodes. Reproductive: Status post hysterectomy. No adnexal masses. Other: No free fluid or pneumoperitoneum. Unchanged small area of fat necrosis near the upper pole the right kidney. Musculoskeletal: No acute or significant osseous findings. IMPRESSION: Chest: 1. Large 5.7 x 5.6 cm mass in the anterior right upper lobe with invasion of the chest wall and right first and second ribs, consistent with primary bronchogenic carcinoma. 2. Ipsi- and contralateral nodal and pulmonary metastatic disease. Conglomerate mediastinal lymphadenopathy significantly narrows the SVC. 3. Severe T1 compression fracture, possibly pathologic. 4. UIP-type interstitial lung disease, progressed since 2014. 5. Aortic Atherosclerosis (ICD10-I70.0). Abdomen and pelvis: 1. Enlarging mass associated with the anterior cecum, currently measuring  3.9 x 3.7 cm, previously 2.9 x 3.1 cm. Surrounding inflammatory changes have resolved. Findings are concerning for primary colon cancer. Electronically Signed   By: Titus Dubin M.D.   On: 01/10/2020 15:45   DG Chest Port 1 View  Result Date: 01/10/2020 CLINICAL DATA:  Cough, sepsis EXAM: PORTABLE CHEST 1 VIEW COMPARISON:  Multiple prior studies most recent chest x-ray from 12/12/2019 FINDINGS: Findings in the RIGHT upper chest with increasing confluent density in this area measuring 8.7 x 5.7 cm. Nodule in the LEFT chest at 1.4 cm. Apical scarring. Diffuse increased interstitial markings superimposed on pulmonary emphysema. Signs of atherosclerosis. No evidence of pleural effusion. Cardiomediastinal contours are normal. There is however fullness of the RIGHT hilum Absence of rib shadows of anterior ribs. No additional bony abnormality on limited assessment. IMPRESSION: 1. Suspected mass in the RIGHT upper lobe with anterior rib destruction and associated LEFT lower lobe nodule. CT of the chest is recommended for further evaluation. 2. Fullness of the RIGHT hilum raising the question of RIGHT hilar adenopathy. 3. Diffused increased interstitial markings could represent chronic lung disease, pneumonitis or lymphangitic tumor is also considered. These results were called by telephone at the time of interpretation on 01/10/2020 at 2:27 pm to provider [facility] , who verbally acknowledged these results. Electronically Signed   By: Zetta Bills M.D.   On: 01/10/2020 14:24   VAS [place] UPPER EXTREMITY VENOUS DUPLEX  Result Date: 01/11/2020 UPPER VENOUS STUDY  Indications: Pain, Swelling, and Erythema Other Indications: Cellulitis with sepsis. Comparison Study: Prior study from 09/06/2017 is available for comparison Performing Technologist: Sharion Dove RVS  Examination Guidelines: A complete evaluation includes B-mode imaging, spectral Doppler, color Doppler, and power Doppler as needed of all accessible  portions of each vessel. Bilateral testing is considered an integral part of a complete examination. Limited examinations for reoccurring indications may be performed as noted.  Right Findings: +----------+------------+---------+-----------+----------+-------+ RIGHT     CompressiblePhasicitySpontaneousPropertiesSummary +----------+------------+---------+-----------+----------+-------+ IJV           Full       Yes       Yes                      +----------+------------+---------+-----------+----------+-------+ Subclavian    Full       Yes       Yes                      +----------+------------+---------+-----------+----------+-------+ Axillary      Full       Yes       Yes                      +----------+------------+---------+-----------+----------+-------+ Brachial  Full       Yes       Yes                      +----------+------------+---------+-----------+----------+-------+ Radial        Full                                          +----------+------------+---------+-----------+----------+-------+ Ulnar         Full                                          +----------+------------+---------+-----------+----------+-------+ Cephalic      Full                                          +----------+------------+---------+-----------+----------+-------+ Basilic       Full                                          +----------+------------+---------+-----------+----------+-------+  Left Findings: +----------+------------+---------+-----------+----------+-------+ LEFT      CompressiblePhasicitySpontaneousPropertiesSummary +----------+------------+---------+-----------+----------+-------+ Subclavian    Full       Yes       Yes                      +----------+------------+---------+-----------+----------+-------+  Summary:  Right: No evidence of deep vein thrombosis in the upper extremity. No evidence of superficial vein thrombosis in the  upper extremity.  Left: No evidence of thrombosis in the subclavian.  *See table(s) above for measurements and observations.  Diagnosing physician: Monica Martinez MD Electronically signed by Monica Martinez MD on 01/11/2020 at 11:15:47 AM.    Final         Scheduled Meds: . dexamethasone (DECADRON) injection  4 mg Intravenous Q24H  . [START ON 01/13/2020] feeding supplement (GLUCERNA SHAKE)  237 mL Oral TID BM  . ferrous sulfate  325 mg Oral Q breakfast  . gabapentin  600 mg Oral TID  . insulin aspart  0-15 Units Subcutaneous TID WC  . metoprolol tartrate  12.5 mg Oral BID  . mometasone-formoterol  2 puff Inhalation BID  . [START ON 01/13/2020] multivitamin with minerals  1 tablet Oral Daily  . pantoprazole  40 mg Oral Daily  . pravastatin  40 mg Oral q1800  . QUEtiapine  12.5 mg Oral QHS  . senna  1 tablet Oral BID   Continuous Infusions: . sodium chloride 75 mL/hr at 01/12/20 0326  . piperacillin-tazobactam (ZOSYN)  IV 3.375 g (01/12/20 1109)     LOS: 2 days    Time spent: 21min  Domenic Polite, MD [facility]  01/12/2020, 11:55 AM

## 2020-01-12 NOTE — Progress Notes (Addendum)
Initial Nutrition Assessment  RD working remotely.  DOCUMENTATION CODES:   Not applicable  INTERVENTION:   -Once diet is advanced, add:   -MVI with minerals daily -Glucerna Shake po TID, each supplement provides 220 kcal and 10 grams of protein  NUTRITION DIAGNOSIS:   Increased nutrient needs related to acute illness (rt arm cellulitis with sepsis) as evidenced by estimated needs.  GOAL:   Patient will meet greater than or equal to 90% of their needs  MONITOR:   PO intake, Supplement acceptance, Diet advancement, Labs, Weight trends, Skin, I & O's  REASON FOR ASSESSMENT:   Malnutrition Screening Tool    ASSESSMENT:   [name] is a 79 y.o. female with medical history significant of HTN, CVA, Intraductal carcinoma right breast with mastectomy in 2010 followed by 5 years of horrmonal therapy with a clean follow up 04/29/19, recent hospitalization for cecal diverticulitis with perforation that did not require surgery or drain placement.  Pt admitted with rt arm cellulitis with sepsis.  Reviewed I/O's: +308 ml x 24 hours and +1.1 L since admisison  Attempted to speak with pt via phone, however, no answer.   Per IR notes, CT of chest reveals large rt upper lobe mass with invasion of chest wall and rt first and secondary ribs, concerning for primary bronchogenic carcinoma. Pt is currently NPO for transthoracic needle biopsy. Possible transfer to [facility] for XRT per PCCM.   Reviewed wt hx; wt has been stable over the past 9 months.   Medications reviewed and include decadron, ferrous sulfate, pravachol, senokot, zosyn, and 0.9% sodium chloride infusion @ 75 ml/hr.   Lab Results  Component Value Date   HGBA1C 5.9 (H) 12/12/2019   PTA DM medications are 5 mg tradjenta daily.   Labs reviewed: CBGS: 118-200 (inpatient orders for glycemic control are 0-15 units insulin aspart TID with meals).   Diet Order:   Diet Order            Diet NPO time specified  Diet effective  midnight                 EDUCATION NEEDS:   No education needs have been identified at this time  Skin:  Skin Assessment: Skin Integrity Issues: Skin Integrity Issues:: Other (Comment) Other: rt arm cellulitis  Last BM:  01/12/20  Height:   Ht Readings from Last 1 Encounters:  01/10/20 5\' 5"  (1.651 m)    Weight:   Wt Readings from Last 1 Encounters:  01/10/20 63 kg    Ideal Body Weight:  56.8 kg  BMI:  Body mass index is 23.11 kg/m.  Estimated Nutritional Needs:   Kcal:  1600-1800  Protein:  80-95 grams  Fluid:  > 1.6 L    Loistine Chance, RD, LDN, [place] Registered Dietitian II Certified Diabetes Care and Education Specialist Please refer to [facility] for RD and/or RD on-call/weekend/after hours pager

## 2020-01-12 NOTE — Progress Notes (Signed)
Called Cardiac Monitoring and notified them pt back in room, SR 24s.

## 2020-01-12 NOTE — Progress Notes (Signed)
[name] would like to be contacted first regarding her care.

## 2020-01-12 NOTE — Progress Notes (Signed)
°  Radiation Oncology         [phone] ________________________________  Name: ZAKEYA JUNKER MRN: [id number]  Date: 01/13/2020  DOB: [birth year]  SIMULATION AND TREATMENT PLANNING NOTE- INPATIENT    ICD-10-CM   1. Mass of upper lobe of right lung  R91.8     DIAGNOSIS: Newly found right upper lobe mass concerning for primary bronchogenic carcinoma versus recurrent breast cancer and/or colon cancer pending biopsy  NARRATIVE:  The patient was brought to the [facility].  Identity was confirmed.  All relevant records and images related to the planned course of therapy were reviewed.  The patient freely provided informed written consent to proceed with treatment after reviewing the details related to the planned course of therapy. The consent form was witnessed and verified by the simulation staff.  Then, the patient was set-up in a stable reproducible  supine position for radiation therapy.  CT images were obtained.  Surface markings were placed.  The CT images were loaded into the planning software.  Then the target and avoidance structures were contoured.  Treatment planning then occurred.  The radiation prescription was entered and confirmed.  Then, I designed and supervised the construction of a total of 4 medically necessary complex treatment devices.  I have requested : 3D Simulation  I have requested a DVH of the following structures: GTV, PTV, heart, lungs, spinal cord, esophagus.  I have ordered:dose calc.  PLAN:  The patient will receive 35 Gy in 14 fractions. Treatments to begin later today.  -----------------------------------  Blair Promise, PhD, MD  This document serves as a record of services personally performed by Gery Pray, MD. It was created on his behalf by Clerance Lav, a trained medical scribe. The creation of this record is based on the scribe's personal observations and the provider's statements to them. This document has been checked and approved by the  attending provider.

## 2020-01-12 NOTE — Procedures (Signed)
Interventional Radiology Procedure Note  Procedure: CT Guided Biopsy of RUL lung mass  Complications: None  Estimated Blood Loss: < 10 mL  Findings: 18 G core biopsy of RUL lung mass performed under CT guidance.  Three core samples obtained and sent to Pathology.  Venetia Night. Kathlene Cote, M.D Pager:  [phone]

## 2020-01-12 NOTE — Progress Notes (Signed)
Called for dinner tray since pt has not eaten since back from procedure.

## 2020-01-12 NOTE — Progress Notes (Signed)
[name], grandson called and informed that pt went for biopsy.

## 2020-01-12 NOTE — Progress Notes (Signed)
PCCM Interval Note  I have reviewed the patient's films, agree that she needs transthoracic needle biopsy in interventional radiology.  She would then probably move to [facility] to initiate XRT.  I have made her an appointment in our office with Dr. Chase Caller 01/29/2020 at 15:45 to evaluate her ILD/UIP.  Please call if we can assist further.  Baltazar Apo, MD, PhD 01/12/2020, 9:49 AM [facility] [phone] or if no answer [phone]

## 2020-01-12 NOTE — Patient Outreach (Signed)
[facility]) Care Management  01/12/2020  [name] [birth year] [id number]     Transition of Care Referral  Referral Date: 01/12/2020 Referral Source: [facility] [place] Discharge Report Date of Discharge: 01/08/2020 Facility: [facility] Insurance: [facility]   Referral received. Per EMR patient currently admitted as inpatient.     Plan: RN CM will close case at this time.    Enzo Montgomery, RN,BSN,CCM [place] Management Telephonic Care Management Coordinator Direct Phone: [phone] Toll Free: [phone] Fax: [phone]

## 2020-01-12 NOTE — Progress Notes (Signed)
Eliezer Lofts at [phone] (Dr. Lois Huxley nurse) and left a message that the pt would be transferring to [facility] 1614-01.

## 2020-01-12 NOTE — Progress Notes (Signed)
Pt ready for IR biopsy, they called and will be coming up with transport to get pt. Pt/family informed.

## 2020-01-12 NOTE — Progress Notes (Signed)
Bandaide intact right upper chest from lung bx.

## 2020-01-12 NOTE — Progress Notes (Signed)
Report given to Dena Billet, RN for Room 1614 at [facility], will contact [facility] for transport.  [name], contacted and told that pt will be transported to [facility].

## 2020-01-13 ENCOUNTER — Ambulatory Visit
Admit: 2020-01-13 | Discharge: 2020-01-13 | Disposition: A | Discharge status: Home / Self Care | Payer: Medicare HMO | Attending: Radiation Oncology | Admitting: Radiation Oncology

## 2020-01-13 ENCOUNTER — Encounter: Payer: Self-pay | Admitting: Radiation Oncology

## 2020-01-13 DIAGNOSIS — R918 Other nonspecific abnormal finding of lung field: Secondary | ICD-10-CM

## 2020-01-13 DIAGNOSIS — I871 Compression of vein: Secondary | ICD-10-CM

## 2020-01-13 LAB — GLUCOSE, CAPILLARY
Glucose-Capillary: 100 mg/dL — ABNORMAL HIGH (ref 70–99)
Glucose-Capillary: 127 mg/dL — ABNORMAL HIGH (ref 70–99)
Glucose-Capillary: 154 mg/dL — ABNORMAL HIGH (ref 70–99)
Glucose-Capillary: 157 mg/dL — ABNORMAL HIGH (ref 70–99)

## 2020-01-13 LAB — URINE CULTURE: Culture: >=100000 — AB

## 2020-01-13 LAB — CULTURE, BLOOD (ROUTINE X 2): Special Requests: ADEQUATE

## 2020-01-13 MED ORDER — POLYETHYLENE GLYCOL 3350 17 G PO PACK: 17.0000 g | PACK | Freq: Every day | ORAL | Status: DC | PRN

## 2020-01-13 MED ORDER — OXYCODONE HCL 5 MG PO TABS
5.0000 mg | ORAL_TABLET | ORAL | Status: DC | PRN
  Administered 2020-01-14 – 2020-01-20 (×13): 5 mg via ORAL
  Filled 2020-01-13 (×13): qty 1

## 2020-01-13 MED ORDER — IPRATROPIUM-ALBUTEROL 0.5-2.5 (3) MG/3ML IN SOLN: 3.0000 mL | RESPIRATORY_TRACT | Status: DC | PRN

## 2020-01-13 MED ORDER — SENNOSIDES-DOCUSATE SODIUM 8.6-50 MG PO TABS: 2.0000 | ORAL_TABLET | Freq: Every evening | ORAL | Status: DC | PRN

## 2020-01-13 MED ORDER — SODIUM CHLORIDE 0.9 % IV SOLN
3.0000 g | Freq: Four times a day (QID) | INTRAVENOUS | Status: AC
  Administered 2020-01-13 – 2020-01-20 (×29): 3 g via INTRAVENOUS
  Filled 2020-01-13: qty 0.5
  Filled 2020-01-13 (×3): qty 3
  Filled 2020-01-13: qty 8
  Filled 2020-01-13 (×3): qty 3
  Filled 2020-01-13: qty 8
  Filled 2020-01-13 (×2): qty 3
  Filled 2020-01-13: qty 0.5
  Filled 2020-01-13: qty 3
  Filled 2020-01-13 (×2): qty 8
  Filled 2020-01-13: qty 0.5
  Filled 2020-01-13 (×3): qty 3
  Filled 2020-01-13: qty 8
  Filled 2020-01-13 (×8): qty 3
  Filled 2020-01-13: qty 8
  Filled 2020-01-13: qty 3

## 2020-01-13 NOTE — Progress Notes (Signed)
Radiation Oncology         [phone] ________________________________  Initial Inpatient Consultation  Name: [name] MRN: [id number]  Date: 01/13/2020  DOB: [birth year]  XT:GGYIR, Brandon Melnick, MD  Rosita Fire, MD   REFERRING PHYSICIAN: Rosita Fire, MD  DIAGNOSIS: The encounter diagnosis was Mass of upper lobe of right lung.   Newly found right upper lobe mass concerning for primary bronchogenic carcinoma versus recurrent breast cancer and/or colon cancer pending biopsy  History of stage IA (T1b, N0, cM0) invasive ductal carcinoma of the right breast status post modified radical mastectomy of the right breast on 01/22/2009  HISTORY OF PRESENT ILLNESS::[name] is a 79 y.o. female who is seen as an inpatient out of courtesy of Dr. Lamonte Sakai for an opinion concerning radiation therapy as part of management for her recently diagnosed what appears to be stage IV lung cancer.  The patient presented to ED on 01/10/2020 for evaluation of weakness, nausea, cough, right upper extremity swelling, and abdominal pain. Chest x-ray at that time showed a suspected mass in the right upper lobe with anterior rib destruction and associated left lower lobe nodule. There was also noted to be some fullness of the right hilum that raised question of right hilar adenopathy. Finally, there was diffuse increased interstitial marking that could represent chronic lung disease, pneumonitis, or lymphangitic tumor. Given those findings, the patient underwent a CT of chest/abdomen/pelvis that showed a large 5.7 x 5.6 cm mass in the anterior right upper lobe with invasion of the chest wall and right first and second ribs, consistent with primary bronchogenic carcinoma. There was also some ipsi and contralateral nodal and pulmonary metastatic disease and conglomerate mediastinal lymphadenopathy that significantly narrowed the SVC. Finally, there was a severe T1 compression fracture, possibly pathologic, UIP-type  interstitial lung disease that had progressed since 2014, and an enlarging mass associated with the anterior cecum that measured 3.9 x 3.7 cm as compared to previously 2.9 x 3.1 cm, concerning for primary colon cancer. The surrounding inflammatory changes had resolved. The patient was admitted to the hospital for further management.  Of note, she was recently admitted to the hospital from 12/12/2019 - 12/19/2019 for early early perforation of diverticulitis. She was started on antibiotics, was consulted by surgery, and improved clinically without need for surgery at that time.  The patient was seen by Dr. Lake Bells, critical care, on 01/11/2020, who recommended that the patient proceed with CT-guided biopsy and radiation therapy. She was seen this morning by Dr. Lamonte Sakai, who agreed that the patient needed transthoracic needle biopsy in interventional radiology followed by radiation therapy.  The patient underwent a CT-guided core biopsy of the right upper lobe lung mass on 01/12/2020. Pathology is pending at this time.  Of note, the patient does have a history of stage IA (T1b, N0, cM0) invasive ductal carcinoma of the right breast status post modified radical mastectomy of the right breast on 01/22/2009. The patient completed five years of Anastrozole and is being followed by Dr. Delton Coombes. She was last seen on 10/07/2019, during which time she remained under observation. Her last screening mammogram was on 02/05/2019 and did not show any evidence of malignancy.  Patient is seen urgently in radiation therapy given the CT findings and clinical findings of early superior vena cava syndrome.  PREVIOUS RADIATION THERAPY: No  PAST MEDICAL HISTORY:  Past Medical History:  Diagnosis Date   Breast CA ([place]) 01/16/2011   Breast cancer ([place])    rt dx 2010;  mastectomy only   Diverticulitis    Embolism - blood clot    left lung   History of prolapse of bladder    patient has gellhorn pessary    Hypertension    Infiltrating ductal carcinoma of breast ([place]) 01/16/2011   Started Arimidex on 02/24/2009. Stage IB (T1b N0 M0) grade 1 infiltrating ductal carcinoma of the right breast, status post right modified radical mastectomy on 01/22/2009 for a 10 mm cancer, ER +100%, PR +98%, KI-67 marker was 9%, HER-2 negative, no LVI identified, and 11 lymph nodes were negative, all margins were clear. She had some associated DCIS, is now on Arimidex and she will take that for   Stroke [facility])    Tendonitis 07/2010   shot of cortisone    PAST SURGICAL HISTORY: Past Surgical History:  Procedure Laterality Date   ABDOMINAL HYSTERECTOMY  1987   APPENDECTOMY  1974   blood clot in lung   BREAST BIOPSY  1968   left   COLONOSCOPY N/A 01/11/2018   Procedure: COLONOSCOPY;  Surgeon: Danie Binder, MD;  Location: [facility];  Service: Endoscopy;  Laterality: N/A;  10:00   MASTECTOMY MODIFIED RADICAL  01/22/2009   rt   NASAL SINUS SURGERY  2002   POLYPECTOMY  01/11/2018   Procedure: POLYPECTOMY;  Surgeon: Danie Binder, MD;  Location: [facility];  Service: Endoscopy;;  ascending; descending;   TEAR DUCT PROBING     tubal ligation  4503   YAG LASER APPLICATION Right 8/88/2800   Procedure: YAG LASER APPLICATION;  Surgeon: Williams Che, MD;  Location: [facility] ORS;  Service: Ophthalmology;  Laterality: Right;    FAMILY HISTORY:  Family History  Problem Relation Age of Onset   Breast cancer Sister    Breast cancer Maternal Grandmother    Colon cancer Daughter 17   Hypertension Paternal Grandfather    Stroke Paternal Grandfather    Other Paternal Grandmother        hardening of arteries   Other Father        gun shot wound   Cancer Sister    Cancer Daughter    Stroke Son    Heart attack Son    Gastric cancer Neg Hx    Esophageal cancer Neg Hx     SOCIAL HISTORY:  Social History   Tobacco Use   Smoking status: Current Every Day Smoker    Packs/day: 0.50     Years: 62.00    Pack years: 31.00    Types: Cigarettes   Smokeless tobacco: Never Used  Scientific laboratory technician Use: Never used  Substance Use Topics   Alcohol use: Yes    Comment: occ   Drug use: No    ALLERGIES:  Allergies  Allergen Reactions   Pollen Extract Other (See Comments)    Runny nose, watery eyes and ears get stopped up   Red Dye Nausea And Vomiting    MEDICATIONS:  No current facility-administered medications for this encounter.   No current outpatient medications on file.   Facility-Administered Medications Ordered in Other Encounters  Medication Dose Route Frequency Provider Last Rate Last Admin   acetaminophen (TYLENOL) tablet 650 mg  650 mg Oral Q6H PRN Norins, Heinz Knuckles, MD       albuterol (PROVENTIL) (2.5 MG/3ML) 0.083% nebulizer solution 2.5 mg  2.5 mg Nebulization Q6H PRN Norins, Heinz Knuckles, MD       Ampicillin-Sulbactam (UNASYN) 3 g in sodium chloride 0.9 % 100 mL IVPB  3 g Intravenous Q6H Amin, Ankit Chirag, MD 200 mL/hr at 01/13/20 1203 3 g at 01/13/20 1203   dexamethasone (DECADRON) injection 4 mg  4 mg Intravenous Q24H Domenic Polite, MD   4 mg at 01/13/20 1206   feeding supplement (GLUCERNA SHAKE) (GLUCERNA SHAKE) liquid 237 mL  237 mL Oral TID BM Domenic Polite, MD       ferrous sulfate tablet 325 mg  325 mg Oral Q breakfast Norins, Heinz Knuckles, MD   325 mg at 01/13/20 1205   fluticasone (FLONASE) 50 MCG/ACT nasal spray 2 spray  2 spray Each Nare PRN Norins, Heinz Knuckles, MD       gabapentin (NEURONTIN) capsule 600 mg  600 mg Oral TID Neena Rhymes, MD   600 mg at 01/13/20 1100   HYDROmorphone (DILAUDID) injection 1 mg  1 mg Intravenous Q3H PRN Domenic Polite, MD       insulin aspart (novoLOG) injection 0-15 Units  0-15 Units Subcutaneous TID WC Norins, Heinz Knuckles, MD   5 Units at 01/12/20 1807   ipratropium-albuterol (DUONEB) 0.5-2.5 (3) MG/3ML nebulizer solution 3 mL  3 mL Nebulization Q4H PRN Amin, Ankit Chirag, MD       metoprolol  tartrate (LOPRESSOR) tablet 12.5 mg  12.5 mg Oral BID Norins, Heinz Knuckles, MD   12.5 mg at 01/13/20 1100   mometasone-formoterol (DULERA) 200-5 MCG/ACT inhaler 2 puff  2 puff Inhalation BID Norins, Heinz Knuckles, MD   2 puff at 01/12/20 0827   multivitamin with minerals tablet 1 tablet  1 tablet Oral Daily Domenic Polite, MD   1 tablet at 01/13/20 1100   naphazoline-glycerin (CLEAR EYES REDNESS) ophth solution 2 drop  2 drop Both Eyes Daily PRN Norins, Heinz Knuckles, MD       oxyCODONE (Oxy IR/ROXICODONE) immediate release tablet 5 mg  5 mg Oral Q4H PRN Amin, Ankit Chirag, MD       pantoprazole (PROTONIX) EC tablet 40 mg  40 mg Oral Daily Norins, Heinz Knuckles, MD   40 mg at 01/13/20 1100   polyethylene glycol (MIRALAX / GLYCOLAX) packet 17 g  17 g Oral Daily PRN Amin, Ankit Chirag, MD       pravastatin (PRAVACHOL) tablet 40 mg  40 mg Oral q1800 Norins, Heinz Knuckles, MD   40 mg at 01/12/20 1727   QUEtiapine (SEROQUEL) tablet 12.5 mg  12.5 mg Oral QHS Norins, Heinz Knuckles, MD   12.5 mg at 01/12/20 2147   senna (SENOKOT) tablet 8.6 mg  1 tablet Oral BID Norins, Heinz Knuckles, MD   8.6 mg at 01/12/20 2147   senna-docusate (Senokot-S) tablet 2 tablet  2 tablet Oral QHS PRN Amin, Ankit Chirag, MD       umeclidinium bromide (INCRUSE ELLIPTA) 62.5 MCG/INH 1 puff  1 puff Inhalation Daily PRN Norins, Heinz Knuckles, MD        REVIEW OF SYSTEMS:  A 10+ POINT REVIEW OF SYSTEMS WAS OBTAINED including neurology, dermatology, psychiatry, cardiac, respiratory, lymph, extremities, GI, GU, musculoskeletal, constitutional, reproductive, HEENT.  She complains of some right upper chest and back pain.  She also noticed significant swelling in her right arm.  She denies any significant breathing problems when lying flat   PHYSICAL EXAM:  General: Alert and oriented, in no acute distress HEENT: Head is normocephalic. Extraocular movements are intact. Oropharynx is clear. Neck: Neck is supple, no palpable cervical or supraclavicular  lymphadenopathy. Heart: Regular in rate and rhythm with no murmurs, rubs, or gallops. Chest: Clear to auscultation  bilaterally, with no rhonchi, wheezes, or rales.  Mastectomy scar along the right side.  No palpable or visible signs of recurrence of breast cancer.  Abdomen: Soft, nontender, nondistended, with no rigidity or guarding. Extremities: Significant swelling noted at the right arm and shoulder region.  Some distended vessels noted in the right upper shoulder and chest region Lymphatics: see Neck Exam Skin: No concerning lesions. Musculoskeletal: symmetric strength and muscle tone throughout. Neurologic: Cranial nerves II through XII are grossly intact. No obvious focalities. Speech is fluent. Coordination is intact. Psychiatric: Judgment and insight are intact. Affect is appropriate.    ECOG = 2  0 - Asymptomatic (Fully active, able to carry on all predisease activities without restriction)  1 - Symptomatic but completely ambulatory (Restricted in physically strenuous activity but ambulatory and able to carry out work of a light or sedentary nature. For example, light housework, office work)  2 - Symptomatic, <50% in bed during the day (Ambulatory and capable of all self care but unable to carry out any work activities. Up and about more than 50% of waking hours)  3 - Symptomatic, >50% in bed, but not bedbound (Capable of only limited self-care, confined to bed or chair 50% or more of waking hours)  4 - Bedbound (Completely disabled. Cannot carry on any self-care. Totally confined to bed or chair)  5 - Death   Eustace Pen MM, Creech RH, Tormey DC, et al. [phone]). "Toxicity and response criteria of the [employer]". [place] Oncol. 5 (6): 649-55  LABORATORY DATA:  Lab Results  Component Value Date   WBC 6.9 01/12/2020   HGB 9.9 (L) 01/12/2020   HCT 31.3 (L) 01/12/2020   MCV 95.7 01/12/2020   PLT 148 (L) 01/12/2020   NEUTROABS 17.4 (H) 01/10/2020   Lab  Results  Component Value Date   NA 139 01/12/2020   K 4.0 01/12/2020   CL 107 01/12/2020   CO2 24 01/12/2020   GLUCOSE 137 (H) 01/12/2020   CREATININE 0.64 01/12/2020   CALCIUM 8.4 (L) 01/12/2020      RADIOGRAPHY: CT Chest W Contrast  Result Date: 01/10/2020 CLINICAL DATA:  Fever, hypotension, and abdominal pain. EXAM: CT CHEST, ABDOMEN, AND PELVIS WITH CONTRAST TECHNIQUE: Multidetector CT imaging of the chest, abdomen and pelvis was performed following the standard protocol during bolus administration of intravenous contrast. CONTRAST:  157m OMNIPAQUE IOHEXOL 300 MG/ML  SOLN COMPARISON:  Chest x-ray from same day. CT abdomen pelvis dated Dec 15, 2019. CT chest dated August 26, 2012. FINDINGS: CT CHEST FINDINGS Cardiovascular: Normal heart size. No pericardial effusion. No thoracic aortic aneurysm or dissection. Coronary, aortic arch, and branch vessel atherosclerotic vascular disease. No central pulmonary embolism. Mediastinum/Nodes: Conglomerate mediastinal lymphadenopathy measures 6.2 x 3.4 cm and invades and severely narrows the SVC. Enlarged high right paratracheal lymph node measuring 1.9 cm in short axis. Enlarged bilateral hilar lymph nodes measuring up to 1.8 cm in short axis. No enlarged axillary lymph nodes. Prominent 8 mm right internal mamillary lymph node (series 2, image 18). Prior right axillary lymph node dissection. Subcentimeter hypodense nodules in both thyroid lobes. Not clinically significant; no follow-up imaging recommended. The trachea and esophagus demonstrate no significant findings. Lungs/Pleura: Large 5.7 x 5.6 cm mass in the anterior right upper lobe with invasion of the chest wall and right first and second ribs. Focal narrowing of the right subclavian vein just superior to the mass. Small satellite nodules in the right upper lobe measuring up the mm (series  3, image 54). Multiple nodules in the left upper lobe measuring up to 2.2 cm (series 3, image 68). 9 mm nodule in  the left lower lobe (series 3, image 100). Progressive basilar predominant peripheral subpleural reticulation and honeycombing throughout both lungs. No focal consolidation, pleural effusion, or pneumothorax. Musculoskeletal: Direct invasion of the right first and second ribs as above. Severe T1 compression fracture. CT ABDOMEN PELVIS FINDINGS Hepatobiliary: No focal liver abnormality is seen. No gallstones, gallbladder wall thickening, or biliary dilatation. Pancreas: Unremarkable. No pancreatic ductal dilatation or surrounding inflammatory changes. Spleen: Normal in size without focal abnormality. Adrenals/Urinary Tract: The adrenal glands are unremarkable. Unchanged small bilateral renal cysts. No renal calculi or hydronephrosis. Mild circumferential bladder wall thickening. Stomach/Bowel: Progressive circumferential wall thickening cecum with enlarging mass anteriorly, currently measuring 3.9 x 3.7 cm, previously 2.9 x 3.1 cm. Surrounding inflammatory changes have resolved. Diffuse colonic diverticulosis. The stomach and small bowel are unremarkable. Vascular/Lymphatic: Aortic atherosclerosis. No enlarged abdominal or pelvic lymph nodes. Reproductive: Status post hysterectomy. No adnexal masses. Other: No free fluid or pneumoperitoneum. Unchanged small area of fat necrosis near the upper pole the right kidney. Musculoskeletal: No acute or significant osseous findings. IMPRESSION: Chest: 1. Large 5.7 x 5.6 cm mass in the anterior right upper lobe with invasion of the chest wall and right first and second ribs, consistent with primary bronchogenic carcinoma. 2. Ipsi- and contralateral nodal and pulmonary metastatic disease. Conglomerate mediastinal lymphadenopathy significantly narrows the SVC. 3. Severe T1 compression fracture, possibly pathologic. 4. UIP-type interstitial lung disease, progressed since 2014. 5. Aortic Atherosclerosis (ICD10-I70.0). Abdomen and pelvis: 1. Enlarging mass associated with the  anterior cecum, currently measuring 3.9 x 3.7 cm, previously 2.9 x 3.1 cm. Surrounding inflammatory changes have resolved. Findings are concerning for primary colon cancer. Electronically Signed   By: Titus Dubin M.D.   On: 01/10/2020 15:45   CT guided needle placement  Result Date: 01/12/2020 CLINICAL DATA:  Anterior right upper lobe lung mass with rib destruction and chest wall invasion. History of breast carcinoma. EXAM: CT GUIDED CORE BIOPSY OF RIGHT UPPER LOBE LUNG MASS ANESTHESIA/SEDATION: 1.0 mg IV Versed; 25 mcg IV Fentanyl Total Moderate Sedation Time:  13 minutes. The patient's level of consciousness and physiologic status were continuously monitored during the procedure by Radiology nursing. PROCEDURE: The procedure risks, benefits, and alternatives were explained to the patient. Questions regarding the procedure were encouraged and answered. The patient understands and consents to the procedure. A time-out was performed prior to initiating the procedure. CT of the upper chest was performed in a supine position. The right anterior chest wall was prepped with chlorhexidine in a sterile fashion, and a sterile drape was applied covering the operative field. A sterile gown and sterile gloves were used for the procedure. Local anesthesia was provided with 1% Lidocaine. Under CT guidance, a 70 gauge trocar needle was advanced to the level of an anterior right upper lobe lung mass. After confirming needle tip position, 3 separate coaxial 18 gauge core biopsy samples were obtained and submitted in formalin. Additional CT was performed. COMPLICATIONS: None FINDINGS: Lobulated mass centered in the anterior aspect of the right upper lobe and invading the anterior chest wall again noted measuring approximately 6.3 cm in greatest diameter. Solid tissue was obtained from the lateral aspect of the mass. No pneumothorax on post biopsy imaging. IMPRESSION: CT-guided core biopsy performed at the level of the large  right upper lobe anterior mass invading the anterior chest wall. Electronically Signed   By:  Aletta Edouard M.D.   On: 01/12/2020 17:04   CT ABDOMEN PELVIS W CONTRAST  Result Date: 01/10/2020 CLINICAL DATA:  Fever, hypotension, and abdominal pain. EXAM: CT CHEST, ABDOMEN, AND PELVIS WITH CONTRAST TECHNIQUE: Multidetector CT imaging of the chest, abdomen and pelvis was performed following the standard protocol during bolus administration of intravenous contrast. CONTRAST:  122m OMNIPAQUE IOHEXOL 300 MG/ML  SOLN COMPARISON:  Chest x-ray from same day. CT abdomen pelvis dated Dec 15, 2019. CT chest dated August 26, 2012. FINDINGS: CT CHEST FINDINGS Cardiovascular: Normal heart size. No pericardial effusion. No thoracic aortic aneurysm or dissection. Coronary, aortic arch, and branch vessel atherosclerotic vascular disease. No central pulmonary embolism. Mediastinum/Nodes: Conglomerate mediastinal lymphadenopathy measures 6.2 x 3.4 cm and invades and severely narrows the SVC. Enlarged high right paratracheal lymph node measuring 1.9 cm in short axis. Enlarged bilateral hilar lymph nodes measuring up to 1.8 cm in short axis. No enlarged axillary lymph nodes. Prominent 8 mm right internal mamillary lymph node (series 2, image 18). Prior right axillary lymph node dissection. Subcentimeter hypodense nodules in both thyroid lobes. Not clinically significant; no follow-up imaging recommended. The trachea and esophagus demonstrate no significant findings. Lungs/Pleura: Large 5.7 x 5.6 cm mass in the anterior right upper lobe with invasion of the chest wall and right first and second ribs. Focal narrowing of the right subclavian vein just superior to the mass. Small satellite nodules in the right upper lobe measuring up the mm (series 3, image 54). Multiple nodules in the left upper lobe measuring up to 2.2 cm (series 3, image 68). 9 mm nodule in the left lower lobe (series 3, image 100). Progressive basilar predominant  peripheral subpleural reticulation and honeycombing throughout both lungs. No focal consolidation, pleural effusion, or pneumothorax. Musculoskeletal: Direct invasion of the right first and second ribs as above. Severe T1 compression fracture. CT ABDOMEN PELVIS FINDINGS Hepatobiliary: No focal liver abnormality is seen. No gallstones, gallbladder wall thickening, or biliary dilatation. Pancreas: Unremarkable. No pancreatic ductal dilatation or surrounding inflammatory changes. Spleen: Normal in size without focal abnormality. Adrenals/Urinary Tract: The adrenal glands are unremarkable. Unchanged small bilateral renal cysts. No renal calculi or hydronephrosis. Mild circumferential bladder wall thickening. Stomach/Bowel: Progressive circumferential wall thickening cecum with enlarging mass anteriorly, currently measuring 3.9 x 3.7 cm, previously 2.9 x 3.1 cm. Surrounding inflammatory changes have resolved. Diffuse colonic diverticulosis. The stomach and small bowel are unremarkable. Vascular/Lymphatic: Aortic atherosclerosis. No enlarged abdominal or pelvic lymph nodes. Reproductive: Status post hysterectomy. No adnexal masses. Other: No free fluid or pneumoperitoneum. Unchanged small area of fat necrosis near the upper pole the right kidney. Musculoskeletal: No acute or significant osseous findings. IMPRESSION: Chest: 1. Large 5.7 x 5.6 cm mass in the anterior right upper lobe with invasion of the chest wall and right first and second ribs, consistent with primary bronchogenic carcinoma. 2. Ipsi- and contralateral nodal and pulmonary metastatic disease. Conglomerate mediastinal lymphadenopathy significantly narrows the SVC. 3. Severe T1 compression fracture, possibly pathologic. 4. UIP-type interstitial lung disease, progressed since 2014. 5. Aortic Atherosclerosis (ICD10-I70.0). Abdomen and pelvis: 1. Enlarging mass associated with the anterior cecum, currently measuring 3.9 x 3.7 cm, previously 2.9 x 3.1 cm.  Surrounding inflammatory changes have resolved. Findings are concerning for primary colon cancer. Electronically Signed   By: WTitus DubinM.D.   On: 01/10/2020 15:45   CT ABDOMEN PELVIS W CONTRAST  Result Date: 12/15/2019 CLINICAL DATA:  Follow-up for diverticulitis. EXAM: CT ABDOMEN AND PELVIS WITH CONTRAST TECHNIQUE: Multidetector CT imaging  of the abdomen and pelvis was performed using the standard protocol following bolus administration of intravenous contrast. CONTRAST:  40m OMNIPAQUE IOHEXOL 300 MG/ML  SOLN COMPARISON:  12/12/2019 FINDINGS: Lower chest: Lung base fibrosis, stable.  No acute findings. Hepatobiliary: No focal liver abnormality is seen. No gallstones, gallbladder wall thickening, or biliary dilatation. Pancreas: Unremarkable. No pancreatic ductal dilatation or surrounding inflammatory changes. Spleen: Normal in size without focal abnormality. Adrenals/Urinary Tract: No adrenal mass. Kidneys normal in size, orientation and position with symmetric enhancement and excretion. 1.8 cm upper pole left renal cyst. 7 mm anterior midpole right renal cyst. No other masses, no stones and no hydronephrosis. Normal ureters. Normal bladder. Fat containing area in the perinephric fat adjacent to the superolateral right kidney, likely an area of fat necrosis, unchanged. Stomach/Bowel: Poorly defined masslike abnormality contiguous with the anterior wall of the cecum, consistent with a poorly defined abscess or phlegmon, currently measuring 3.1 x 2.9 x 3.7 cm, increased from 2.6 x 2.3 x 3.2 cm on the prior exam. There is thickening of the wall of the cecum which is similar to the prior study. The right transverse colon abuts the area of inflammation arising from the anterior cecum. There are numerous colonic diverticula which predominate on the left. No other evidence of diverticulitis. No other areas of wall thickening or inflammation. Stomach and small bowel are unremarkable. Vascular/Lymphatic: Aortic  atherosclerosis. No enlarged lymph nodes. Reproductive: Status post hysterectomy. No adnexal masses. Other: No ascites. There is subcutaneous soft tissue air the lower, new from the prior exam. Anterior abdominal wall Musculoskeletal: No acute fracture. No osteoblastic or osteolytic lesions. IMPRESSION: 1. The poorly defined collection or phlegmon, contiguous with the anterior wall of the cecum, has mildly increased in size, but remains poorly defined, and may not be drainable. There are surrounding inflammatory changes and more diffuse wall thickening of the cecum, which is similar to the prior study. 2. No other acute abnormality within the abdomen or pelvis. No free air. No ascites. Electronically Signed   By: DLajean ManesM.D.   On: 12/15/2019 13:27   DG Chest Port 1 View  Result Date: 01/10/2020 CLINICAL DATA:  Cough, sepsis EXAM: PORTABLE CHEST 1 VIEW COMPARISON:  Multiple prior studies most recent chest x-ray from 12/12/2019 FINDINGS: Findings in the RIGHT upper chest with increasing confluent density in this area measuring 8.7 x 5.7 cm. Nodule in the LEFT chest at 1.4 cm. Apical scarring. Diffuse increased interstitial markings superimposed on pulmonary emphysema. Signs of atherosclerosis. No evidence of pleural effusion. Cardiomediastinal contours are normal. There is however fullness of the RIGHT hilum Absence of rib shadows of anterior ribs. No additional bony abnormality on limited assessment. IMPRESSION: 1. Suspected mass in the RIGHT upper lobe with anterior rib destruction and associated LEFT lower lobe nodule. CT of the chest is recommended for further evaluation. 2. Fullness of the RIGHT hilum raising the question of RIGHT hilar adenopathy. 3. Diffused increased interstitial markings could represent chronic lung disease, pneumonitis or lymphangitic tumor is also considered. These results were called by telephone at the time of interpretation on 01/10/2020 at 2:27 pm [facility]  who verbally acknowledged these results. Electronically Signed   By: GZetta BillsM.D.   On: 01/10/2020 14:24   VAS [place]PPER EXTREMITY VENOUS DUPLEX  Result Date: 01/11/2020 UPPER VENOUS STUDY  Indications: Pain, Swelling, and Erythema Other Indications: Cellulitis with sepsis. Comparison Study: Prior study from 09/06/2017 is available for comparison Performing Technologist: CSharion DoveRVS  Examination Guidelines: A complete evaluation includes B-mode imaging, spectral Doppler, color Doppler, and power Doppler as needed of all accessible portions of each vessel. Bilateral testing is considered an integral part of a complete examination. Limited examinations for reoccurring indications may be performed as noted.  Right Findings: +----------+------------+---------+-----------+----------+-------+  RIGHT      Compressible Phasicity Spontaneous Properties Summary  +----------+------------+---------+-----------+----------+-------+  IJV            Full        Yes        Yes                         +----------+------------+---------+-----------+----------+-------+  Subclavian     Full        Yes        Yes                         +----------+------------+---------+-----------+----------+-------+  Axillary       Full        Yes        Yes                         +----------+------------+---------+-----------+----------+-------+  Brachial       Full        Yes        Yes                         +----------+------------+---------+-----------+----------+-------+  Radial         Full                                               +----------+------------+---------+-----------+----------+-------+  Ulnar          Full                                               +----------+------------+---------+-----------+----------+-------+  Cephalic       Full                                               +----------+------------+---------+-----------+----------+-------+  Basilic        Full                                                +----------+------------+---------+-----------+----------+-------+  Left Findings: +----------+------------+---------+-----------+----------+-------+  LEFT       Compressible Phasicity Spontaneous Properties Summary  +----------+------------+---------+-----------+----------+-------+  Subclavian     Full        Yes        Yes                         +----------+------------+---------+-----------+----------+-------+  Summary:  Right: No evidence of deep vein thrombosis in the upper extremity. No evidence of superficial vein thrombosis in the upper extremity.  Left: No evidence of thrombosis in the subclavian.  *See table(s)  above for measurements and observations.  Diagnosing physician: Monica Martinez MD Electronically signed by Monica Martinez MD on 01/11/2020 at 11:15:47 AM.    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  Biopsies pending from yesterday.  Patient has large right upper chest mass with chest wall invasion.  The superior vena cava is significantly narrowed from this tumor. 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  She would be a good candidate for urgent treatment directed at the mediastinum and right upper chest mass.  Today, I talked to the patient  about the findings and work-up thus far.  We discussed the natural history of lung and general treatment, highlighting the role of radiotherapy in the management.  We discussed the available radiation techniques, and focused on the details of logistics and delivery.  We reviewed the anticipated acute and late sequelae associated with radiation in this setting.  The patient was encouraged to ask questions that I answered to the best of my ability.  A patient consent form was discussed and signed.  We retained a copy for our records.  The patient would like to proceed with radiation and will be scheduled for CT simulation later this morning  PLAN: CT simulation  with first treatment later today.  Final management details are pending results of biopsy from yesterday.  Anticipate 14 treatments directed at the right upper chest and mediastinal region    ------------------------------------------------  Blair Promise, PhD, MD  This document serves as a record of services personally performed by Gery Pray, MD. It was created on his behalf by Clerance Lav, a trained medical scribe. The creation of this record is based on the scribe's personal observations and the provider's statements to them. This document has been checked and approved by the attending provider.

## 2020-01-13 NOTE — Care Management Important Message (Signed)
Important Message  Patient Details IM Letter given to [name] Name: [name] MRN: [id number] Date of Birth: [birth year]   Medicare Important Message Given:  Yes     Kerin Salen 01/13/2020, 10:19 AM

## 2020-01-13 NOTE — Progress Notes (Signed)
PROGRESS NOTE    [name]  VFI:[id number] DOB: 1941/04/12 DOA: 01/10/2020 PCP: Rosita Fire, MD   Brief Narrative:  79 year old with history of DM2, HLD, breast cancer status postmastectomy, HTN, COPD, tobacco use, recent diverticulitis and cecal perforation treated with antibiotic discharged to SNF presented back to the hospital with complains of worsening abdominal pain, found to have new right upper lobe lung mass, extensive adenopathy with concerning SVC syndrome.  Patient was transferred to [facility] for radiation treatment.  Cultures ended up growing Pasteurella.   Assessment & Plan:   Active Problems:   Infiltrating ductal carcinoma of breast (HCC)   Type 2 diabetes mellitus with hyperlipidemia (HCC)   Benign essential HTN   Hyperlipidemia   Sepsis ([place])   Cecal diverticulitis   Mass of upper lobe of right lung   Cellulitis of right upper arm   Sepsis secondary to Pasteurella bacteremia Recent cecal perforation secondary to diverticulitis UTI- UCx= Kluyvera Ascorbata -Culture data reviewed, sepsis physiology improving -Transition IV Zosyn to Unasyn. -CT reports reviewed.  She will need outpatient colonoscopy in the near future-6 to 8 weeks.  Right upper lobe mass with extensive mediastinal adenopathy and early SVC syndrome Right arm swelling secondary to SVC syndrome -Status post CT-guided biopsy of right upper lung mass 6/14 -Awaiting pathology report -Status post radiation treatment 6/15  History of breast cancer -Previously treated with mastectomy in 2010, also was on p.o. medications for 5 years.  Followed Dr. Jamey Reas.  Oncology team in the hospital has been notified  History of COPD with tobacco use -Bronchodilators as needed, incentive spirometer and flutter valve  Diabetes mellitus type 2 -Insulin sliding scale and Accu-Chek  DVT prophylaxis:  Code Status: DNR Family Communication:  Spoke with the Grandson   Status is:  Inpatient  Remains inpatient appropriate because:IV treatments appropriate due to intensity of illness or inability to take PO   Dispo: The patient is from: SNF              Anticipated d/c is to: SNF              Anticipated d/c date is: > 3 days              Patient currently is not medically stable to d/c. On going treatment for her SVC syndrome and Bactremia.    Subjective: Feels ok, has some dizziness but no other complaints.   Review of Systems Otherwise negative except as per HPI, including: General: Denies fever, chills, night sweats or unintended weight loss. Resp: Denies cough, wheezing, shortness of breath. Cardiac: Denies chest pain, palpitations, orthopnea, paroxysmal nocturnal dyspnea. GI: Denies abdominal pain, nausea, vomiting, diarrhea or constipation GU: Denies dysuria, frequency, hesitancy or incontinence MS: Denies muscle aches, joint pain or swelling Neuro: Denies headache, neurologic deficits (focal weakness, numbness, tingling), abnormal gait Psych: Denies anxiety, depression, SI/HI/AVH Skin: Denies new rashes or lesions ID: Denies sick contacts, exotic exposures, travel  Examination:  General exam: Appears calm and comfortable ; 2L [place] Respiratory system: b/l corase BS at the bases. Cardiovascular system: S1 & S2 heard, RRR. No JVD, murmurs, rubs, gallops or clicks. No pedal edema. Gastrointestinal system: Abdomen is nondistended, soft and nontender. No organomegaly or masses felt. Normal bowel sounds heard. Central nervous system: Alert and oriented. No focal neurological deficits. Extremities: Symmetric 5 x 5 power. Skin: No rashes, lesions or ulcers Psychiatry: Judgement and insight appear normal. Mood & affect appropriate.     Objective: Vitals:   01/12/20  1422 01/13/20 0008 01/13/20 0432 01/13/20 0709  BP: (!) 142/62 126/68 131/62 135/69  Pulse: 72 79 68 67  Resp: 20 20 20 19   Temp: 97.7 F (36.5 C) 98.4 F (36.9 C) 97.6 F (36.4 C) 97.7 F  (36.5 C)  TempSrc:   Oral Oral  SpO2: 100% 100% 99% 98%  Weight:      Height:       No intake or output data in the 24 hours ending 01/13/20 1036 Filed Weights   01/10/20 1303  Weight: 63 kg     Data Reviewed:   CBC: Recent Labs  Lab 01/08/20 1357 01/10/20 1315 01/12/20 0458  WBC 8.7 19.0* 6.9  NEUTROABS 6.6 17.4*  --   HGB 12.0 12.2 9.9*  HCT 38.3 38.1 31.3*  MCV 95.5 93.8 95.7  PLT 210 177 119*   Basic Metabolic Panel: Recent Labs  Lab 01/08/20 1357 01/10/20 1315 01/12/20 0458  NA 131* 132* 139  K 4.6 4.1 4.0  CL 97* 97* 107  CO2 23 25 24   GLUCOSE 110* 156* 137*  BUN 9 10 5*  CREATININE 0.76 0.84 0.64  CALCIUM 9.8 9.1 8.4*   GFR: Estimated Creatinine Clearance: 50.5 mL/min (by C-G formula based on SCr of 0.64 mg/dL). Liver Function Tests: Recent Labs  Lab 01/08/20 1357 01/10/20 1315 01/12/20 0458  AST 18 13* 12*  ALT 10 9 8   ALKPHOS 41 43 32*  BILITOT 0.3 0.9 0.9  PROT 6.8 7.0 5.4*  ALBUMIN 3.5 3.4* 2.3*   No results for input(s): LIPASE, AMYLASE in the last 168 hours. No results for input(s): AMMONIA in the last 168 hours. Coagulation Profile: Recent Labs  Lab 01/10/20 1315  INR 1.0   Cardiac Enzymes: No results for input(s): CKTOTAL, CKMB, CKMBINDEX, TROPONINI in the last 168 hours. BNP (last 3 results) No results for input(s): PROBNP in the last 8760 hours. HbA1C: No results for input(s): HGBA1C in the last 72 hours. CBG: Recent Labs  Lab 01/12/20 1131 01/12/20 1757 01/12/20 1852 01/12/20 2140 01/13/20 0804  GLUCAP 105* 206* 237* 163* 100*   Lipid Profile: No results for input(s): CHOL, HDL, LDLCALC, TRIG, CHOLHDL, LDLDIRECT in the last 72 hours. Thyroid Function Tests: No results for input(s): TSH, T4TOTAL, FREET4, T3FREE, THYROIDAB in the last 72 hours. Anemia Panel: No results for input(s): VITAMINB12, FOLATE, FERRITIN, TIBC, IRON, RETICCTPCT in the last 72 hours. Sepsis Labs: Recent Labs  Lab 01/10/20 1315   LATICACIDVEN 1.6    Recent Results (from the past 240 hour(s))  Blood Culture (routine x 2)     Status: Abnormal   Collection Time: 01/10/20  1:15 PM   Specimen: BLOOD  Result Value Ref Range Status   Specimen Description   Final    BLOOD LEFT ANTECUBITAL Performed at [facility], [place] [address] [place], [place] [address]    Special Requests   Final    BOTTLES DRAWN AEROBIC AND ANAEROBIC Blood Culture adequate volume Performed at [facility], [address], [place], [place] [address]    Culture  Setup Time   Final    GRAM NEGATIVE RODS Gram Stain Report Called to,Read Back By and Verified With: SAPPELT,J @ 1856 ON 01/11/20 BY JUW AEROBIC BOTTLE ONLY GS DONE @ [facility] GRAM NEGATIVE RODS ANAEROBIC BOTTLE ONLY Gram Stain Report Called to,Read Back By and Verified With: PREVIOUSLY CALLED @511  01/11/2020 KAY CRITICAL RESULT CALLED TO, READ BACK BY AND VERIFIED WITH: [facility] MIKE M 42 [id number] FCP    Culture (  A)  Final    PASTEURELLA MULTOCIDA Usually susceptible to penicillin and other beta lactam agents,quinolones,macrolides and tetracyclines. Performed at [facility], [place] [address] [place], [place] [address]    Report Status 01/13/2020 FINAL  Final  Blood Culture ID Panel (Reflexed)     Status: None   Collection Time: 01/10/20  1:15 PM  Result Value Ref Range Status   Enterococcus species NOT DETECTED NOT DETECTED Final   Listeria monocytogenes NOT DETECTED NOT DETECTED Final   Staphylococcus species NOT DETECTED NOT DETECTED Final   Staphylococcus aureus (BCID) NOT DETECTED NOT DETECTED Final   Streptococcus species NOT DETECTED NOT DETECTED Final   Streptococcus agalactiae NOT DETECTED NOT DETECTED Final   Streptococcus pneumoniae NOT DETECTED NOT DETECTED Final   Streptococcus pyogenes NOT DETECTED NOT DETECTED Final   Acinetobacter baumannii NOT DETECTED NOT DETECTED Final   Enterobacteriaceae species NOT DETECTED NOT DETECTED Final   Enterobacter cloacae  complex NOT DETECTED NOT DETECTED Final   Escherichia coli NOT DETECTED NOT DETECTED Final   Klebsiella oxytoca NOT DETECTED NOT DETECTED Final   Klebsiella pneumoniae NOT DETECTED NOT DETECTED Final   Proteus species NOT DETECTED NOT DETECTED Final   Serratia marcescens NOT DETECTED NOT DETECTED Final   Haemophilus influenzae NOT DETECTED NOT DETECTED Final   Neisseria meningitidis NOT DETECTED NOT DETECTED Final   Pseudomonas aeruginosa NOT DETECTED NOT DETECTED Final   Candida albicans NOT DETECTED NOT DETECTED Final   Candida glabrata NOT DETECTED NOT DETECTED Final   Candida krusei NOT DETECTED NOT DETECTED Final   Candida parapsilosis NOT DETECTED NOT DETECTED Final   Candida tropicalis NOT DETECTED NOT DETECTED Final    Comment: Performed at [facility], [place] [address] [place] [address], [place], [place] [address]  Blood Culture (routine x 2)     Status: None (Preliminary result)   Collection Time: 01/10/20  1:47 PM   Specimen: BLOOD LEFT ARM  Result Value Ref Range Status   Specimen Description BLOOD LEFT ARM BOTTLES DRAWN AEROBIC AND ANAEROBIC  Final   Special Requests Blood Culture adequate volume  Final   Culture   Final    NO GROWTH 3 DAYS Performed at [facility], [address] [place], [place] [address]    Report Status PENDING  Incomplete  Urine culture     Status: Abnormal   Collection Time: 01/10/20  2:00 PM   Specimen: Urine, Catheterized  Result Value Ref Range Status   Specimen Description   Final    URINE, CATHETERIZED Performed at [facility], [address], [place], [place] [address]    Special Requests   Final    NONE Performed at [facility], [address] [place], [place] [address]    Culture >=100,000 COLONIES/mL Kalman Jewels ASCORBATA (A)  Final   Report Status 01/13/2020 FINAL  Final   Organism ID, Bacteria KLUYVERA ASCORBATA (A)  Final      Susceptibility   Kluyvera ascorbata - MIC*    AMPICILLIN >=32 RESISTANT Resistant     CEFAZOLIN >=64 RESISTANT  Resistant     CEFTRIAXONE <=1 SENSITIVE Sensitive     CIPROFLOXACIN <=0.25 SENSITIVE Sensitive     GENTAMICIN <=1 SENSITIVE Sensitive     IMIPENEM <=0.25 SENSITIVE Sensitive     NITROFURANTOIN 64 INTERMEDIATE Intermediate     TRIMETH/SULFA <=20 SENSITIVE Sensitive     AMPICILLIN/SULBACTAM <=2 SENSITIVE Sensitive     * >=100,000 COLONIES/mL KLUYVERA ASCORBATA  SARS Coronavirus 2 by RT PCR (hospital order, performed in  [facility] hospital lab) Nasopharyngeal Nasopharyngeal Swab     Status: None   Collection Time: 01/10/20  2:00 PM   Specimen: Nasopharyngeal Swab  Result Value Ref Range Status   SARS Coronavirus 2 NEGATIVE NEGATIVE Final    Comment: (NOTE) SARS-CoV-2 target nucleic acids are NOT DETECTED.  The SARS-CoV-2 RNA is generally detectable in upper and lower respiratory specimens during the acute phase of infection. The lowest concentration of SARS-CoV-2 viral copies this assay can detect is 250 copies / mL. A negative result does not preclude SARS-CoV-2 infection and should not be used as the sole basis for treatment or other patient management decisions.  A negative result may occur with improper specimen collection / handling, submission of specimen other than nasopharyngeal swab, presence of viral mutation(s) within the areas targeted by this assay, and inadequate number of viral copies (<250 copies / mL). A negative result must be combined with clinical observations, patient history, and epidemiological information.  Fact Sheet for Patients:   StrictlyIdeas.no  Fact Sheet for Healthcare Providers: BankingDealers.co.za  This test is not yet approved or  cleared by the [place] FDA and has been authorized for detection and/or diagnosis of SARS-CoV-2 by FDA under an Emergency Use Authorization (EUA).  This EUA will remain in effect (meaning this test can be used) for the duration of the COVID-19 declaration under  Section 564(b)(1) of the Act, 21 U.S.C. section 360bbb-3(b)(1), unless the authorization is terminated or revoked sooner.  Performed at [facility], [address] [place], [place] [address]          Radiology Studies: CT guided needle placement  Result Date: 01/12/2020 CLINICAL DATA:  Anterior right upper lobe lung mass with rib destruction and chest wall invasion. History of breast carcinoma. EXAM: CT GUIDED CORE BIOPSY OF RIGHT UPPER LOBE LUNG MASS ANESTHESIA/SEDATION: 1.0 mg IV Versed; 25 mcg IV Fentanyl Total Moderate Sedation Time:  13 minutes. The patient's level of consciousness and physiologic status were continuously monitored during the procedure by Radiology nursing. PROCEDURE: The procedure risks, benefits, and alternatives were explained to the patient. Questions regarding the procedure were encouraged and answered. The patient understands and consents to the procedure. A time-out was performed prior to initiating the procedure. CT of the upper chest was performed in a supine position. The right anterior chest wall was prepped with chlorhexidine in a sterile fashion, and a sterile drape was applied covering the operative field. A sterile gown and sterile gloves were used for the procedure. Local anesthesia was provided with 1% Lidocaine. Under CT guidance, a 47 gauge trocar needle was advanced to the level of an anterior right upper lobe lung mass. After confirming needle tip position, 3 separate coaxial 18 gauge core biopsy samples were obtained and submitted in formalin. Additional CT was performed. COMPLICATIONS: None FINDINGS: Lobulated mass centered in the anterior aspect of the right upper lobe and invading the anterior chest wall again noted measuring approximately 6.3 cm in greatest diameter. Solid tissue was obtained from the lateral aspect of the mass. No pneumothorax on post biopsy imaging. IMPRESSION: CT-guided core biopsy performed at the level of the large right upper lobe  anterior mass invading the anterior chest wall. Electronically Signed   By: Aletta Edouard M.D.   On: 01/12/2020 17:04        Scheduled Meds: . dexamethasone (DECADRON) injection  4 mg Intravenous Q24H  . feeding supplement (GLUCERNA SHAKE)  237 mL Oral TID BM  . ferrous sulfate  325 mg  Oral Q breakfast  . gabapentin  600 mg Oral TID  . insulin aspart  0-15 Units Subcutaneous TID WC  . metoprolol tartrate  12.5 mg Oral BID  . mometasone-formoterol  2 puff Inhalation BID  . multivitamin with minerals  1 tablet Oral Daily  . pantoprazole  40 mg Oral Daily  . pravastatin  40 mg Oral q1800  . QUEtiapine  12.5 mg Oral QHS  . senna  1 tablet Oral BID   Continuous Infusions: . ampicillin-sulbactam (UNASYN) IV       LOS: 3 days   Time spent= 35 mins    Torian Quintero Arsenio Loader, MD [facility]  If 7PM-7AM, please contact night-coverage  01/13/2020, 10:36 AM

## 2020-01-13 NOTE — Progress Notes (Signed)
  Radiation Oncology         [phone] ________________________________  Name: [name] MRN: [id number]  Date: 01/13/2020  DOB: 09-Sep-1940  Simulation Verification Note IN-Patient    ICD-10-CM   1. Mass of upper lobe of right lung  R91.8     NARRATIVE: The patient was brought to the treatment unit and placed in the planned treatment position. The clinical setup was verified. Then port films were obtained and uploaded to the radiation oncology medical record software.  The treatment beams were carefully compared against the planned radiation fields. The position location and shape of the radiation fields was reviewed. They targeted volume of tissue appears to be appropriately covered by the radiation beams. Organs at risk appear to be excluded as planned.  Based on my personal review, I approved the simulation verification. The patient's treatment will proceed as planned.  -----------------------------------  Blair Promise, PhD, MD  This document serves as a record of services personally performed by Gery Pray, MD. It was created on his behalf by Clerance Lav, a trained medical scribe. The creation of this record is based on the scribe's personal observations and the provider's statements to them. This document has been checked and approved by the attending provider.

## 2020-01-13 NOTE — Progress Notes (Addendum)
Patient here today as a new consult with Dr. Sondra Come.  DIAGNOSIS: Newly found right upper lobe mass concerning for primary bronchogenic carcinoma versus recurrent breast cancer and/or colon cancer pending biopsy  Tobacco/Marijuana/Snuff/ETOH use: 75 years of 1/2 PPD  Past/Anticipated interventions by cardiothoracic surgery, if any: none  Past/Anticipated interventions by medical oncology, if DPO:EUMP hx of Chemotherapy.  Signs/Symptoms  Weight changes, if any: Reports wt. Loss of 15 lbs in less than a month.  Respiratory complaints, if any: on 2L n/c  Hemoptysis, if any: No  Pain issues, if any:  Right shoulder  SAFETY ISSUES:  Prior radiation? No  Pacemaker/ICD? No  Possible current pregnancy?Hysterectomy  Is the patient on methotrexate? No  Current Complaints / other details:  Reports pain in her right shoulder.  There were no vitals taken for this visit. Patient is currently admitted to  [facility] in room 1614.

## 2020-01-13 NOTE — Progress Notes (Signed)
PT Cancellation Note  Patient Details Name: TEQUISHA MAAHS MRN: [id number] DOB: Feb 13, 1941   Cancelled Treatment:    Reason Eval/Treat Not Completed: Patient at procedure or test/unavailable    Doreatha Massed, PT Acute Rehabilitation  Office: [phone] Pager: [phone]

## 2020-01-14 ENCOUNTER — Ambulatory Visit: Payer: Medicare HMO | Admitting: Radiation Oncology

## 2020-01-14 ENCOUNTER — Institutional Professional Consult (permissible substitution): Payer: Medicare HMO | Admitting: Radiation Oncology

## 2020-01-14 ENCOUNTER — Ambulatory Visit
Admit: 2020-01-14 | Discharge: 2020-01-14 | Disposition: A | Discharge status: Home / Self Care | Payer: Medicare HMO | Attending: Radiation Oncology | Admitting: Radiation Oncology

## 2020-01-14 LAB — BASIC METABOLIC PANEL
Anion gap: 7 (ref 5–15)
BUN: 9 mg/dL (ref 8–23)
CO2: 25 mmol/L (ref 22–32)
Calcium: 8.2 mg/dL — ABNORMAL LOW (ref 8.9–10.3)
Chloride: 107 mmol/L (ref 98–111)
Creatinine, Ser: 0.41 mg/dL — ABNORMAL LOW (ref 0.44–1.00)
GFR calc Af Amer: >60 mL/min (ref >60)
GFR calc non Af Amer: >60 mL/min (ref >60)
Glucose, Bld: 103 mg/dL — ABNORMAL HIGH (ref 70–99)
Potassium: 3.8 mmol/L (ref 3.5–5.1)
Sodium: 139 mmol/L (ref 135–145)

## 2020-01-14 LAB — GLUCOSE, CAPILLARY
Glucose-Capillary: 107 mg/dL — ABNORMAL HIGH (ref 70–99)
Glucose-Capillary: 145 mg/dL — ABNORMAL HIGH (ref 70–99)
Glucose-Capillary: 184 mg/dL — ABNORMAL HIGH (ref 70–99)
Glucose-Capillary: 214 mg/dL — ABNORMAL HIGH (ref 70–99)

## 2020-01-14 LAB — CBC
HCT: 32.1 % — ABNORMAL LOW (ref 36.0–46.0)
Hemoglobin: 10.2 g/dL — ABNORMAL LOW (ref 12.0–15.0)
MCH: 30.5 pg (ref 26.0–34.0)
MCHC: 31.8 g/dL (ref 30.0–36.0)
MCV: 96.1 fL (ref 80.0–100.0)
Platelets: 180 10*3/uL (ref 150–400)
RBC: 3.34 MIL/uL — ABNORMAL LOW (ref 3.87–5.11)
RDW: 16.2 % — ABNORMAL HIGH (ref 11.5–15.5)
WBC: 6.7 10*3/uL (ref 4.0–10.5)
nRBC: 0 % (ref 0.0–0.2)

## 2020-01-14 LAB — MAGNESIUM: Magnesium: 1.8 mg/dL (ref 1.7–2.4)

## 2020-01-14 NOTE — Progress Notes (Signed)
PROGRESS NOTE    [name]  WNU:[id number] DOB: 27-Nov-1940 DOA: 01/10/2020 PCP: Rosita Fire, MD   Brief Narrative:  79 year old with history of DM2, HLD, breast cancer status postmastectomy, HTN, COPD, tobacco use, recent diverticulitis and cecal perforation treated with antibiotic discharged to SNF presented back to the hospital with complains of worsening abdominal pain, found to have new right upper lobe lung mass, extensive adenopathy with concerning SVC syndrome.  Patient was transferred to [facility] for radiation treatment.  Cultures ended up growing Pasteurella.   Assessment & Plan:   Active Problems:   Infiltrating ductal carcinoma of breast (HCC)   Type 2 diabetes mellitus with hyperlipidemia (HCC)   Benign essential HTN   Hyperlipidemia   Sepsis ([place])   Cecal diverticulitis   Mass of upper lobe of right lung   Cellulitis of right upper arm   Sepsis secondary to Pasteurella bacteremia Recent cecal perforation secondary to diverticulitis UTI- UCx= Kluyvera Ascorbata -Culture data reviewed, sepsis physiology improving -Continue IV Unasyn -CT reports reviewed.  She will need outpatient colonoscopy in the near future-6 to 8 weeks.  Right upper lobe mass with extensive mediastinal adenopathy and early SVC syndrome Right arm swelling secondary to SVC syndrome -Status post CT-guided biopsy of right upper lung mass 6/14 -Path report-pending -First radiation treatment 6/15.  Total 14 plan tentatively.  History of breast cancer -Previously treated with mastectomy in 2010, also was on p.o. medications for 5 years.  Followed Dr. Jamey Reas.  Oncology team has been notified  History of COPD with tobacco use -Bronchodilators as needed, incentive spirometer and flutter valve  Diabetes mellitus type 2 -Insulin sliding scale and Accu-Chek  Generalized weakness -PT/OT  DVT prophylaxis:  Code Status: DNR Family Communication: None at bedside  Status is:  Inpatient  Remains inpatient appropriate because:IV treatments appropriate due to intensity of illness or inability to take PO   Dispo: The patient is from: SNF              Anticipated d/c is to: SNF              Anticipated d/c date is: > 3 days              Patient currently is not medically stable to d/c.  Maintain hospital stay for radiation treatment while awaiting pathology result.  In the meantime on IV antibiotics.  PT evaluation pending   Subjective: Feels okay no new complaints.  Occasional issues with imbalance.  Tolerating oral diet.  Review of Systems Otherwise negative except as per HPI, including: General: Denies fever, chills, night sweats or unintended weight loss. Resp: Denies cough, wheezing, shortness of breath. Cardiac: Denies chest pain, palpitations, orthopnea, paroxysmal nocturnal dyspnea. GI: Denies abdominal pain, nausea, vomiting, diarrhea or constipation GU: Denies dysuria, frequency, hesitancy or incontinence MS: Denies muscle aches, joint pain or swelling Neuro: Denies headache, neurologic deficits (focal weakness, numbness, tingling), abnormal gait Psych: Denies anxiety, depression, SI/HI/AVH Skin: Denies new rashes or lesions ID: Denies sick contacts, exotic exposures, travel  Examination:  Constitutional: Not in acute distress Respiratory: Clear to auscultation bilaterally Cardiovascular: Normal sinus rhythm, no rubs Abdomen: Nontender nondistended good bowel sounds Musculoskeletal: No edema noted.  Right upper extremity slightly more swollen Skin: No rashes seen Neurologic: CN 2-12 grossly intact.  And nonfocal Psychiatric: Normal judgment and insight. Alert and oriented x 3. Normal mood.  Objective: Vitals:   01/13/20 1332 01/13/20 2105 01/14/20 0613 01/14/20 0818  BP: 135/64 (!) 138/59 (!) 135/93  Pulse: 75 72 67 64  Resp: 16 16 18 16   Temp: 97.9 F (36.6 C) 97.6 F (36.4 C) (!) 97.5 F (36.4 C)   TempSrc: Oral Oral Oral   SpO2:  99% 97% 98% 97%  Weight:      Height:        Intake/Output Summary (Last 24 hours) at 01/14/2020 1057 Last data filed at 01/14/2020 0258 Gross per 24 hour  Intake 240 ml  Output 1000 ml  Net -760 ml   Filed Weights   01/10/20 1303  Weight: 63 kg     Data Reviewed:   CBC: Recent Labs  Lab 01/08/20 1357 01/10/20 1315 01/12/20 0458 01/14/20 0617  WBC 8.7 19.0* 6.9 6.7  NEUTROABS 6.6 17.4*  --   --   HGB 12.0 12.2 9.9* 10.2*  HCT 38.3 38.1 31.3* 32.1*  MCV 95.5 93.8 95.7 96.1  PLT 210 177 148* 527   Basic Metabolic Panel: Recent Labs  Lab 01/08/20 1357 01/10/20 1315 01/12/20 0458 01/14/20 0617  NA 131* 132* 139 139  K 4.6 4.1 4.0 3.8  CL 97* 97* 107 107  CO2 23 25 24 25   GLUCOSE 110* 156* 137* 103*  BUN 9 10 5* 9  CREATININE 0.76 0.84 0.64 0.41*  CALCIUM 9.8 9.1 8.4* 8.2*  MG  --   --   --  1.8   GFR: Estimated Creatinine Clearance: 50.5 mL/min (A) (by C-G formula based on SCr of 0.41 mg/dL (L)). Liver Function Tests: Recent Labs  Lab 01/08/20 1357 01/10/20 1315 01/12/20 0458  AST 18 13* 12*  ALT 10 9 8   ALKPHOS 41 43 32*  BILITOT 0.3 0.9 0.9  PROT 6.8 7.0 5.4*  ALBUMIN 3.5 3.4* 2.3*   No results for input(s): LIPASE, AMYLASE in the last 168 hours. No results for input(s): AMMONIA in the last 168 hours. Coagulation Profile: Recent Labs  Lab 01/10/20 1315  INR 1.0   Cardiac Enzymes: No results for input(s): CKTOTAL, CKMB, CKMBINDEX, TROPONINI in the last 168 hours. BNP (last 3 results) No results for input(s): PROBNP in the last 8760 hours. HbA1C: No results for input(s): HGBA1C in the last 72 hours. CBG: Recent Labs  Lab 01/13/20 0804 01/13/20 1156 01/13/20 1727 01/13/20 2107 01/14/20 0731  GLUCAP 100* 127* 154* 157* 107*   Lipid Profile: No results for input(s): CHOL, HDL, LDLCALC, TRIG, CHOLHDL, LDLDIRECT in the last 72 hours. Thyroid Function Tests: No results for input(s): TSH, T4TOTAL, FREET4, T3FREE, THYROIDAB in the last 72  hours. Anemia Panel: No results for input(s): VITAMINB12, FOLATE, FERRITIN, TIBC, IRON, RETICCTPCT in the last 72 hours. Sepsis Labs: Recent Labs  Lab 01/10/20 1315  LATICACIDVEN 1.6    Recent Results (from the past 240 hour(s))  Blood Culture (routine x 2)     Status: Abnormal   Collection Time: 01/10/20  1:15 PM   Specimen: BLOOD  Result Value Ref Range Status   Specimen Description   Final    BLOOD LEFT ANTECUBITAL Performed at [facility], [place] [address] [place], [place] [address]    Special Requests   Final    BOTTLES DRAWN AEROBIC AND ANAEROBIC Blood Culture adequate volume Performed at [facility], [address] [place], [place] [address]    Culture  Setup Time   Final    GRAM NEGATIVE RODS Gram Stain Report Called to,Read Back By and Verified With: SAPPELT,J @ 4431 ON 01/11/20 BY JUW AEROBIC BOTTLE ONLY GS DONE @ [facility] GRAM NEGATIVE RODS  ANAEROBIC BOTTLE ONLY Gram Stain Report Called to,Read Back By and Verified With: PREVIOUSLY CALLED @511  01/11/2020 KAY CRITICAL RESULT CALLED TO, READ BACK BY AND VERIFIED WITH: PHARMD MIKE M [phone] [id number] FCP    Culture (A)  Final    PASTEURELLA MULTOCIDA Usually susceptible to penicillin and other beta lactam agents,quinolones,macrolides and tetracyclines. Performed at [facility], [place] [address], [place], [place] [address]    Report Status 01/13/2020 FINAL  Final  Blood Culture ID Panel (Reflexed)     Status: None   Collection Time: 01/10/20  1:15 PM  Result Value Ref Range Status   Enterococcus species NOT DETECTED NOT DETECTED Final   Listeria monocytogenes NOT DETECTED NOT DETECTED Final   Staphylococcus species NOT DETECTED NOT DETECTED Final   Staphylococcus aureus (BCID) NOT DETECTED NOT DETECTED Final   Streptococcus species NOT DETECTED NOT DETECTED Final   Streptococcus agalactiae NOT DETECTED NOT DETECTED Final   Streptococcus pneumoniae NOT DETECTED NOT DETECTED Final   Streptococcus pyogenes NOT  DETECTED NOT DETECTED Final   Acinetobacter baumannii NOT DETECTED NOT DETECTED Final   Enterobacteriaceae species NOT DETECTED NOT DETECTED Final   Enterobacter cloacae complex NOT DETECTED NOT DETECTED Final   Escherichia coli NOT DETECTED NOT DETECTED Final   Klebsiella oxytoca NOT DETECTED NOT DETECTED Final   Klebsiella pneumoniae NOT DETECTED NOT DETECTED Final   Proteus species NOT DETECTED NOT DETECTED Final   Serratia marcescens NOT DETECTED NOT DETECTED Final   Haemophilus influenzae NOT DETECTED NOT DETECTED Final   Neisseria meningitidis NOT DETECTED NOT DETECTED Final   Pseudomonas aeruginosa NOT DETECTED NOT DETECTED Final   Candida albicans NOT DETECTED NOT DETECTED Final   Candida glabrata NOT DETECTED NOT DETECTED Final   Candida krusei NOT DETECTED NOT DETECTED Final   Candida parapsilosis NOT DETECTED NOT DETECTED Final   Candida tropicalis NOT DETECTED NOT DETECTED Final    Comment: Performed at [facility], [place] [address] [place], [place] [address]  Blood Culture (routine x 2)     Status: None (Preliminary result)   Collection Time: 01/10/20  1:47 PM   Specimen: BLOOD LEFT ARM  Result Value Ref Range Status   Specimen Description BLOOD LEFT ARM BOTTLES DRAWN AEROBIC AND ANAEROBIC  Final   Special Requests Blood Culture adequate volume  Final   Culture   Final    NO GROWTH 4 DAYS Performed at [facility], [address] [place], [place] [address]    Report Status PENDING  Incomplete  Urine culture     Status: Abnormal   Collection Time: 01/10/20  2:00 PM   Specimen: Urine, Catheterized  Result Value Ref Range Status   Specimen Description   Final    URINE, CATHETERIZED Performed at [facility], [address], [place], [place] [address]    Special Requests   Final    NONE Performed at [facility], [address], [place], [place] [address]    Culture >=100,000 COLONIES/mL Humberto Seals (A)  Final   Report Status 01/13/2020 FINAL  Final    Organism ID, Bacteria KLUYVERA ASCORBATA (A)  Final      Susceptibility   Kluyvera ascorbata - MIC*    AMPICILLIN >=32 RESISTANT Resistant     CEFAZOLIN >=64 RESISTANT Resistant     CEFTRIAXONE <=1 SENSITIVE Sensitive     CIPROFLOXACIN <=0.25 SENSITIVE Sensitive     GENTAMICIN <=1 SENSITIVE Sensitive     IMIPENEM <=0.25 SENSITIVE Sensitive     NITROFURANTOIN 64  INTERMEDIATE Intermediate     TRIMETH/SULFA <=20 SENSITIVE Sensitive     AMPICILLIN/SULBACTAM <=2 SENSITIVE Sensitive     * >=100,000 COLONIES/mL KLUYVERA ASCORBATA  SARS Coronavirus 2 by RT PCR (hospital order, performed in [facility] hospital lab) Nasopharyngeal Nasopharyngeal Swab     Status: None   Collection Time: 01/10/20  2:00 PM   Specimen: Nasopharyngeal Swab  Result Value Ref Range Status   SARS Coronavirus 2 NEGATIVE NEGATIVE Final    Comment: (NOTE) SARS-CoV-2 target nucleic acids are NOT DETECTED.  The SARS-CoV-2 RNA is generally detectable in upper and lower respiratory specimens during the acute phase of infection. The lowest concentration of SARS-CoV-2 viral copies this assay can detect is 250 copies / mL. A negative result does not preclude SARS-CoV-2 infection and should not be used as the sole basis for treatment or other patient management decisions.  A negative result may occur with improper specimen collection / handling, submission of specimen other than nasopharyngeal swab, presence of viral mutation(s) within the areas targeted by this assay, and inadequate number of viral copies (<250 copies / mL). A negative result must be combined with clinical observations, patient history, and epidemiological information.  Fact Sheet for Patients:   StrictlyIdeas.no  Fact Sheet for Healthcare Providers: BankingDealers.co.za  This test is not yet approved or  cleared by the [place] FDA and has been authorized for detection and/or diagnosis of SARS-CoV-2  by FDA under an Emergency Use Authorization (EUA).  This EUA will remain in effect (meaning this test can be used) for the duration of the COVID-19 declaration under Section 564(b)(1) of the Act, 21 U.S.C. section 360bbb-3(b)(1), unless the authorization is terminated or revoked sooner.  Performed at [facility], [address], [place],  [address]          Radiology Studies: CT guided needle placement  Result Date: 01/12/2020 CLINICAL DATA:  Anterior right upper lobe lung mass with rib destruction and chest wall invasion. History of breast carcinoma. EXAM: CT GUIDED CORE BIOPSY OF RIGHT UPPER LOBE LUNG MASS ANESTHESIA/SEDATION: 1.0 mg IV Versed; 25 mcg IV Fentanyl Total Moderate Sedation Time:  13 minutes. The patient's level of consciousness and physiologic status were continuously monitored during the procedure by Radiology nursing. PROCEDURE: The procedure risks, benefits, and alternatives were explained to the patient. Questions regarding the procedure were encouraged and answered. The patient understands and consents to the procedure. A time-out was performed prior to initiating the procedure. CT of the upper chest was performed in a supine position. The right anterior chest wall was prepped with chlorhexidine in a sterile fashion, and a sterile drape was applied covering the operative field. A sterile gown and sterile gloves were used for the procedure. Local anesthesia was provided with 1% Lidocaine. Under CT guidance, a 92 gauge trocar needle was advanced to the level of an anterior right upper lobe lung mass. After confirming needle tip position, 3 separate coaxial 18 gauge core biopsy samples were obtained and submitted in formalin. Additional CT was performed. COMPLICATIONS: None FINDINGS: Lobulated mass centered in the anterior aspect of the right upper lobe and invading the anterior chest wall again noted measuring approximately 6.3 cm in greatest diameter. Solid tissue was  obtained from the lateral aspect of the mass. No pneumothorax on post biopsy imaging. IMPRESSION: CT-guided core biopsy performed at the level of the large right upper lobe anterior mass invading the anterior chest wall. Electronically Signed   By: Aletta Edouard M.D.   On: 01/12/2020  17:04        Scheduled Meds: . dexamethasone (DECADRON) injection  4 mg Intravenous Q24H  . feeding supplement (GLUCERNA SHAKE)  237 mL Oral TID BM  . ferrous sulfate  325 mg Oral Q breakfast  . gabapentin  600 mg Oral TID  . insulin aspart  0-15 Units Subcutaneous TID WC  . metoprolol tartrate  12.5 mg Oral BID  . mometasone-formoterol  2 puff Inhalation BID  . multivitamin with minerals  1 tablet Oral Daily  . pantoprazole  40 mg Oral Daily  . pravastatin  40 mg Oral q1800  . QUEtiapine  12.5 mg Oral QHS  . senna  1 tablet Oral BID   Continuous Infusions: . ampicillin-sulbactam (UNASYN) IV 3 g (01/14/20 0547)     LOS: 4 days   Time spent= 35 mins    Loribeth Katich Arsenio Loader, MD [facility]  If 7PM-7AM, please contact night-coverage  01/14/2020, 10:57 AM

## 2020-01-14 NOTE — Evaluation (Signed)
Occupational Therapy Evaluation Patient Details Name: [name] MRN: [id number] DOB: [birth year] Today's Date: 01/14/2020    History of Present Illness 79 year old with history of DM2, HLD, breast cancer status postmastectomy, HTN, COPD, tobacco use, recent diverticulitis and cecal perforation treated with antibiotic discharged to SNF presented back to the hospital with complains of worsening abdominal pain, found to have new right upper lobe lung mass, extensive adenopathy with concerning SVC syndrome.  Patient was transferred to [facility] for radiation treatment.  Cultures ended up growing Pasteurella.   Clinical Impression   [name] is an 79 year old woman with complicated medical history who is currently in hospital receiving radiation treatments. Patient discharged from short term rehab and home for a short time before being rehospitalized. On evaluation patient presents with functional strength, impaired balance, visual deficits and short term memory deficits requiring min assist for ADLs and ambulation for safety. 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 Patient reports baseline visual deficits and stopping cooking because she can't remember to turn the stove off. Patient will benefit from OT services to improve deficits and maintain functional abilities in order to return home at discharge. Therapist discussed with patient the need for further supervision at home for safety. Patient resistant to asking daughters for assistance reporting a concern of burdening family members. Highly recommend more assistance from family members at discharge - may need return to SNF if not possible.      Follow Up Recommendations  Home health OT;Supervision/Assistance - 24 hour    Equipment Recommendations  None recommended by OT    Recommendations for Other Services       Precautions / Restrictions  Precautions Precautions: Fall Restrictions Weight Bearing Restrictions: No      Mobility Bed Mobility               General bed mobility comments: Hand hold to transfer to side of bed.  Transfers                 General transfer comment: Min guard / min assist guiding RW while ambulating in hall. O2 sats 89% on RA with ambulating. Recovered quickly.    Balance Overall balance assessment: Mild deficits observed, not formally tested                                         ADL either performed or assessed with clinical judgement   ADL Overall ADL's : Needs assistance/impaired Eating/Feeding: Independent   Grooming: Standing;Supervision/safety;Wash/dry Nurse, mental health Details (indicate cue type and reason): Patient stood at sink to wash her hands. Upper Body Bathing: Set up;Sitting   Lower Body Bathing: Set up;Sit to/from stand   Upper Body Dressing : Set up;Sitting   Lower Body Dressing: Set up;Sit to/from stand   Toilet Transfer: Forensic scientist- Water quality scientist and Hygiene: Min guard;Sit to/from stand Toileting - Clothing Manipulation Details (indicate cue type and reason): Perfomred toileting with min guard. Tub/ Shower Transfer: Min guard;Shower seat   Functional mobility during ADLs: Min guard;Rolling walker General ADL Comments: min guard with RW. Reports hx of poor balance and running into things.     Vision Patient Visual Report: Peripheral vision impairment (reports left sided field cut.) Additional Comments: Reports visual deficits including double vision and "tunnel vision" peripheral vision loss on left  side approx a year ago. Reports hx of stroke.     Perception     Praxis      Pertinent Vitals/Pain Pain Assessment: No/denies pain     Hand Dominance Right   Extremity/Trunk Assessment Upper Extremity Assessment Upper Extremity Assessment: Overall WFL for tasks assessed   Lower Extremity  Assessment Lower Extremity Assessment: Defer to PT evaluation   Cervical / Trunk Assessment Cervical / Trunk Assessment: Normal   Communication Communication Communication: No difficulties   Cognition Arousal/Alertness: Awake/alert Behavior During Therapy: WFL for tasks assessed/performed Overall Cognitive Status: Within Functional Limits for tasks assessed                                 General Comments: Appears to have some mild short term memory deficits.   General Comments       Exercises     Shoulder Instructions      Home Living Family/patient expects to be discharged to:: Private residence Living Arrangements: Children (son who is "mentally disabled") Available Help at Discharge: Family;Available PRN/intermittently Type of Home: Mobile home Home Access: Stairs to enter Entrance Stairs-Number of Steps: 3 Entrance Stairs-Rails: Right;Left;Can reach both Home Layout: One level     Bathroom Shower/Tub: Occupational psychologist: Handicapped height Bathroom Accessibility: Yes   Home Equipment: Cane - single point;Wheelchair - [employer] - 2 wheels   Additional Comments: Reports recent discharge from rehab with  very little time at home.      Prior Functioning/Environment Level of Independence: Independent with assistive device(s)        Comments: using RW in home. just returned home from rehab.        OT Problem List: Decreased activity tolerance;Impaired balance (sitting and/or standing);Decreased safety awareness;Impaired vision/perception      OT Treatment/Interventions: Self-care/ADL training;DME and/or AE instruction;Therapeutic activities;Balance training;Patient/family education;Visual/perceptual remediation/compensation    OT Goals(Current goals can be found in the care plan section) Acute Rehab OT Goals Patient Stated Goal: to go home and not need assistance OT Goal Formulation: With patient Time For Goal Achievement:  01/28/20 Potential to Achieve Goals: Fair  OT Frequency: Min 2X/week   Barriers to D/C: Decreased caregiver support  Patient takes care of her mentally disabled son. Needs more support at home.       Co-evaluation PT/OT/SLP Co-Evaluation/Treatment: Yes Reason for Co-Treatment: To address functional/ADL transfers PT goals addressed during session: Mobility/safety with mobility OT goals addressed during session: ADL's and self-care      AM-PAC OT "6 Clicks" Daily Activity     Outcome Measure Help from another person eating meals?: None Help from another person taking care of personal grooming?: A Little Help from another person toileting, which includes using toliet, bedpan, or urinal?: A Little Help from another person bathing (including washing, rinsing, drying)?: A Little Help from another person to put on and taking off regular upper body clothing?: A Little Help from another person to put on and taking off regular lower body clothing?: A Little 6 Click Score: 19   End of Session Equipment Utilized During Treatment: Gait belt;Rolling walker Nurse Communication: Mobility status  Activity Tolerance: Patient tolerated treatment well Patient left: in chair;with call bell/phone within reach;with chair alarm set  OT Visit Diagnosis: Unsteadiness on feet (R26.81)                Time: 1761-6073 OT Time Calculation (min): 31 min Charges:  OT General Charges $  OT Visit: 1 Visit OT Evaluation $OT Eval Low Complexity: 1 Low  Eugenio Dollins, OTR/L [place]  Office [phone] Pager: [place] 01/14/2020, 1:37 PM

## 2020-01-14 NOTE — Evaluation (Signed)
Physical Therapy Evaluation Patient Details Name: [name] MRN: [id number] DOB: [birth year] Today's Date: 01/14/2020   History of Present Illness  79 year old with history of DM2, HLD, breast cancer status postmastectomy, HTN, COPD, tobacco use, recent diverticulitis and cecal perforation treated with antibiotic discharged to SNF presented back to the hospital with complains of worsening abdominal pain, found to have new right upper lobe lung mass, extensive adenopathy with concerning SVC syndrome.  Patient was transferred to [facility] for radiation treatment.  Cultures ended up growing Pasteurella.  Clinical Impression  The patient reports recently DC'd from rehab. Patient has decreased memory and reports that has limited family, grandson has  Had a TBI and is not helpful. The patient  Endorses vision deficits and no longer drives nor cooks. "I forget that I have the stove on". The patient currently requires assistance for safety in the home. ? Return to rehab vs increased caregivers in the home. Pt admitted with above diagnosis.  Pt currently with functional limitations due to the deficits listed below (see PT Problem List). Pt will benefit from skilled PT to increase their independence and safety with mobility to allow discharge to the venue listed below.       Follow Up Recommendations Supervision/Assistance - 24 hour;Home health PT    Equipment Recommendations  None recommended by PT    Recommendations for Other Services       Precautions / Restrictions Precautions Precautions: Fall Precaution Comments: left vision deficits Restrictions Weight Bearing Restrictions: No      Mobility  Bed Mobility               General bed mobility comments: Hand hold to transfer to side of bed.  Transfers                 General transfer comment: Min guard / min assist guiding RW while ambulating in hall. O2 sats 89% on RA with ambulating. Recovered  quickly.  Ambulation/Gait Ambulation/Gait assistance: Min assist Gait Distance (Feet): 100 Feet Assistive device: Rolling walker (2 wheeled) Gait Pattern/deviations: Step-through pattern;Decreased stride length Gait velocity: decr   General Gait Details: assist to  move around door frame, in bathroom,, close guarding in hall for safety/vision deficits  Stairs            Wheelchair Mobility    Modified Rankin (Stroke Patients Only)       Balance Overall balance assessment: Mild deficits observed, not formally tested                                           Pertinent Vitals/Pain Pain Assessment: No/denies pain    Home Living Family/patient expects to be discharged to:: Private residence Living Arrangements: Children Available Help at Discharge: Family;Available PRN/intermittently Type of Home: Mobile home Home Access: Stairs to enter Entrance Stairs-Rails: Right;Left;Can reach both Entrance Stairs-Number of Steps: 3 Home Layout: One level Home Equipment: Cane - single point;Wheelchair - [employer] - 2 wheels Additional Comments: Reports recent discharge from rehab with  very little time at home.    Prior Function Level of Independence: Independent with assistive device(s)         Comments: using RW in home. just returned home from rehab. does not drive due to vision, grandson is TBI and not able to assist     Hand Dominance   Dominant Hand: Right    Extremity/Trunk  Assessment   Upper Extremity Assessment Upper Extremity Assessment: Defer to OT evaluation    Lower Extremity Assessment Lower Extremity Assessment: Generalized weakness    Cervical / Trunk Assessment Cervical / Trunk Assessment: Normal  Communication   Communication: No difficulties  Cognition Arousal/Alertness: Awake/alert Behavior During Therapy: WFL for tasks assessed/performed Overall Cognitive Status: No family/caregiver present to determine baseline  cognitive functioning                                 General Comments: Appears to have some mild short term memory deficits. some difficulty recalling rehab abd what she did  there      General Comments      Exercises     Assessment/Plan    PT Assessment Patient needs continued PT services  PT Problem List Decreased strength;Decreased cognition;Decreased knowledge of precautions;Decreased mobility;Decreased activity tolerance;Decreased safety awareness       PT Treatment Interventions DME instruction;Therapeutic activities;Gait training;Therapeutic exercise;Patient/family education;Stair training;Functional mobility training    PT Goals (Current goals can be found in the Care Plan section)  Acute Rehab PT Goals Patient Stated Goal: to go home and not need assistance PT Goal Formulation: With patient Time For Goal Achievement: 01/28/20 Potential to Achieve Goals: Good    Frequency Min 3X/week   Barriers to discharge Decreased caregiver support      Co-evaluation   Reason for Co-Treatment: To address functional/ADL transfers PT goals addressed during session: Mobility/safety with mobility OT goals addressed during session: ADL's and self-care       AM-PAC PT "6 Clicks" Mobility  Outcome Measure Help needed turning from your back to your side while in a flat bed without using bedrails?: None Help needed moving from lying on your back to sitting on the side of a flat bed without using bedrails?: None Help needed moving to and from a bed to a chair (including a wheelchair)?: A Little Help needed standing up from a chair using your arms (e.g., wheelchair or bedside chair)?: A Little Help needed to walk in hospital room?: A Little Help needed climbing 3-5 steps with a railing? : A Lot 6 Click Score: 19    End of Session Equipment Utilized During Treatment: Gait belt Activity Tolerance: Patient tolerated treatment well Patient left: in chair;with call  bell/phone within reach;with chair alarm set Nurse Communication: Mobility status PT Visit Diagnosis: Unsteadiness on feet (R26.81);Difficulty in walking, not elsewhere classified (R26.2)    Time: 5284-1324 PT Time Calculation (min) (ACUTE ONLY): 27 min   Charges:   PT Evaluation $PT Eval Low Complexity: [place] PT Acute Rehabilitation Services Pager [phone] Office [phone]   Claretha Cooper 01/14/2020, 2:14 PM

## 2020-01-15 ENCOUNTER — Inpatient Hospital Stay (HOSPITAL_COMMUNITY): Payer: Medicare HMO | Admitting: Hematology

## 2020-01-15 ENCOUNTER — Ambulatory Visit: Payer: Medicare HMO

## 2020-01-15 ENCOUNTER — Ambulatory Visit
Admit: 2020-01-15 | Discharge: 2020-01-15 | Disposition: A | Discharge status: Home / Self Care | Payer: Medicare HMO | Attending: Radiation Oncology | Admitting: Radiation Oncology

## 2020-01-15 ENCOUNTER — Telehealth: Payer: Self-pay | Admitting: Emergency Medicine

## 2020-01-15 LAB — BASIC METABOLIC PANEL
Anion gap: 9 (ref 5–15)
BUN: 13 mg/dL (ref 8–23)
CO2: 25 mmol/L (ref 22–32)
Calcium: 8.1 mg/dL — ABNORMAL LOW (ref 8.9–10.3)
Chloride: 103 mmol/L (ref 98–111)
Creatinine, Ser: 0.49 mg/dL (ref 0.44–1.00)
GFR calc Af Amer: >60 mL/min (ref >60)
GFR calc non Af Amer: >60 mL/min (ref >60)
Glucose, Bld: 104 mg/dL — ABNORMAL HIGH (ref 70–99)
Potassium: 4.1 mmol/L (ref 3.5–5.1)
Sodium: 137 mmol/L (ref 135–145)

## 2020-01-15 LAB — GLUCOSE, CAPILLARY
Glucose-Capillary: 107 mg/dL — ABNORMAL HIGH (ref 70–99)
Glucose-Capillary: 107 mg/dL — ABNORMAL HIGH (ref 70–99)
Glucose-Capillary: 177 mg/dL — ABNORMAL HIGH (ref 70–99)
Glucose-Capillary: 204 mg/dL — ABNORMAL HIGH (ref 70–99)

## 2020-01-15 LAB — CBC
HCT: 31 % — ABNORMAL LOW (ref 36.0–46.0)
Hemoglobin: 10.2 g/dL — ABNORMAL LOW (ref 12.0–15.0)
MCH: 30.6 pg (ref 26.0–34.0)
MCHC: 32.9 g/dL (ref 30.0–36.0)
MCV: 93.1 fL (ref 80.0–100.0)
Platelets: 163 10*3/uL (ref 150–400)
RBC: 3.33 MIL/uL — ABNORMAL LOW (ref 3.87–5.11)
RDW: 15.9 % — ABNORMAL HIGH (ref 11.5–15.5)
WBC: 7 10*3/uL (ref 4.0–10.5)
nRBC: 0 % (ref 0.0–0.2)

## 2020-01-15 LAB — CULTURE, BLOOD (ROUTINE X 2)
Culture: NO GROWTH
Special Requests: ADEQUATE

## 2020-01-15 LAB — MAGNESIUM: Magnesium: 1.7 mg/dL (ref 1.7–2.4)

## 2020-01-15 LAB — SURGICAL PATHOLOGY

## 2020-01-15 NOTE — Telephone Encounter (Signed)
Attempted to call patient with pathology results. No answer - left a message   Path shows high grade poorly differentiated squamous cell CA, did not stain consistent with breast CA.   I will forward the results to Dr Sondra Come who saw her for Radiation Oncology

## 2020-01-15 NOTE — Progress Notes (Signed)
Physical Therapy Treatment Patient Details Name: [name] MRN: [id number] DOB: April 12, 1941 Today's Date: 01/15/2020    History of Present Illness 79 year old with history of DM2, HLD, breast cancer status postmastectomy, HTN, COPD, tobacco use, recent diverticulitis and cecal perforation treated with antibiotic discharged to SNF presented back to the hospital with complains of worsening abdominal pain, found to have new right upper lobe lung mass, extensive adenopathy with concerning SVC syndrome.  Patient was transferred to [facility] for radiation treatment.  Cultures ended up growing Pasteurella.    PT Comments    Pt in bed with Grandson visiting.  Assisted OOB to amb to bathroom.  General bed mobility comments: increased time and use of rail.  General transfer comment: low vision assist but self able to get OOB and on/off toilet < 25% VC's safety with turns.  Pt able to self perform peri care in standing with fair balance.  General Gait Details: low vision asssit as well as VC's safety with turns.  Trial amb without RW, pt was too unsteady.  "My balance has been off since my stroke".  Pt has a walker at home she uses when she is out and about but admits to "furniture" walking in her home. Pt plans to return home with Grandson who helps with [employer]. Pt has a walker from prior admit.   Follow Up Recommendations  Supervision/Assistance - 24 hour;Home health PT     Equipment Recommendations  None recommended by PT    Recommendations for Other Services       Precautions / Restrictions Precautions Precautions: Fall Precaution Comments: left vision deficits    Mobility  Bed Mobility Overal bed mobility: Needs Assistance Bed Mobility: Supine to Sit     Supine to sit: Supervision     General bed mobility comments: increased time and use of rail  Transfers Overall transfer level: Needs assistance Equipment used: Rolling walker (2 wheeled);None Transfers: Sit  to/from [employer] to Stand: Supervision Stand pivot transfers: Supervision;Min guard       General transfer comment: low vision assist but self able to get OOB and on/off toilet < 25% VC's safety with turns  Ambulation/Gait Ambulation/Gait assistance: Min guard;Min assist Gait Distance (Feet): 85 Feet Assistive device: Rolling walker (2 wheeled);None Gait Pattern/deviations: Step-through pattern;Decreased stride length Gait velocity: decreased   General Gait Details: low vision asssit as well as VC's safety with turns.  Trial amb without RW, pt was too unsteady.  "My balance has been off since my stroke".  Pt has a walker at home she uses when she is out and about but admits to "furniture" walking in her home.   Stairs             Wheelchair Mobility    Modified Rankin (Stroke Patients Only)       Balance                                            Cognition Arousal/Alertness: Awake/alert Behavior During Therapy: WFL for tasks assessed/performed Overall Cognitive Status: No family/caregiver present to determine baseline cognitive functioning                                 General Comments: AxO x 3 sweet      Exercises  General Comments        Pertinent Vitals/Pain Pain Assessment: No/denies pain    Home Living                      Prior Function            PT Goals (current goals can now be found in the care plan section) Progress towards PT goals: Progressing toward goals    Frequency    Min 3X/week      PT Plan Current plan remains appropriate    Co-evaluation              AM-PAC PT "6 Clicks" Mobility   Outcome Measure  Help needed turning from your back to your side while in a flat bed without using bedrails?: None Help needed moving from lying on your back to sitting on the side of a flat bed without using bedrails?: None Help needed moving to and from a bed  to a chair (including a wheelchair)?: A Little Help needed standing up from a chair using your arms (e.g., wheelchair or bedside chair)?: A Little Help needed to walk in hospital room?: A Little Help needed climbing 3-5 steps with a railing? : A Lot 6 Click Score: 19    End of Session Equipment Utilized During Treatment: Gait belt Activity Tolerance: Patient tolerated treatment well Patient left: in chair;with call bell/phone within reach;with chair alarm set Nurse Communication: Mobility status PT Visit Diagnosis: Unsteadiness on feet (R26.81);Difficulty in walking, not elsewhere classified (R26.2)     Time: 3149-7026 PT Time Calculation (min) (ACUTE ONLY): 27 min  Charges:  $Gait Training: 8-22 mins $Therapeutic Activity: 8-22 mins                     Rica Koyanagi  PTA Acute  Rehabilitation Services Pager      [phone] Office      [phone]

## 2020-01-15 NOTE — Progress Notes (Signed)
PROGRESS NOTE    [name]  PYP:[id number] DOB: [birth year] DOA: 01/10/2020 PCP: Rosita Fire, MD   Brief Narrative:  79 year old with history of DM2, HLD, breast cancer status postmastectomy, HTN, COPD, tobacco use, recent diverticulitis and cecal perforation treated with antibiotic discharged to SNF presented back to the hospital with complains of worsening abdominal pain, found to have new right upper lobe lung mass, extensive adenopathy with concerning SVC syndrome.  Patient was transferred to [facility] for radiation treatment.  Cultures ended up growing Pasteurella.   Assessment & Plan:   Active Problems:   Infiltrating ductal carcinoma of breast (HCC)   Type 2 diabetes mellitus with hyperlipidemia (HCC)   Benign essential HTN   Hyperlipidemia   Sepsis ([place])   Cecal diverticulitis   Mass of upper lobe of right lung   Cellulitis of right upper arm   Sepsis secondary to Pasteurella bacteremia Recent cecal perforation secondary to diverticulitis UTI- UCx= Kluyvera Ascorbata -Culture data reviewed, sepsis physiology improving -Continue IV Unasyn.  As of now we will plan of total of 10 days of antibiotics. -CT reports reviewed.  She will need outpatient colonoscopy in the near future-6 to 8 weeks.  Right upper lobe mass with extensive mediastinal adenopathy and early SVC syndrome-slightly worsening Right arm swelling secondary to SVC syndrome -Status post CT-guided biopsy of right upper lung mass 6/14 -Slight worsening of her right upper extremity swelling -Path report-still pending -First radiation treatment 6/15; 6/16.  Total 14 plan tentatively.  History of breast cancer -Previously treated with mastectomy in 2010, also was on p.o. medications for 5 years.  Followed Dr. Jamey Reas.  Oncology team has been notified  History of COPD with tobacco use -Bronchodilators as needed, incentive spirometer and flutter valve  Diabetes mellitus type 2 -Insulin sliding  scale and Accu-Chek  Generalized weakness -PT/OT  DVT prophylaxis:  Code Status: DNR Family Communication: None at bedside  Status is: Inpatient  Remains inpatient appropriate because:IV treatments appropriate due to intensity of illness or inability to take PO   Dispo: The patient is from: SNF              Anticipated d/c is to: SNF              Anticipated d/c date is: > 3 days              Patient currently is not medically stable to d/c.  Maintain hospital stay for radiation treatments, awaiting path report.   Subjective: Only reports of worsening of right upper extremity swelling otherwise no other new complaints  Review of Systems Otherwise negative except as per HPI, including: General: Denies fever, chills, night sweats or unintended weight loss. Resp: Denies cough, wheezing, shortness of breath. Cardiac: Denies chest pain, palpitations, orthopnea, paroxysmal nocturnal dyspnea. GI: Denies abdominal pain, nausea, vomiting, diarrhea or constipation GU: Denies dysuria, frequency, hesitancy or incontinence MS: Denies muscle aches, joint pain or swelling Neuro: Denies headache, neurologic deficits (focal weakness, numbness, tingling), abnormal gait Psych: Denies anxiety, depression, SI/HI/AVH Skin: Denies new rashes or lesions ID: Denies sick contacts, exotic exposures, travel  Examination:  Constitutional: Not in acute distress Respiratory: Clear to auscultation bilaterally Cardiovascular: Normal sinus rhythm, no rubs Abdomen: Nontender nondistended good bowel sounds Musculoskeletal: Right upper extremity slightly more swollen than yesterday Skin: No rashes seen Neurologic: CN 2-12 grossly intact.  And nonfocal Psychiatric: Normal judgment and insight. Alert and oriented x 3. Normal mood. Objective: Vitals:   01/14/20 1214 01/14/20 2124 01/15/20  2595 01/15/20 0825  BP: 120/69 136/65 133/68   Pulse: 69 69 70   Resp: 15 18 16    Temp: 97.7 F (36.5 C) 97.7 F  (36.5 C) (!) 97.5 F (36.4 C)   TempSrc: Oral Oral Oral   SpO2: 99% 95% 97% 96%  Weight:      Height:        Intake/Output Summary (Last 24 hours) at 01/15/2020 1208 Last data filed at 01/15/2020 0830 Gross per 24 hour  Intake 480 ml  Output --  Net 480 ml   Filed Weights   01/10/20 1303  Weight: 63 kg     Data Reviewed:   CBC: Recent Labs  Lab 01/08/20 1357 01/10/20 1315 01/12/20 0458 01/14/20 0617 01/15/20 0547  WBC 8.7 19.0* 6.9 6.7 7.0  NEUTROABS 6.6 17.4*  --   --   --   HGB 12.0 12.2 9.9* 10.2* 10.2*  HCT 38.3 38.1 31.3* 32.1* 31.0*  MCV 95.5 93.8 95.7 96.1 93.1  PLT 210 177 148* 180 638   Basic Metabolic Panel: Recent Labs  Lab 01/08/20 1357 01/10/20 1315 01/12/20 0458 01/14/20 0617 01/15/20 0547  NA 131* 132* 139 139 137  K 4.6 4.1 4.0 3.8 4.1  CL 97* 97* 107 107 103  CO2 23 25 24 25 25   GLUCOSE 110* 156* 137* 103* 104*  BUN 9 10 5* 9 13  CREATININE 0.76 0.84 0.64 0.41* 0.49  CALCIUM 9.8 9.1 8.4* 8.2* 8.1*  MG  --   --   --  1.8 1.7   GFR: Estimated Creatinine Clearance: 50.5 mL/min (by C-G formula based on SCr of 0.49 mg/dL). Liver Function Tests: Recent Labs  Lab 01/08/20 1357 01/10/20 1315 01/12/20 0458  AST 18 13* 12*  ALT 10 9 8   ALKPHOS 41 43 32*  BILITOT 0.3 0.9 0.9  PROT 6.8 7.0 5.4*  ALBUMIN 3.5 3.4* 2.3*   No results for input(s): LIPASE, AMYLASE in the last 168 hours. No results for input(s): AMMONIA in the last 168 hours. Coagulation Profile: Recent Labs  Lab 01/10/20 1315  INR 1.0   Cardiac Enzymes: No results for input(s): CKTOTAL, CKMB, CKMBINDEX, TROPONINI in the last 168 hours. BNP (last 3 results) No results for input(s): PROBNP in the last 8760 hours. HbA1C: No results for input(s): HGBA1C in the last 72 hours. CBG: Recent Labs  Lab 01/14/20 0731 01/14/20 1137 01/14/20 1648 01/14/20 2146 01/15/20 0831  GLUCAP 107* 145* 184* 214* 107*   Lipid Profile: No results for input(s): CHOL, HDL, LDLCALC,  TRIG, CHOLHDL, LDLDIRECT in the last 72 hours. Thyroid Function Tests: No results for input(s): TSH, T4TOTAL, FREET4, T3FREE, THYROIDAB in the last 72 hours. Anemia Panel: No results for input(s): VITAMINB12, FOLATE, FERRITIN, TIBC, IRON, RETICCTPCT in the last 72 hours. Sepsis Labs: Recent Labs  Lab 01/10/20 1315  LATICACIDVEN 1.6    Recent Results (from the past 240 hour(s))  Blood Culture (routine x 2)     Status: Abnormal   Collection Time: 01/10/20  1:15 PM   Specimen: BLOOD  Result Value Ref Range Status   Specimen Description   Final    BLOOD LEFT ANTECUBITAL Performed at [facility], [place] [address], [place], [place] [address]    Special Requests   Final    BOTTLES DRAWN AEROBIC AND ANAEROBIC Blood Culture adequate volume Performed at [facility], [address] [place], [place] [address]    Culture  Setup Time   Final    GRAM NEGATIVE  RODS Gram Stain Report Called to,Read Back By and Verified With: SAPPELT,J @ 5465 ON 01/11/20 BY JUW AEROBIC BOTTLE ONLY GS DONE @ [facility] GRAM NEGATIVE RODS ANAEROBIC BOTTLE ONLY Gram Stain Report Called to,Read Back By and Verified With: PREVIOUSLY CALLED @511  01/11/2020 KAY CRITICAL RESULT CALLED TO, READ BACK BY AND VERIFIED WITH: PHARMD MIKE M [phone] [id number] FCP    Culture (A)  Final    PASTEURELLA MULTOCIDA Usually susceptible to penicillin and other beta lactam agents,quinolones,macrolides and tetracyclines. Performed at [facility], [place] [address], [place], [place] [address]    Report Status 01/13/2020 FINAL  Final  Blood Culture ID Panel (Reflexed)     Status: None   Collection Time: 01/10/20  1:15 PM  Result Value Ref Range Status   Enterococcus species NOT DETECTED NOT DETECTED Final   Listeria monocytogenes NOT DETECTED NOT DETECTED Final   Staphylococcus species NOT DETECTED NOT DETECTED Final   Staphylococcus aureus (BCID) NOT DETECTED NOT DETECTED Final   Streptococcus species NOT DETECTED NOT DETECTED Final    Streptococcus agalactiae NOT DETECTED NOT DETECTED Final   Streptococcus pneumoniae NOT DETECTED NOT DETECTED Final   Streptococcus pyogenes NOT DETECTED NOT DETECTED Final   Acinetobacter baumannii NOT DETECTED NOT DETECTED Final   Enterobacteriaceae species NOT DETECTED NOT DETECTED Final   Enterobacter cloacae complex NOT DETECTED NOT DETECTED Final   Escherichia coli NOT DETECTED NOT DETECTED Final   Klebsiella oxytoca NOT DETECTED NOT DETECTED Final   Klebsiella pneumoniae NOT DETECTED NOT DETECTED Final   Proteus species NOT DETECTED NOT DETECTED Final   Serratia marcescens NOT DETECTED NOT DETECTED Final   Haemophilus influenzae NOT DETECTED NOT DETECTED Final   Neisseria meningitidis NOT DETECTED NOT DETECTED Final   Pseudomonas aeruginosa NOT DETECTED NOT DETECTED Final   Candida albicans NOT DETECTED NOT DETECTED Final   Candida glabrata NOT DETECTED NOT DETECTED Final   Candida krusei NOT DETECTED NOT DETECTED Final   Candida parapsilosis NOT DETECTED NOT DETECTED Final   Candida tropicalis NOT DETECTED NOT DETECTED Final    Comment: Performed at [facility], [place] [address], [place], [place] [address]  Blood Culture (routine x 2)     Status: None   Collection Time: 01/10/20  1:47 PM   Specimen: BLOOD LEFT ARM  Result Value Ref Range Status   Specimen Description BLOOD LEFT ARM BOTTLES DRAWN AEROBIC AND ANAEROBIC  Final   Special Requests Blood Culture adequate volume  Final   Culture   Final    NO GROWTH 5 DAYS Performed at [facility], [address], [place], [place] [address]    Report Status 01/15/2020 FINAL  Final  Urine culture     Status: Abnormal   Collection Time: 01/10/20  2:00 PM   Specimen: Urine, Catheterized  Result Value Ref Range Status   Specimen Description   Final    URINE, CATHETERIZED Performed at [facility], [address] [place], [place] [address]    Special Requests   Final    NONE Performed at [facility], [address]  [place], [place] [address]    Culture >=100,000 COLONIES/mL Humberto Seals (A)  Final   Report Status 01/13/2020 FINAL  Final   Organism ID, Bacteria KLUYVERA ASCORBATA (A)  Final      Susceptibility   Kluyvera ascorbata - MIC*    AMPICILLIN >=32 RESISTANT Resistant     CEFAZOLIN >=64 RESISTANT Resistant     CEFTRIAXONE <=1 SENSITIVE Sensitive  CIPROFLOXACIN <=0.25 SENSITIVE Sensitive     GENTAMICIN <=1 SENSITIVE Sensitive     IMIPENEM <=0.25 SENSITIVE Sensitive     NITROFURANTOIN 64 INTERMEDIATE Intermediate     TRIMETH/SULFA <=20 SENSITIVE Sensitive     AMPICILLIN/SULBACTAM <=2 SENSITIVE Sensitive     * >=100,000 COLONIES/mL KLUYVERA ASCORBATA  SARS Coronavirus 2 by RT PCR (hospital order, performed in [facility] hospital lab) Nasopharyngeal Nasopharyngeal Swab     Status: None   Collection Time: 01/10/20  2:00 PM   Specimen: Nasopharyngeal Swab  Result Value Ref Range Status   SARS Coronavirus 2 NEGATIVE NEGATIVE Final    Comment: (NOTE) SARS-CoV-2 target nucleic acids are NOT DETECTED.  The SARS-CoV-2 RNA is generally detectable in upper and lower respiratory specimens during the acute phase of infection. The lowest concentration of SARS-CoV-2 viral copies this assay can detect is 250 copies / mL. A negative result does not preclude SARS-CoV-2 infection and should not be used as the sole basis for treatment or other patient management decisions.  A negative result may occur with improper specimen collection / handling, submission of specimen other than nasopharyngeal swab, presence of viral mutation(s) within the areas targeted by this assay, and inadequate number of viral copies (<250 copies / mL). A negative result must be combined with clinical observations, patient history, and epidemiological information.  Fact Sheet for Patients:   StrictlyIdeas.no  Fact Sheet for Healthcare Providers: BankingDealers.co.za  This test  is not yet approved or  cleared by the [place] FDA and has been authorized for detection and/or diagnosis of SARS-CoV-2 by FDA under an Emergency Use Authorization (EUA).  This EUA will remain in effect (meaning this test can be used) for the duration of the COVID-19 declaration under Section 564(b)(1) of the Act, 21 U.S.C. section 360bbb-3(b)(1), unless the authorization is terminated or revoked sooner.  Performed at [facility], [address], [place], [place] [address]          Radiology Studies: No results found.      Scheduled Meds: . dexamethasone (DECADRON) injection  4 mg Intravenous Q24H  . feeding supplement (GLUCERNA SHAKE)  237 mL Oral TID BM  . ferrous sulfate  325 mg Oral Q breakfast  . gabapentin  600 mg Oral TID  . insulin aspart  0-15 Units Subcutaneous TID WC  . metoprolol tartrate  12.5 mg Oral BID  . mometasone-formoterol  2 puff Inhalation BID  . multivitamin with minerals  1 tablet Oral Daily  . pantoprazole  40 mg Oral Daily  . pravastatin  40 mg Oral q1800  . QUEtiapine  12.5 mg Oral QHS  . senna  1 tablet Oral BID   Continuous Infusions: . ampicillin-sulbactam (UNASYN) IV 3 g (01/15/20 0552)     LOS: 5 days   Time spent= 35 mins    Rukaya Kleinschmidt Arsenio Loader, MD [facility]  If 7PM-7AM, please contact night-coverage  01/15/2020, 12:08 PM

## 2020-01-16 ENCOUNTER — Encounter (HOSPITAL_COMMUNITY): Payer: Self-pay | Admitting: *Deleted

## 2020-01-16 ENCOUNTER — Ambulatory Visit
Admit: 2020-01-16 | Discharge: 2020-01-16 | Disposition: A | Discharge status: Home / Self Care | Payer: Medicare HMO | Attending: Radiation Oncology | Admitting: Radiation Oncology

## 2020-01-16 ENCOUNTER — Ambulatory Visit: Payer: Medicare HMO

## 2020-01-16 LAB — GLUCOSE, CAPILLARY
Glucose-Capillary: 99 mg/dL (ref 70–99)
Glucose-Capillary: 90 mg/dL (ref 70–99)
Glucose-Capillary: 216 mg/dL — ABNORMAL HIGH (ref 70–99)
Glucose-Capillary: 202 mg/dL — ABNORMAL HIGH (ref 70–99)

## 2020-01-16 LAB — BASIC METABOLIC PANEL
Anion gap: 5 (ref 5–15)
BUN: 12 mg/dL (ref 8–23)
CO2: 27 mmol/L (ref 22–32)
Calcium: 7.8 mg/dL — ABNORMAL LOW (ref 8.9–10.3)
Chloride: 105 mmol/L (ref 98–111)
Creatinine, Ser: 0.48 mg/dL (ref 0.44–1.00)
GFR calc Af Amer: >60 mL/min (ref >60)
GFR calc non Af Amer: >60 mL/min (ref >60)
Glucose, Bld: 121 mg/dL — ABNORMAL HIGH (ref 70–99)
Potassium: 3.8 mmol/L (ref 3.5–5.1)
Sodium: 137 mmol/L (ref 135–145)

## 2020-01-16 LAB — CBC
HCT: 32.3 % — ABNORMAL LOW (ref 36.0–46.0)
Hemoglobin: 10.2 g/dL — ABNORMAL LOW (ref 12.0–15.0)
MCH: 29.5 pg (ref 26.0–34.0)
MCHC: 31.6 g/dL (ref 30.0–36.0)
MCV: 93.4 fL (ref 80.0–100.0)
Platelets: 198 10*3/uL (ref 150–400)
RBC: 3.46 MIL/uL — ABNORMAL LOW (ref 3.87–5.11)
RDW: 15.9 % — ABNORMAL HIGH (ref 11.5–15.5)
WBC: 8.1 10*3/uL (ref 4.0–10.5)
nRBC: 0 % (ref 0.0–0.2)

## 2020-01-16 LAB — MAGNESIUM: Magnesium: 1.8 mg/dL (ref 1.7–2.4)

## 2020-01-16 MED ORDER — ENOXAPARIN SODIUM 40 MG/0.4ML ~~LOC~~ SOLN
40.0000 mg | SUBCUTANEOUS | Status: DC
  Administered 2020-01-16 – 2020-01-22 (×7): 40 mg via SUBCUTANEOUS
  Filled 2020-01-16 (×7): qty 0.4

## 2020-01-16 NOTE — Progress Notes (Addendum)
PROGRESS NOTE    [name]  CZY:[id number] DOB: Oct 25, 1940 DOA: 01/10/2020 PCP: Rosita Fire, MD   Brief Narrative:  78 year old with history of DM2, HLD, breast cancer status postmastectomy, HTN, COPD, tobacco use, recent diverticulitis and cecal perforation treated with antibiotic discharged to SNF presented back to the hospital with complains of worsening abdominal pain, found to have new right upper lobe lung mass, extensive adenopathy with concerning SVC syndrome.  Patient was transferred to [facility] for radiation treatment.  Cultures ended up growing Pasteurella. Biopsy showed Poor diff SCC.    Assessment & Plan:   Active Problems:   Infiltrating ductal carcinoma of breast (HCC)   Type 2 diabetes mellitus with hyperlipidemia (HCC)   Benign essential HTN   Hyperlipidemia   Sepsis ([place])   Cecal diverticulitis   Mass of upper lobe of right lung   Cellulitis of right upper arm   Sepsis secondary to Pasteurella bacteremia Recent cecal perforation secondary to diverticulitis UTI- UCx= Kluyvera Ascorbata -Culture data reviewed, sepsis physiology improving -Continue IV Unasyn.  Total 10 days planned -CT reports reviewed.  She will need outpatient colonoscopy in the near future-6 to 8 weeks.  Right upper lobe mass with extensive mediastinal adenopathy and early SVC syndrome-slightly worsening High-grade poorly differentiated squamous cell carcinoma Right arm swelling secondary to SVC syndrome -Status post CT-guided biopsy of right upper lung mass 6/14 -Slight worsening of her right upper extremity swelling -Path report-high-grade poorly differentiated squamous cell carcinoma -First radiation treatment 6/15; 6/16 6/17.  Total 14 plan tentatively. -Oncology team aware of diagnosis.  Further plans to be determined by oncology/radiation team.  History of breast cancer -Previously treated with mastectomy in 2010, also was on p.o. medications for 5 years.  Followed Dr.  Jamey Reas.  Oncology team has been notified  History of COPD with tobacco use -Bronchodilators as needed, incentive spirometer and flutter valve  Diabetes mellitus type 2 -Insulin sliding scale and Accu-Chek  Generalized weakness -PT/OT-Home with home health  DVT prophylaxis: Lovenox  Code Status: DNR Family Communication: Spoke with [name], her grandson.   Status is: Inpatient  Remains inpatient appropriate because:IV treatments appropriate due to intensity of illness or inability to take PO   Dispo: The patient is from: SNF              Anticipated d/c is to: Home health              Anticipated d/c date is: 1-2 days              Patient currently is not medically stable to d/c.  Patient still has SVC syndrome with right upper extremity swelling which is slowly improving.  Now with tissue diagnosis, will await radiation oncology and oncology input regarding follow-up care plan.   Subjective: Seen and examined at bedside, her right upper extremity swelling is slightly better.  No other new complaints.  Review of Systems Otherwise negative except as per HPI, including: General: Denies fever, chills, night sweats or unintended weight loss. Resp: Denies cough, wheezing, shortness of breath. Cardiac: Denies chest pain, palpitations, orthopnea, paroxysmal nocturnal dyspnea. GI: Denies abdominal pain, nausea, vomiting, diarrhea or constipation GU: Denies dysuria, frequency, hesitancy or incontinence MS: Denies muscle aches, joint pain or swelling Neuro: Denies headache, neurologic deficits (focal weakness, numbness, tingling), abnormal gait Psych: Denies anxiety, depression, SI/HI/AVH Skin: Denies new rashes or lesions ID: Denies sick contacts, exotic exposures, travel  Examination: Constitutional: Not in acute distress Respiratory: Clear to auscultation bilaterally Cardiovascular:  Normal sinus rhythm, no rubs Abdomen: Nontender nondistended good bowel  sounds Musculoskeletal: No edema noted Skin: Right upper extremity swelling slightly better compared to yesterday Neurologic: CN 2-12 grossly intact.  And nonfocal Psychiatric: Normal judgment and insight. Alert and oriented x 3. Normal mood. Vitals:   01/15/20 1451 01/15/20 1933 01/15/20 2210 01/16/20 0630  BP: 129/67 127/69  (!) 146/66  Pulse: 79 83  76  Resp: 18 14  14   Temp: 98.2 F (36.8 C) 98.1 F (36.7 C)  (!) 97.5 F (36.4 C)  TempSrc: Oral Oral  Oral  SpO2: 95% 92% 97% 94%  Weight:      Height:        Intake/Output Summary (Last 24 hours) at 01/16/2020 1100 Last data filed at 01/16/2020 0630 Gross per 24 hour  Intake 660 ml  Output 1100 ml  Net -440 ml   Filed Weights   01/10/20 1303  Weight: 63 kg     Data Reviewed:   CBC: Recent Labs  Lab 01/10/20 1315 01/12/20 0458 01/14/20 0617 01/15/20 0547 01/16/20 0539  WBC 19.0* 6.9 6.7 7.0 8.1  NEUTROABS 17.4*  --   --   --   --   HGB 12.2 9.9* 10.2* 10.2* 10.2*  HCT 38.1 31.3* 32.1* 31.0* 32.3*  MCV 93.8 95.7 96.1 93.1 93.4  PLT 177 148* 180 163 235   Basic Metabolic Panel: Recent Labs  Lab 01/10/20 1315 01/12/20 0458 01/14/20 0617 01/15/20 0547 01/16/20 0539  NA 132* 139 139 137 137  K 4.1 4.0 3.8 4.1 3.8  CL 97* 107 107 103 105  CO2 25 24 25 25 27   GLUCOSE 156* 137* 103* 104* 121*  BUN 10 5* 9 13 12   CREATININE 0.84 0.64 0.41* 0.49 0.48  CALCIUM 9.1 8.4* 8.2* 8.1* 7.8*  MG  --   --  1.8 1.7 1.8   GFR: Estimated Creatinine Clearance: 50.5 mL/min (by C-G formula based on SCr of 0.48 mg/dL). Liver Function Tests: Recent Labs  Lab 01/10/20 1315 01/12/20 0458  AST 13* 12*  ALT 9 8  ALKPHOS 43 32*  BILITOT 0.9 0.9  PROT 7.0 5.4*  ALBUMIN 3.4* 2.3*   No results for input(s): LIPASE, AMYLASE in the last 168 hours. No results for input(s): AMMONIA in the last 168 hours. Coagulation Profile: Recent Labs  Lab 01/10/20 1315  INR 1.0   Cardiac Enzymes: No results for input(s): CKTOTAL,  CKMB, CKMBINDEX, TROPONINI in the last 168 hours. BNP (last 3 results) No results for input(s): PROBNP in the last 8760 hours. HbA1C: No results for input(s): HGBA1C in the last 72 hours. CBG: Recent Labs  Lab 01/15/20 0831 01/15/20 1210 01/15/20 1705 01/15/20 2044 01/16/20 0733  GLUCAP 107* 107* 177* 204* 99   Lipid Profile: No results for input(s): CHOL, HDL, LDLCALC, TRIG, CHOLHDL, LDLDIRECT in the last 72 hours. Thyroid Function Tests: No results for input(s): TSH, T4TOTAL, FREET4, T3FREE, THYROIDAB in the last 72 hours. Anemia Panel: No results for input(s): VITAMINB12, FOLATE, FERRITIN, TIBC, IRON, RETICCTPCT in the last 72 hours. Sepsis Labs: Recent Labs  Lab 01/10/20 1315  LATICACIDVEN 1.6    Recent Results (from the past 240 hour(s))  Blood Culture (routine x 2)     Status: Abnormal   Collection Time: 01/10/20  1:15 PM   Specimen: BLOOD  Result Value Ref Range Status   Specimen Description   Final    BLOOD LEFT ANTECUBITAL Performed at [facility], [place] [address], [place], [place]  [address]    Special Requests   Final    BOTTLES DRAWN AEROBIC AND ANAEROBIC Blood Culture adequate volume Performed at [facility], [address], [place], [place] [address]    Culture  Setup Time   Final    GRAM NEGATIVE RODS Gram Stain Report Called to,Read Back By and Verified With: SAPPELT,J @ 7510 ON 01/11/20 BY JUW AEROBIC BOTTLE ONLY GS DONE @ [facility] GRAM NEGATIVE RODS ANAEROBIC BOTTLE ONLY Gram Stain Report Called to,Read Back By and Verified With: PREVIOUSLY CALLED @511  01/11/2020 KAY CRITICAL RESULT CALLED TO, READ BACK BY AND VERIFIED WITH: PHARMD MIKE M [id number] FCP    Culture (A)  Final    PASTEURELLA MULTOCIDA Usually susceptible to penicillin and other beta lactam agents,quinolones,macrolides and tetracyclines. Performed at [facility], [place] [address] [place], [place] [address]    Report Status 01/13/2020 FINAL  Final  Blood Culture ID Panel  (Reflexed)     Status: None   Collection Time: 01/10/20  1:15 PM  Result Value Ref Range Status   Enterococcus species NOT DETECTED NOT DETECTED Final   Listeria monocytogenes NOT DETECTED NOT DETECTED Final   Staphylococcus species NOT DETECTED NOT DETECTED Final   Staphylococcus aureus (BCID) NOT DETECTED NOT DETECTED Final   Streptococcus species NOT DETECTED NOT DETECTED Final   Streptococcus agalactiae NOT DETECTED NOT DETECTED Final   Streptococcus pneumoniae NOT DETECTED NOT DETECTED Final   Streptococcus pyogenes NOT DETECTED NOT DETECTED Final   Acinetobacter baumannii NOT DETECTED NOT DETECTED Final   Enterobacteriaceae species NOT DETECTED NOT DETECTED Final   Enterobacter cloacae complex NOT DETECTED NOT DETECTED Final   Escherichia coli NOT DETECTED NOT DETECTED Final   Klebsiella oxytoca NOT DETECTED NOT DETECTED Final   Klebsiella pneumoniae NOT DETECTED NOT DETECTED Final   Proteus species NOT DETECTED NOT DETECTED Final   Serratia marcescens NOT DETECTED NOT DETECTED Final   Haemophilus influenzae NOT DETECTED NOT DETECTED Final   Neisseria meningitidis NOT DETECTED NOT DETECTED Final   Pseudomonas aeruginosa NOT DETECTED NOT DETECTED Final   Candida albicans NOT DETECTED NOT DETECTED Final   Candida glabrata NOT DETECTED NOT DETECTED Final   Candida krusei NOT DETECTED NOT DETECTED Final   Candida parapsilosis NOT DETECTED NOT DETECTED Final   Candida tropicalis NOT DETECTED NOT DETECTED Final    Comment: Performed at [facility], [place] [address], [place], [place] [address]  Blood Culture (routine x 2)     Status: None   Collection Time: 01/10/20  1:47 PM   Specimen: BLOOD LEFT ARM  Result Value Ref Range Status   Specimen Description BLOOD LEFT ARM BOTTLES DRAWN AEROBIC AND ANAEROBIC  Final   Special Requests Blood Culture adequate volume  Final   Culture   Final    NO GROWTH 5 DAYS Performed at [facility], [address], [place], [place] [address]     Report Status 01/15/2020 FINAL  Final  Urine culture     Status: Abnormal   Collection Time: 01/10/20  2:00 PM   Specimen: Urine, Catheterized  Result Value Ref Range Status   Specimen Description   Final    URINE, CATHETERIZED Performed at [facility], [address], [place], [place] [address]    Special Requests   Final    NONE Performed at [facility], [address] [place], [place] [address]    Culture >=100,000 COLONIES/mL Humberto Seals (A)  Final   Report Status 01/13/2020 FINAL  Final  Organism ID, Bacteria KLUYVERA ASCORBATA (A)  Final      Susceptibility   Kluyvera ascorbata - MIC*    AMPICILLIN >=32 RESISTANT Resistant     CEFAZOLIN >=64 RESISTANT Resistant     CEFTRIAXONE <=1 SENSITIVE Sensitive     CIPROFLOXACIN <=0.25 SENSITIVE Sensitive     GENTAMICIN <=1 SENSITIVE Sensitive     IMIPENEM <=0.25 SENSITIVE Sensitive     NITROFURANTOIN 64 INTERMEDIATE Intermediate     TRIMETH/SULFA <=20 SENSITIVE Sensitive     AMPICILLIN/SULBACTAM <=2 SENSITIVE Sensitive     * >=100,000 COLONIES/mL KLUYVERA ASCORBATA  SARS Coronavirus 2 by RT PCR (hospital order, performed in [facility] hospital lab) Nasopharyngeal Nasopharyngeal Swab     Status: None   Collection Time: 01/10/20  2:00 PM   Specimen: Nasopharyngeal Swab  Result Value Ref Range Status   SARS Coronavirus 2 NEGATIVE NEGATIVE Final    Comment: (NOTE) SARS-CoV-2 target nucleic acids are NOT DETECTED.  The SARS-CoV-2 RNA is generally detectable in upper and lower respiratory specimens during the acute phase of infection. The lowest concentration of SARS-CoV-2 viral copies this assay can detect is 250 copies / mL. A negative result does not preclude SARS-CoV-2 infection and should not be used as the sole basis for treatment or other patient management decisions.  A negative result may occur with improper specimen collection / handling, submission of specimen other than nasopharyngeal swab, presence of viral  mutation(s) within the areas targeted by this assay, and inadequate number of viral copies (<250 copies / mL). A negative result must be combined with clinical observations, patient history, and epidemiological information.  Fact Sheet for Patients:   StrictlyIdeas.no  Fact Sheet for Healthcare Providers: BankingDealers.co.za  This test is not yet approved or  cleared by the [place] FDA and has been authorized for detection and/or diagnosis of SARS-CoV-2 by FDA under an Emergency Use Authorization (EUA).  This EUA will remain in effect (meaning this test can be used) for the duration of the COVID-19 declaration under Section 564(b)(1) of the Act, 21 U.S.C. section 360bbb-3(b)(1), unless the authorization is terminated or revoked sooner.  Performed at [facility], [address] [place], [place] [address]          Radiology Studies: No results found.      Scheduled Meds: . dexamethasone (DECADRON) injection  4 mg Intravenous Q24H  . feeding supplement (GLUCERNA SHAKE)  237 mL Oral TID BM  . ferrous sulfate  325 mg Oral Q breakfast  . gabapentin  600 mg Oral TID  . insulin aspart  0-15 Units Subcutaneous TID WC  . metoprolol tartrate  12.5 mg Oral BID  . mometasone-formoterol  2 puff Inhalation BID  . multivitamin with minerals  1 tablet Oral Daily  . pantoprazole  40 mg Oral Daily  . pravastatin  40 mg Oral q1800  . QUEtiapine  12.5 mg Oral QHS  . senna  1 tablet Oral BID   Continuous Infusions: . ampicillin-sulbactam (UNASYN) IV 3 g (01/16/20 0458)     LOS: 6 days   Time spent= 35 mins    Kyeshia Zinn Arsenio Loader, MD [facility]  If 7PM-7AM, please contact night-coverage  01/16/2020, 11:00 AM

## 2020-01-16 NOTE — Progress Notes (Signed)
Brief Oncology Note:  [name] is an 79 year old female with a past medical history significant for hypertension, CVA, intraductal carcinoma the right breast with mastectomy 2010 followed by 5 years of hormonal therapy.  She presented to the emergency room with pain in her abdomen and arm.  On admission, she was febrile and hypotensive.  She was also hypoxic with leukocytosis.  CT chest revealed a right upper lobe tumor with invasion to the chest wall and ribs, mediastinal adenopathy invading and narrowing the SVC, paratracheal adenopathy, and multiple scattered nodules in the left upper lobe.  CT of the abdomen revealed an enlarging phlegmon at the cecum.  During this admission, she had a CT-guided biopsy of her right upper lobe lung mass.  Pathology returned earlier today which showed non-small cell lung cancer, squamous cell.  The patient is currently receiving radiation for SVC syndrome from Dr. Sondra Come.  She is already established with Dr. Delton Coombes at the [facility].  I have reviewed this case with our on-call medical oncologist today and there is no urgent need for chemotherapy.  Recommendations: 1.  Continue radiation under the care of Dr. Sondra Come.  The patient does not need to stay in the hospital for completion of radiation.  She may discharge to SNF or home once medically stable as long as she has reliable transportation to and from the cancer center for completion of radiation. 2.  I have spoken with the nurse navigator at the [facility].  We will arrange to have [name] seen by Dr. Delton Coombes the first week of July 2021.  In the meantime, I recommend an MRI of the brain with and without contrast to complete her staging work-up.  This can be done as an inpatient.  She will also need a PET scan once discharged.  This will be arranged by the nurse navigator.  Additionally, the nurse navigator will contact pathology to add PD-L1 and Foundation One testing.  At her  follow-up visit with [facility], treatment options will be discussed.  Mikey Bussing, DNP, AGPCNP-BC, AOCNP Mon/Tues/Thurs/Fri 7am-5pm; Off Wednesdays Cell: [phone]

## 2020-01-16 NOTE — TOC Initial Note (Signed)
Transition of Care [facility]) - Initial/Assessment Note    Patient Details  Name: KYANN HEYDT MRN: [id number] Date of Birth: [birth year]  [facility] of [facility] CM/SW Contact:    Lynnell Catalan, RN Phone Number: 01/16/2020, 2:59 PM  Clinical Narrative:                 This CM spoke with pt at bedside for dc planning. Pt lives at home with disabled son. She receives some support from grandson and neighbors. Pt states that she wants to return home at dc. Pt offered choice for home health services and [facility] chosen. [facility] liaison contacted for referral. Will need MD orders for HHPT/Aide/CSW. Pt states she has a RW at home. This CM attempted to find transportation assistance for pt in [place]. The following information was placed on AVS  (CATS) [employer] [phone].     Expected Discharge Plan: [place] Barriers to Discharge: Continued Medical Work up   Patient Goals and CMS Choice Patient states their goals for this hospitalization and ongoing recovery are:: To get home CMS Medicare.gov Compare Post Acute Care list provided to:: Patient Choice offered to / list presented to : Patient  Expected Discharge Plan and Services Expected Discharge Plan: [place]   Discharge Planning Services: CM Consult Post Acute Care Choice: [place] arrangements for the past 2 months: Single Family Home                           HH Arranged: PT, Nurse's Aide, Social Work [employer] Agency: [place] Date [place]: 01/16/20 Time [place]: [phone] Representative spoke with at [place]: [place] Arrangements/Services Living arrangements for the past 2 months: [place] Lives with:: Adult Children Patient language and need for interpreter reviewed:: Yes Do you feel safe going back to the place where you live?: Yes      Need for Family Participation in Patient Care: Yes  (Comment) Care giver support system in place?: Yes (comment)      Activities of Daily Living Home Assistive Devices/Equipment: [place] Rile (specify type) ADL Screening (condition at time of admission) Patient's cognitive ability adequate to safely complete daily activities?: Yes Is the patient deaf or have difficulty hearing?: No Does the patient have difficulty seeing, even when wearing glasses/contacts?: No Does the patient have difficulty concentrating, remembering, or making decisions?: No Patient able to express need for assistance with ADLs?: Yes Does the patient have difficulty dressing or bathing?: No Independently performs ADLs?: Yes (appropriate for developmental age) Does the patient have difficulty walking or climbing stairs?: Yes Weakness of Legs: Both Weakness of Arms/Hands: None  Permission Sought/Granted Permission sought to share information with : Facility Art therapist granted to share information with : Yes, Verbal Permission Granted     Permission granted to share info w AGENCY: [place]        Emotional Assessment Appearance:: Appears stated age Attitude/Demeanor/Rapport: Engaged Affect (typically observed): Anxious Orientation: : Oriented to Self, Oriented to Place, Oriented to  Time, Oriented to Situation      Admission diagnosis:  Cellulitis of right upper arm [L03.113] Sepsis, due to unspecified organism, unspecified whether acute organ dysfunction present [facility]) [A41.9] Patient Active Problem List   Diagnosis Date Noted  . Cellulitis of right upper arm 01/10/2020  . Right arm cellulitis   . Mass of upper lobe  of right lung   . Sepsis due to undetermined organism ([place]) 12/18/2019  . Delirium tremens ([place])   . Chronic obstructive pulmonary disease ([place])   . Gastroesophageal reflux disease   . Sepsis ([place]) 12/12/2019  . Cecal diverticulitis   . Diarrhea 10/01/2019  . IDA (iron deficiency anemia) 03/27/2019  . Hemorrhoids 03/27/2019   . Rectal bleeding 03/27/2019  . Osteoporosis 02/26/2019  . Special screening for malignant neoplasms, colon   . Pelvic relaxation due to enterocele, vaginal 10/31/2017  . Stroke (cerebrum) ([place]) 03/22/2016  . Tobacco abuse 03/22/2016  . Type 2 diabetes mellitus with hyperlipidemia ([place]) 03/22/2016  . Benign essential HTN 03/22/2016  . Hyperlipidemia 03/22/2016  . Acute left-sided weakness 03/22/2016  . Osteopenia 01/25/2014  . Interstitial lung disease ([place]) 09/13/2011  . Embolism - blood clot   . Infiltrating ductal carcinoma of breast ([place]) 01/16/2011  . Tendonitis   . DEQUERVAIN'S 08/10/2010   PCP:  Rosita Fire, MD Pharmacy:   [place], [place] - [address] [place] [address] Phone: [phone] Fax: [phone]  [facility] - [place], [place] [address] Phone: [phone] Fax: [phone]     Social Determinants of Health (SDOH) Interventions    Readmission Risk Interventions Readmission Risk Prevention Plan 01/14/2020 12/15/2019  Transportation Screening Complete Complete  PCP or Specialist Appt within 5-7 Days - Complete  PCP or Specialist Appt within 3-5 Days Complete -  Medication Review (RN CM) - Complete  HRI or Home Care Consult Complete -  Social Work Consult for Recovery Care Planning/Counseling Complete -  Palliative Care Screening Complete -  Medication Review (RN Care Manager) Complete -  Some recent data might be hidden

## 2020-01-16 NOTE — Progress Notes (Signed)
I emailed pathology and ordered PDL-1 on accession # [id number].

## 2020-01-16 NOTE — Progress Notes (Signed)
Nutrition Follow-up  INTERVENTION:   -Glucerna Shake po TID, each supplement provides 220 kcal and 10 grams of protein -Multivitamin with minerals daily  NUTRITION DIAGNOSIS:   Increased nutrient needs related to acute illness (rt arm cellulitis with sepsis) as evidenced by estimated needs.  Ongoing.  GOAL:   Patient will meet greater than or equal to 90% of their needs  Progressing.  MONITOR:   PO intake, Supplement acceptance, Diet advancement, Labs, Weight trends, Skin, I & O's  ASSESSMENT:   [name] is a 79 y.o. female with medical history significant of HTN, CVA, Intraductal carcinoma right breast with mastectomy in 2010 followed by 5 years of horrmonal therapy with a clean follow up 04/29/19, recent hospitalization for cecal diverticulitis with perforation that did not require surgery or drain placement.   6/12: Pt admitted with rt arm cellulitis with sepsis. 6/14: Transferred to [facility] for radiation therapy  Patient currently consuming 75-100% of meals at this time. Pt is also drinking Glucerna shakes. Currently undergoing radiation for new lung mass.  Admission weight: 138 lbs. No new weights at this time.  Medications: Ferrous sulfate, Multivitamin with minerals daily, Senokot  Labs reviewed: CBGs: 99-204  Diet Order:   Diet Order            DIET SOFT Room service appropriate? Yes; Fluid consistency: Thin  Diet effective now                 EDUCATION NEEDS:   No education needs have been identified at this time  Skin:  Skin Assessment: Skin Integrity Issues: Skin Integrity Issues:: Other (Comment) Other: rt arm cellulitis  Last BM:  6/18 -type 5  Height:   Ht Readings from Last 1 Encounters:  01/10/20 5\' 5"  (1.651 m)    Weight:   Wt Readings from Last 1 Encounters:  01/10/20 63 kg   BMI:  Body mass index is 23.11 kg/m.  Estimated Nutritional Needs:   Kcal:  1600-1800  Protein:  80-95 grams  Fluid:  > 1.6 L  Clayton Bibles, MS,  RD, LDN Inpatient Clinical Dietitian Contact information available via Amion

## 2020-01-17 LAB — CBC
HCT: 31 % — ABNORMAL LOW (ref 36.0–46.0)
Hemoglobin: 10.1 g/dL — ABNORMAL LOW (ref 12.0–15.0)
MCH: 30.4 pg (ref 26.0–34.0)
MCHC: 32.6 g/dL (ref 30.0–36.0)
MCV: 93.4 fL (ref 80.0–100.0)
Platelets: 190 10*3/uL (ref 150–400)
RBC: 3.32 MIL/uL — ABNORMAL LOW (ref 3.87–5.11)
RDW: 16.4 % — ABNORMAL HIGH (ref 11.5–15.5)
WBC: 8.9 10*3/uL (ref 4.0–10.5)
nRBC: 0 % (ref 0.0–0.2)

## 2020-01-17 LAB — BASIC METABOLIC PANEL
Anion gap: 8 (ref 5–15)
BUN: 13 mg/dL (ref 8–23)
CO2: 25 mmol/L (ref 22–32)
Calcium: 8.4 mg/dL — ABNORMAL LOW (ref 8.9–10.3)
Chloride: 105 mmol/L (ref 98–111)
Creatinine, Ser: 0.43 mg/dL — ABNORMAL LOW (ref 0.44–1.00)
GFR calc Af Amer: >60 mL/min (ref >60)
GFR calc non Af Amer: >60 mL/min (ref >60)
Glucose, Bld: 100 mg/dL — ABNORMAL HIGH (ref 70–99)
Potassium: 4.2 mmol/L (ref 3.5–5.1)
Sodium: 138 mmol/L (ref 135–145)

## 2020-01-17 LAB — GLUCOSE, CAPILLARY
Glucose-Capillary: 108 mg/dL — ABNORMAL HIGH (ref 70–99)
Glucose-Capillary: 134 mg/dL — ABNORMAL HIGH (ref 70–99)
Glucose-Capillary: 203 mg/dL — ABNORMAL HIGH (ref 70–99)
Glucose-Capillary: 151 mg/dL — ABNORMAL HIGH (ref 70–99)

## 2020-01-17 LAB — MAGNESIUM: Magnesium: 1.7 mg/dL (ref 1.7–2.4)

## 2020-01-17 NOTE — Progress Notes (Signed)
Physical Therapy Treatment Patient Details Name: [name] MRN: [id number] DOB: 09-06-1940 Today's Date: 01/17/2020    History of Present Illness 79 year old with history of DM2, HLD, breast cancer status postmastectomy, HTN, COPD, tobacco use, recent diverticulitis and cecal perforation treated with antibiotic discharged to SNF presented back to the hospital with complains of worsening abdominal pain, found to have new right upper lobe lung mass, extensive adenopathy with concerning SVC syndrome.  Patient was transferred to [facility] for radiation treatment.  Cultures ended up growing Pasteurella.    PT Comments    Progressing with mobility.    Follow Up Recommendations  Home health PT;Supervision/Assistance - 24 hour     Equipment Recommendations  None recommended by PT    Recommendations for Other Services       Precautions / Restrictions Precautions Precautions: Fall Precaution Comments: visual defictis Restrictions Weight Bearing Restrictions: No    Mobility  Bed Mobility Overal bed mobility: Needs Assistance Bed Mobility: Supine to Sit;Sit to Supine     Supine to sit: Min assist     General bed mobility comments: small amount of assist for trunk. Increased time and moderate reliance on bedrail  Transfers Overall transfer level: Needs assistance Equipment used: Rolling walker (2 wheeled) Transfers: Sit to/from Stand Sit to Stand: Min guard         General transfer comment: Min guard for safety. VCs safety, hand placement  Ambulation/Gait Ambulation/Gait assistance: Min guard;Min assist Gait Distance (Feet): 115 Feet Assistive device: Rolling walker (2 wheeled) Gait Pattern/deviations: Step-through pattern;Decreased step length - right;Decreased step length - left;Decreased stride length     General Gait Details: Intermittent assist to maneuver RW 2* visual deficits. No LOB with RW use. Pt tolerated distance well.   Stairs              Wheelchair Mobility    Modified Rankin (Stroke Patients Only)       Balance                                            Cognition Arousal/Alertness: Awake/alert Behavior During Therapy: WFL for tasks assessed/performed Overall Cognitive Status: Within Functional Limits for tasks assessed                                 General Comments: some mild memory issues      Exercises      General Comments        Pertinent Vitals/Pain Pain Assessment: No/denies pain    Home Living                      Prior Function            PT Goals (current goals can now be found in the care plan section) Progress towards PT goals: Progressing toward goals    Frequency    Min 3X/week      PT Plan Current plan remains appropriate    Co-evaluation              AM-PAC PT "6 Clicks" Mobility   Outcome Measure  Help needed turning from your back to your side while in a flat bed without using bedrails?: A Little Help needed moving from lying on your back to sitting on the side of a flat  bed without using bedrails?: A Little Help needed moving to and from a bed to a chair (including a wheelchair)?: A Little Help needed standing up from a chair using your arms (e.g., wheelchair or bedside chair)?: A Little Help needed to walk in hospital room?: A Little Help needed climbing 3-5 steps with a railing? : A Little 6 Click Score: 18    End of Session Equipment Utilized During Treatment: Gait belt Activity Tolerance: Patient tolerated treatment well Patient left: in bed;with call bell/phone within reach;with bed alarm set   PT Visit Diagnosis: Unsteadiness on feet (R26.81);Difficulty in walking, not elsewhere classified (R26.2)     Time: 2060-1561 PT Time Calculation (min) (ACUTE ONLY): 35 min  Charges:  $Gait Training: 23-37 mins                         Doreatha Massed, PT Acute Rehabilitation  Office: [phone] Pager:  [phone]

## 2020-01-17 NOTE — Progress Notes (Signed)
PROGRESS NOTE    [name]   BTD:[id number]  DOB: 1941/05/21  PCP: Rosita Fire, MD    DOA: 01/10/2020 LOS: 7   Brief Narrative   79 year old with history of DM2, HLD, breast cancer status postmastectomy, HTN, COPD, tobacco use, recent diverticulitis and cecal perforation treated with antibiotic discharged to SNF presented back to the hospital with complains of worsening abdominal pain, found to have new right upper lobe lung mass, extensive adenopathy with concerning SVC syndrome.  Patient was transferred to [facility] for radiation treatment.  Cultures ended up growing Pasteurella. Biopsy showed Poor diff SCC.    Assessment & Plan   Active Problems:   Infiltrating ductal carcinoma of breast (HCC)   Type 2 diabetes mellitus with hyperlipidemia (HCC)   Benign essential HTN   Hyperlipidemia   Sepsis ([place])   Cecal diverticulitis   Mass of upper lobe of right lung   Cellulitis of right upper arm   Sepsis secondary to Pasteurella bacteremia Recent cecal perforation secondary to diverticulitis UTI- UCx= Kluyvera Ascorbata -Culture data reviewed, sepsis physiology improving -Continue IV Unasyn.  Total 10 days planned -CT reports reviewed.  She will need outpatient colonoscopy in the near future-6 to 8 weeks.  Right upper lobe mass with extensive mediastinal adenopathy and early SVC syndrome-slightly worsening High-grade poorly differentiated squamous cell carcinoma Right arm swelling secondary to SVC syndrome -Status post CT-guided biopsy of right upper lung mass 6/14 -Slight subjective improvement of her right upper extremity swelling -Path report-high-grade poorly differentiated squamous cell carcinoma -First radiation treatment 6/15; 6/16 6/17.  Total 14 plan tentatively.  Dr. Sondra Come, radiation oncologist.  -Oncology team aware of diagnosis.   --Further plans to be determined by oncology/radiation team. --MRI brain w/wo is pending, to complete  staging --Continue Decadron   History of breast cancer -Previously treated with mastectomy in 2010, also was on p.o. medications for 5 years.  Follows outpatient with Dr. Jamey Reas.  Oncology team following.  History of COPD with tobacco use -Bronchodilators as needed, incentive spirometer and flutter valve  Diabetes mellitus type 2 -Insulin sliding scale and Accu-Chek  Generalized weakness -PT/OT- recommending home with home health   DVT prophylaxis: enoxaparin (LOVENOX) injection 40 mg Start: 01/16/20 1200   Diet:  Diet Orders (From admission, onward)    Start     Ordered   01/12/20 1414  DIET SOFT Room service appropriate? Yes; Fluid consistency: Thin  Diet effective now       Question Answer Comment  Room service appropriate? Yes   Fluid consistency: Thin      01/12/20 1413            Code Status: DNR    Subjective 01/17/20    Patient reports she had some right shoulder pain earlier, took Tylenol which helped.  Says the right arm swelling feels slightly better today.  Denies chest pain, SOB or other acute complaints.  Discussed MRI for staging.   Disposition Plan & Communication   Status is: Inpatient  Remains inpatient appropriate because:IV treatments appropriate due to intensity of illness or inability to take PO   Dispo: The patient is from: Home              Anticipated d/c is to: Home              Anticipated d/c date is: 3 days              Patient currently is not medically stable to d/c.  Family Communication: none at bedside, will attempt to call    Consults, Procedures, Significant Events   Consultants:   Medical Oncology  Radiation Oncology  Procedures:   Radiation therapy  Antimicrobials:   Unasyn    Objective   Vitals:   01/16/20 1224 01/16/20 1957 01/16/20 2137 01/17/20 0606  BP: 131/69  (!) 142/61 (!) 143/63  Pulse: 78  75 72  Resp: 16  20 18   Temp: 97.8 F (36.6 C)  98 F (36.7 C) 98.1 F (36.7 C)  TempSrc:  Oral  Oral Oral  SpO2: 90% 96% 94% 95%  Weight:      Height:        Intake/Output Summary (Last 24 hours) at 01/17/2020 0806 Last data filed at 01/17/2020 0558 Gross per 24 hour  Intake 537 ml  Output 400 ml  Net 137 ml   Filed Weights   01/10/20 1303  Weight: 63 kg    Physical Exam:  General exam: awake, alert, no acute distress HEENT: moist mucus membranes, hearing grossly normal  Respiratory system: mildly diminished on right, otherwise CTAB, no wheezes, rales or rhonchi, normal respiratory effort. Cardiovascular system: normal S1/S2, RRR, no pedal edema.   Gastrointestinal system: soft, NT, ND, +bowel sounds. Central nervous system: A&O x4. no gross focal neurologic deficits, normal speech Extremities: right upper extremity pitting edema, moves all, normal tone Psychiatry: normal mood, congruent affect, judgement and insight appear normal  Labs   Data Reviewed: I have personally reviewed following labs and imaging studies  CBC: Recent Labs  Lab 01/10/20 1315 01/10/20 1315 01/12/20 0458 01/14/20 0617 01/15/20 0547 01/16/20 0539 01/17/20 0654  WBC 19.0*   < > 6.9 6.7 7.0 8.1 8.9  NEUTROABS 17.4*  --   --   --   --   --   --   HGB 12.2   < > 9.9* 10.2* 10.2* 10.2* 10.1*  HCT 38.1   < > 31.3* 32.1* 31.0* 32.3* 31.0*  MCV 93.8   < > 95.7 96.1 93.1 93.4 93.4  PLT 177   < > 148* 180 163 198 190   < > = values in this interval not displayed.   Basic Metabolic Panel: Recent Labs  Lab 01/12/20 0458 01/14/20 0617 01/15/20 0547 01/16/20 0539 01/17/20 0654  NA 139 139 137 137 138  K 4.0 3.8 4.1 3.8 4.2  CL 107 107 103 105 105  CO2 24 25 25 27 25   GLUCOSE 137* 103* 104* 121* 100*  BUN 5* 9 13 12 13   CREATININE 0.64 0.41* 0.49 0.48 0.43*  CALCIUM 8.4* 8.2* 8.1* 7.8* 8.4*  MG  --  1.8 1.7 1.8 1.7   GFR: Estimated Creatinine Clearance: 50.5 mL/min (A) (by C-G formula based on SCr of 0.43 mg/dL (L)). Liver Function Tests: Recent Labs  Lab 01/10/20 1315  01/12/20 0458  AST 13* 12*  ALT 9 8  ALKPHOS 43 32*  BILITOT 0.9 0.9  PROT 7.0 5.4*  ALBUMIN 3.4* 2.3*   No results for input(s): LIPASE, AMYLASE in the last 168 hours. No results for input(s): AMMONIA in the last 168 hours. Coagulation Profile: Recent Labs  Lab 01/10/20 1315  INR 1.0   Cardiac Enzymes: No results for input(s): CKTOTAL, CKMB, CKMBINDEX, TROPONINI in the last 168 hours. BNP (last 3 results) No results for input(s): PROBNP in the last 8760 hours. HbA1C: No results for input(s): HGBA1C in the last 72 hours. CBG: Recent Labs  Lab 01/16/20 0733 01/16/20 1144 01/16/20 1641 01/16/20 2140  01/17/20 0752  GLUCAP 99 90 216* 202* 108*   Lipid Profile: No results for input(s): CHOL, HDL, LDLCALC, TRIG, CHOLHDL, LDLDIRECT in the last 72 hours. Thyroid Function Tests: No results for input(s): TSH, T4TOTAL, FREET4, T3FREE, THYROIDAB in the last 72 hours. Anemia Panel: No results for input(s): VITAMINB12, FOLATE, FERRITIN, TIBC, IRON, RETICCTPCT in the last 72 hours. Sepsis Labs: Recent Labs  Lab 01/10/20 1315  LATICACIDVEN 1.6    Recent Results (from the past 240 hour(s))  Blood Culture (routine x 2)     Status: Abnormal   Collection Time: 01/10/20  1:15 PM   Specimen: BLOOD  Result Value Ref Range Status   Specimen Description   Final    BLOOD LEFT ANTECUBITAL Performed at [facility], [place] [address], [place], [place] [address]    Special Requests   Final    BOTTLES DRAWN AEROBIC AND ANAEROBIC Blood Culture adequate volume Performed at [facility], [address], [place], [place] [address]    Culture  Setup Time   Final    GRAM NEGATIVE RODS Gram Stain Report Called to,Read Back By and Verified With: SAPPELT,J @ 2542 ON 01/11/20 BY JUW AEROBIC BOTTLE ONLY GS DONE @ [facility] GRAM NEGATIVE RODS ANAEROBIC BOTTLE ONLY Gram Stain Report Called to,Read Back By and Verified With: PREVIOUSLY CALLED @511  01/11/2020 KAY CRITICAL RESULT CALLED TO, READ BACK  BY AND VERIFIED WITH: PHARMD MIKE M [phone] [id number] FCP    Culture (A)  Final    PASTEURELLA MULTOCIDA Usually susceptible to penicillin and other beta lactam agents,quinolones,macrolides and tetracyclines. Performed at [facility], [place] [address] [place], [place] [address]    Report Status 01/13/2020 FINAL  Final  Blood Culture ID Panel (Reflexed)     Status: None   Collection Time: 01/10/20  1:15 PM  Result Value Ref Range Status   Enterococcus species NOT DETECTED NOT DETECTED Final   Listeria monocytogenes NOT DETECTED NOT DETECTED Final   Staphylococcus species NOT DETECTED NOT DETECTED Final   Staphylococcus aureus (BCID) NOT DETECTED NOT DETECTED Final   Streptococcus species NOT DETECTED NOT DETECTED Final   Streptococcus agalactiae NOT DETECTED NOT DETECTED Final   Streptococcus pneumoniae NOT DETECTED NOT DETECTED Final   Streptococcus pyogenes NOT DETECTED NOT DETECTED Final   Acinetobacter baumannii NOT DETECTED NOT DETECTED Final   Enterobacteriaceae species NOT DETECTED NOT DETECTED Final   Enterobacter cloacae complex NOT DETECTED NOT DETECTED Final   Escherichia coli NOT DETECTED NOT DETECTED Final   Klebsiella oxytoca NOT DETECTED NOT DETECTED Final   Klebsiella pneumoniae NOT DETECTED NOT DETECTED Final   Proteus species NOT DETECTED NOT DETECTED Final   Serratia marcescens NOT DETECTED NOT DETECTED Final   Haemophilus influenzae NOT DETECTED NOT DETECTED Final   Neisseria meningitidis NOT DETECTED NOT DETECTED Final   Pseudomonas aeruginosa NOT DETECTED NOT DETECTED Final   Candida albicans NOT DETECTED NOT DETECTED Final   Candida glabrata NOT DETECTED NOT DETECTED Final   Candida krusei NOT DETECTED NOT DETECTED Final   Candida parapsilosis NOT DETECTED NOT DETECTED Final   Candida tropicalis NOT DETECTED NOT DETECTED Final    Comment: Performed at [facility], [place] [address], [place],  [address]  Blood Culture (routine x 2)     Status: None    Collection Time: 01/10/20  1:47 PM   Specimen: BLOOD LEFT ARM  Result Value Ref Range Status   Specimen Description BLOOD LEFT ARM BOTTLES DRAWN AEROBIC AND ANAEROBIC  Final   Special Requests Blood Culture adequate volume  Final   Culture   Final    NO GROWTH 5 DAYS Performed at [facility], [address], [place], [place] [address]    Report Status 01/15/2020 FINAL  Final  Urine culture     Status: Abnormal   Collection Time: 01/10/20  2:00 PM   Specimen: Urine, Catheterized  Result Value Ref Range Status   Specimen Description   Final    URINE, CATHETERIZED Performed at [facility], [address], [place], [place] [address]    Special Requests   Final    NONE Performed at [facility], [address], [place], [place] [address]    Culture >=100,000 COLONIES/mL Humberto Seals (A)  Final   Report Status 01/13/2020 FINAL  Final   Organism ID, Bacteria KLUYVERA ASCORBATA (A)  Final      Susceptibility   Kluyvera ascorbata - MIC*    AMPICILLIN >=32 RESISTANT Resistant     CEFAZOLIN >=64 RESISTANT Resistant     CEFTRIAXONE <=1 SENSITIVE Sensitive     CIPROFLOXACIN <=0.25 SENSITIVE Sensitive     GENTAMICIN <=1 SENSITIVE Sensitive     IMIPENEM <=0.25 SENSITIVE Sensitive     NITROFURANTOIN 64 INTERMEDIATE Intermediate     TRIMETH/SULFA <=20 SENSITIVE Sensitive     AMPICILLIN/SULBACTAM <=2 SENSITIVE Sensitive     * >=100,000 COLONIES/mL KLUYVERA ASCORBATA  SARS Coronavirus 2 by RT PCR (hospital order, performed in [place] hospital lab) Nasopharyngeal Nasopharyngeal Swab     Status: None   Collection Time: 01/10/20  2:00 PM   Specimen: Nasopharyngeal Swab  Result Value Ref Range Status   SARS Coronavirus 2 NEGATIVE NEGATIVE Final    Comment: (NOTE) SARS-CoV-2 target nucleic acids are NOT DETECTED.  The SARS-CoV-2 RNA is generally detectable in upper and lower respiratory specimens during the acute phase of infection. The lowest concentration of SARS-CoV-2 viral  copies this assay can detect is 250 copies / mL. A negative result does not preclude SARS-CoV-2 infection and should not be used as the sole basis for treatment or other patient management decisions.  A negative result may occur with improper specimen collection / handling, submission of specimen other than nasopharyngeal swab, presence of viral mutation(s) within the areas targeted by this assay, and inadequate number of viral copies (<250 copies / mL). A negative result must be combined with clinical observations, patient history, and epidemiological information.  Fact Sheet for Patients:   StrictlyIdeas.no  Fact Sheet for Healthcare Providers: BankingDealers.co.za  This test is not yet approved or  cleared by the [place] FDA and has been authorized for detection and/or diagnosis of SARS-CoV-2 by FDA under an Emergency Use Authorization (EUA).  This EUA will remain in effect (meaning this test can be used) for the duration of the COVID-19 declaration under Section 564(b)(1) of the Act, 21 U.S.C. section 360bbb-3(b)(1), unless the authorization is terminated or revoked sooner.  Performed at [facility], [address], [place],  [address]       Imaging Studies   No results found.   Medications   Scheduled Meds: . dexamethasone (DECADRON) injection  4 mg Intravenous Q24H  . enoxaparin (LOVENOX) injection  40 mg Subcutaneous Q24H  . feeding supplement (GLUCERNA SHAKE)  237 mL Oral TID BM  . ferrous sulfate  325 mg Oral Q breakfast  . gabapentin  600 mg Oral TID  . insulin aspart  0-15 Units Subcutaneous TID WC  . metoprolol tartrate  12.5 mg  Oral BID  . mometasone-formoterol  2 puff Inhalation BID  . multivitamin with minerals  1 tablet Oral Daily  . pantoprazole  40 mg Oral Daily  . pravastatin  40 mg Oral q1800  . QUEtiapine  12.5 mg Oral QHS  . senna  1 tablet Oral BID   Continuous Infusions: .  ampicillin-sulbactam (UNASYN) IV 3 g (01/17/20 0558)       LOS: 7 days    Time spent: 30 minutes    Ezekiel Slocumb, DO [facility]  01/17/2020, 8:06 AM    If 7PM-7AM, please contact night-coverage. How to contact the [facility] Attending or Consulting provider [place] or covering provider during after hours [place], for this patient?    1. Check the care team in [facility] and look for a) attending/consulting [facility] provider listed and b) the [facility] team listed 2. Log into [email or web address] and use [facility]'s universal password to access. If you do not have the password, please contact the hospital operator. 3. Locate the [facility] provider you are looking for under [facility] and page to a number that you can be directly reached. 4. If you still have difficulty reaching the provider, please page the [facility] (Director on Call) for the Hospitalists listed on amion for assistance.

## 2020-01-18 LAB — CBC
HCT: 31.2 % — ABNORMAL LOW (ref 36.0–46.0)
Hemoglobin: 9.9 g/dL — ABNORMAL LOW (ref 12.0–15.0)
MCH: 30.2 pg (ref 26.0–34.0)
MCHC: 31.7 g/dL (ref 30.0–36.0)
MCV: 95.1 fL (ref 80.0–100.0)
Platelets: 188 10*3/uL (ref 150–400)
RBC: 3.28 MIL/uL — ABNORMAL LOW (ref 3.87–5.11)
RDW: 16.8 % — ABNORMAL HIGH (ref 11.5–15.5)
WBC: 8.4 10*3/uL (ref 4.0–10.5)
nRBC: 0 % (ref 0.0–0.2)

## 2020-01-18 LAB — BASIC METABOLIC PANEL
Anion gap: 5 (ref 5–15)
BUN: 12 mg/dL (ref 8–23)
CO2: 26 mmol/L (ref 22–32)
Calcium: 8 mg/dL — ABNORMAL LOW (ref 8.9–10.3)
Chloride: 104 mmol/L (ref 98–111)
Creatinine, Ser: 0.52 mg/dL (ref 0.44–1.00)
GFR calc Af Amer: >60 mL/min (ref >60)
GFR calc non Af Amer: >60 mL/min (ref >60)
Glucose, Bld: 95 mg/dL (ref 70–99)
Potassium: 3.8 mmol/L (ref 3.5–5.1)
Sodium: 135 mmol/L (ref 135–145)

## 2020-01-18 LAB — GLUCOSE, CAPILLARY
Glucose-Capillary: 83 mg/dL (ref 70–99)
Glucose-Capillary: 119 mg/dL — ABNORMAL HIGH (ref 70–99)
Glucose-Capillary: 234 mg/dL — ABNORMAL HIGH (ref 70–99)
Glucose-Capillary: 200 mg/dL — ABNORMAL HIGH (ref 70–99)

## 2020-01-18 LAB — MAGNESIUM: Magnesium: 1.8 mg/dL (ref 1.7–2.4)

## 2020-01-18 NOTE — Progress Notes (Addendum)
PROGRESS NOTE    [name]   OQH:[id number]  DOB: [birth year]  PCP: Rosita Fire, MD    DOA: 01/10/2020 LOS: 6   Brief Narrative   79 year old with history of DM2, HLD, breast cancer status postmastectomy, HTN, COPD, tobacco use, recent diverticulitis and cecal perforation treated with antibiotic discharged to SNF presented back to the hospital with complains of worsening abdominal pain, found to have new right upper lobe lung mass, extensive adenopathy with concerning SVC syndrome.  Patient was transferred to [facility] for radiation treatment.  Biopsy showed poorly differentiated SCC.   Blood cultures grew Pasteurella, on Unasyn.    Assessment & Plan   Active Problems:   Infiltrating ductal carcinoma of breast (HCC)   Type 2 diabetes mellitus with hyperlipidemia (HCC)   Benign essential HTN   Hyperlipidemia   Sepsis ([place])   Cecal diverticulitis   Mass of upper lobe of right lung   Cellulitis of right upper arm   Sepsis secondary to Pasteurella bacteremia Recent cecal perforation secondary to diverticulitis UTI- UCx= Kluyvera Ascorbata -Culture data reviewed, sepsis physiology improving -Continue IV Unasyn.  Total 10 days planned. -CT reports reviewed.  She will need outpatient colonoscopy in the near future-6 to 8 weeks.  Right upper lobe mass with extensive mediastinal adenopathy and early SVC syndrome-slightly worsening High-grade poorly differentiated squamous cell carcinoma Right arm swelling secondary to SVC syndrome -Status post CT-guided biopsy of right upper lung mass 6/14 -Some improvement of her right upper extremity swelling -Path report-high-grade poorly differentiated squamous cell carcinoma -First radiation treatment 6/15; 6/16 6/17.  Total 14 plan tentatively.  Dr. Sondra Come, radiation oncologist.  -Oncology team aware of diagnosis.   --Further plans to be determined by oncology/radiation team. --MRI brain w/wo is pending, to complete  staging --Continue Decadron   History of breast cancer -Previously treated with mastectomy in 2010, also was on p.o. medications for 5 years.  Follows outpatient with Dr. Jamey Reas.  Oncology team following.  History of COPD with tobacco use -Bronchodilators as needed, incentive spirometer and flutter valve  Diabetes mellitus type 2 -Insulin sliding scale and Accu-Chek  Generalized weakness -PT/OT- recommending home with home health   DVT prophylaxis: enoxaparin (LOVENOX) injection 40 mg Start: 01/16/20 1200   Diet:  Diet Orders (From admission, onward)    Start     Ordered   01/12/20 1414  DIET SOFT Room service appropriate? Yes; Fluid consistency: Thin  Diet effective now       Question Answer Comment  Room service appropriate? Yes   Fluid consistency: Thin      01/12/20 1413            Code Status: DNR    Subjective 01/18/20    Patient seen this AM.  She reports feeling okay, enjoying her lunch.  Reports her right arm was itchy last night, but has resolved.  Feels like the right arm is less swollen.  Otherwise denies complaints including fever/chils, shortness of breath, chest pain, n/v/d.   Disposition Plan & Communication   Status is: Inpatient  Remains inpatient appropriate because:IV treatments appropriate due to intensity of illness or inability to take PO   Dispo: The patient is from: Home              Anticipated d/c is to: Home              Anticipated d/c date is: 3 days  Patient currently is not medically stable to d/c.   Family Communication: grandson [name] updated by phone this afternoon.  He would like a call from social work to discuss options for transportation to radiation and home health plans.    Consults, Procedures, Significant Events   Consultants:   Medical Oncology  Radiation Oncology  Procedures:   Radiation therapy  Antimicrobials:   Unasyn    Objective   Vitals:   01/17/20 1325 01/17/20 2044  01/17/20 2101 01/18/20 0633  BP: 108/70  (!) 147/71 (!) 150/64  Pulse: 86  74 74  Resp: 14  18 16   Temp: 98.4 F (36.9 C)  97.8 F (36.6 C) 97.6 F (36.4 C)  TempSrc: Oral  Oral Oral  SpO2: 94% 94% 95% 96%  Weight:      Height:        Intake/Output Summary (Last 24 hours) at 01/18/2020 0851 Last data filed at 01/18/2020 0700 Gross per 24 hour  Intake 2777 ml  Output 2400 ml  Net 377 ml   Filed Weights   01/10/20 1303  Weight: 63 kg    Physical Exam:  General exam: awake, alert, no acute distress HEENT: moist mucus membranes, hearing grossly normal  Respiratory system: mildly diminished on right, otherwise CTAB, no wheezes, rales or rhonchi, normal respiratory effort. Cardiovascular system: normal S1/S2, RRR, no pedal edema.   Gastrointestinal system: soft, NT, ND, +bowel sounds. Central nervous system: A&O x4. no gross focal neurologic deficits, normal speech Extremities: right upper extremity pitting edema has improved somewhat, moves all, normal tone Psychiatry: normal mood, congruent affect, judgement and insight appear normal  Labs   Data Reviewed: I have personally reviewed following labs and imaging studies  CBC: Recent Labs  Lab 01/14/20 0617 01/15/20 0547 01/16/20 0539 01/17/20 0654 01/18/20 0705  WBC 6.7 7.0 8.1 8.9 8.4  HGB 10.2* 10.2* 10.2* 10.1* 9.9*  HCT 32.1* 31.0* 32.3* 31.0* 31.2*  MCV 96.1 93.1 93.4 93.4 95.1  PLT 180 163 198 190 378   Basic Metabolic Panel: Recent Labs  Lab 01/14/20 0617 01/15/20 0547 01/16/20 0539 01/17/20 0654 01/18/20 0705  NA 139 137 137 138 135  K 3.8 4.1 3.8 4.2 3.8  CL 107 103 105 105 104  CO2 25 25 27 25 26   GLUCOSE 103* 104* 121* 100* 95  BUN 9 13 12 13 12   CREATININE 0.41* 0.49 0.48 0.43* 0.52  CALCIUM 8.2* 8.1* 7.8* 8.4* 8.0*  MG 1.8 1.7 1.8 1.7 1.8   GFR: Estimated Creatinine Clearance: 50.5 mL/min (by C-G formula based on SCr of 0.52 mg/dL). Liver Function Tests: Recent Labs  Lab 01/12/20 0458   AST 12*  ALT 8  ALKPHOS 32*  BILITOT 0.9  PROT 5.4*  ALBUMIN 2.3*   No results for input(s): LIPASE, AMYLASE in the last 168 hours. No results for input(s): AMMONIA in the last 168 hours. Coagulation Profile: No results for input(s): INR, PROTIME in the last 168 hours. Cardiac Enzymes: No results for input(s): CKTOTAL, CKMB, CKMBINDEX, TROPONINI in the last 168 hours. BNP (last 3 results) No results for input(s): PROBNP in the last 8760 hours. HbA1C: No results for input(s): HGBA1C in the last 72 hours. CBG: Recent Labs  Lab 01/16/20 2140 01/17/20 0752 01/17/20 1138 01/17/20 1636 01/17/20 2152  GLUCAP 202* 108* 134* 203* 151*   Lipid Profile: No results for input(s): CHOL, HDL, LDLCALC, TRIG, CHOLHDL, LDLDIRECT in the last 72 hours. Thyroid Function Tests: No results for input(s): TSH, T4TOTAL, FREET4, T3FREE, THYROIDAB  in the last 72 hours. Anemia Panel: No results for input(s): VITAMINB12, FOLATE, FERRITIN, TIBC, IRON, RETICCTPCT in the last 72 hours. Sepsis Labs: No results for input(s): PROCALCITON, LATICACIDVEN in the last 168 hours.  Recent Results (from the past 240 hour(s))  Blood Culture (routine x 2)     Status: Abnormal   Collection Time: 01/10/20  1:15 PM   Specimen: BLOOD  Result Value Ref Range Status   Specimen Description   Final    BLOOD LEFT ANTECUBITAL Performed at [facility], [place] [address] [place], [place] [address]    Special Requests   Final    BOTTLES DRAWN AEROBIC AND ANAEROBIC Blood Culture adequate volume Performed at [facility], [address] [place], [place] [address]    Culture  Setup Time   Final    GRAM NEGATIVE RODS Gram Stain Report Called to,Read Back By and Verified With: SAPPELT,J @ 4709 ON 01/11/20 BY JUW AEROBIC BOTTLE ONLY GS DONE @ [facility] GRAM NEGATIVE RODS ANAEROBIC BOTTLE ONLY Gram Stain Report Called to,Read Back By and Verified With: PREVIOUSLY CALLED @511  01/11/2020 KAY CRITICAL RESULT CALLED TO, READ BACK  BY AND VERIFIED WITH: PHARMD MIKE M [phone] [id number] FCP    Culture (A)  Final    PASTEURELLA MULTOCIDA Usually susceptible to penicillin and other beta lactam agents,quinolones,macrolides and tetracyclines. Performed at [facility], [place] [address], [place], [place] [address]    Report Status 01/13/2020 FINAL  Final  Blood Culture ID Panel (Reflexed)     Status: None   Collection Time: 01/10/20  1:15 PM  Result Value Ref Range Status   Enterococcus species NOT DETECTED NOT DETECTED Final   Listeria monocytogenes NOT DETECTED NOT DETECTED Final   Staphylococcus species NOT DETECTED NOT DETECTED Final   Staphylococcus aureus (BCID) NOT DETECTED NOT DETECTED Final   Streptococcus species NOT DETECTED NOT DETECTED Final   Streptococcus agalactiae NOT DETECTED NOT DETECTED Final   Streptococcus pneumoniae NOT DETECTED NOT DETECTED Final   Streptococcus pyogenes NOT DETECTED NOT DETECTED Final   Acinetobacter baumannii NOT DETECTED NOT DETECTED Final   Enterobacteriaceae species NOT DETECTED NOT DETECTED Final   Enterobacter cloacae complex NOT DETECTED NOT DETECTED Final   Escherichia coli NOT DETECTED NOT DETECTED Final   Klebsiella oxytoca NOT DETECTED NOT DETECTED Final   Klebsiella pneumoniae NOT DETECTED NOT DETECTED Final   Proteus species NOT DETECTED NOT DETECTED Final   Serratia marcescens NOT DETECTED NOT DETECTED Final   Haemophilus influenzae NOT DETECTED NOT DETECTED Final   Neisseria meningitidis NOT DETECTED NOT DETECTED Final   Pseudomonas aeruginosa NOT DETECTED NOT DETECTED Final   Candida albicans NOT DETECTED NOT DETECTED Final   Candida glabrata NOT DETECTED NOT DETECTED Final   Candida krusei NOT DETECTED NOT DETECTED Final   Candida parapsilosis NOT DETECTED NOT DETECTED Final   Candida tropicalis NOT DETECTED NOT DETECTED Final    Comment: Performed at [facility], [place] [address], [place], [place] [address]  Blood Culture (routine x 2)     Status: None    Collection Time: 01/10/20  1:47 PM   Specimen: BLOOD LEFT ARM  Result Value Ref Range Status   Specimen Description BLOOD LEFT ARM BOTTLES DRAWN AEROBIC AND ANAEROBIC  Final   Special Requests Blood Culture adequate volume  Final   Culture   Final    NO GROWTH 5 DAYS Performed at [facility], [address] [place],  [address]    Report Status  01/15/2020 FINAL  Final  Urine culture     Status: Abnormal   Collection Time: 01/10/20  2:00 PM   Specimen: Urine, Catheterized  Result Value Ref Range Status   Specimen Description   Final    URINE, CATHETERIZED Performed at [facility], [address], [place], [place] [address]    Special Requests   Final    NONE Performed at [facility], [address], [place], [place] [address]    Culture >=100,000 COLONIES/mL Humberto Seals (A)  Final   Report Status 01/13/2020 FINAL  Final   Organism ID, Bacteria KLUYVERA ASCORBATA (A)  Final      Susceptibility   Kluyvera ascorbata - MIC*    AMPICILLIN >=32 RESISTANT Resistant     CEFAZOLIN >=64 RESISTANT Resistant     CEFTRIAXONE <=1 SENSITIVE Sensitive     CIPROFLOXACIN <=0.25 SENSITIVE Sensitive     GENTAMICIN <=1 SENSITIVE Sensitive     IMIPENEM <=0.25 SENSITIVE Sensitive     NITROFURANTOIN 64 INTERMEDIATE Intermediate     TRIMETH/SULFA <=20 SENSITIVE Sensitive     AMPICILLIN/SULBACTAM <=2 SENSITIVE Sensitive     * >=100,000 COLONIES/mL KLUYVERA ASCORBATA  SARS Coronavirus 2 by RT PCR (hospital order, performed in [place] hospital lab) Nasopharyngeal Nasopharyngeal Swab     Status: None   Collection Time: 01/10/20  2:00 PM   Specimen: Nasopharyngeal Swab  Result Value Ref Range Status   SARS Coronavirus 2 NEGATIVE NEGATIVE Final    Comment: (NOTE) SARS-CoV-2 target nucleic acids are NOT DETECTED.  The SARS-CoV-2 RNA is generally detectable in upper and lower respiratory specimens during the acute phase of infection. The lowest concentration of SARS-CoV-2 viral  copies this assay can detect is 250 copies / mL. A negative result does not preclude SARS-CoV-2 infection and should not be used as the sole basis for treatment or other patient management decisions.  A negative result may occur with improper specimen collection / handling, submission of specimen other than nasopharyngeal swab, presence of viral mutation(s) within the areas targeted by this assay, and inadequate number of viral copies (<250 copies / mL). A negative result must be combined with clinical observations, patient history, and epidemiological information.  Fact Sheet for Patients:   StrictlyIdeas.no  Fact Sheet for Healthcare Providers: BankingDealers.co.za  This test is not yet approved or  cleared by the [place] FDA and has been authorized for detection and/or diagnosis of SARS-CoV-2 by FDA under an Emergency Use Authorization (EUA).  This EUA will remain in effect (meaning this test can be used) for the duration of the COVID-19 declaration under Section 564(b)(1) of the Act, 21 U.S.C. section 360bbb-3(b)(1), unless the authorization is terminated or revoked sooner.  Performed at [facility], [address] [place], [place] [address]       Imaging Studies   No results found.   Medications   Scheduled Meds: . dexamethasone (DECADRON) injection  4 mg Intravenous Q24H  . enoxaparin (LOVENOX) injection  40 mg Subcutaneous Q24H  . feeding supplement (GLUCERNA SHAKE)  237 mL Oral TID BM  . ferrous sulfate  325 mg Oral Q breakfast  . gabapentin  600 mg Oral TID  . insulin aspart  0-15 Units Subcutaneous TID WC  . metoprolol tartrate  12.5 mg Oral BID  . mometasone-formoterol  2 puff Inhalation BID  . multivitamin with minerals  1 tablet Oral Daily  . pantoprazole  40 mg Oral Daily  . pravastatin  40 mg Oral q1800  . QUEtiapine  12.5 mg Oral QHS  . senna  1 tablet Oral BID   Continuous Infusions: .  ampicillin-sulbactam (UNASYN) IV 3 g (01/18/20 0508)       LOS: 8 days    Time spent: 25 minutes    Ezekiel Slocumb, DO [facility]  01/18/2020, 8:51 AM    If 7PM-7AM, please contact night-coverage. How to contact the [facility] Attending or Consulting provider [place] or covering provider during after hours [place], for this patient?    1. Check the care team in [facility] and look for a) attending/consulting [facility] provider listed and b) the [facility] team listed 2. Log into [email or web address] and use [place]'s universal password to access. If you do not have the password, please contact the hospital operator. 3. Locate the [facility] provider you are looking for under [facility] and page to a number that you can be directly reached. 4. If you still have difficulty reaching the provider, please page the [facility] (Director on Call) for the Hospitalists listed on amion for assistance.

## 2020-01-19 ENCOUNTER — Ambulatory Visit
Admit: 2020-01-19 | Discharge: 2020-01-19 | Disposition: A | Discharge status: Home / Self Care | Payer: Medicare HMO | Attending: Radiation Oncology | Admitting: Radiation Oncology

## 2020-01-19 ENCOUNTER — Ambulatory Visit: Payer: Medicare HMO

## 2020-01-19 ENCOUNTER — Inpatient Hospital Stay (HOSPITAL_COMMUNITY): Payer: Medicare HMO

## 2020-01-19 LAB — GLUCOSE, CAPILLARY
Glucose-Capillary: 101 mg/dL — ABNORMAL HIGH (ref 70–99)
Glucose-Capillary: 235 mg/dL — ABNORMAL HIGH (ref 70–99)
Glucose-Capillary: 136 mg/dL — ABNORMAL HIGH (ref 70–99)

## 2020-01-19 MED ORDER — GADOBUTROL 1 MMOL/ML IV SOLN
6.0000 mL | Freq: Once | INTRAVENOUS | Status: AC | PRN
  Administered 2020-01-19: 6 mL via INTRAVENOUS

## 2020-01-19 NOTE — Progress Notes (Signed)
PROGRESS NOTE    [name]   XBD:[id number]  DOB: November 17, 1940  PCP: Lauren Fire, MD    DOA: 01/10/2020 LOS: 27   Brief Narrative   79 year old with history of DM2, HLD, breast cancer status postmastectomy, HTN, COPD, tobacco use, recent diverticulitis and cecal perforation treated with antibiotic discharged to SNF presented back to the hospital with complains of worsening abdominal pain, found to have new right upper lobe lung mass, extensive adenopathy with concerning SVC syndrome.  Patient was transferred to [facility] for radiation treatment.  Biopsy showed poorly differentiated SCC.   Blood cultures grew Pasteurella, on Unasyn.    Assessment & Plan    Sepsis secondary to Pasteurella bacteremia UTI- UCx= Kluyvera Ascorbata Culture data reviewed.  Bacteremia noted only in 1 set of blood cultures.  Culture report also reviewed.  Patient will complete 10 days of IV Unasyn tomorrow.    Intra-abdominal mass associated with the cecum  Recent cecal perforation secondary to diverticulitis. She will need outpatient colonoscopy in the near future-6 to 8 weeks.  Right upper lobe mass with extensive mediastinal adenopathy and early SVC syndrome-slightly worsening High-grade poorly differentiated squamous cell carcinoma Right arm swelling secondary to SVC syndrome -Status post CT-guided biopsy of right upper lung mass 6/14 -Path report-high-grade poorly differentiated squamous cell carcinoma -She is currently getting radiation treatment.  She had simulation on 6/15.  First treatment on 6/16.  She will need 14 treatments.  Dr. Sondra [name], radiation oncologist.  --Further plans to be determined by oncology/radiation team. --MRI brain w/wo is pending, to complete staging --Continue Decadron   History of breast cancer -Previously treated with mastectomy in 2010, also was on p.o. medications for 5 years.  Follows outpatient with Dr. Jamey [name].  Oncology team following.  History  of COPD with tobacco use -Bronchodilators as needed, incentive spirometer and flutter valve  Diabetes mellitus type 2 -Insulin sliding scale and Accu-Chek  Normocytic anemia Stable hemoglobin.  No evidence of overt bleeding.  Generalized weakness -PT/OT- recommending home with home health   DVT prophylaxis: enoxaparin (LOVENOX) injection 40 mg Start: 01/16/20 1200   Diet:  Diet Orders (From admission, onward)    Start     Ordered   01/12/20 1414  DIET SOFT Room service appropriate? Yes; Fluid consistency: Thin  Diet effective now       Question Answer Comment  Room service appropriate? Yes   Fluid consistency: Thin      01/12/20 1413            Code Status: DNR    Subjective 01/19/20    Patient states that she is feeling well.  The right arm swelling seems to be slightly better.  Denies any nausea vomiting.  No shortness of breath.   Disposition Plan & Communication   Status is: Inpatient  Remains inpatient appropriate because:Ongoing diagnostic testing needed not appropriate for outpatient work up, Unsafe d/c plan and Inpatient level of care appropriate due to severity of illness   Dispo: The patient is from: Home              Anticipated d/c is to: Home              Anticipated d/c date is: 2 days              Patient currently is not medically stable to d/c.    Family Communication: No family at bedside.  Will update grandson later today.   Consults, Procedures, Significant Events  Consultants:   Medical Oncology  Radiation Oncology  Procedures:   Radiation therapy  Antimicrobials:   Unasyn    Objective   Vitals:   01/18/20 2107 01/19/20 0651 01/19/20 0748 01/19/20 1301  BP: (!) 129/93 (!) 141/65  109/61  Pulse: 85 70 72 80  Resp: 15 16 16 16   Temp: 98.7 F (37.1 C) 98.3 F (36.8 C)  98.4 F (36.9 C)  TempSrc: Oral Oral  Oral  SpO2: 95% 94% 95% 96%  Weight:      Height:        Intake/Output Summary (Last 24 hours) at  01/19/2020 1439 Last data filed at 01/19/2020 0913 Gross per 24 hour  Intake 697 ml  Output 650 ml  Net 47 ml   Filed Weights   01/10/20 1303  Weight: 63 kg    Physical Exam:  General appearance: Awake alert.  In no distress Resp: Clear to auscultation bilaterally.  Normal effort Cardio: S1-S2 is normal regular.  No S3-S4.  No rubs murmurs or bruit GI: Abdomen is soft.  Nontender nondistended.  Bowel sounds are present normal.  No masses organomegaly Extremities: No edema.  Full range of motion of lower extremities. Neurologic:   No focal neurological deficits.     Labs   Data Reviewed: I have personally reviewed following labs and imaging studies  CBC: Recent Labs  Lab 01/14/20 0617 01/15/20 0547 01/16/20 0539 01/17/20 0654 01/18/20 0705  WBC 6.7 7.0 8.1 8.9 8.4  HGB 10.2* 10.2* 10.2* 10.1* 9.9*  HCT 32.1* 31.0* 32.3* 31.0* 31.2*  MCV 96.1 93.1 93.4 93.4 95.1  PLT 180 163 198 190 017   Basic Metabolic Panel: Recent Labs  Lab 01/14/20 0617 01/15/20 0547 01/16/20 0539 01/17/20 0654 01/18/20 0705  NA 139 137 137 138 135  K 3.8 4.1 3.8 4.2 3.8  CL 107 103 105 105 104  CO2 25 25 27 25 26   GLUCOSE 103* 104* 121* 100* 95  BUN 9 13 12 13 12   CREATININE 0.41* 0.49 0.48 0.43* 0.52  CALCIUM 8.2* 8.1* 7.8* 8.4* 8.0*  MG 1.8 1.7 1.8 1.7 1.8   GFR: Estimated Creatinine Clearance: 50.5 mL/min (by C-G formula based on SCr of 0.52 mg/dL). CBG: Recent Labs  Lab 01/18/20 0732 01/18/20 1152 01/18/20 1705 01/18/20 2107 01/19/20 0734  GLUCAP 83 119* 234* 200* 101*     Recent Results (from the past 240 hour(s))  Blood Culture (routine x 2)     Status: Abnormal   Collection Time: 01/10/20  1:15 PM   Specimen: BLOOD  Result Value Ref Range Status   Specimen Description   Final    BLOOD LEFT ANTECUBITAL Performed at [facility], [place] [address], [place], [place] [address]    Special Requests   Final    BOTTLES DRAWN AEROBIC AND ANAEROBIC Blood Culture  adequate volume Performed at [facility], [address], [place], [place] [address]    Culture  Setup Time   Final    GRAM NEGATIVE RODS Gram Stain Report Called to,Read Back By and Verified With: SAPPELT,J @ 6384 ON 01/11/20 BY JUW AEROBIC BOTTLE ONLY GS DONE @ [facility] GRAM NEGATIVE RODS ANAEROBIC BOTTLE ONLY Gram Stain Report Called to,Read Back By and Verified With: PREVIOUSLY CALLED @511  01/11/2020 KAY CRITICAL RESULT CALLED TO, READ BACK BY AND VERIFIED WITH: PHARMD MIKE M 31 [id number] FCP    Culture (A)  Final    PASTEURELLA MULTOCIDA Usually susceptible to penicillin and other beta lactam agents,quinolones,macrolides and  tetracyclines. Performed at [facility], [place] [address], [place], [place] [address]    Report Status 01/13/2020 FINAL  Final  Blood Culture ID Panel (Reflexed)     Status: None   Collection Time: 01/10/20  1:15 PM  Result Value Ref Range Status   Enterococcus species NOT DETECTED NOT DETECTED Final   Listeria monocytogenes NOT DETECTED NOT DETECTED Final   Staphylococcus species NOT DETECTED NOT DETECTED Final   Staphylococcus aureus (BCID) NOT DETECTED NOT DETECTED Final   Streptococcus species NOT DETECTED NOT DETECTED Final   Streptococcus agalactiae NOT DETECTED NOT DETECTED Final   Streptococcus pneumoniae NOT DETECTED NOT DETECTED Final   Streptococcus pyogenes NOT DETECTED NOT DETECTED Final   Acinetobacter baumannii NOT DETECTED NOT DETECTED Final   Enterobacteriaceae species NOT DETECTED NOT DETECTED Final   Enterobacter cloacae complex NOT DETECTED NOT DETECTED Final   Escherichia coli NOT DETECTED NOT DETECTED Final   Klebsiella oxytoca NOT DETECTED NOT DETECTED Final   Klebsiella pneumoniae NOT DETECTED NOT DETECTED Final   Proteus species NOT DETECTED NOT DETECTED Final   Serratia marcescens NOT DETECTED NOT DETECTED Final   Haemophilus influenzae NOT DETECTED NOT DETECTED Final   Neisseria meningitidis NOT DETECTED NOT DETECTED Final    Pseudomonas aeruginosa NOT DETECTED NOT DETECTED Final   Candida albicans NOT DETECTED NOT DETECTED Final   Candida glabrata NOT DETECTED NOT DETECTED Final   Candida krusei NOT DETECTED NOT DETECTED Final   Candida parapsilosis NOT DETECTED NOT DETECTED Final   Candida tropicalis NOT DETECTED NOT DETECTED Final    Comment: Performed at [facility], [place] [address] [place], [place] [address]  Blood Culture (routine x 2)     Status: None   Collection Time: 01/10/20  1:47 PM   Specimen: BLOOD LEFT ARM  Result Value Ref Range Status   Specimen Description BLOOD LEFT ARM BOTTLES DRAWN AEROBIC AND ANAEROBIC  Final   Special Requests Blood Culture adequate volume  Final   Culture   Final    NO GROWTH 5 DAYS Performed at [facility] [place] [facility], [address] [place], [place] [address]    Report Status 01/15/2020 FINAL  Final  Urine culture     Status: Abnormal   Collection Time: 01/10/20  2:00 PM   Specimen: Urine, Catheterized  Result Value Ref Range Status   Specimen Description   Final    URINE, CATHETERIZED Performed at [facility], [address], [place], [place] [address]    Special Requests   Final    NONE Performed at [facility], [address] [place], [place] [address]    Culture >=100,000 COLONIES/mL Kalman Jewels ASCORBATA (A)  Final   Report Status 01/13/2020 FINAL  Final   Organism ID, Bacteria KLUYVERA ASCORBATA (A)  Final      Susceptibility   Kluyvera ascorbata - MIC*    AMPICILLIN >=32 RESISTANT Resistant     CEFAZOLIN >=64 RESISTANT Resistant     CEFTRIAXONE <=1 SENSITIVE Sensitive     CIPROFLOXACIN <=0.25 SENSITIVE Sensitive     GENTAMICIN <=1 SENSITIVE Sensitive     IMIPENEM <=0.25 SENSITIVE Sensitive     NITROFURANTOIN 64 INTERMEDIATE Intermediate     TRIMETH/SULFA <=20 SENSITIVE Sensitive     AMPICILLIN/SULBACTAM <=2 SENSITIVE Sensitive     * >=100,000 COLONIES/mL KLUYVERA ASCORBATA  SARS Coronavirus 2 by RT PCR (hospital order, performed in [place]  hospital lab) Nasopharyngeal Nasopharyngeal Swab     Status: None   Collection Time: 01/10/20  2:00 PM   Specimen: Nasopharyngeal Swab  Result Value Ref Range Status   SARS Coronavirus 2 NEGATIVE NEGATIVE Final    Comment: (NOTE) SARS-CoV-2 target nucleic acids are NOT DETECTED.  The SARS-CoV-2 RNA is generally detectable in upper and lower respiratory specimens during the acute phase of infection. The lowest concentration of SARS-CoV-2 viral copies this assay can detect is 250 copies / mL. A negative result does not preclude SARS-CoV-2 infection and should not be used as the sole basis for treatment or other patient management decisions.  A negative result may occur with improper specimen collection / handling, submission of specimen other than nasopharyngeal swab, presence of viral mutation(s) within the areas targeted by this assay, and inadequate number of viral copies (<250 copies / mL). A negative result must be combined with clinical observations, patient history, and epidemiological information.  Fact Sheet for Patients:   StrictlyIdeas.no  Fact Sheet for Healthcare Providers: BankingDealers.co.za  This test is not yet approved or  cleared by the [place] FDA and has been authorized for detection and/or diagnosis of SARS-CoV-2 by FDA under an Emergency Use Authorization (EUA).  This EUA will remain in effect (meaning this test can be used) for the duration of the COVID-19 declaration under Section 564(b)(1) of the Act, 21 U.S.C. section 360bbb-3(b)(1), unless the authorization is terminated or revoked sooner.  Performed at [facility], [address], [place], [place] [address]       Imaging Studies   MR BRAIN W WO CONTRAST  Result Date: 01/19/2020 CLINICAL DATA:  Non-small cell lung cancer staging. Gait disturbance and speech disturbance. EXAM: MRI HEAD WITHOUT AND WITH CONTRAST TECHNIQUE: Multiplanar, multiecho  pulse sequences of the brain and surrounding structures were obtained without and with intravenous contrast. CONTRAST:  77mL GADAVIST GADOBUTROL 1 MMOL/ML IV SOLN COMPARISON:  03/22/2016 FINDINGS: The study is mildly motion degraded. Brain: There is a heterogeneous, mildly enhancing 2.7 cm mass medially in the left cerebellar hemisphere with a small amount of chronic blood products and mild surrounding edema. An enhancing, hemorrhagic mass in the right occipital lobe measures 6.5 x 2.8 x 3.9 cm with moderate surrounding edema, regional sulcal effacement, and mass effect on the right lateral ventricle. There is a peripherally enhancing 0.9 cm mass in the anteromedial right frontal lobe with chronic blood products and minimal edema. There is a new small focus of chronic hemorrhage and subtle T2 FLAIR hyperintensity in the right temporal lobe without definite enhancement (series 9, image 22 and series 10, image 21). No acute infarct, midline shift, or extra-axial fluid collection is identified. Patchy T2 hyperintensities in the cerebral white matter bilaterally have progressed from the prior MRI and are nonspecific but compatible with moderate chronic small vessel ischemic disease. There is moderate cerebral atrophy. Vascular: Major intracranial vascular flow voids are preserved. Skull and upper cervical spine: No suspicious marrow lesion. Sinuses/Orbits: Right cataract extraction. Small volume secretions in the right maxillary sinus. Minimally code solace a Candis Schatz the ethmoid sinuses and right sphenoid sinus. No significant mastoid fluid. Other: None. IMPRESSION: 1. 3 definite brain metastases including a 6.5 cm right occipital mass with moderate edema. No midline shift. 2. New subcentimeter focus of chronic hemorrhage in the right temporal lobe without definite enhancement, indeterminate for a fourth metastasis versus benign chronic hemorrhage. 3. Motion artifact reduces sensitivity for detection of additional  very small lesions, particularly given the mild enhancement of 2 smaller confirmed lesions. 4. Moderate chronic small vessel ischemic disease. Electronically Signed   By:  Logan Bores M.D.   On: 01/19/2020 11:47     Medications   Scheduled Meds: . dexamethasone (DECADRON) injection  4 mg Intravenous Q24H  . enoxaparin (LOVENOX) injection  40 mg Subcutaneous Q24H  . feeding supplement (GLUCERNA SHAKE)  237 mL Oral TID BM  . ferrous sulfate  325 mg Oral Q breakfast  . gabapentin  600 mg Oral TID  . insulin aspart  0-15 Units Subcutaneous TID WC  . metoprolol tartrate  12.5 mg Oral BID  . mometasone-formoterol  2 puff Inhalation BID  . multivitamin with minerals  1 tablet Oral Daily  . pantoprazole  40 mg Oral Daily  . pravastatin  40 mg Oral q1800  . QUEtiapine  12.5 mg Oral QHS  . senna  1 tablet Oral BID   Continuous Infusions: . ampicillin-sulbactam (UNASYN) IV Stopped (01/19/20 1224)       LOS: 9 days     Enisa Runyan [facility]  01/19/2020, 2:39 PM    If 7PM-7AM, please contact night-coverage. How to contact the [facility] Attending or Consulting provider [place] or covering provider during after hours [place], for this patient?    1. Check the care team in [facility] and look for a) attending/consulting TRH provider listed and b) the [facility] team listed 2. Log into [email or web address] and use [place]'s universal password to access. If you do not have the password, please contact the hospital operator. 3. Locate the [facility] provider you are looking for under [facility] and page to a number that you can be directly reached. 4. If you still have difficulty reaching the provider, please page the [facility] (Director on Call) for the Hospitalists listed on amion for assistance.

## 2020-01-19 NOTE — Care Management Important Message (Signed)
Important Message  Patient Details IM Letter given to [name] Name: [name] MRN: [id number] Date of Birth: 1940-11-13   Medicare Important Message Given:  Yes     Kerin Salen 01/19/2020, 10:10 AM

## 2020-01-19 NOTE — Progress Notes (Signed)
Physical Therapy Treatment Patient Details Name: [name] MRN: [id number] DOB: [birth year] Today's Date: 01/19/2020    History of Present Illness 79 year old with history of DM2, HLD, breast cancer status postmastectomy, HTN, COPD, tobacco use, recent diverticulitis and cecal perforation treated with antibiotic discharged to SNF presented back to the hospital with complains of worsening abdominal pain, found to have new right upper lobe lung mass, extensive adenopathy with concerning SVC syndrome.  Patient was transferred to [facility] for radiation treatment.  Cultures ended up growing Pasteurella.    PT Comments    Pt continues to participate well although fatigued after a busy morning.   Follow Up Recommendations  Home health PT;Supervision/Assistance - 24 hour     Equipment Recommendations  None recommended by PT    Recommendations for Other Services       Precautions / Restrictions Precautions Precautions: Fall Precaution Comments: visual deficits Restrictions Weight Bearing Restrictions: No    Mobility  Bed Mobility Overal bed mobility: Needs Assistance Bed Mobility: Supine to Sit;Sit to Supine     Supine to sit: Min guard;HOB elevated Sit to supine: Min guard;HOB elevated   General bed mobility comments: Incresed time  Transfers Overall transfer level: Needs assistance Equipment used: Rolling walker (2 wheeled) Transfers: Sit to/from Stand Sit to Stand: Min guard         General transfer comment: Min guard for safety. VCs safety, hand placement  Ambulation/Gait Ambulation/Gait assistance: Min guard Gait Distance (Feet): 100 Feet Assistive device: Rolling walker (2 wheeled) Gait Pattern/deviations: Step-through pattern;Decreased stride length     General Gait Details: Min guard for safety.   Stairs             Wheelchair Mobility    Modified Rankin (Stroke Patients Only)       Balance Overall balance assessment: Mild  deficits observed, not formally tested                                          Cognition Arousal/Alertness: Awake/alert Behavior During Therapy: WFL for tasks assessed/performed Overall Cognitive Status: Within Functional Limits for tasks assessed                                 General Comments: some mild memory issues      Exercises      General Comments        Pertinent Vitals/Pain Pain Assessment: Faces Faces Pain Scale: Hurts little more Pain Location: shoulder blades    Home Living                      Prior Function            PT Goals (current goals can now be found in the care plan section) Progress towards PT goals: Progressing toward goals    Frequency    Min 3X/week      PT Plan Current plan remains appropriate    Co-evaluation              AM-PAC PT "6 Clicks" Mobility   Outcome Measure  Help needed turning from your back to your side while in a flat bed without using bedrails?: A Little Help needed moving from lying on your back to sitting on the side of a flat bed without using bedrails?: A  Little Help needed moving to and from a bed to a chair (including a wheelchair)?: A Little Help needed standing up from a chair using your arms (e.g., wheelchair or bedside chair)?: A Little Help needed to walk in hospital room?: A Little Help needed climbing 3-5 steps with a railing? : A Little 6 Click Score: 18    End of Session Equipment Utilized During Treatment: Gait belt Activity Tolerance: Patient tolerated treatment well Patient left: in bed;with call bell/phone within reach;with bed alarm set   PT Visit Diagnosis: Unsteadiness on feet (R26.81);Difficulty in walking, not elsewhere classified (R26.2)     Time: 3810-1751 PT Time Calculation (min) (ACUTE ONLY): 17 min  Charges:  $Gait Training: 8-22 mins                        Doreatha Massed, PT Acute Rehabilitation  Office:  [phone] Pager: [phone]

## 2020-01-19 NOTE — TOC Progression Note (Signed)
Transition of Care [facility]) - Progression Note    Patient Details  Name: [name] MRN: [id number] Date of Birth: October 20, 1940  [facility] CM/SW Contact  Sarahlynn Cisnero, Marjie Skiff, RN Phone Number: 01/19/2020, 3:35 PM  Clinical Narrative:     This CM continues to work on transportation for pt to radiation from [facility]) This cm spoke with pt grandson [name] who states that he and the family all have jobs and families to take care of. This CM gave [name] the phone number for pt insurance company to see if she has transportation benefits with her policy. This CM also spoke with CSW at the cancer center who will check with their lead to see if there are additional transportation resources. TOC will continue to follow.  Expected Discharge Plan: [place] Barriers to Discharge: Continued Medical Work up  Expected Discharge Plan and Services Expected Discharge Plan: [place]   Discharge Planning Services: CM Consult Post Acute Care Choice: [place] arrangements for the past 2 months: Single Family Home                           HH Arranged: PT, Nurse's Aide, Social Work [employer] Agency: [place] Date [place]: 01/16/20 Time [place]: [place] Representative spoke with at [place]: [place] ([place]) Interventions    Readmission Risk Interventions Readmission Risk Prevention Plan 01/14/2020 12/15/2019  Transportation Screening Complete Complete  PCP or Specialist Appt within 5-7 Days - Complete  PCP or Specialist Appt within 3-5 Days Complete -  Medication Review (RN CM) - Complete  HRI or Home Care Consult Complete -  Social Work Consult for [place] Planning/Counseling Complete -  Palliative Care Screening Complete -  Medication Review Press photographer) Complete -  Some recent data might be hidden

## 2020-01-20 ENCOUNTER — Ambulatory Visit
Admit: 2020-01-20 | Discharge: 2020-01-20 | Disposition: A | Discharge status: Home / Self Care | Payer: Medicare HMO | Attending: Radiation Oncology | Admitting: Radiation Oncology

## 2020-01-20 ENCOUNTER — Ambulatory Visit: Payer: Medicare HMO

## 2020-01-20 DIAGNOSIS — C7931 Secondary malignant neoplasm of brain: Secondary | ICD-10-CM

## 2020-01-20 LAB — GLUCOSE, CAPILLARY
Glucose-Capillary: 113 mg/dL — ABNORMAL HIGH (ref 70–99)
Glucose-Capillary: 144 mg/dL — ABNORMAL HIGH (ref 70–99)
Glucose-Capillary: 208 mg/dL — ABNORMAL HIGH (ref 70–99)
Glucose-Capillary: 292 mg/dL — ABNORMAL HIGH (ref 70–99)

## 2020-01-20 LAB — COMPREHENSIVE METABOLIC PANEL
ALT: 15 U/L (ref 0–44)
AST: 17 U/L (ref 15–41)
Albumin: 2.8 g/dL — ABNORMAL LOW (ref 3.5–5.0)
Alkaline Phosphatase: 32 U/L — ABNORMAL LOW (ref 38–126)
Anion gap: 8 (ref 5–15)
BUN: 13 mg/dL (ref 8–23)
CO2: 26 mmol/L (ref 22–32)
Calcium: 8.4 mg/dL — ABNORMAL LOW (ref 8.9–10.3)
Chloride: 104 mmol/L (ref 98–111)
Creatinine, Ser: 0.53 mg/dL (ref 0.44–1.00)
GFR calc Af Amer: >60 mL/min (ref >60)
GFR calc non Af Amer: >60 mL/min (ref >60)
Glucose, Bld: 105 mg/dL — ABNORMAL HIGH (ref 70–99)
Potassium: 4 mmol/L (ref 3.5–5.1)
Sodium: 138 mmol/L (ref 135–145)
Total Bilirubin: 0.6 mg/dL (ref 0.3–1.2)
Total Protein: 5.8 g/dL — ABNORMAL LOW (ref 6.5–8.1)

## 2020-01-20 LAB — CBC
HCT: 31.8 % — ABNORMAL LOW (ref 36.0–46.0)
Hemoglobin: 10.1 g/dL — ABNORMAL LOW (ref 12.0–15.0)
MCH: 31 pg (ref 26.0–34.0)
MCHC: 31.8 g/dL (ref 30.0–36.0)
MCV: 97.5 fL (ref 80.0–100.0)
Platelets: 166 10*3/uL (ref 150–400)
RBC: 3.26 MIL/uL — ABNORMAL LOW (ref 3.87–5.11)
RDW: 17.5 % — ABNORMAL HIGH (ref 11.5–15.5)
WBC: 6 10*3/uL (ref 4.0–10.5)
nRBC: 0 % (ref 0.0–0.2)

## 2020-01-20 MED ORDER — DEXAMETHASONE 4 MG PO TABS
4.0000 mg | ORAL_TABLET | Freq: Three times a day (TID) | ORAL | Status: DC
  Administered 2020-01-20 – 2020-01-24 (×12): 4 mg via ORAL
  Filled 2020-01-20 (×12): qty 1

## 2020-01-20 NOTE — Progress Notes (Addendum)
I was unable to see the patient this evening but discussed her case with Dr. Kathyrn Sheriff. After reviewing her imaging, he did not recommend surgical resection of her brain disease. We will reach out to connect tomorrow with the patient. I also have a message in to Dr. Delton Coombes who she's been established with in the past in medical oncology. Given her burden of brain disease however her steroids were changed to 4mg  Dexamethasone TID and we will discuss options of palliative whole brain radiotherapy tomorrow.     Carola Rhine, PAC

## 2020-01-20 NOTE — Progress Notes (Signed)
PROGRESS NOTE    [name]   HFW:[id number]  DOB: 1940-10-08  PCP: Rosita Fire, MD    DOA: 01/10/2020 LOS: 60   Brief Narrative   79 year old with history of DM2, HLD, breast cancer status postmastectomy, HTN, COPD, tobacco use, recent diverticulitis and cecal perforation treated with antibiotic discharged to SNF presented back to the hospital with complains of worsening abdominal pain, found to have new right upper lobe lung mass, extensive adenopathy with concerning SVC syndrome.  Patient was transferred to [facility] for radiation treatment.  Biopsy showed poorly differentiated SCC.   Blood cultures grew Pasteurella, on Unasyn.    Assessment & Plan    Sepsis secondary to Pasteurella bacteremia UTI- UCx= Kluyvera Ascorbata Culture data reviewed.  Bacteremia noted only in 1 set of blood cultures.  Culture report also reviewed.  Patient will complete 10 days of IV Unasyn today.    Intra-abdominal mass associated with the cecum  Recent cecal perforation secondary to diverticulitis. She will need outpatient colonoscopy in the near future-6 to 8 weeks.  Right upper lobe mass with extensive mediastinal adenopathy and early SVC syndrome-slightly worsening High-grade poorly differentiated squamous cell carcinoma Right arm swelling secondary to SVC syndrome -Status post CT-guided biopsy of right upper lung mass 6/14 -Path report-high-grade poorly differentiated squamous cell carcinoma -She is currently getting radiation treatment.  She had simulation on 6/15.  First treatment on 6/16.  She will need 14 treatments.  Dr. Sondra Come, radiation oncologist.  MRI brain was done on 6/20 which unfortunately shows at least 4 metastatic lesions in the brain.  Patient was informed of this finding. --Further plans to be determined by oncology/radiation team. --Continue Decadron   History of breast cancer -Previously treated with mastectomy in 2010, also was on p.o. medications for 5  years.  Follows outpatient with Dr. Jamey Reas.  Oncology team following.  History of COPD with tobacco use -Bronchodilators as needed, incentive spirometer and flutter valve  Diabetes mellitus type 2 -Insulin sliding scale and Accu-Chek  Normocytic anemia Stable hemoglobin.  No evidence of overt bleeding.  Generalized weakness -PT/OT- recommending home with home health  Unfortunately patient does not have any means of transporting herself daily for radiation treatments.  Transition of care is following.  Will discuss with grandson today.   DVT prophylaxis: enoxaparin (LOVENOX) injection 40 mg Start: 01/16/20 1200   Diet:  Diet Orders (From admission, onward)    Start     Ordered   01/12/20 1414  DIET SOFT Room service appropriate? Yes; Fluid consistency: Thin  Diet effective now       Question Answer Comment  Room service appropriate? Yes   Fluid consistency: Thin      01/12/20 1413            Code Status: DNR    Subjective 01/20/20    Patient states that she is feeling well.  Denies any headaches.  No nausea or vomiting.   Disposition Plan & Communication   Status is: Inpatient  Remains inpatient appropriate because:Ongoing diagnostic testing needed not appropriate for outpatient work up, Unsafe d/c plan and Inpatient level of care appropriate due to severity of illness   Dispo: The patient is from: Home              Anticipated d/c is to: Home              Anticipated d/c date is: 2 days  Patient currently is not medically stable to d/c.    Family Communication: We will update grandson today.   Consults, Procedures, Significant Events   Consultants:   Medical Oncology  Radiation Oncology  Procedures:   Radiation therapy  Antimicrobials:   Unasyn    Objective   Vitals:   01/19/20 2210 01/20/20 0533 01/20/20 0915 01/20/20 0959  BP: 118/72 131/62    Pulse: 73 73    Resp: 16 17    Temp: 98.6 F (37 C) 97.9 F (36.6 C)     TempSrc: Oral Oral    SpO2: 96% 94% 95% 93%  Weight:      Height:        Intake/Output Summary (Last 24 hours) at 01/20/2020 1223 Last data filed at 01/20/2020 0630 Gross per 24 hour  Intake --  Output 1000 ml  Net -1000 ml   Filed Weights   01/10/20 1303  Weight: 63 kg    Physical Exam:  General appearance: Awake alert.  In no distress Resp: Clear to auscultation bilaterally.  Normal effort Cardio: S1-S2 is normal regular.  No S3-S4.  No rubs murmurs or bruit GI: Abdomen is soft.  Mildly tender in the right lower quadrant without any rebound rigidity or guarding.  No masses organomegaly.  Bowel sounds present and normal.   Extremities: No edema.  Full range of motion of lower extremities. Neurologic: Alert and oriented x3.  No focal neurological deficits.      Labs   Data Reviewed: I have personally reviewed following labs and imaging studies  CBC: Recent Labs  Lab 01/15/20 0547 01/16/20 0539 01/17/20 0654 01/18/20 0705 01/20/20 0543  WBC 7.0 8.1 8.9 8.4 6.0  HGB 10.2* 10.2* 10.1* 9.9* 10.1*  HCT 31.0* 32.3* 31.0* 31.2* 31.8*  MCV 93.1 93.4 93.4 95.1 97.5  PLT 163 198 190 188 277   Basic Metabolic Panel: Recent Labs  Lab 01/14/20 0617 01/14/20 0617 01/15/20 0547 01/16/20 0539 01/17/20 0654 01/18/20 0705 01/20/20 0543  NA 139   < > 137 137 138 135 138  K 3.8   < > 4.1 3.8 4.2 3.8 4.0  CL 107   < > 103 105 105 104 104  CO2 25   < > 25 27 25 26 26   GLUCOSE 103*   < > 104* 121* 100* 95 105*  BUN 9   < > 13 12 13 12 13   CREATININE 0.41*   < > 0.49 0.48 0.43* 0.52 0.53  CALCIUM 8.2*   < > 8.1* 7.8* 8.4* 8.0* 8.4*  MG 1.8  --  1.7 1.8 1.7 1.8  --    < > = values in this interval not displayed.   GFR: Estimated Creatinine Clearance: 50.5 mL/min (by C-G formula based on SCr of 0.53 mg/dL). CBG: Recent Labs  Lab 01/19/20 0734 01/19/20 1633 01/19/20 2124 01/20/20 0809 01/20/20 1141  GLUCAP 101* 235* 136* 113* 144*     Recent Results (from the  past 240 hour(s))  Blood Culture (routine x 2)     Status: Abnormal   Collection Time: 01/10/20  1:15 PM   Specimen: BLOOD  Result Value Ref Range Status   Specimen Description   Final    BLOOD LEFT ANTECUBITAL Performed at [facility], [place] [address] [place], [place] [address]    Special Requests   Final    BOTTLES DRAWN AEROBIC AND ANAEROBIC Blood Culture adequate volume Performed at [facility], [address], [place],  [address]  Culture  Setup Time   Final    GRAM NEGATIVE RODS Gram Stain Report Called to,Read Back By and Verified With: SAPPELT,J @ 7846 ON 01/11/20 BY JUW AEROBIC BOTTLE ONLY GS DONE @ [facility] GRAM NEGATIVE RODS ANAEROBIC BOTTLE ONLY Gram Stain Report Called to,Read Back By and Verified With: PREVIOUSLY CALLED @511  01/11/2020 KAY CRITICAL RESULT CALLED TO, READ BACK BY AND VERIFIED WITH: PHARMD MIKE [phone] [id number] FCP    Culture (A)  Final    PASTEURELLA MULTOCIDA Usually susceptible to penicillin and other beta lactam agents,quinolones,macrolides and tetracyclines. Performed at [facility], [place] [address] [place], [place] [address]    Report Status 01/13/2020 FINAL  Final  Blood Culture ID Panel (Reflexed)     Status: None   Collection Time: 01/10/20  1:15 PM  Result Value Ref Range Status   Enterococcus species NOT DETECTED NOT DETECTED Final   Listeria monocytogenes NOT DETECTED NOT DETECTED Final   Staphylococcus species NOT DETECTED NOT DETECTED Final   Staphylococcus aureus (BCID) NOT DETECTED NOT DETECTED Final   Streptococcus species NOT DETECTED NOT DETECTED Final   Streptococcus agalactiae NOT DETECTED NOT DETECTED Final   Streptococcus pneumoniae NOT DETECTED NOT DETECTED Final   Streptococcus pyogenes NOT DETECTED NOT DETECTED Final   Acinetobacter baumannii NOT DETECTED NOT DETECTED Final   Enterobacteriaceae species NOT DETECTED NOT DETECTED Final   Enterobacter cloacae complex NOT DETECTED NOT DETECTED Final    Escherichia coli NOT DETECTED NOT DETECTED Final   Klebsiella oxytoca NOT DETECTED NOT DETECTED Final   Klebsiella pneumoniae NOT DETECTED NOT DETECTED Final   Proteus species NOT DETECTED NOT DETECTED Final   Serratia marcescens NOT DETECTED NOT DETECTED Final   Haemophilus influenzae NOT DETECTED NOT DETECTED Final   Neisseria meningitidis NOT DETECTED NOT DETECTED Final   Pseudomonas aeruginosa NOT DETECTED NOT DETECTED Final   Candida albicans NOT DETECTED NOT DETECTED Final   Candida glabrata NOT DETECTED NOT DETECTED Final   Candida krusei NOT DETECTED NOT DETECTED Final   Candida parapsilosis NOT DETECTED NOT DETECTED Final   Candida tropicalis NOT DETECTED NOT DETECTED Final    Comment: Performed at [facility], [place] [address], [place], [place] [address]  Blood Culture (routine x 2)     Status: None   Collection Time: 01/10/20  1:47 PM   Specimen: BLOOD LEFT ARM  Result Value Ref Range Status   Specimen Description BLOOD LEFT ARM BOTTLES DRAWN AEROBIC AND ANAEROBIC  Final   Special Requests Blood Culture adequate volume  Final   Culture   Final    NO GROWTH 5 DAYS Performed at [place], [address], [place], [place] [address]    Report Status 01/15/2020 FINAL  Final  Urine culture     Status: Abnormal   Collection Time: 01/10/20  2:00 PM   Specimen: Urine, Catheterized  Result Value Ref Range Status   Specimen Description   Final    URINE, CATHETERIZED Performed at [facility], [address] [place], [place] [address]    Special Requests   Final    NONE Performed at [facility], [address] [place], [place] [address]    Culture >=100,000 COLONIES/mL Humberto Seals (A)  Final   Report Status 01/13/2020 FINAL  Final   Organism ID, Bacteria KLUYVERA ASCORBATA (A)  Final      Susceptibility   Kluyvera ascorbata - MIC*    AMPICILLIN >=32 RESISTANT Resistant     CEFAZOLIN >=64 RESISTANT Resistant  CEFTRIAXONE <=1 SENSITIVE Sensitive      CIPROFLOXACIN <=0.25 SENSITIVE Sensitive     GENTAMICIN <=1 SENSITIVE Sensitive     IMIPENEM <=0.25 SENSITIVE Sensitive     NITROFURANTOIN 64 INTERMEDIATE Intermediate     TRIMETH/SULFA <=20 SENSITIVE Sensitive     AMPICILLIN/SULBACTAM <=2 SENSITIVE Sensitive     * >=100,000 COLONIES/mL KLUYVERA ASCORBATA  SARS Coronavirus 2 by RT PCR (hospital order, performed in [place] hospital lab) Nasopharyngeal Nasopharyngeal Swab     Status: None   Collection Time: 01/10/20  2:00 PM   Specimen: Nasopharyngeal Swab  Result Value Ref Range Status   SARS Coronavirus 2 NEGATIVE NEGATIVE Final    Comment: (NOTE) SARS-CoV-2 target nucleic acids are NOT DETECTED.  The SARS-CoV-2 RNA is generally detectable in upper and lower respiratory specimens during the acute phase of infection. The lowest concentration of SARS-CoV-2 viral copies this assay can detect is 250 copies / mL. A negative result does not preclude SARS-CoV-2 infection and should not be used as the sole basis for treatment or other patient management decisions.  A negative result may occur with improper specimen collection / handling, submission of specimen other than nasopharyngeal swab, presence of viral mutation(s) within the areas targeted by this assay, and inadequate number of viral copies (<250 copies / mL). A negative result must be combined with clinical observations, patient history, and epidemiological information.  Fact Sheet for Patients:   StrictlyIdeas.no  Fact Sheet for Healthcare Providers: BankingDealers.co.za  This test is not yet approved or  cleared by the [place] FDA and has been authorized for detection and/or diagnosis of SARS-CoV-2 by FDA under an Emergency Use Authorization (EUA).  This EUA will remain in effect (meaning this test can be used) for the duration of the COVID-19 declaration under Section 564(b)(1) of the Act, 21 U.S.C. section  360bbb-3(b)(1), unless the authorization is terminated or revoked sooner.  Performed at [facility], [address], [place], [place] [address]       Imaging Studies   MR BRAIN W WO CONTRAST  Result Date: 01/19/2020 CLINICAL DATA:  Non-small cell lung cancer staging. Gait disturbance and speech disturbance. EXAM: MRI HEAD WITHOUT AND WITH CONTRAST TECHNIQUE: Multiplanar, multiecho pulse sequences of the brain and surrounding structures were obtained without and with intravenous contrast. CONTRAST:  101mL GADAVIST GADOBUTROL 1 MMOL/ML IV SOLN COMPARISON:  03/22/2016 FINDINGS: The study is mildly motion degraded. Brain: There is a heterogeneous, mildly enhancing 2.7 cm mass medially in the left cerebellar hemisphere with a small amount of chronic blood products and mild surrounding edema. An enhancing, hemorrhagic mass in the right occipital lobe measures 6.5 x 2.8 x 3.9 cm with moderate surrounding edema, regional sulcal effacement, and mass effect on the right lateral ventricle. There is a peripherally enhancing 0.9 cm mass in the anteromedial right frontal lobe with chronic blood products and minimal edema. There is a new small focus of chronic hemorrhage and subtle T2 FLAIR hyperintensity in the right temporal lobe without definite enhancement (series 9, image 22 and series 10, image 21). No acute infarct, midline shift, or extra-axial fluid collection is identified. Patchy T2 hyperintensities in the cerebral white matter bilaterally have progressed from the prior MRI and are nonspecific but compatible with moderate chronic small vessel ischemic disease. There is moderate cerebral atrophy. Vascular: Major intracranial vascular flow voids are preserved. Skull and upper cervical spine: No suspicious marrow lesion. Sinuses/Orbits: Right cataract extraction. Small volume secretions in the right maxillary sinus. Minimally code solace a  Cunningham the ethmoid sinuses and right sphenoid sinus. No significant  mastoid fluid. Other: None. IMPRESSION: 1. 3 definite brain metastases including a 6.5 cm right occipital mass with moderate edema. No midline shift. 2. New subcentimeter focus of chronic hemorrhage in the right temporal lobe without definite enhancement, indeterminate for a fourth metastasis versus benign chronic hemorrhage. 3. Motion artifact reduces sensitivity for detection of additional very small lesions, particularly given the mild enhancement of 2 smaller confirmed lesions. 4. Moderate chronic small vessel ischemic disease. Electronically Signed   By: Logan Bores M.D.   On: 01/19/2020 11:47     Medications   Scheduled Meds:  dexamethasone (DECADRON) injection  4 mg Intravenous Q24H   enoxaparin (LOVENOX) injection  40 mg Subcutaneous Q24H   feeding supplement (GLUCERNA SHAKE)  237 mL Oral TID BM   ferrous sulfate  325 mg Oral Q breakfast   gabapentin  600 mg Oral TID   insulin aspart  0-15 Units Subcutaneous TID WC   metoprolol tartrate  12.5 mg Oral BID   mometasone-formoterol  2 puff Inhalation BID   multivitamin with minerals  1 tablet Oral Daily   pantoprazole  40 mg Oral Daily   pravastatin  40 mg Oral q1800   QUEtiapine  12.5 mg Oral QHS   senna  1 tablet Oral BID   Continuous Infusions:  ampicillin-sulbactam (UNASYN) IV 3 g (01/20/20 1108)       LOS: 10 days     Zamariah Seaborn [facility]  01/20/2020, 12:23 PM    If 7PM-7AM, please contact night-coverage. How to contact the [facility] Attending or Consulting provider [place] or covering provider during after hours [place], for this patient?    1. Check the care team in [facility] and look for a) attending/consulting TRH provider listed and b) the [facility] team listed 2. Log into [email or web address] and use [place]'s universal password to access. If you do not have the password, please contact the hospital operator. 3. Locate the [facility] provider you are looking for under [facility] and page to a number that  you can be directly reached. 4. If you still have difficulty reaching the provider, please page the [facility] (Director on Call) for the Hospitalists listed on amion for assistance.

## 2020-01-20 NOTE — TOC Transition Note (Signed)
Transition of Care [facility]) - CM/SW Discharge Note   Patient Details  Name: [name] MRN: [id number] Date of Birth: 04-09-1941  Transition of Care [facility]) CM/SW Contact:  Lynnell Catalan, RN Phone Number: 01/20/2020, 1:30 PM   Clinical Narrative:    This CM spoke with grandson [name] again today via phone about transportation. [name] states that he called her insurance and she does have transportation benefits available from her insurance plan. [name] states that he knows of some transportation companies. He was given the dates and times of the upcoming radiation appointments. He states that he is very frustrated because he doesn't know the plan for his grandmother and hasn't heard from a MD recently. Attending asked to call [name] to give update.   Final next level of care: [place] Barriers to Discharge: Continued Medical Work up   Patient Goals and CMS Choice Patient states their goals for this hospitalization and ongoing recovery are:: To get home CMS Medicare.gov Compare Post Acute Care list provided to:: Patient Choice offered to / list presented to : Patient   Discharge Plan and Services   Discharge Planning Services: CM Consult Post Acute Care Choice: Home Health                    HH Arranged: PT, Nurse's Aide, Social Work [facility] Agency: [place] Date [place]: 01/16/20 Time [place]: [place] Representative spoke with at [place]: [place] ([place]) Interventions     Readmission Risk Interventions Readmission Risk Prevention Plan 01/14/2020 12/15/2019  Transportation Screening Complete Complete  PCP or Specialist Appt within 5-7 Days - Complete  PCP or Specialist Appt within 3-5 Days Complete -  Medication Review (RN CM) - Complete  HRI or Home Care Consult Complete -  Social Work Consult for [place] Planning/Counseling Complete -  Palliative Care Screening Complete -  Medication  Review Press photographer) Complete -  Some recent data might be hidden

## 2020-01-20 NOTE — Progress Notes (Signed)
Occupational Therapy Treatment Patient Details Name: [name] MRN: [id number] DOB: 08-22-1940 Today's Date: 01/20/2020    History of present illness 78 year old with history of DM2, HLD, breast cancer status postmastectomy, HTN, COPD, tobacco use, recent diverticulitis and cecal perforation treated with antibiotic discharged to SNF presented back to the hospital with complains of worsening abdominal pain, found to have new right upper lobe lung mass, extensive adenopathy with concerning SVC syndrome.  Patient was transferred to [facility] for radiation treatment.  Radiation treatment initiated 6/18. Patient found to have brain mets via MRI 6/21.   OT comments  Patient reporting lower extremity "weakness in my knees" today and min guard provided for safety. Patient did need increased time for transfers and struggled with sit to stand they eventually able to get up. Patient able to perform ADLs with min guard and use of walker. Patient depressed due to bad medical news but  Motivated for therapy as she  still wants to return home at discharge.   Follow Up Recommendations  Home health OT;Supervision/Assistance - 24 hour    Equipment Recommendations  None recommended by OT    Recommendations for Other Services      Precautions / Restrictions Precautions Precautions: Fall Precaution Comments: visual deficits Restrictions Weight Bearing Restrictions: No       Mobility Bed Mobility Overal bed mobility: Needs Assistance Bed Mobility: Supine to Sit;Sit to Supine     Supine to sit: Min guard;HOB elevated Sit to supine: Min guard;HOB elevated   General bed mobility comments: Incresed time  Transfers   Equipment used: Rolling walker (2 wheeled) Transfers: Sit to/from Stand Sit to Stand: Min guard Stand pivot transfers: Supervision;Min guard       General transfer comment: Min guard. Patient complaining of lower extremities feeling weak as if they were going to buckle  therefore ambulation limited. Once patient seated in chair therapist encouraged patient to perform hip and knee exercises to maintain strength and ROM.    Balance Overall balance assessment: Mild deficits observed, not formally tested                                         ADL either performed or assessed with clinical judgement   ADL Overall ADL's : Needs assistance/impaired Eating/Feeding: Independent   Grooming: Brushing hair;Standing;Min guard Grooming Details (indicate cue type and reason): Patient stood at mirror to brush hair holding onto walker with one hand.             Lower Body Dressing: Sit to/from stand;Min guard;Set up Lower Body Dressing Details (indicate cue type and reason): Patient donned socks sitting in bed. Patient donned underwear at side of bed. Patient struggled to stand from low bed but able to stand with increased time to pull underwear up.     Toileting- Clothing Manipulation and Hygiene: Set up;Min guard;Sit to/from stand Toileting - Clothing Manipulation Details (indicate cue type and reason): Patient peri area and buttocks wet from use of purewick. Patient ablet to manage blothing and wash periarea and buttocks with wash cloth with setup. min guard for safety.             Vision Patient Visual Report: Peripheral vision impairment     Perception     Praxis      Cognition Arousal/Alertness: Awake/alert Behavior During Therapy: WFL for tasks assessed/performed Overall Cognitive Status: Within Functional Limits for tasks  assessed                                 General Comments: Patient depressed and somewhat emotional today due to "bad news" from the doctor.        Exercises     Shoulder Instructions       General Comments      Pertinent Vitals/ Pain       Pain Assessment: No/denies pain  Home Living                                          Prior Functioning/Environment               Frequency  Min 2X/week        Progress Toward Goals  OT Goals(current goals can now be found in the care plan section)  Progress towards OT goals: Progressing toward goals  Acute Rehab OT Goals Patient Stated Goal: to go home and not need assistance OT Goal Formulation: With patient Time For Goal Achievement: 01/28/20 Potential to Achieve Goals: [place] Discharge plan remains appropriate    Co-evaluation          OT goals addressed during session: ADL's and self-care      AM-PAC OT "6 Clicks" Daily Activity     Outcome Measure   Help from another person eating meals?: None Help from another person taking care of personal grooming?: A Little Help from another person toileting, which includes using toliet, bedpan, or urinal?: A Little Help from another person bathing (including washing, rinsing, drying)?: A Little Help from another person to put on and taking off regular upper body clothing?: A Little Help from another person to put on and taking off regular lower body clothing?: A Little 6 Click Score: 19    End of Session Equipment Utilized During Treatment: Gait belt;Rolling walker  OT Visit Diagnosis: Unsteadiness on feet (R26.81)   Activity Tolerance Patient tolerated treatment well   Patient Left in chair;with call bell/phone within reach;with chair alarm set   Nurse Communication  (okay to see patient)        Time: 0931-0959 OT Time Calculation (min): 28 min  Charges: OT General Charges $OT Visit: 1 Visit OT Treatments $Self Care/Home Management : 23-37 mins  Antoni Stefan, OTR/L [place]  Office [phone] Pager: [phone]    Lenward Chancellor 01/20/2020, 12:00 PM

## 2020-01-21 ENCOUNTER — Ambulatory Visit
Admit: 2020-01-21 | Discharge: 2020-01-21 | Disposition: A | Discharge status: Home / Self Care | Payer: Medicare HMO | Attending: Radiation Oncology | Admitting: Radiation Oncology

## 2020-01-21 ENCOUNTER — Ambulatory Visit: Payer: Medicare HMO

## 2020-01-21 ENCOUNTER — Ambulatory Visit (HOSPITAL_COMMUNITY): Payer: Medicare HMO

## 2020-01-21 LAB — GLUCOSE, CAPILLARY
Glucose-Capillary: 224 mg/dL — ABNORMAL HIGH (ref 70–99)
Glucose-Capillary: 270 mg/dL — ABNORMAL HIGH (ref 70–99)
Glucose-Capillary: 222 mg/dL — ABNORMAL HIGH (ref 70–99)
Glucose-Capillary: 283 mg/dL — ABNORMAL HIGH (ref 70–99)

## 2020-01-21 MED ORDER — INSULIN ASPART 100 UNIT/ML ~~LOC~~ SOLN
0.0000 [IU] | Freq: Every day | SUBCUTANEOUS | Status: DC
  Administered 2020-01-21: 3 [IU] via SUBCUTANEOUS
  Administered 2020-01-23: 2 [IU] via SUBCUTANEOUS

## 2020-01-21 MED ORDER — INSULIN ASPART 100 UNIT/ML ~~LOC~~ SOLN
0.0000 [IU] | Freq: Three times a day (TID) | SUBCUTANEOUS | Status: DC
  Administered 2020-01-21: 7 [IU] via SUBCUTANEOUS
  Administered 2020-01-22: 15 [IU] via SUBCUTANEOUS
  Administered 2020-01-22: 3 [IU] via SUBCUTANEOUS
  Administered 2020-01-23: 11 [IU] via SUBCUTANEOUS
  Administered 2020-01-23: 3 [IU] via SUBCUTANEOUS
  Administered 2020-01-24: 4 [IU] via SUBCUTANEOUS

## 2020-01-21 MED ORDER — INSULIN GLARGINE 100 UNIT/ML ~~LOC~~ SOLN
5.0000 [IU] | Freq: Every day | SUBCUTANEOUS | Status: DC
  Administered 2020-01-21 – 2020-01-24 (×4): 5 [IU] via SUBCUTANEOUS
  Filled 2020-01-21 (×4): qty 0.05

## 2020-01-21 NOTE — Progress Notes (Signed)
PROGRESS NOTE    [name]   XBJ:[id number]  DOB: 1941-05-12  PCP: Rosita Fire, MD    DOA: 01/10/2020 LOS: 49   Brief Narrative   79 year old with history of DM2, HLD, breast cancer status postmastectomy, HTN, COPD, tobacco use, recent diverticulitis and cecal perforation treated with antibiotic discharged to SNF presented back to the hospital with complains of worsening abdominal pain, found to have new right upper lobe lung mass, extensive adenopathy with concerning SVC syndrome.  Patient was transferred to [facility] for radiation treatment.  Biopsy showed poorly differentiated SCC. Blood cultures grew Pasteurella, on Unasyn.    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  She had simulation on 6/15.  First treatment on 6/16.  She will need 14 treatments.  Dr. Sondra Come, radiation oncologist.  MRI brain was done on 6/20 which unfortunately shows at least 4 metastatic lesions in the brain.  Patient and her grandson were informed of this finding.  Discussed with medical oncology who recommended radiation oncology evaluate the patient.  It appears that the plan is for radiation treatment to the brain lesions.  They have discussed with neurosurgery and there is no role for surgical intervention. It appears that the dose of Decadron was also increased.  Sepsis secondary to Pasteurella bacteremia UTI- UCx= Kluyvera Ascorbata Culture data reviewed.  Bacteremia noted only in 1 set of blood cultures.  Culture report also reviewed.  Patient has completed 10-day course of Unasyn.  Intra-abdominal mass associated with the  cecum  Recent cecal perforation secondary to diverticulitis. She will need outpatient colonoscopy in the near future-6 to 8 weeks.  History of breast cancer -Previously treated with mastectomy in 2010, also was on p.o. medications for 5 years.  Follows outpatient with Dr. Jamey Reas.  Oncology team following.  History of COPD with tobacco use -Bronchodilators as needed, incentive spirometer and flutter valve  Diabetes mellitus type 2 Anticipate some worsening in her glucose levels due to higher dose of steroids.  Will increase her SSI.  C was 5.9 in May.  Normocytic anemia Stable hemoglobin.  No evidence of overt bleeding.  Generalized weakness -PT/OT- recommending home with home health  Unfortunately patient does not have any means of transporting herself daily for radiation treatments.  Transition of care is following.  Discussed with the grandson as well.   DVT prophylaxis: enoxaparin (LOVENOX) injection 40 mg Start: 01/16/20 1200   Diet:  Diet Orders (From admission, onward)    Start     Ordered   01/12/20 1414  DIET SOFT Room service appropriate? Yes; Fluid consistency: Thin  Diet effective now       Question Answer Comment  Room service appropriate? Yes   Fluid consistency: Thin      01/12/20 1413            Code Status: DNR    Subjective 01/21/20    Patient denies any headaches.  No nausea or vomiting.   Disposition Plan & Communication   Status is: Inpatient  Remains inpatient appropriate because:Ongoing diagnostic testing needed not appropriate for outpatient work up, Unsafe d/c plan and Inpatient level of care appropriate due to severity of illness   Dispo: The patient is from:  Home              Anticipated d/c is to: Home              Anticipated d/c date is: 2 days              Patient currently is not medically stable to d/c.    Family Communication: Yolanda Bonine was updated yesterday.   Consults, Procedures, Significant Events   Consultants:    Medical Oncology  Radiation Oncology  Procedures:   Radiation therapy  Antimicrobials:   Unasyn    Objective   Vitals:   01/20/20 1328 01/20/20 2200 01/21/20 0623 01/21/20 1100  BP: 119/68 (!) 152/73 (!) 150/78 (!) 137/59  Pulse: 84 84 78 82  Resp: 18 20 16    Temp: 98.4 F (36.9 C) 98.5 F (36.9 C) 97.7 F (36.5 C)   TempSrc: Oral Oral Oral   SpO2: 93% 96% 94%   Weight:      Height:        Intake/Output Summary (Last 24 hours) at 01/21/2020 1230 Last data filed at 01/21/2020 0920 Gross per 24 hour  Intake 480 ml  Output 1000 ml  Net -520 ml   Filed Weights   01/10/20 1303  Weight: 63 kg    Physical Exam:  General appearance: Awake alert.  In no distress Resp: Clear to auscultation bilaterally.  Normal effort Cardio: S1-S2 is normal regular.  No S3-S4.  No rubs murmurs or bruit GI: Abdomen is soft.  Mildly tender in the right lower quadrant without any rebound rigidity or guarding.  Bowel sounds are present normal.  No masses organomegaly Extremities: No edema.  Full range of motion of lower extremities. Neurologic: Alert and oriented x3.  No focal neurological deficits.       Labs   Data Reviewed: I have personally reviewed following labs and imaging studies  CBC: Recent Labs  Lab 01/15/20 0547 01/16/20 0539 01/17/20 0654 01/18/20 0705 01/20/20 0543  WBC 7.0 8.1 8.9 8.4 6.0  HGB 10.2* 10.2* 10.1* 9.9* 10.1*  HCT 31.0* 32.3* 31.0* 31.2* 31.8*  MCV 93.1 93.4 93.4 95.1 97.5  PLT 163 198 190 188 810   Basic Metabolic Panel: Recent Labs  Lab 01/15/20 0547 01/16/20 0539 01/17/20 0654 01/18/20 0705 01/20/20 0543  NA 137 137 138 135 138  K 4.1 3.8 4.2 3.8 4.0  CL 103 105 105 104 104  CO2 25 27 25 26 26   GLUCOSE 104* 121* 100* 95 105*  BUN 13 12 13 12 13   CREATININE 0.49 0.48 0.43* 0.52 0.53  CALCIUM 8.1* 7.8* 8.4* 8.0* 8.4*  MG 1.7 1.8 1.7 1.8  --    GFR: Estimated Creatinine Clearance: 50.5 mL/min (by C-G formula based on SCr of  0.53 mg/dL). CBG: Recent Labs  Lab 01/20/20 1141 01/20/20 1720 01/20/20 2203 01/21/20 0738 01/21/20 1109  GLUCAP 144* 208* 292* 224* 270*     No results found for this or any previous visit (from the past 240 hour(s)).    Imaging Studies   No results found.   Medications   Scheduled Meds: . dexamethasone  4 mg Oral Q8H  . enoxaparin (LOVENOX) injection  40 mg Subcutaneous Q24H  . feeding supplement (GLUCERNA SHAKE)  237 mL Oral TID BM  . ferrous sulfate  325 mg Oral Q breakfast  . gabapentin  600 mg Oral TID  . insulin aspart  0-15 Units Subcutaneous TID WC  . metoprolol tartrate  12.5 mg Oral BID  .  mometasone-formoterol  2 puff Inhalation BID  . multivitamin with minerals  1 tablet Oral Daily  . pantoprazole  40 mg Oral Daily  . pravastatin  40 mg Oral q1800  . QUEtiapine  12.5 mg Oral QHS  . senna  1 tablet Oral BID   Continuous Infusions:      LOS: 11 days     [facility]  01/21/2020, 12:30 PM    If 7PM-7AM, please contact night-coverage. How to contact the [facility] Attending or Consulting provider [place] or covering provider during after hours [place], for this patient?    1. Check the care team in [facility] and look for a) attending/consulting [facility] provider listed and b) the [facility] team listed 2. Log into [email or web address] and use [place]'s universal password to access. If you do not have the password, please contact the hospital operator. 3. Locate the [facility] provider you are looking for under [facility] and page to a number that you can be directly reached. 4. If you still have difficulty reaching the provider, please page the [facility] (Director on Call) for the Hospitalists listed on amion for assistance.

## 2020-01-21 NOTE — Consult Note (Signed)
Radiation Oncology         [phone] ________________________________  Name: [name]        MRN: [id number]  Date of Service: 01/21/20 DOB: 1940-08-28  IO:NGEXB, Lauren Melnick, MD    REFERRING PHYSICIAN: Dr. Maryland Pink  DIAGNOSIS: The primary encounter diagnosis was Sepsis, due to unspecified organism, unspecified whether acute organ dysfunction present [facility]). Diagnoses of Lung mass, Brain metastases ([place]), Infiltrating ductal carcinoma of right breast ([place]), and Malignant neoplasm of upper lobe of right lung [facility]) were also pertinent to this visit.   HISTORY OF PRESENT ILLNESS: [name] is a 79 y.o. female seen at the request of Dr. Maryland Pink for a newly diagnosed lung cancer of the right lung with new brain metastases. The patient was diagnosed on 01/12/20 with a high grade carcinoma of the right upper lobe consistent with a squamous cell carcinoma. She had clinical findings concerning for SVC syndrome and has begun a course of palliative radiotherapy to the chest on 01/13/20 and she has completed 6/14 prescribed fractions. She had a staging MRI of the brain with her new diagnosis on 01/19/20 that revealed a left cerebellar mass measuring 2.7 cm with mild edema and chronic blood products. A right occipital lobe mass measuring 6.5 x 2.8 x 3.9 cm was noted with moderate edema, regional sucal effacement and mass effect on the right lateral ventricle. There was also a 9 mm mass in the anteromedial right frontal lobe with chronic blood products and minimal edema. A small focus was also seen in the right temporal lobe. She has been on dexamethasone at 51m BID for her lung findings, but I've made adjustments to 4 mg TID given her MRI findings. Her case has been reviewed with Dr. NKathyrn Sheriffin neurosurgery and given her clinical findings and imaging findings and comorbidities and age he does not recommend surgical resection.    PREVIOUS RADIATION THERAPY: No   PAST MEDICAL HISTORY:  Past Medical  History:  Diagnosis Date  . Breast CA [place] 01/16/2011  . Breast cancer [place]    rt dx 2010; mastectomy only  . Diverticulitis   . Embolism - blood clot    left lung  . History of prolapse of bladder    patient has gellhorn pessary  . Hypertension   . Infiltrating ductal carcinoma of breast ([place] 01/16/2011   Started Arimidex on 02/24/2009. Stage IB (T1b N0 M0) grade 1 infiltrating ductal carcinoma of the right breast, status post right modified radical mastectomy on 01/22/2009 for a 10 mm cancer, ER +100%, PR +98%, KI-67 marker was 9%, HER-2 negative, no LVI identified, and 11 lymph nodes were negative, all margins were clear. She had some associated DCIS, is now on Arimidex and she will take that for  . [place]   . Tendonitis 07/2010   shot of cortisone       PAST SURGICAL HISTORY: Past Surgical History:  Procedure Laterality Date  . ABDOMINAL HYSTERECTOMY  1987  . APPENDECTOMY  1974   blood clot in lung  . BREAST BIOPSY  1968   left  . COLONOSCOPY N/A 01/11/2018   Procedure: COLONOSCOPY;  Surgeon: FDanie Binder MD;  Location: [facility] ENDO SUITE;  Service: Endoscopy;  Laterality: N/A;  10:00  . MASTECTOMY MODIFIED RADICAL  01/22/2009   rt  . NASAL SINUS SURGERY  2002  . POLYPECTOMY  01/11/2018   Procedure: POLYPECTOMY;  Surgeon: FDanie Binder MD;  Location: [facility] ENDO SUITE;  Service: Endoscopy;;  ascending; descending;  .  TEAR DUCT PROBING    . tubal ligation  1972  . YAG LASER APPLICATION Right 08/18/4172   Procedure: YAG LASER APPLICATION;  Surgeon: Williams Che, MD;  Location: [facility] ORS;  Service: Ophthalmology;  Laterality: Right;     FAMILY HISTORY:  Family History  Problem Relation Age of Onset  . Breast cancer Sister   . Breast cancer Maternal Grandmother   . Colon cancer Daughter 3  . Hypertension Paternal Grandfather   . Stroke Paternal Grandfather   . Other Paternal Grandmother        hardening of arteries  . Other Father        gun shot wound  . Cancer  Sister   . Cancer Daughter   . Stroke Son   . Heart attack Son   . Gastric cancer Neg Hx   . Esophageal cancer Neg Hx      SOCIAL HISTORY:  reports that she has been smoking cigarettes. She has a 31.00 pack-year smoking history. She has never used smokeless tobacco. She reports current alcohol use. She reports that she does not use drugs. The patient is divorced and livesin [place]. Her son and grandson are actively involved in her care, but her [name] also is able to join [place] for our discussion.   ALLERGIES: Pollen extract and Red dye   MEDICATIONS:  Current Facility-Administered Medications  Medication Dose Route Frequency Provider Last Rate Last Admin  . acetaminophen (TYLENOL) tablet 650 mg  650 mg Oral Q6H PRN Neena Rhymes, MD   650 mg at 01/20/20 2106  . albuterol (PROVENTIL) (2.5 MG/3ML) 0.083% nebulizer solution 2.5 mg  2.5 mg Nebulization Q6H PRN Norins, Heinz Knuckles, MD      . dexamethasone (DECADRON) tablet 4 mg  4 mg Oral Q8H Hayden Pedro, [place]   4 mg at 01/20/20 2106  . enoxaparin (LOVENOX) injection 40 mg  40 mg Subcutaneous Q24H Amin, Ankit Chirag, MD   40 mg at 01/20/20 1100  . feeding supplement (GLUCERNA SHAKE) (GLUCERNA SHAKE) liquid 237 mL  237 mL Oral TID BM Domenic Polite, MD   237 mL at 01/20/20 2100  . ferrous sulfate tablet 325 mg  325 mg Oral Q breakfast Norins, Heinz Knuckles, MD   325 mg at 01/20/20 0839  . gabapentin (NEURONTIN) capsule 600 mg  600 mg Oral TID Neena Rhymes, MD   600 mg at 01/20/20 2107  . HYDROmorphone (DILAUDID) injection 1 mg  1 mg Intravenous Q3H PRN Domenic Polite, MD      . insulin aspart (novoLOG) injection 0-15 Units  0-15 Units Subcutaneous TID WC Norins, Heinz Knuckles, MD   5 Units at 01/20/20 1722  . ipratropium-albuterol (DUONEB) 0.5-2.5 (3) MG/3ML nebulizer solution 3 mL  3 mL Nebulization Q4H PRN Amin, Ankit Chirag, MD      . metoprolol tartrate (LOPRESSOR) tablet 12.5 mg  12.5 mg Oral BID Norins, Heinz Knuckles, MD    12.5 mg at 01/20/20 2108  . mometasone-formoterol (DULERA) 200-5 MCG/ACT inhaler 2 puff  2 puff Inhalation BID Norins, Heinz Knuckles, MD   2 puff at 01/20/20 2100  . multivitamin with minerals tablet 1 tablet  1 tablet Oral Daily Domenic Polite, MD   1 tablet at 01/20/20 1059  . naphazoline-glycerin (CLEAR EYES REDNESS) ophth solution 2 drop  2 drop Both Eyes Daily PRN Norins, Heinz Knuckles, MD      . oxyCODONE (Oxy IR/ROXICODONE) immediate release tablet 5 mg  5 mg Oral Q4H PRN Amin,  Ankit Chirag, MD   5 mg at 01/20/20 1059  . pantoprazole (PROTONIX) EC tablet 40 mg  40 mg Oral Daily Norins, Heinz Knuckles, MD   40 mg at 01/20/20 1059  . polyethylene glycol (MIRALAX / GLYCOLAX) packet 17 g  17 g Oral Daily PRN Amin, Ankit Chirag, MD      . pravastatin (PRAVACHOL) tablet 40 mg  40 mg Oral q1800 Norins, Heinz Knuckles, MD   40 mg at 01/20/20 1716  . QUEtiapine (SEROQUEL) tablet 12.5 mg  12.5 mg Oral QHS Norins, Heinz Knuckles, MD   12.5 mg at 01/20/20 2108  . senna (SENOKOT) tablet 8.6 mg  1 tablet Oral BID Norins, Heinz Knuckles, MD   8.6 mg at 01/19/20 1013  . senna-docusate (Senokot-S) tablet 2 tablet  2 tablet Oral QHS PRN Amin, Ankit Chirag, MD      . umeclidinium bromide (INCRUSE ELLIPTA) 62.5 MCG/INH 1 puff  1 puff Inhalation Daily PRN Norins, Heinz Knuckles, MD         REVIEW OF SYSTEMS: On review of systems, the patient reports that she is doing well overall. She denies any confusion episodes, headaches, changes in speech or movement. She is having some trouble with her breathing but reports she has been tolerating treatment to her chest well so far.  No other complaints are noted.    PHYSICAL EXAM:  Wt Readings from Last 3 Encounters:  01/10/20 138 lb 14.2 oz (63 kg)  01/01/20 140 lb (63.5 kg)  12/12/19 130 lb (59 kg)   Temp Readings from Last 3 Encounters:  01/21/20 97.7 F (36.5 C) (Oral)  01/01/20 97.7 F (36.5 C) (Other (Comment))  12/19/19 98 F (36.7 C)   BP Readings from Last 3 Encounters:  01/21/20  (!) 150/78  01/01/20 117/72  12/19/19 (!) 134/56   Pulse Readings from Last 3 Encounters:  01/21/20 78  01/01/20 92  12/19/19 85   Pain Assessment Pain Score: 3 /10  Unable to assess due to encounter type.   ECOG = 1  0 - Asymptomatic (Fully active, able to carry on all predisease activities without restriction)  1 - Symptomatic but completely ambulatory (Restricted in physically strenuous activity but ambulatory and able to carry out work of a light or sedentary nature. For example, light housework, office work)  2 - Symptomatic, <50% in bed during the day (Ambulatory and capable of all self care but unable to carry out any work activities. Up and about more than 50% of waking hours)  3 - Symptomatic, >50% in bed, but not bedbound (Capable of only limited self-care, confined to bed or chair 50% or more of waking hours)  4 - Bedbound (Completely disabled. Cannot carry on any self-care. Totally confined to bed or chair)  5 - Death   Eustace Pen MM, Creech RH, Tormey DC, et al. [phone]). "Toxicity and response criteria of the [facility]". [place] Oncol. 5 (6): 649-55    LABORATORY DATA:  Lab Results  Component Value Date   WBC 6.0 01/20/2020   HGB 10.1 (L) 01/20/2020   HCT 31.8 (L) 01/20/2020   MCV 97.5 01/20/2020   PLT 166 01/20/2020   Lab Results  Component Value Date   NA 138 01/20/2020   K 4.0 01/20/2020   CL 104 01/20/2020   CO2 26 01/20/2020   Lab Results  Component Value Date   ALT 15 01/20/2020   AST 17 01/20/2020   ALKPHOS 32 (L) 01/20/2020   BILITOT 0.6  01/20/2020      RADIOGRAPHY: CT Chest W Contrast  Result Date: 01/10/2020 CLINICAL DATA:  Fever, hypotension, and abdominal pain. EXAM: CT CHEST, ABDOMEN, AND PELVIS WITH CONTRAST TECHNIQUE: Multidetector CT imaging of the chest, abdomen and pelvis was performed following the standard protocol during bolus administration of intravenous contrast. CONTRAST:  163m OMNIPAQUE IOHEXOL  300 MG/ML  SOLN COMPARISON:  Chest x-ray from same day. CT abdomen pelvis dated Dec 15, 2019. CT chest dated August 26, 2012. FINDINGS: CT CHEST FINDINGS Cardiovascular: Normal heart size. No pericardial effusion. No thoracic aortic aneurysm or dissection. Coronary, aortic arch, and branch vessel atherosclerotic vascular disease. No central pulmonary embolism. Mediastinum/Nodes: Conglomerate mediastinal lymphadenopathy measures 6.2 x 3.4 cm and invades and severely narrows the SVC. Enlarged high right paratracheal lymph node measuring 1.9 cm in short axis. Enlarged bilateral hilar lymph nodes measuring up to 1.8 cm in short axis. No enlarged axillary lymph nodes. Prominent 8 mm right internal mamillary lymph node (series 2, image 18). Prior right axillary lymph node dissection. Subcentimeter hypodense nodules in both thyroid lobes. Not clinically significant; no follow-up imaging recommended. The trachea and esophagus demonstrate no significant findings. Lungs/Pleura: Large 5.7 x 5.6 cm mass in the anterior right upper lobe with invasion of the chest wall and right first and second ribs. Focal narrowing of the right subclavian vein just superior to the mass. Small satellite nodules in the right upper lobe measuring up the mm (series 3, image 54). Multiple nodules in the left upper lobe measuring up to 2.2 cm (series 3, image 68). 9 mm nodule in the left lower lobe (series 3, image 100). Progressive basilar predominant peripheral subpleural reticulation and honeycombing throughout both lungs. No focal consolidation, pleural effusion, or pneumothorax. Musculoskeletal: Direct invasion of the right first and second ribs as above. Severe T1 compression fracture. CT ABDOMEN PELVIS FINDINGS Hepatobiliary: No focal liver abnormality is seen. No gallstones, gallbladder wall thickening, or biliary dilatation. Pancreas: Unremarkable. No pancreatic ductal dilatation or surrounding inflammatory changes. Spleen: Normal in size  without focal abnormality. Adrenals/Urinary Tract: The adrenal glands are unremarkable. Unchanged small bilateral renal cysts. No renal calculi or hydronephrosis. Mild circumferential bladder wall thickening. Stomach/Bowel: Progressive circumferential wall thickening cecum with enlarging mass anteriorly, currently measuring 3.9 x 3.7 cm, previously 2.9 x 3.1 cm. Surrounding inflammatory changes have resolved. Diffuse colonic diverticulosis. The stomach and small bowel are unremarkable. Vascular/Lymphatic: Aortic atherosclerosis. No enlarged abdominal or pelvic lymph nodes. Reproductive: Status post hysterectomy. No adnexal masses. Other: No free fluid or pneumoperitoneum. Unchanged small area of fat necrosis near the upper pole the right kidney. Musculoskeletal: No acute or significant osseous findings. IMPRESSION: Chest: 1. Large 5.7 x 5.6 cm mass in the anterior right upper lobe with invasion of the chest wall and right first and second ribs, consistent with primary bronchogenic carcinoma. 2. Ipsi- and contralateral nodal and pulmonary metastatic disease. Conglomerate mediastinal lymphadenopathy significantly narrows the SVC. 3. Severe T1 compression fracture, possibly pathologic. 4. UIP-type interstitial lung disease, progressed since 2014. 5. Aortic Atherosclerosis (ICD10-I70.0). Abdomen and pelvis: 1. Enlarging mass associated with the anterior cecum, currently measuring 3.9 x 3.7 cm, previously 2.9 x 3.1 cm. Surrounding inflammatory changes have resolved. Findings are concerning for primary colon cancer. Electronically Signed   By: WTitus DubinM.D.   On: 01/10/2020 15:45   CT guided needle placement  Result Date: 01/12/2020 CLINICAL DATA:  Anterior right upper lobe lung mass with rib destruction and chest wall invasion. History of breast carcinoma. EXAM: CT GUIDED  CORE BIOPSY OF RIGHT UPPER LOBE LUNG MASS ANESTHESIA/SEDATION: 1.0 mg IV Versed; 25 mcg IV Fentanyl Total Moderate Sedation Time:  13  minutes. The patient's level of consciousness and physiologic status were continuously monitored during the procedure by Radiology nursing. PROCEDURE: The procedure risks, benefits, and alternatives were explained to the patient. Questions regarding the procedure were encouraged and answered. The patient understands and consents to the procedure. A time-out was performed prior to initiating the procedure. CT of the upper chest was performed in a supine position. The right anterior chest wall was prepped with chlorhexidine in a sterile fashion, and a sterile drape was applied covering the operative field. A sterile gown and sterile gloves were used for the procedure. Local anesthesia was provided with 1% Lidocaine. Under CT guidance, a 47 gauge trocar needle was advanced to the level of an anterior right upper lobe lung mass. After confirming needle tip position, 3 separate coaxial 18 gauge core biopsy samples were obtained and submitted in formalin. Additional CT was performed. COMPLICATIONS: None FINDINGS: Lobulated mass centered in the anterior aspect of the right upper lobe and invading the anterior chest wall again noted measuring approximately 6.3 cm in greatest diameter. Solid tissue was obtained from the lateral aspect of the mass. No pneumothorax on post biopsy imaging. IMPRESSION: CT-guided core biopsy performed at the level of the large right upper lobe anterior mass invading the anterior chest wall. Electronically Signed   By: Aletta Edouard M.D.   On: 01/12/2020 17:04   MR BRAIN W WO CONTRAST  Result Date: 01/19/2020 CLINICAL DATA:  Non-small cell lung cancer staging. Gait disturbance and speech disturbance. EXAM: MRI HEAD WITHOUT AND WITH CONTRAST TECHNIQUE: Multiplanar, multiecho pulse sequences of the brain and surrounding structures were obtained without and with intravenous contrast. CONTRAST:  53m GADAVIST GADOBUTROL 1 MMOL/ML IV SOLN COMPARISON:  03/22/2016 FINDINGS: The study is mildly  motion degraded. Brain: There is a heterogeneous, mildly enhancing 2.7 cm mass medially in the left cerebellar hemisphere with a small amount of chronic blood products and mild surrounding edema. An enhancing, hemorrhagic mass in the right occipital lobe measures 6.5 x 2.8 x 3.9 cm with moderate surrounding edema, regional sulcal effacement, and mass effect on the right lateral ventricle. There is a peripherally enhancing 0.9 cm mass in the anteromedial right frontal lobe with chronic blood products and minimal edema. There is a new small focus of chronic hemorrhage and subtle T2 FLAIR hyperintensity in the right temporal lobe without definite enhancement (series 9, image 22 and series 10, image 21). No acute infarct, midline shift, or extra-axial fluid collection is identified. Patchy T2 hyperintensities in the cerebral white matter bilaterally have progressed from the prior MRI and are nonspecific but compatible with moderate chronic small vessel ischemic disease. There is moderate cerebral atrophy. Vascular: Major intracranial vascular flow voids are preserved. Skull and upper cervical spine: No suspicious marrow lesion. Sinuses/Orbits: Right cataract extraction. Small volume secretions in the right maxillary sinus. Minimally code solace a CCandis Schatzthe ethmoid sinuses and right sphenoid sinus. No significant mastoid fluid. Other: None. IMPRESSION: 1. 3 definite brain metastases including a 6.5 cm right occipital mass with moderate edema. No midline shift. 2. New subcentimeter focus of chronic hemorrhage in the right temporal lobe without definite enhancement, indeterminate for a fourth metastasis versus benign chronic hemorrhage. 3. Motion artifact reduces sensitivity for detection of additional very small lesions, particularly given the mild enhancement of 2 smaller confirmed lesions. 4. Moderate chronic small vessel ischemic disease. Electronically  Signed   By: Logan Bores M.D.   On: 01/19/2020 11:47   CT  ABDOMEN PELVIS W CONTRAST  Result Date: 01/10/2020 CLINICAL DATA:  Fever, hypotension, and abdominal pain. EXAM: CT CHEST, ABDOMEN, AND PELVIS WITH CONTRAST TECHNIQUE: Multidetector CT imaging of the chest, abdomen and pelvis was performed following the standard protocol during bolus administration of intravenous contrast. CONTRAST:  176m OMNIPAQUE IOHEXOL 300 MG/ML  SOLN COMPARISON:  Chest x-ray from same day. CT abdomen pelvis dated Dec 15, 2019. CT chest dated August 26, 2012. FINDINGS: CT CHEST FINDINGS Cardiovascular: Normal heart size. No pericardial effusion. No thoracic aortic aneurysm or dissection. Coronary, aortic arch, and branch vessel atherosclerotic vascular disease. No central pulmonary embolism. Mediastinum/Nodes: Conglomerate mediastinal lymphadenopathy measures 6.2 x 3.4 cm and invades and severely narrows the SVC. Enlarged high right paratracheal lymph node measuring 1.9 cm in short axis. Enlarged bilateral hilar lymph nodes measuring up to 1.8 cm in short axis. No enlarged axillary lymph nodes. Prominent 8 mm right internal mamillary lymph node (series 2, image 18). Prior right axillary lymph node dissection. Subcentimeter hypodense nodules in both thyroid lobes. Not clinically significant; no follow-up imaging recommended. The trachea and esophagus demonstrate no significant findings. Lungs/Pleura: Large 5.7 x 5.6 cm mass in the anterior right upper lobe with invasion of the chest wall and right first and second ribs. Focal narrowing of the right subclavian vein just superior to the mass. Small satellite nodules in the right upper lobe measuring up the mm (series 3, image 54). Multiple nodules in the left upper lobe measuring up to 2.2 cm (series 3, image 68). 9 mm nodule in the left lower lobe (series 3, image 100). Progressive basilar predominant peripheral subpleural reticulation and honeycombing throughout both lungs. No focal consolidation, pleural effusion, or pneumothorax.  Musculoskeletal: Direct invasion of the right first and second ribs as above. Severe T1 compression fracture. CT ABDOMEN PELVIS FINDINGS Hepatobiliary: No focal liver abnormality is seen. No gallstones, gallbladder wall thickening, or biliary dilatation. Pancreas: Unremarkable. No pancreatic ductal dilatation or surrounding inflammatory changes. Spleen: Normal in size without focal abnormality. Adrenals/Urinary Tract: The adrenal glands are unremarkable. Unchanged small bilateral renal cysts. No renal calculi or hydronephrosis. Mild circumferential bladder wall thickening. Stomach/Bowel: Progressive circumferential wall thickening cecum with enlarging mass anteriorly, currently measuring 3.9 x 3.7 cm, previously 2.9 x 3.1 cm. Surrounding inflammatory changes have resolved. Diffuse colonic diverticulosis. The stomach and small bowel are unremarkable. Vascular/Lymphatic: Aortic atherosclerosis. No enlarged abdominal or pelvic lymph nodes. Reproductive: Status post hysterectomy. No adnexal masses. Other: No free fluid or pneumoperitoneum. Unchanged small area of fat necrosis near the upper pole the right kidney. Musculoskeletal: No acute or significant osseous findings. IMPRESSION: Chest: 1. Large 5.7 x 5.6 cm mass in the anterior right upper lobe with invasion of the chest wall and right first and second ribs, consistent with primary bronchogenic carcinoma. 2. Ipsi- and contralateral nodal and pulmonary metastatic disease. Conglomerate mediastinal lymphadenopathy significantly narrows the SVC. 3. Severe T1 compression fracture, possibly pathologic. 4. UIP-type interstitial lung disease, progressed since 2014. 5. Aortic Atherosclerosis (ICD10-I70.0). Abdomen and pelvis: 1. Enlarging mass associated with the anterior cecum, currently measuring 3.9 x 3.7 cm, previously 2.9 x 3.1 cm. Surrounding inflammatory changes have resolved. Findings are concerning for primary colon cancer. Electronically Signed   By: WTitus DubinM.D.   On: 01/10/2020 15:45   DG Chest Port 1 View  Result Date: 01/10/2020 CLINICAL DATA:  Cough, sepsis EXAM: PORTABLE CHEST 1 VIEW COMPARISON:  Multiple prior studies most recent chest x-ray from 12/12/2019 FINDINGS: Findings in the RIGHT upper chest with increasing confluent density in this area measuring 8.7 x 5.7 cm. Nodule in the LEFT chest at 1.4 cm. Apical scarring. Diffuse increased interstitial markings superimposed on pulmonary emphysema. Signs of atherosclerosis. No evidence of pleural effusion. Cardiomediastinal contours are normal. There is however fullness of the RIGHT hilum Absence of rib shadows of anterior ribs. No additional bony abnormality on limited assessment. IMPRESSION: 1. Suspected mass in the RIGHT upper lobe with anterior rib destruction and associated LEFT lower lobe nodule. CT of the chest is recommended for further evaluation. 2. Fullness of the RIGHT hilum raising the question of RIGHT hilar adenopathy. 3. Diffused increased interstitial markings could represent chronic lung disease, pneumonitis or lymphangitic tumor is also considered. These results were called by telephone at the time of interpretation on 01/10/2020 at 2:27 pm to provider [facility] , who verbally acknowledged these results. Electronically Signed   By: Zetta Bills M.D.   On: 01/10/2020 14:24   VAS [place] UPPER EXTREMITY VENOUS DUPLEX  Result Date: 01/11/2020 UPPER VENOUS STUDY  Indications: Pain, Swelling, and Erythema Other Indications: Cellulitis with sepsis. Comparison Study: Prior study from 09/06/2017 is available for comparison Performing Technologist: Sharion Dove RVS  Examination Guidelines: A complete evaluation includes B-mode imaging, spectral Doppler, color Doppler, and power Doppler as needed of all accessible portions of each vessel. Bilateral testing is considered an integral part of a complete examination. Limited examinations for reoccurring indications may be performed as noted.   Right Findings: +----------+------------+---------+-----------+----------+-------+ RIGHT     CompressiblePhasicitySpontaneousPropertiesSummary +----------+------------+---------+-----------+----------+-------+ IJV           Full       Yes       Yes                      +----------+------------+---------+-----------+----------+-------+ Subclavian    Full       Yes       Yes                      +----------+------------+---------+-----------+----------+-------+ Axillary      Full       Yes       Yes                      +----------+------------+---------+-----------+----------+-------+ Brachial      Full       Yes       Yes                      +----------+------------+---------+-----------+----------+-------+ Radial        Full                                          +----------+------------+---------+-----------+----------+-------+ Ulnar         Full                                          +----------+------------+---------+-----------+----------+-------+ Cephalic      Full                                          +----------+------------+---------+-----------+----------+-------+  Basilic       Full                                          +----------+------------+---------+-----------+----------+-------+  Left Findings: +----------+------------+---------+-----------+----------+-------+ LEFT      CompressiblePhasicitySpontaneousPropertiesSummary +----------+------------+---------+-----------+----------+-------+ Subclavian    Full       Yes       Yes                      +----------+------------+---------+-----------+----------+-------+  Summary:  Right: No evidence of deep vein thrombosis in the upper extremity. No evidence of superficial vein thrombosis in the upper extremity.  Left: No evidence of thrombosis in the subclavian.  *See table(s) above for measurements and observations.  Diagnosing physician: Monica Martinez MD  Electronically signed by Monica Martinez MD on 01/11/2020 at 11:15:47 AM.    Final        IMPRESSION/PLAN: 1. 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 Dr. Lisbeth Renshaw has reviewed the patient's case and the findings from her staging MRI of the brain. He recommends continuation of her palliatve radiotherapy to the chest and the additional course of palliative whole brain radiotherapy. She is not a candidate for surgical resection after review with Dr. Kathyrn Sheriff.  the pathology findings and reviews the nature of metastatic lung cancer We discussed the risks, benefits, short, and long term effects of radiotherapy, and  the delivery and logistics of radiotherapy. Dr. Lisbeth Renshaw anticipates a course of 2  weeks of radiotherapy to the whole brain. The patient is interested in proceed. She will simulate today around 3 pm and we would recommend proceeding with treatment tomorrow. We would also continue steroids and slowly taper due to the size of her disease. I've reached out to Dr. Delton Coombes as well regarding her current situation. 2. Remote history of Stage IA, [id number] ER positive invasive ductal carcinoma of the right breast. She completed 5 years of anastrozole and is followed by Dr. Delton Coombes for this as well.    In a visit lasting 60 minutes, greater than 50% of the time was spent by phone and in floor time discussing the patient's condition, in preparation for the discussion, and coordinating the patient's care.    Carola Rhine, PAC

## 2020-01-21 NOTE — Progress Notes (Signed)
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  Patient was transferred to [facility] for radiation treatment.  Radiation treatment initiated 6/18. Patient found to have brain mets via MRI 6/21.    PT Comments    Pt demonstrating good progress today.  Able to ambulate 300' with RW and min cues and guidance for RW.  Good participation with standing balance activities, but did need min A for steadying at times.  Cont POC and recommended use of RW.     Follow Up Recommendations  Home health PT;Supervision/Assistance - 24 hour     Equipment Recommendations  None recommended by PT    Recommendations for Other Services       Precautions / Restrictions Precautions Precautions: Fall Precaution Comments: visual deficits    Mobility  Bed Mobility Overal bed mobility: Needs Assistance Bed Mobility: Supine to Sit;Sit to Supine     Supine to sit: Supervision Sit to supine: Supervision      Transfers Overall transfer level: Needs assistance Equipment used: Rolling walker (2 wheeled) Transfers: Sit to/from Stand Sit to Stand: Supervision         General transfer comment: sit to stand x 2; cues for safe hand placement  Ambulation/Gait Ambulation/Gait assistance: Min guard Gait Distance (Feet): 300 Feet Assistive device: Rolling walker (2 wheeled) Gait Pattern/deviations: Step-through pattern;Decreased stride length Gait velocity: decreased   General Gait Details: Pt recalled cues of "stand tall, big steps, stay in  the box."  Did require cues and occasional assist for RW as she tended to drift to the right.  Reports she has "tunnel vision" at all times and chronic   Stairs             Wheelchair Mobility    Modified Rankin (Stroke Patients Only)       Balance Overall balance assessment: Needs assistance Sitting-balance support: No upper extremity supported Sitting balance-Leahy Scale: Good Sitting balance - Comments: required UE support with marching sitting at EOB; worked on weight shifting and turning and reaching outside BOS x 10 both sides   Standing balance support: No upper extremity supported Standing balance-Leahy Scale: Good Standing balance comment: Tolerated minimal challenges without RW: standing feet apart EO and EC, reaching <5" outside BOS, marching (but occasionally touched RW)             High level balance activites: Side stepping;Backward walking (for 1 minute)              Cognition Arousal/Alertness: Awake/alert Behavior During Therapy: WFL for tasks assessed/performed Overall Cognitive Status: Within Functional Limits for tasks assessed                                        Exercises      General Comments General comments (skin integrity, edema, etc.): Pt reports has son (with TBI) and grandson who assist.  Also, has a Industrial/product designer and pastor that check on frequently to assist.      Pertinent Vitals/Pain Pain Assessment: No/denies pain  Home Living                      Prior Function            PT Goals (current goals can now be found in the care plan section) Acute Rehab PT Goals Patient Stated Goal: to go home and not need assistance PT Goal Formulation: With patient Time For Goal Achievement: 01/28/20 Potential to Achieve Goals: Good Progress towards PT goals: Progressing toward goals    Frequency    Min 3X/week      PT Plan Current plan remains appropriate    Co-evaluation               AM-PAC PT "6 Clicks" Mobility   Outcome Measure  Help needed turning from your back to your side while in a flat bed without using bedrails?: None Help needed moving from lying on your back to sitting on the side of a flat bed without using bedrails?: None Help needed moving to and from a bed to a chair (including a wheelchair)?: None Help needed standing up from a chair using your arms (e.g., wheelchair or bedside chair)?: None Help needed to walk in hospital room?: A Little Help needed climbing 3-5 steps with a railing? : A Little 6 Click Score: 22    End of Session Equipment Utilized During Treatment: Gait belt Activity Tolerance: Patient tolerated treatment well Patient left: Other (comment) (in w/c with staff to go for further treatment) Nurse Communication: Mobility status PT Visit Diagnosis: Unsteadiness on feet (R26.81);Difficulty in walking, not elsewhere classified (R26.2)     Time: 4128-7867 PT Time Calculation (min) (ACUTE ONLY): 33 min  Charges:  $Gait Training: 8-22 mins $Neuromuscular Re-education: 8-22 mins                     Abran Richard, PT Acute Rehab Services Pager [phone] [facility] [place] 01/21/2020, 3:03 PM

## 2020-01-22 ENCOUNTER — Ambulatory Visit
Admit: 2020-01-22 | Discharge: 2020-01-22 | Disposition: A | Discharge status: Home / Self Care | Payer: Medicare HMO | Attending: Radiation Oncology | Admitting: Radiation Oncology

## 2020-01-22 ENCOUNTER — Other Ambulatory Visit: Payer: Self-pay | Admitting: *Deleted

## 2020-01-22 ENCOUNTER — Ambulatory Visit: Payer: Medicare HMO

## 2020-01-22 DIAGNOSIS — K921 Melena: Secondary | ICD-10-CM

## 2020-01-22 LAB — BASIC METABOLIC PANEL
Anion gap: 12 (ref 5–15)
BUN: 16 mg/dL (ref 8–23)
CO2: 22 mmol/L (ref 22–32)
Calcium: 9 mg/dL (ref 8.9–10.3)
Chloride: 101 mmol/L (ref 98–111)
Creatinine, Ser: 0.4 mg/dL — ABNORMAL LOW (ref 0.44–1.00)
GFR calc Af Amer: >60 mL/min (ref >60)
GFR calc non Af Amer: >60 mL/min (ref >60)
Glucose, Bld: 170 mg/dL — ABNORMAL HIGH (ref 70–99)
Potassium: 4.1 mmol/L (ref 3.5–5.1)
Sodium: 135 mmol/L (ref 135–145)

## 2020-01-22 LAB — CBC
HCT: 31.3 % — ABNORMAL LOW (ref 36.0–46.0)
Hemoglobin: 10 g/dL — ABNORMAL LOW (ref 12.0–15.0)
MCH: 30.8 pg (ref 26.0–34.0)
MCHC: 31.9 g/dL (ref 30.0–36.0)
MCV: 96.3 fL (ref 80.0–100.0)
Platelets: 189 10*3/uL (ref 150–400)
RBC: 3.25 MIL/uL — ABNORMAL LOW (ref 3.87–5.11)
RDW: 17.3 % — ABNORMAL HIGH (ref 11.5–15.5)
WBC: 12.2 10*3/uL — ABNORMAL HIGH (ref 4.0–10.5)
nRBC: 0 % (ref 0.0–0.2)

## 2020-01-22 LAB — GLUCOSE, CAPILLARY
Glucose-Capillary: 144 mg/dL — ABNORMAL HIGH (ref 70–99)
Glucose-Capillary: 323 mg/dL — ABNORMAL HIGH (ref 70–99)
Glucose-Capillary: 87 mg/dL (ref 70–99)
Glucose-Capillary: 319 mg/dL — ABNORMAL HIGH (ref 70–99)
Glucose-Capillary: 290 mg/dL — ABNORMAL HIGH (ref 70–99)

## 2020-01-22 LAB — HEMOGLOBIN AND HEMATOCRIT, BLOOD
HCT: 32.9 % — ABNORMAL LOW (ref 36.0–46.0)
Hemoglobin: 10.5 g/dL — ABNORMAL LOW (ref 12.0–15.0)

## 2020-01-22 NOTE — Progress Notes (Signed)
Inpatient Diabetes Program Recommendations  AACE/ADA: New Consensus Statement on Inpatient Glycemic Control (2015)  Target Ranges:  Prepandial:   less than 140 mg/dL      Peak postprandial:   less than 180 mg/dL (1-2 hours)      Critically ill patients:  140 - 180 mg/dL   Lab Results  Component Value Date   GLUCAP 323 (H) 01/22/2020   HGBA1C 5.9 (H) 12/12/2019  Results for SAMHITA, KRETSCH (MRN [id number]) as of 01/22/2020 12:29  Ref. Range 01/21/2020 11:09 01/21/2020 17:29 01/21/2020 20:32 01/22/2020 07:17 01/22/2020 11:22  Glucose-Capillary Latest Ref Range: 70 - 99 mg/dL 270 (H) 222 (H) 283 (H) 144 (H) 323 (H)    Review of Glycemic Control  Diabetes history: type 2 Outpatient Diabetes medications: Tradjenta 5 mg daily, Metformin 500 mg BID Current orders for Inpatient glycemic control: Lantus 5 units daily, Novolog RESISTANT correction scale TID & HS scale  Inpatient Diabetes Program Recommendations:   Noted that postprandial blood sugars are elevated. Recommend adding Novolog 3 units TID with meals if patient eats at least 50% of meal and blood sugars continue to be elevated.   Harvel Ricks RN BSN CDE Diabetes Coordinator Pager: [phone]  8am-5pm

## 2020-01-22 NOTE — Progress Notes (Addendum)
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  Patient was transferred to [facility] for radiation treatment.  Biopsy showed poorly differentiated SCC. Blood cultures grew Pasteurella, on Unasyn.    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  She had simulation on 6/15.  First treatment on 6/16.  She will need 14 treatments.  Dr. Sondra Come, radiation oncologist.  MRI brain was done on 6/20 which unfortunately shows at least 4 metastatic lesions in the brain.  Patient and her grandson were informed of this finding.  Discussed with medical oncology who recommended radiation oncology evaluate the patient. Patient to be started on radiation treatment to her brain lesions today.  Radiation oncology has also discussed with neurosurgery and unfortunately not a candidate for surgical intervention.  Her dose of dexamethasone was also increased.    Hematochezia Called by nursing staff this morning that the patient had a large bowel movement with blood mixed in it.  She has been having bowel movements every day without any blood.  Patient does have a history of cecal diverticulitis with  perforation which was managed nonoperatively.  She does have mass in that area based on recent CT scan.  We will continue to monitor her for now.  We will check another hemoglobin.  If she continues to have hematochezia we may need to repeat a CT scan and involve gastroenterology.  Sepsis secondary to Pasteurella bacteremia UTI- UCx= Kluyvera Ascorbata Culture data reviewed.  Bacteremia noted only in 1 set of blood cultures.  Culture report also reviewed.  Patient has completed 10-day course of Unasyn.  Intra-abdominal mass associated with the cecum  Recent cecal perforation secondary to diverticulitis. She will need outpatient colonoscopy in the near future-6 to 8 weeks.  See above as well.  History of breast cancer -Previously treated with mastectomy in 2010, also was on p.o. medications for 5 years.  Follows outpatient with Dr. Jamey Reas.  Oncology team following.  History of COPD with tobacco use -Bronchodilators as needed, incentive spirometer and flutter valve  Diabetes mellitus type 2 CBGs were noted to be elevated.  Most likely due to the higher dose of steroids that she is on now.  Her SSI was increased.  HbA1c was 5.9 in May. CBG seems to be better today.  Continue to monitor.    Normocytic anemia Stable hemoglobin.  No evidence of overt bleeding.  Generalized weakness -PT/OT- recommending home with home health  Discussed with the patient's grandchildren in the room today.  They are arranging for transportation for her radiation therapy.  Patient will need to stay in the hospital to complete at least 2 treatments to her brain.  Earliest discharge will be Saturday.  Will also need to see how the hematochezia plays out.     DVT  prophylaxis: enoxaparin (LOVENOX) injection 40 mg Start: 01/16/20 1200   Diet:  Diet Orders (From admission, onward)    Start     Ordered   01/12/20 1414  DIET SOFT Room service appropriate? Yes; Fluid consistency: Thin  Diet effective now        Question Answer Comment  Room service appropriate? Yes   Fluid consistency: Thin      01/12/20 1413            Code Status: DNR    Subjective 01/22/20    Patient feeling well.  Denies any headache.  No other complaints offered.   Disposition Plan & Communication  Status is: Inpatient  Remains inpatient appropriate because:Inpatient level of care appropriate due to severity of illness.  Brain mets requiring radiation treatment.   Dispo:  Patient From: Home  Planned Disposition: Home  Expected discharge date: 01/26/20  Medically stable for discharge: No     Family Communication: Yolanda Bonine and granddaughter updated.   Consults, Procedures, Significant Events   Consultants:   Medical Oncology  Radiation Oncology  Procedures:   Radiation therapy  Antimicrobials:   Unasyn    Objective   Vitals:   01/21/20 1321 01/21/20 2035 01/22/20 0613 01/22/20 1011  BP: (!) 153/60 (!) 157/94 (!) 142/64 137/61  Pulse: 86 94 77 88  Resp: 18 16 14    Temp: 97.8 F (36.6 C) 98.1 F (36.7 C) 98 F (36.7 C)   TempSrc: Oral Oral Oral   SpO2: 96% 100% 93%   Weight:      Height:        Intake/Output Summary (Last 24 hours) at 01/22/2020 1112 Last data filed at 01/22/2020 1020 Gross per 24 hour  Intake 1200 ml  Output 750 ml  Net 450 ml   Filed Weights   01/10/20 1303  Weight: 63 kg    Physical Exam:  General appearance: Awake alert.  In no distress Resp: Clear to auscultation bilaterally.  Normal effort Cardio: S1-S2 is normal regular.  No S3-S4.  No rubs murmurs or bruit GI: Abdomen is soft.  Slightly tender in the right lower quadrant without any rebound rigidity or guarding.  Bowel sounds present.  Extremities: No edema.  Full range of motion of lower extremities. Neurologic: Alert and oriented x3.  No focal neurological deficits.        Labs   Data Reviewed: I have personally reviewed following labs and imaging studies  CBC: Recent Labs  Lab  01/16/20 0539 01/16/20 0539 01/17/20 0654 01/18/20 0705 01/20/20 0543 01/22/20 0605 01/22/20 1046  WBC 8.1  --  8.9 8.4 6.0 12.2*  --   HGB 10.2*   < > 10.1* 9.9* 10.1* 10.0* 10.5*  HCT 32.3*   < > 31.0* 31.2* 31.8* 31.3* 32.9*  MCV 93.4  --  93.4 95.1 97.5 96.3  --   PLT 198  --  190 188 166 189  --    < > = values in this interval not displayed.   Basic Metabolic Panel: Recent Labs  Lab 01/16/20 0539 01/17/20 0654 01/18/20 0705 01/20/20 0543 01/22/20 0605  NA 137 138 135 138 135  K 3.8 4.2 3.8 4.0 4.1  CL 105 105 104 104 101  CO2 27 25 26 26 22   GLUCOSE 121* 100* 95 105* 170*  BUN 12 13 12 13 16   CREATININE 0.48 0.43* 0.52 0.53 0.40*  CALCIUM 7.8* 8.4* 8.0* 8.4* 9.0  MG 1.8 1.7 1.8  --   --  GFR: Estimated Creatinine Clearance: 50.5 mL/min (A) (by C-G formula based on SCr of 0.4 mg/dL (L)). CBG: Recent Labs  Lab 01/21/20 0738 01/21/20 1109 01/21/20 1729 01/21/20 2032 01/22/20 0717  GLUCAP 224* 270* 222* 283* 144*     No results found for this or any previous visit (from the past 240 hour(s)).    Imaging Studies   No results found.   Medications   Scheduled Meds: . dexamethasone  4 mg Oral Q8H  . enoxaparin (LOVENOX) injection  40 mg Subcutaneous Q24H  . feeding supplement (GLUCERNA SHAKE)  237 mL Oral TID BM  . ferrous sulfate  325 mg Oral Q breakfast  . gabapentin  600 mg Oral TID  . insulin aspart  0-20 Units Subcutaneous TID WC  . insulin aspart  0-5 Units Subcutaneous QHS  . insulin glargine  5 Units Subcutaneous Daily  . metoprolol tartrate  12.5 mg Oral BID  . mometasone-formoterol  2 puff Inhalation BID  . multivitamin with minerals  1 tablet Oral Daily  . pantoprazole  40 mg Oral Daily  . pravastatin  40 mg Oral q1800  . QUEtiapine  12.5 mg Oral QHS  . senna  1 tablet Oral BID   Continuous Infusions:      LOS: 12 days     [facility]  01/22/2020, 11:12 AM    If 7PM-7AM, please contact  night-coverage. How to contact the [facility] Attending or Consulting provider [place] or covering provider during after hours [place], for this patient?    1. Check the care team in [facility] and look for a) attending/consulting [facility] provider listed and b) the [facility] team listed 2. Log into [email or web address] and use [place] universal password to access. If you do not have the password, please contact the hospital operator. 3. Locate the [facility] provider you are looking for under [facility] and page to a number that you can be directly reached. 4. If you still have difficulty reaching the provider, please page the [facility] (Director on Call) for the Hospitalists listed on amion for assistance.

## 2020-01-22 NOTE — Progress Notes (Signed)
The proposed treatment discussed in cancer conference 01/22/20 is for discussion purpose only and is not a binding recommendation.  The patient was not physically examined nor present for their treatment options.  Therefore, final treatment plans cannot be decided.

## 2020-01-22 NOTE — Progress Notes (Signed)
Nutrition Follow-up  INTERVENTION:   -Glucerna Shake po TID, each supplement provides 220 kcal and 10 grams of protein -Multivitamin with minerals daily  NUTRITION DIAGNOSIS:   Increased nutrient needs related to acute illness (rt arm cellulitis with sepsis) as evidenced by estimated needs.  Ongoing.  GOAL:   Patient will meet greater than or equal to 90% of their needs  Meeting.  MONITOR:   PO intake, Supplement acceptance, Diet advancement, Labs, Weight trends, Skin, I & O's  ASSESSMENT:   [name] is a 79 y.o. female with medical history significant of HTN, CVA, Intraductal carcinoma right breast with mastectomy in 2010 followed by 5 years of horrmonal therapy with a clean follow up 04/29/19, recent hospitalization for cecal diverticulitis with perforation that did not require surgery or drain placement.   6/12: Pt admitted with rt arm cellulitis with sepsis. 6/14: Transferred to [place] for radiation therapy 6/16: started radiation  Patient currently consuming 100% of meals. Pt is also drinking Glucerna shakes.   Admission weight: 138 lbs. No new weights for this admission.  Medications: Decadron, Ferrous sulfate, Multivitamin with minerals daily  Labs reviewed: CBGs: 144-323  Diet Order:   Diet Order            DIET SOFT Room service appropriate? Yes; Fluid consistency: Thin  Diet effective now                 EDUCATION NEEDS:   No education needs have been identified at this time  Skin:  Skin Assessment: Skin Integrity Issues: Skin Integrity Issues:: Other (Comment) Other: rt arm cellulitis  Last BM:  6/24 -type 6  Height:   Ht Readings from Last 1 Encounters:  01/10/20 5\' 5"  (1.651 m)    Weight:   Wt Readings from Last 1 Encounters:  01/10/20 63 kg    Ideal Body Weight:  56.8 kg  BMI:  Body mass index is 23.11 kg/m.  Estimated Nutritional Needs:   Kcal:  1700-1900  Protein:  80-95 grams  Fluid:  > 1.6 L  Clayton Bibles, MS, RD,  LDN Inpatient Clinical Dietitian Contact information available via Amion

## 2020-01-22 NOTE — Care Management Important Message (Signed)
Important Message  Patient Details IM Letter given to [name] Name: [name] MRN: [id number] Date of Birth: 1941-06-07   Medicare Important Message Given:  Yes     Kerin Salen 01/22/2020, 9:48 AM

## 2020-01-23 ENCOUNTER — Encounter (HOSPITAL_COMMUNITY): Payer: Self-pay | Admitting: Internal Medicine

## 2020-01-23 ENCOUNTER — Ambulatory Visit: Payer: Medicare HMO

## 2020-01-23 ENCOUNTER — Ambulatory Visit
Admit: 2020-01-23 | Discharge: 2020-01-23 | Disposition: A | Discharge status: Home / Self Care | Payer: Medicare HMO | Attending: Radiation Oncology | Admitting: Radiation Oncology

## 2020-01-23 ENCOUNTER — Inpatient Hospital Stay (HOSPITAL_COMMUNITY): Payer: Medicare HMO

## 2020-01-23 DIAGNOSIS — C7931 Secondary malignant neoplasm of brain: Secondary | ICD-10-CM

## 2020-01-23 DIAGNOSIS — K6389 Other specified diseases of intestine: Secondary | ICD-10-CM

## 2020-01-23 DIAGNOSIS — K922 Gastrointestinal hemorrhage, unspecified: Secondary | ICD-10-CM

## 2020-01-23 LAB — GLUCOSE, CAPILLARY
Glucose-Capillary: 150 mg/dL — ABNORMAL HIGH (ref 70–99)
Glucose-Capillary: 259 mg/dL — ABNORMAL HIGH (ref 70–99)
Glucose-Capillary: 93 mg/dL (ref 70–99)
Glucose-Capillary: 203 mg/dL — ABNORMAL HIGH (ref 70–99)

## 2020-01-23 LAB — BASIC METABOLIC PANEL
Anion gap: 10 (ref 5–15)
BUN: 19 mg/dL (ref 8–23)
CO2: 23 mmol/L (ref 22–32)
Calcium: 8.3 mg/dL — ABNORMAL LOW (ref 8.9–10.3)
Chloride: 101 mmol/L (ref 98–111)
Creatinine, Ser: 0.54 mg/dL (ref 0.44–1.00)
GFR calc Af Amer: >60 mL/min (ref >60)
GFR calc non Af Amer: >60 mL/min (ref >60)
Glucose, Bld: 170 mg/dL — ABNORMAL HIGH (ref 70–99)
Potassium: 4.2 mmol/L (ref 3.5–5.1)
Sodium: 134 mmol/L — ABNORMAL LOW (ref 135–145)

## 2020-01-23 LAB — CBC
HCT: 29.4 % — ABNORMAL LOW (ref 36.0–46.0)
Hemoglobin: 9.3 g/dL — ABNORMAL LOW (ref 12.0–15.0)
MCH: 30.7 pg (ref 26.0–34.0)
MCHC: 31.6 g/dL (ref 30.0–36.0)
MCV: 97 fL (ref 80.0–100.0)
Platelets: 168 10*3/uL (ref 150–400)
RBC: 3.03 MIL/uL — ABNORMAL LOW (ref 3.87–5.11)
RDW: 17.6 % — ABNORMAL HIGH (ref 11.5–15.5)
WBC: 10.6 10*3/uL — ABNORMAL HIGH (ref 4.0–10.5)
nRBC: 0 % (ref 0.0–0.2)

## 2020-01-23 LAB — HEMOGLOBIN AND HEMATOCRIT, BLOOD
HCT: 27.8 % — ABNORMAL LOW (ref 36.0–46.0)
HCT: 30.2 % — ABNORMAL LOW (ref 36.0–46.0)
Hemoglobin: 8.8 g/dL — ABNORMAL LOW (ref 12.0–15.0)
Hemoglobin: 9.9 g/dL — ABNORMAL LOW (ref 12.0–15.0)

## 2020-01-23 MED ORDER — IOHEXOL 9 MG/ML PO SOLN
ORAL | Status: AC
  Filled 2020-01-23: qty 1000

## 2020-01-23 MED ORDER — IOHEXOL 9 MG/ML PO SOLN
500.0000 mL | ORAL | Status: AC
  Administered 2020-01-23 (×2): 500 mL via ORAL

## 2020-01-23 MED ORDER — SODIUM CHLORIDE (PF) 0.9 % IJ SOLN
INTRAMUSCULAR | Status: AC
  Filled 2020-01-23: qty 50

## 2020-01-23 MED ORDER — IOHEXOL 350 MG/ML SOLN
100.0000 mL | Freq: Once | INTRAVENOUS | Status: AC | PRN
  Administered 2020-01-23: 100 mL via INTRAVENOUS

## 2020-01-23 NOTE — Consult Note (Addendum)
Referring Provider:  [facility]         Primary Care Physician:  Rosita Fire, MD Primary Gastroenterologist: Previously Dr. Carlyon Prows fields         We were asked to see this patient for:   Rectal bleeding               ASSESSMENT /  PLAN    [name] is a 79 y.o. female PMH significant for, but not necessarily limited to, diabetes, adenomatous colon polyps, GERD, right breast cancer, PE, hypertension, stroke, COPD,  hysterectomy, appendectomy   # Painless rectal bleeding / declining hemoglobin / Abnormal CT scan  --Bleeding started yesterday. Unremarkable DRE by TRH. Could be diverticular hemorrhage, anorectal source seems less likely with passage of clots but she does have a history of internal hemorrhoids. Query if related to cecal mass on CT scan.  --Admitted in May 2021 with presumed cecal diverticulitis with possible early perforation.  --Follow-up CT scan this admission shows an enlarging mass associated with anterior cecum measuring 3.9 x 3.7 cm mass concerned for cancer. Resolved inflammation in the area --No masses or cecal polyps on last colonoscopy in 2019.  --Will need repeat colonoscopy. Timing to be announced.   --Monitor hgb, transfuse if needed.   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 Right arm swelling secondary to SVC syndrome.   --getting radiation  HPI:    Chief Complaint: Rectal bleeding  [name] is a 79 y.o. female   03/27/2019 -evaluated at [place] for rectal bleeding and anemia.  No endoscopic evaluation done, she had had a screening colonoscopy less than 1 year prior.  Treated for hemorrhoids, started on oral iron. She was already seeing oncology for breast cancer history so they were to monitor anemia.  10/01/2019 follow-up at [place] for hemorrhoids and iron deficient anemia.  Rectal bleeding had stopped.  She had been diagnosed with low B12 and was on replacement.  Her hemoglobin  had improved to 12 on iron .  Anemia felt to be multifactorial.  She reported loose stool , had been on Metformin for several years.  Stool studies ordered and on 10/03/2018 1 GI pathogen panel was negative.   12/12/2019 admitted with sepsis/acute cecal diverticulitis with possible early perforation.  Treated with IV antibiotics.  General surgery evaluated, no role for surgery at the time.  Discharged to SNF after a week.   01/10/2020 -patient presented to ED. Admitted with recurrent abdominal pain  / right arm cellulitis/sepsis.  CT scan showed a lung lesion not seen on previous chest x-ray.  Also enlarging mass associated with the anterior cecum, currently measuring 3.9 x 3.7 cm, previously 2.9 x 3.1 cm. Surrounding inflammatory changes have resolved. Findings are concerning for primary colon.  Evaluated by general surgery who recommended colonoscopy to confirm diverticulitis and exclude colon cancer.. Pulmonary evaluated lung mass and ordered lung biopsy with was positive for high-grade poorly differentiated squamous cell carcinoma. MRI shows mets to brain. Getting XRT. Yesterday patient began having painless rectal bleeding. Had some loose stools with blood but today stool solid though still with blood / clots.    PREVIOUS ENDOSCOPIC EVALUATIONS / GI STUDIES :  01/12/20 CT scan  abd/ pelvis with contrast --Enlarging mass associated with the anterior cecum, currently measuring 3.9 x 3.7 cm, previously 2.9 x 3.1 cm. Surrounding inflammatory changes have resolved. Findings are concerning for primary colon cancer.  01/09/2018 screening colonoscopy --Dr. Oneida Alar --Two  5 to 7 mm polyps in the ascending colon, removed with a cold snare. Resected and retrieved. - One 8 mm polyp in the descending colon, removed with a hot snare. Resected and retrieved. - Diverticulosis in the recto-sigmoid colon, in the sigmoid colon, in the descending colon, in the transverse colon, at the hepatic flexure and in the  ascending colon. - Internal hemorrhoids. - There was significant looping of the colon --Needs peds colonoscope if repeat colonoscopy ever needed     Diagnosis Colon, polyp(s), ascending, descending - TUBULAR ADENOMA (X6 FRAGMENTS). - NO HIGH GRADE DYSPLASIA OR MALIGNANCY.  Past Medical History:  Diagnosis Date  . Breast CA ([place]) 01/16/2011  . Breast cancer ([place])    rt dx 2010; mastectomy only  . Diverticulitis   . Embolism - blood clot    left lung  . History of prolapse of bladder    patient has gellhorn pessary  . Hypertension   . Infiltrating ductal carcinoma of breast ([place]) 01/16/2011   Started Arimidex on 02/24/2009. Stage IB (T1b N0 M0) grade 1 infiltrating ductal carcinoma of the right breast, status post right modified radical mastectomy on 01/22/2009 for a 10 mm cancer, ER +100%, PR +98%, KI-67 marker was 9%, HER-2 negative, no LVI identified, and 11 lymph nodes were negative, all margins were clear. She had some associated DCIS, is now on Arimidex and she will take that for  . Stroke ([place])   . Tendonitis 07/2010   shot of cortisone    Past Surgical History:  Procedure Laterality Date  . ABDOMINAL HYSTERECTOMY  1987  . APPENDECTOMY  1974   blood clot in lung  . BREAST BIOPSY  1968   left  . COLONOSCOPY N/A 01/11/2018   Procedure: COLONOSCOPY;  Surgeon: Danie Binder, MD;  Location: [facility];  Service: Endoscopy;  Laterality: N/A;  10:00  . MASTECTOMY MODIFIED RADICAL  01/22/2009   rt  . NASAL SINUS SURGERY  2002  . POLYPECTOMY  01/11/2018   Procedure: POLYPECTOMY;  Surgeon: Danie Binder, MD;  Location: [facility];  Service: Endoscopy;;  ascending; descending;  . TEAR DUCT PROBING    . tubal ligation  1972  . YAG LASER APPLICATION Right 5/95/6387   Procedure: YAG LASER APPLICATION;  Surgeon: Williams Che, MD;  Location: [facility] ORS;  Service: Ophthalmology;  Laterality: Right;    Prior to Admission medications   Medication Sig Start Date End Date  Taking? Authorizing Provider  acetaminophen (TYLENOL) 325 MG tablet Take 650 mg by mouth every 6 (six) hours as needed for fever (pain).   Yes [provider]  albuterol (PROVENTIL HFA;VENTOLIN HFA) 108 (90 Base) MCG/ACT inhaler Inhale 1 puff into the lungs every 4 (four) hours as needed for wheezing or shortness of breath.    Yes [provider]  alum & mag hydroxide-simeth (MAALOX/MYLANTA) 200-200-20 MG/5ML suspension Take 20 mLs by mouth every 8 (eight) hours as needed for indigestion or heartburn. Max 60 ml/24 hours   Yes [provider]  aspirin EC 325 MG tablet Take 325 mg by mouth daily. 11/03/19  Yes [provider]  budesonide-formoterol (SYMBICORT) 160-4.5 MCG/ACT inhaler Inhale 2 puffs into the lungs 2 (two) times daily. For shortness of breath   Yes [provider]  calcium-vitamin D (OSCAL WITH D) 500-200 MG-UNIT TABS tablet Take 1 tablet by mouth in the morning, at noon, and at bedtime. 11/03/19  Yes [provider]  Colchicine (MITIGARE) 0.6 MG CAPS Take 0.6 mg  by mouth daily.   Yes [provider]  dicyclomine (BENTYL) 20 MG tablet Take 20 mg by mouth every 6 (six) hours. For abdominal pain   Yes [provider]  ferrous sulfate 325 (65 FE) MG tablet Take 325 mg by mouth daily with breakfast.   Yes [provider]  fluticasone (FLONASE) 50 MCG/ACT nasal spray Place 2 sprays into both nostrils daily as needed for allergies.  01/12/16  Yes [provider]  gabapentin (NEURONTIN) 600 MG tablet Take 1 tablet (600 mg total) by mouth 3 (three) times daily. 12/19/19  Yes Tat, Shanon Brow, MD  linagliptin (TRADJENTA) 5 MG TABS tablet Take 5 mg by mouth daily.   Yes [provider]  lovastatin (MEVACOR) 40 MG tablet Take 40 mg by mouth at bedtime.    Yes [provider]  metFORMIN (GLUCOPHAGE) 500 MG tablet Take 500 mg by mouth 2 (two) times daily with a meal.   Yes [provider]   metoprolol tartrate (LOPRESSOR) 25 MG tablet Take 0.5 tablets (12.5 mg total) by mouth 2 (two) times daily. 12/18/19  Yes Tat, Shanon Brow, MD  metroNIDAZOLE (METROGEL) 0.75 % vaginal gel Place 1 Applicatorful vaginally once a week. one applicatorful to vagina at bedtime weekly , as needed for vaginal discharge with pessary Patient taking differently: Place 1 Applicatorful vaginally every Sunday.  10/31/17  Yes Jonnie Kind, MD  naphazoline-glycerin (CLEAR EYES) 0.012-0.2 % SOLN Place 2 drops into both eyes daily as needed for irritation.   Yes [provider]  omeprazole (PRILOSEC) 20 MG capsule Take 20 mg by mouth daily.  06/28/13  Yes [provider]  phenazopyridine (PYRIDIUM) 100 MG tablet Take 100 mg by mouth every 6 (six) hours as needed (abdominal pain).   Yes [provider]  tiotropium (SPIRIVA) 18 MCG inhalation capsule Place 18 mcg into inhaler and inhale daily.    Yes [provider]  QUEtiapine (SEROQUEL) 25 MG tablet Take 0.5 tablets (12.5 mg total) by mouth at bedtime. Patient not taking: Reported on 01/11/2020 12/17/19   Barton Dubois, MD    Current Facility-Administered Medications  Medication Dose Route Frequency Provider Last Rate Last Admin  . acetaminophen (TYLENOL) tablet 650 mg  650 mg Oral Q6H PRN Neena Rhymes, MD   650 mg at 01/23/20 0941  . albuterol (PROVENTIL) (2.5 MG/3ML) 0.083% nebulizer solution 2.5 mg  2.5 mg Nebulization Q6H PRN Norins, Heinz Knuckles, MD      . dexamethasone (DECADRON) tablet 4 mg  4 mg Oral Q8H Hayden Pedro, PA-C   4 mg at 01/23/20 6945  . feeding supplement (GLUCERNA SHAKE) (GLUCERNA SHAKE) liquid 237 mL  237 mL Oral TID BM Domenic Polite, MD   237 mL at 01/23/20 0931  . ferrous sulfate tablet 325 mg  325 mg Oral Q breakfast Norins, Heinz Knuckles, MD   325 mg at 01/23/20 0732  . gabapentin (NEURONTIN) capsule 600 mg  600 mg Oral TID Neena Rhymes, MD   600 mg at 01/23/20 0932  . HYDROmorphone  (DILAUDID) injection 1 mg  1 mg Intravenous Q3H PRN Domenic Polite, MD      . insulin aspart (novoLOG) injection 0-20 Units  0-20 Units Subcutaneous TID WC Bonnielee Haff, MD   3 Units at 01/23/20 0732  . insulin aspart (novoLOG) injection 0-5 Units  0-5 Units Subcutaneous QHS Bonnielee Haff, MD   3 Units at 01/21/20 2135  . insulin glargine (LANTUS) injection 5 Units  5 Units Subcutaneous Daily  Bonnielee Haff, MD   5 Units at 01/23/20 1123  . iohexol (OMNIPAQUE) 9 MG/ML oral solution 500 mL  500 mL Oral Q1H Bonnielee Haff, MD   500 mL at 01/23/20 1123  . iohexol (OMNIPAQUE) 9 MG/ML oral solution           . ipratropium-albuterol (DUONEB) 0.5-2.5 (3) MG/3ML nebulizer solution 3 mL  3 mL Nebulization Q4H PRN Amin, Ankit Chirag, MD      . metoprolol tartrate (LOPRESSOR) tablet 12.5 mg  12.5 mg Oral BID Norins, Heinz Knuckles, MD   12.5 mg at 01/23/20 0932  . mometasone-formoterol (DULERA) 200-5 MCG/ACT inhaler 2 puff  2 puff Inhalation BID Norins, Heinz Knuckles, MD   2 puff at 01/23/20 0732  . multivitamin with minerals tablet 1 tablet  1 tablet Oral Daily Domenic Polite, MD   1 tablet at 01/23/20 0932  . naphazoline-glycerin (CLEAR EYES REDNESS) ophth solution 2 drop  2 drop Both Eyes Daily PRN Norins, Heinz Knuckles, MD      . oxyCODONE (Oxy IR/ROXICODONE) immediate release tablet 5 mg  5 mg Oral Q4H PRN Amin, Jeanella Flattery, MD   5 mg at 01/20/20 1059  . pantoprazole (PROTONIX) EC tablet 40 mg  40 mg Oral Daily Norins, Heinz Knuckles, MD   40 mg at 01/23/20 0932  . polyethylene glycol (MIRALAX / GLYCOLAX) packet 17 g  17 g Oral Daily PRN Amin, Ankit Chirag, MD      . pravastatin (PRAVACHOL) tablet 40 mg  40 mg Oral q1800 Norins, Heinz Knuckles, MD   40 mg at 01/22/20 1743  . QUEtiapine (SEROQUEL) tablet 12.5 mg  12.5 mg Oral QHS Norins, Heinz Knuckles, MD   12.5 mg at 01/22/20 2127  . senna (SENOKOT) tablet 8.6 mg  1 tablet Oral BID Neena Rhymes, MD   8.6 mg at 01/21/20 2133  . senna-docusate (Senokot-S) tablet 2  tablet  2 tablet Oral QHS PRN Amin, Ankit Chirag, MD      . umeclidinium bromide (INCRUSE ELLIPTA) 62.5 MCG/INH 1 puff  1 puff Inhalation Daily PRN Norins, Heinz Knuckles, MD        Allergies as of 01/10/2020 - Review Complete 01/10/2020  Allergen Reaction Noted  . Pollen extract Other (See Comments) 02/10/2014  . Red dye  01/22/2019    Family History  Problem Relation Age of Onset  . Breast cancer Sister   . Breast cancer Maternal Grandmother   . Colon cancer Daughter 70  . Hypertension Paternal Grandfather   . Stroke Paternal Grandfather   . Other Paternal Grandmother        hardening of arteries  . Other Father        gun shot wound  . Cancer Sister   . Cancer Daughter   . Stroke Son   . Heart attack Son   . Gastric cancer Neg Hx   . Esophageal cancer Neg Hx     Social History   Socioeconomic History  . Marital status: Divorced    Spouse name: Not on file  . Number of children: Not on file  . Years of education: Not on file  . Highest education level: Not on file  Occupational History  . Not on file  Tobacco Use  . Smoking status: Current Every Day Smoker    Packs/day: 0.50    Years: 62.00    Pack years: 31.00    Types: Cigarettes  . Smokeless tobacco: Never Used  Vaping Use  . Vaping Use: Never used  Substance and Sexual Activity  . Alcohol use: Yes    Comment: occ  . Drug use: No  . Sexual activity: Never    Birth control/protection: Surgical    Comment: hyst  Other Topics Concern  . Not on file  Social History Narrative  . Not on file   Social Determinants of Health   Financial Resource Strain:   . Difficulty of Paying Living Expenses:   Food Insecurity:   . Worried About Charity fundraiser in the Last Year:   . Arboriculturist in the Last Year:   Transportation Needs:   . Film/video editor (Medical):   Marland Kitchen Lack of Transportation (Non-Medical):   Physical Activity:   . Days of Exercise per Week:   . Minutes of Exercise per Session:    Stress:   . Feeling of Stress :   Social Connections:   . Frequency of Communication with Friends and Family:   . Frequency of Social Gatherings with Friends and Family:   . Attends Religious Services:   . Active Member of Clubs or Organizations:   . Attends Archivist Meetings:   Marland Kitchen Marital Status:   Intimate Partner Violence:   . Fear of Current or Ex-Partner:   . Emotionally Abused:   Marland Kitchen Physically Abused:   . Sexually Abused:     Review of Systems: All systems reviewed and negative except where noted in HPI.  Physical Exam: Vital signs in last 24 hours: Temp:  [98 F (36.7 C)-98.2 F (36.8 C)] 98.2 F (36.8 C) (06/25 0552) Pulse Rate:  [73-83] 76 (06/25 0937) Resp:  [14-16] 14 (06/25 0552) BP: (112-154)/(57-62) 128/57 (06/25 0937) SpO2:  [96 %-97 %] 96 % (06/25 0552) Last BM Date: 01/23/20 General:   Alert, well-developed,  female in NAD Psych:  Pleasant, cooperative. Normal mood and affect. Eyes:  Pupils equal, sclera clear, no icterus.   Conjunctiva pink. Ears:  Normal auditory acuity. Nose:  No deformity, discharge,  or lesions. Neck:  Supple; no masses Lungs:  Clear throughout to auscultation.   No wheezes, crackles, or rhonchi.  Heart:  Regular rate and rhythm; no murmurs, no lower extremity edema Abdomen:  Soft, mild RLQ tenderness. BS active, no palp mass   Rectal:  Per TRH, not repeated Msk:  Symmetrical without gross deformities. . Neurologic:  Alert and  oriented x4;  grossly normal neurologically. Skin:  Intact without significant lesions or rashes.   Intake/Output from previous day: 06/24 0701 - 06/25 0700 In: 1320 [P.O.:1320] Out: 1800 [Urine:1800] Intake/Output this shift: Total I/O In: 360 [P.O.:360] Out: -   Lab Results: Recent Labs    01/22/20 0605 01/22/20 1046 01/23/20 0542  WBC 12.2*  --  10.6*  HGB 10.0* 10.5* 9.3*  HCT 31.3* 32.9* 29.4*  PLT 189  --  168   BMET Recent Labs    01/22/20 0605 01/23/20 0542  NA 135  134*  K 4.1 4.2  CL 101 101  CO2 22 23  GLUCOSE 170* 170*  BUN 16 19  CREATININE 0.40* 0.54  CALCIUM 9.0 8.3*   LFT No results for input(s): PROT, ALBUMIN, AST, ALT, ALKPHOS, BILITOT, BILIDIR, IBILI in the last 72 hours. PT/INR No results for input(s): LABPROT, INR in the last 72 hours. Hepatitis Panel No results for input(s): HEPBSAG, HCVAB, HEPAIGM, HEPBIGM in the last 72 hours.   . CBC Latest Ref Rng & Units 01/23/2020 01/22/2020 01/22/2020  WBC 4.0 - 10.5 K/uL 10.6(H) - 12.2(H)  Hemoglobin 12.0 - 15.0 g/dL 9.3(L) 10.5(L) 10.0(L)  Hematocrit 36 - 46 % 29.4(L) 32.9(L) 31.3(L)  Platelets 150 - 400 K/uL 168 - 189    . CMP Latest Ref Rng & Units 01/23/2020 01/22/2020 01/20/2020  Glucose 70 - 99 mg/dL 170(H) 170(H) 105(H)  BUN 8 - 23 mg/dL _0 Creatinine 0.44 - 1.00 mg/dL 0.54 0.40(L) 0.53  Sodium 135 - 145 mmol/L 134(L) 135 138  Potassium 3.5 - 5.1 mmol/L 4.2 4.1 4.0  Chloride 98 - 111 mmol/L 101 101 104  CO2 22 - 32 mmol/L _1 Calcium 8.9 - 10.3 mg/dL 8.3(L) 9.0 8.4(L)  Total Protein 6.5 - 8.1 g/dL - - 5.8(L)  Total Bilirubin 0.3 - 1.2 mg/dL - - 0.6  Alkaline Phos 38 - 126 U/L - - 32(L)  AST 15 - 41 U/L - - 17  ALT 0 - 44 U/L - - 15   Studies/Results: No results found.  Active Problems:   Infiltrating ductal carcinoma of breast ([facility])   Type 2 diabetes mellitus with hyperlipidemia ([facility])   Benign essential HTN   Hyperlipidemia   Sepsis ([place])   Cecal diverticulitis   Malignant neoplasm of upper lobe of right lung ([place])   Cellulitis of right upper arm   Brain metastases ([place])    Tye Savoy, NP-C @  01/23/2020, 11:40 AM

## 2020-01-23 NOTE — Progress Notes (Signed)
PT Cancellation Note  Patient Details Name: [name] MRN: [id number] DOB: 08-25-1940   Cancelled Treatment:    Reason Eval/Treat Not Completed: Other (comment) Pt reports bloody BMs this morning and was told to remain in bed.  Will check back as schedule permits.   Jabez Molner,KATHrine E 01/23/2020, 2:49 PM Jannette Spanner PT, DPT Acute Rehabilitation Services Pager: [phone] Office: [phone]

## 2020-01-23 NOTE — Progress Notes (Signed)
[facility] Therapy Treatment Record Phone [phone]   Radiation Therapy was administered to [name] on: 01/23/2020  4:05 PM and was treatment # 9 out of a planned course of 14 treatments.  Radiation Treatment  1). Beam photons with 6-10 energy  2). Brachytherapy None  3). Stereotactic Radiosurgery None  4). Other Radiation None     Ell Tiso A Shira Bobst, RT (T)

## 2020-01-23 NOTE — Progress Notes (Signed)
Occupational Therapy Treatment Patient Details Name: [name] MRN: [id number] DOB: [birth year] Today's Date: 01/23/2020    History of present illness 79 year old with history of DM2, HLD, breast cancer status postmastectomy, HTN, COPD, tobacco use, recent diverticulitis and cecal perforation treated with antibiotic discharged to SNF presented back to the hospital with complains of worsening abdominal pain, found to have new right upper lobe lung mass, extensive adenopathy with concerning SVC syndrome.  Patient was transferred to [facility] for radiation treatment.  Radiation treatment initiated 6/18. Patient found to have brain mets via MRI 6/21.   OT comments  Treatment focused on improving activity tolerance with ambulation and self care tasks. Patient ambulating with min guard and RW. Today patient having unexpected BMs (patient unable to tell when having BM) requiring standing toileting tasks for changing clothing and washing up. Patient does appear somewhat weaker today, with more time for transfers and struggled with donning and doffing socks. If patient continues to weaken due to multiple medical issues she may need to discharge to SNF - due to lack of support at home.   Follow Up Recommendations  Home health OT;Supervision/Assistance - 24 hour    Equipment Recommendations  None recommended by OT    Recommendations for Other Services      Precautions / Restrictions Precautions Precautions: Fall Precaution Comments: visual deficits       Mobility Bed Mobility               General bed mobility comments: supervision. increased time.  Transfers   Equipment used: Rolling walker (2 wheeled)             General transfer comment: RW and min guard to ambulate to bathroom, return to recliner, stand for toileting, and ambulate to other side of the room to look out of the window.    Balance                                           ADL  either performed or assessed with clinical judgement   ADL         Grooming Details (indicate cue type and reason): Ambulated to bathroom to stand at sink and brush hair to work on activity tolerance.               Lower Body Dressing Details (indicate cue type and reason): Pantient able to donn and doff socks today. With more effort and increased time than in prior treatments. Patient able to donn underwear and manage underwear with dressing.     Toileting- Clothing Manipulation and Hygiene: Minimal assistance Toileting - Clothing Manipulation Details (indicate cue type and reason): Patient having bloody stool leaking from rectum without her knowing resulting in needing her buttocks to be cleaned. Therapist assisted with cleaning - due to patient not being able to see the area. Patient able to manage clothing during tasks.     Functional mobility during ADLs: Min guard;Rolling walker       Vision       Perception     Praxis      Cognition Arousal/Alertness: Awake/alert Behavior During Therapy: WFL for tasks assessed/performed Overall Cognitive Status: Within Functional Limits for tasks assessed  Exercises     Shoulder Instructions       General Comments      Pertinent Vitals/ Pain       Pain Assessment: Faces Faces Pain Scale: Hurts a little bit Pain Location: shoulder blades, abdomen Pain Descriptors / Indicators: Aching Pain Intervention(s): Limited activity within patient's tolerance  Home Living                                          Prior Functioning/Environment              Frequency  Min 2X/week        Progress Toward Goals  OT Goals(current goals can now be found in the care plan section)  Progress towards OT goals: Progressing toward goals  Acute Rehab OT Goals Patient Stated Goal: to go home and not need assistance OT Goal Formulation: With  patient Time For Goal Achievement: 01/28/20 Potential to Achieve Goals: [place] Discharge plan remains appropriate;Other (comment)    Co-evaluation          OT goals addressed during session: ADL's and self-care      AM-PAC OT "6 Clicks" Daily Activity     Outcome Measure   Help from another person eating meals?: None Help from another person taking care of personal grooming?: A Little Help from another person toileting, which includes using toliet, bedpan, or urinal?: A Little Help from another person bathing (including washing, rinsing, drying)?: A Little Help from another person to put on and taking off regular upper body clothing?: A Little Help from another person to put on and taking off regular lower body clothing?: A Little 6 Click Score: 19    End of Session Equipment Utilized During Treatment: Gait belt;Rolling walker  OT Visit Diagnosis: Unsteadiness on feet (R26.81)   Activity Tolerance Patient tolerated treatment well   Patient Left in bed (in bed, left in care of transport tech)   Nurse Communication  (okay to see per RN.)        Time: 5003-7048 OT Time Calculation (min): 20 min  Charges: OT General Charges $OT Visit: 1 Visit OT Treatments $Self Care/Home Management : 8-22 mins  Derl Barrow, OTR/L [place]  Office [phone] Pager: [place] 01/23/2020, 4:04 PM

## 2020-01-23 NOTE — Progress Notes (Addendum)
PROGRESS NOTE    [name]   VPX:[id number]  DOB: September 27, 1940  PCP: Rosita Fire, MD    DOA: 01/10/2020 LOS: 3   Brief Narrative   79 year old with history of DM2, HLD, breast cancer status postmastectomy, HTN, COPD, tobacco use, recent diverticulitis and cecal perforation treated with antibiotic discharged to SNF presented back to the hospital with complains of worsening abdominal pain, found to have new right upper lobe lung mass, extensive adenopathy with concerning SVC syndrome.  Patient was transferred to [facility] for radiation treatment.  Biopsy showed poorly differentiated SCC. Blood cultures grew Pasteurella, on Unasyn.    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  She had simulation on 6/15.  First treatment on 6/16.  She will need 14 treatments.  Dr. Sondra Come, radiation oncologist.  MRI brain was done on 6/20 which unfortunately shows at least 4 metastatic lesions in the brain.  Patient and her grandson were informed of this finding.  Discussed with medical oncology who recommended radiation oncology evaluate the patient. Patient to be started on radiation treatment to her brain lesions today.  Radiation oncology has also discussed with neurosurgery and unfortunately not a candidate for surgical intervention.  Her dose of dexamethasone was also increased.    Hematochezia Patient had a large bowel movement yesterday with blood mixed in it.  And then she had another episode last night and again this morning.  Rectal exam shows no external lesions.  Digital examination shows blood.  Patient with recent cecal  perforation and diverticulitis treated with antibiotics.  Last colonoscopy was in 2019 which showed adenomas.  Reason for her hematochezia is not entirely clear.  We will repeat CT scan of her abdomen pelvis.  Discussed with GI who recommends a CT angiogram which has been ordered.  Recheck her hemoglobin this afternoon.  May need to transfuse her.    Intra-abdominal mass associated with the cecum  Recent cecal perforation secondary to diverticulitis.  See above as well.  Sepsis secondary to Pasteurella bacteremia UTI- UCx= Kluyvera Ascorbata Culture data reviewed.  Bacteremia noted only in 1 set of blood cultures.  Culture report also reviewed.  Patient has completed 10-day course of Unasyn.  History of breast cancer -Previously treated with mastectomy in 2010, also was on p.o. medications for 5 years.  Follows outpatient with Dr. Jamey Reas.  Oncology team following.  History of COPD with tobacco use -Bronchodilators as needed, incentive spirometer and flutter valve  Diabetes mellitus type 2 Remains on Lantus and SSI.  Higher dose of steroids likely contributing to elevated glucose levels.  HbA1c was 5.9 in May.  Continue to monitor.    Normocytic anemia Stable hemoglobin.  No evidence of overt bleeding.  Generalized weakness -PT/OT- recommending home with home health  Discussed with the patient's grandchildren yesterday.  They are arranging for transportation for her radiation therapy.  Patient will need to stay in the hospital to complete at least 2 treatments to her brain.  However her hematochezia might delay her discharge.   DVT prophylaxis: Lovenox discontinued due to hematochezia.  SCDs.   Diet:  Diet Orders (From admission, onward)    Start     Ordered   01/12/20 1414  DIET SOFT Room service appropriate? Yes; Fluid consistency: Thin  Diet effective now       Question Answer Comment  Room service appropriate? Yes   Fluid consistency: Thin      01/12/20 1413             Code Status: DNR    Subjective 01/23/20    Patient feels weak and fatigued.  Denies any abdominal pain more than usual.   Disposition Plan & Communication  Status is: Inpatient  Remains inpatient appropriate because:Inpatient level of care appropriate due to severity of illness.  Brain mets requiring radiation treatment.   Dispo:  Patient From: Home  Planned Disposition: Home  Expected discharge date: 01/24/20  Medically stable for discharge: No     Family Communication: Yolanda Bonine and granddaughter updated.   Consults, Procedures, Significant Events   Consultants:   Medical Oncology  Radiation Oncology  Gastroenterology  Procedures:   Radiation therapy  Antimicrobials:   Unasyn    Objective   Vitals:   01/22/20 2021 01/22/20 2048 01/23/20 0552 01/23/20 0937  BP:  137/61 (!) 154/62 (!) 128/57  Pulse:  83 73 76  Resp:  16 14   Temp:  98.2 F (36.8 C) 98.2 F (36.8 C)   TempSrc:  Oral Oral   SpO2: 96% 96% 96%   Weight:      Height:        Intake/Output Summary (Last 24 hours) at 01/23/2020 1207 Last data filed at 01/23/2020 0853 Gross per 24 hour  Intake 1200 ml  Output 1800 ml  Net -600 ml   Filed Weights   01/10/20 1303  Weight: 63 kg    Physical Exam:  General appearance: Awake alert.  In no distress Resp: Clear to auscultation bilaterally.  Normal effort Cardio: S1-S2 is normal regular.  No S3-S4.  No rubs murmurs or bruit GI: Abdomen is soft.  Mildly tender in the right lower quadrant without any rebound rigidity or guarding.  Rectal exam reveals no external lesions.  No hemorrhoids identified.  Digital examination was done without any masses or tenderness appreciated.  Upon removal fresh blood was noted on the finger.  Extremities: No edema.  Full range of motion of lower extremities. Neurologic: Alert and oriented x3.  No focal neurological deficits.        Labs   Data Reviewed: I have personally reviewed following labs and  imaging studies  CBC: Recent Labs  Lab 01/17/20 0654 01/17/20 0654 01/18/20 0705 01/20/20 0543 01/22/20 0605 01/22/20 1046 01/23/20 0542  WBC 8.9  --  8.4 6.0 12.2*  --  10.6*  HGB 10.1*   < > 9.9* 10.1* 10.0* 10.5* 9.3*  HCT 31.0*   < > 31.2* 31.8* 31.3* 32.9* 29.4*  MCV 93.4  --  95.1 97.5 96.3  --  97.0  PLT 190  --  188 166 189  --  168   < > = values in this interval not displayed.   Basic Metabolic Panel: Recent Labs  Lab 01/17/20 0654 01/18/20 0705 01/20/20 0543 01/22/20 0605 01/23/20 0542  NA 138 135 138 135 134*  K 4.2 3.8 4.0 4.1 4.2  CL 105 104 104 101 101  CO2 25 26 26 22 23   GLUCOSE 100* 95 105* 170* 170*  BUN 13 12 13 16 19   CREATININE 0.43* 0.52 0.53 0.40* 0.54  CALCIUM 8.4* 8.0* 8.4* 9.0 8.3*  MG 1.7 1.8  --   --   --    GFR: Estimated  Creatinine Clearance: 50.5 mL/min (by C-G formula based on SCr of 0.54 mg/dL). CBG: Recent Labs  Lab 01/22/20 1722 01/22/20 2015 01/22/20 2300 01/23/20 0709 01/23/20 1136  GLUCAP 87 319* 290* 150* 259*     No results found for this or any previous visit (from the past 240 hour(s)).    Imaging Studies   No results found.   Medications   Scheduled Meds: . dexamethasone  4 mg Oral Q8H  . feeding supplement (GLUCERNA SHAKE)  237 mL Oral TID BM  . ferrous sulfate  325 mg Oral Q breakfast  . gabapentin  600 mg Oral TID  . insulin aspart  0-20 Units Subcutaneous TID WC  . insulin aspart  0-5 Units Subcutaneous QHS  . insulin glargine  5 Units Subcutaneous Daily  . iohexol  500 mL Oral Q1H  . iohexol      . metoprolol tartrate  12.5 mg Oral BID  . mometasone-formoterol  2 puff Inhalation BID  . multivitamin with minerals  1 tablet Oral Daily  . pantoprazole  40 mg Oral Daily  . pravastatin  40 mg Oral q1800  . QUEtiapine  12.5 mg Oral QHS  . senna  1 tablet Oral BID   Continuous Infusions:      LOS: 13 days     [facility]  01/23/2020, 12:07 PM    If 7PM-7AM,  please contact night-coverage. How to contact the [facility] Attending or Consulting provider [place] or covering provider during after hours [place], for this patient?    1. Check the care team in [facility] and look for a) attending/consulting [facility] provider listed and b) the [facility] team listed 2. Log into [email or web address] and use [place]'s universal password to access. If you do not have the password, please contact the hospital operator. 3. Locate the [facility] provider you are looking for under [facility] and page to a number that you can be directly reached. 4. If you still have difficulty reaching the provider, please page the [facility] (Director on Call) for the Hospitalists listed on amion for assistance.

## 2020-01-24 LAB — CBC
HCT: 30.8 % — ABNORMAL LOW (ref 36.0–46.0)
Hemoglobin: 10 g/dL — ABNORMAL LOW (ref 12.0–15.0)
MCH: 30.9 pg (ref 26.0–34.0)
MCHC: 32.5 g/dL (ref 30.0–36.0)
MCV: 95.1 fL (ref 80.0–100.0)
Platelets: 204 10*3/uL (ref 150–400)
RBC: 3.24 MIL/uL — ABNORMAL LOW (ref 3.87–5.11)
RDW: 17.6 % — ABNORMAL HIGH (ref 11.5–15.5)
WBC: 11.9 10*3/uL — ABNORMAL HIGH (ref 4.0–10.5)
nRBC: 0 % (ref 0.0–0.2)

## 2020-01-24 LAB — BASIC METABOLIC PANEL
Anion gap: 12 (ref 5–15)
BUN: 18 mg/dL (ref 8–23)
CO2: 25 mmol/L (ref 22–32)
Calcium: 9 mg/dL (ref 8.9–10.3)
Chloride: 98 mmol/L (ref 98–111)
Creatinine, Ser: 0.47 mg/dL (ref 0.44–1.00)
GFR calc Af Amer: >60 mL/min (ref >60)
GFR calc non Af Amer: >60 mL/min (ref >60)
Glucose, Bld: 151 mg/dL — ABNORMAL HIGH (ref 70–99)
Potassium: 4.8 mmol/L (ref 3.5–5.1)
Sodium: 135 mmol/L (ref 135–145)

## 2020-01-24 LAB — GLUCOSE, CAPILLARY: Glucose-Capillary: 174 mg/dL — ABNORMAL HIGH (ref 70–99)

## 2020-01-24 MED ORDER — SENNA 8.6 MG PO TABS: 1.0000 | ORAL_TABLET | Freq: Two times a day (BID) | ORAL | 0 refills | Status: AC

## 2020-01-24 MED ORDER — DEXAMETHASONE 4 MG PO TABS: 4.0000 mg | ORAL_TABLET | Freq: Three times a day (TID) | ORAL | 0 refills | Status: AC

## 2020-01-24 MED ORDER — OXYCODONE HCL 5 MG PO TABS: 5.0000 mg | ORAL_TABLET | ORAL | 0 refills | Status: AC | PRN

## 2020-01-24 NOTE — Discharge Instructions (Signed)
Metastatic Cancer  Metastatic cancer is cancer that has spread from the place where it started (primary site) to another part of the body. The process of cancer spreading from the primary site is called metastasis. When cancer cells metastasize, they do not change the way they look or the way they affect the body. An example is when primary lung cancer spreads to the brain. This is called metastatic lung cancer, not brain cancer. All types of cancer can spread. Some cancers are more likely to metastasize than others. The most common places that cancers metastasize to are:  Bones.  Liver.  Lungs. What are the causes? Metastasis occurs when cancer cells spread from the primary site to another part of the body. Cancer cells can spread:  Directly from one part of the body to a nearby area (local invasion).  Into a lymph vessel. Cancer cells can be carried through the lymph system to lymph nodes and other parts of the body. The lymph system is a network of vessels and nodes that help protect against infections.  Into the blood vessels. Cancer cells can be carried to other parts of the body through the bloodstream. What increases the risk? The following factors may make you more likely to develop this condition:  The type of cancer that you have.  The stage and grade of your primary cancer at the time of diagnosis.  The grade and stage of the tumor. Grading and staging predict how quickly cancer cells will grow and their chances of metastasis.  A large primary tumor.  A higher grade of tumor.  Deeper growth of tumor.  A tumor that has entered the lymph system. What are the signs or symptoms? Symptoms of this condition include:  Weakness.  Lack of energy.  Pain.  Weight loss.  Trouble breathing.  Fluid buildup in your lungs or abdomen.  Tumor growths that can be felt or seen.  An enlarged liver. Some people with this condition may have no symptoms. How is this  diagnosed? This condition may be diagnosed based on:  Your symptoms.  Physical exam. This may include: ? Blood tests to check for certain substances that are secreted by tumors (tumor markers).  Tumor markers that increase after treatment can indicate metastasis.  Tumor markers may be used to help diagnose metastasis in some cancers, such as colon and prostate cancer.  Not all cancers have tumor markers. ? Imaging studies, such as:  X-rays.  Ultrasound.  MRI.  Other imaging tests, such as CT scans, bone scans, and PET scans. ? Testing tissue that is removed from the new cancer site (biopsy). If the cells are similar to cancer cells from the primary site, this can confirm metastatic cancer. ? Testing fluid samples from the lungs, spine, or abdomen for metastatic cancer cells. How is this treated? There are many options for treating metastatic cancer. Your treatment will depend on:  The type of cancer you have.  How far your cancer has advanced.  Your general health. Treatment may not be able to cure metastatic cancer, but it can often relieve the symptoms. In many cases, you may have a combination of treatments. Options may include:  Surgery.  Medicines that kill cancer cells (chemotherapy).  High-energy rays that kill cancer cells (radiation therapy).  Targeted therapy. This targets specific parts of cancer cells and the area around them to block the growth and the spread of the cancer. Targeted therapy can help to limit the damage to healthy cells.  Hormone  therapy.  Treatments that help your body fight cancer (biologic therapy).  Medicines that help your body's disease-fighting system (immune system) fight cancer cells (immunotherapy).  Freezing cancer cells using gas or liquid that is delivered through a needle (cryoablation).  Destroying cancer cells using high-energy radio waves that are delivered through a needle-like probe (radiofrequency ablation).  A  procedure to block the artery that supplies blood to the tumor, which kills the cancer cells (embolization).  Other medicines to manage symptoms related to cancer or cancer treatments. Follow these instructions at home: Eating and drinking  Some of your treatments might affect your appetite and your ability to chew and swallow. If you are having problems eating, or if you do not have an appetite, meet with a diet and nutrition specialist (dietitian).  If you have side effects that affect eating, it may help to: ? Eat smaller meals and snacks often. ? Drink high-nutrition and high-calorie shakes or supplements. ? Eat bland and soft foods that are easy to eat. ? Avoid foods that are hot, spicy, or hard to swallow. Lifestyle   Do not drink alcohol.  Do not use any products that contain nicotine or tobacco, such as cigarettes and e-cigarettes. If you need help quitting, ask your health care provider. General instructions  Take over-the-counter and prescription medicines only as told by your health care provider. This includes vitamins, supplements, and herbal products.  Work with your health care provider to manage any side effects of treatment.  Keep all follow-up visits as told by your health care provider. This is important. Where to find more information  American Cancer Society: www.cancer.[place] www.cancer.gov Contact a health care provider if you:  Notice that you bruise or bleed easily.  Are losing weight without trying.  Have new or increased fatigue or weakness. Get help right away if you have:  A seizure.  A sudden increase in pain.  A fever.  Shortness of breath.  Chest pain. Summary  Metastatic cancer is cancer that has spread from the place where it started (primary site) to another part of the body.  Cancer cells can spread directly from one part of the body to a nearby area, or they may spread through the lymph system or  the bloodstream.  Your risk for metastatic cancer depends on the type of cancer you have and the stage and grade of your primary cancer.  Treatment may not be able to cure metastatic cancer, but it can often relieve the symptoms. This information is not intended to replace advice given to you by your health care provider. Make sure you discuss any questions you have with your health care provider. Document Revised: 09/09/2018 Document Reviewed: 08/01/2017 Elsevier Patient Education  2020 [employer].

## 2020-01-24 NOTE — Consult Note (Signed)
Reason for Consult: bleeding cecal mass Referring Physician: Darnell Level [name] is an 79 y.o. female.  HPI:  Pt is a lovely 79 yo F who was seen at [facility] ED for fever and RUE cellulitis 01/10/2020.  She had a recent admission for presumed cecal diverticulitis in May 2021 where she was seen by Dr. Constance Haw.  She got better without drain or surgery.  This admit she was treated as a code sepsis.  She was found to have SVC syndrome secondary to lung mass with lung mets and rib erosion.  After falls, she was found to also have brain mets.  She has a history of breast cancer around 9 years ago.    She started having bloody stools and more recent CT shows cecum to be more consistent with cecal tumor with adenopathy.  She also still has some abdominal pain.  Of note, she had colonoscopy by Dr. Oneida Alar in 2019 with small polyps that were removed and diverticuli throughout colon.    This admission she has been getting palliative radiation for SVC syndrome and has started whole brain radiation as well.    We are asked to consult about pros and cons of bowel resection.    Past Medical History:  Diagnosis Date  . Breast CA ([place]) 01/16/2011  . Breast cancer ([place])    rt dx 2010; mastectomy only  . Diverticulitis   . Embolism - blood clot    left lung  . History of prolapse of bladder    patient has gellhorn pessary  . Hypertension   . Infiltrating ductal carcinoma of breast ([place]) 01/16/2011   Started Arimidex on 02/24/2009. Stage IB (T1b N0 M0) grade 1 infiltrating ductal carcinoma of the right breast, status post right modified radical mastectomy on 01/22/2009 for a 10 mm cancer, ER +100%, PR +98%, KI-67 marker was 9%, HER-2 negative, no LVI identified, and 11 lymph nodes were negative, all margins were clear. She had some associated DCIS, is now on Arimidex and she will take that for  . Stroke ([place])   . Tendonitis 07/2010   shot of cortisone    Past Surgical History:  Procedure Laterality  Date  . ABDOMINAL HYSTERECTOMY  1987  . APPENDECTOMY  1974   blood clot in lung  . BREAST BIOPSY  1968   left  . COLONOSCOPY N/A 01/11/2018   Procedure: COLONOSCOPY;  Surgeon: Danie Binder, MD;  Location: [facility] ENDO SUITE;  Service: Endoscopy;  Laterality: N/A;  10:00  . MASTECTOMY MODIFIED RADICAL  01/22/2009   rt  . NASAL SINUS SURGERY  2002  . POLYPECTOMY  01/11/2018   Procedure: POLYPECTOMY;  Surgeon: Danie Binder, MD;  Location: [facility] ENDO SUITE;  Service: Endoscopy;;  ascending; descending;  . TEAR DUCT PROBING    . tubal ligation  1972  . YAG LASER APPLICATION Right 0/27/2536   Procedure: YAG LASER APPLICATION;  Surgeon: Williams Che, MD;  Location: [facility] ORS;  Service: Ophthalmology;  Laterality: Right;    Family History  Problem Relation Age of Onset  . Breast cancer Sister   . Breast cancer Maternal Grandmother   . Colon cancer Daughter 65  . Hypertension Paternal Grandfather   . Stroke Paternal Grandfather   . Other Paternal Grandmother        hardening of arteries  . Other Father        gun shot wound  . Cancer Sister   . Cancer Daughter   . Stroke Son   .  Heart attack Son   . Gastric cancer Neg Hx   . Esophageal cancer Neg Hx     Social History:  reports that she has been smoking cigarettes. She has a 31.00 pack-year smoking history. She has never used smokeless tobacco. She reports current alcohol use. She reports that she does not use drugs.  Allergies:  Allergies  Allergen Reactions  . Pollen Extract Other (See Comments)    Runny nose, watery eyes and ears get stopped up  . Red Dye Nausea And Vomiting    Medications:  Current Meds  Medication Sig  . acetaminophen (TYLENOL) 325 MG tablet Take 650 mg by mouth every 6 (six) hours as needed for fever (pain).  Marland Kitchen albuterol (PROVENTIL HFA;VENTOLIN HFA) 108 (90 Base) MCG/ACT inhaler Inhale 1 puff into the lungs every 4 (four) hours as needed for wheezing or shortness of breath.   Marland Kitchen alum & mag  hydroxide-simeth (MAALOX/MYLANTA) 200-200-20 MG/5ML suspension Take 20 mLs by mouth every 8 (eight) hours as needed for indigestion or heartburn. Max 60 ml/24 hours  . aspirin EC 325 MG tablet Take 325 mg by mouth daily.  . budesonide-formoterol (SYMBICORT) 160-4.5 MCG/ACT inhaler Inhale 2 puffs into the lungs 2 (two) times daily. For shortness of breath  . calcium-vitamin D (OSCAL WITH D) 500-200 MG-UNIT TABS tablet Take 1 tablet by mouth in the morning, at noon, and at bedtime.  . Colchicine (MITIGARE) 0.6 MG CAPS Take 0.6 mg by mouth daily.  Marland Kitchen dicyclomine (BENTYL) 20 MG tablet Take 20 mg by mouth every 6 (six) hours. For abdominal pain  . ferrous sulfate 325 (65 FE) MG tablet Take 325 mg by mouth daily with breakfast.  . fluticasone (FLONASE) 50 MCG/ACT nasal spray Place 2 sprays into both nostrils daily as needed for allergies.   Marland Kitchen gabapentin (NEURONTIN) 600 MG tablet Take 1 tablet (600 mg total) by mouth 3 (three) times daily.  Marland Kitchen linagliptin (TRADJENTA) 5 MG TABS tablet Take 5 mg by mouth daily.  Marland Kitchen lovastatin (MEVACOR) 40 MG tablet Take 40 mg by mouth at bedtime.   . metFORMIN (GLUCOPHAGE) 500 MG tablet Take 500 mg by mouth 2 (two) times daily with a meal.  . metoprolol tartrate (LOPRESSOR) 25 MG tablet Take 0.5 tablets (12.5 mg total) by mouth 2 (two) times daily.  . metroNIDAZOLE (METROGEL) 0.75 % vaginal gel Place 1 Applicatorful vaginally once a week. one applicatorful to vagina at bedtime weekly , as needed for vaginal discharge with pessary (Patient taking differently: Place 1 Applicatorful vaginally every Sunday. )  . naphazoline-glycerin (CLEAR EYES) 0.012-0.2 % SOLN Place 2 drops into both eyes daily as needed for irritation.  Marland Kitchen omeprazole (PRILOSEC) 20 MG capsule Take 20 mg by mouth daily.   . phenazopyridine (PYRIDIUM) 100 MG tablet Take 100 mg by mouth every 6 (six) hours as needed (abdominal pain).  Marland Kitchen tiotropium (SPIRIVA) 18 MCG inhalation capsule Place 18 mcg into inhaler and  inhale daily.      Results for orders placed or performed during the hospital encounter of 01/10/20 (from the past 48 hour(s))  Hemoglobin and hematocrit, blood     Status: Abnormal   Collection Time: 01/22/20 10:46 AM  Result Value Ref Range   Hemoglobin 10.5 (L) 12.0 - 15.0 g/dL   HCT 32.9 (L) 36 - 46 %    Comment: Performed at [facility], [place] [address], [place], [place] [address]  Glucose, capillary     Status: Abnormal   Collection Time: 01/22/20 11:22  AM  Result Value Ref Range   Glucose-Capillary 323 (H) 70 - 99 mg/dL    Comment: Glucose reference range applies only to samples taken after fasting for at least 8 hours.  Glucose, capillary     Status: None   Collection Time: 01/22/20  5:22 PM  Result Value Ref Range   Glucose-Capillary 87 70 - 99 mg/dL    Comment: Glucose reference range applies only to samples taken after fasting for at least 8 hours.  Glucose, capillary     Status: Abnormal   Collection Time: 01/22/20  8:15 PM  Result Value Ref Range   Glucose-Capillary 319 (H) 70 - 99 mg/dL    Comment: Glucose reference range applies only to samples taken after fasting for at least 8 hours.  Glucose, capillary     Status: Abnormal   Collection Time: 01/22/20 11:00 PM  Result Value Ref Range   Glucose-Capillary 290 (H) 70 - 99 mg/dL    Comment: Glucose reference range applies only to samples taken after fasting for at least 8 hours.  CBC     Status: Abnormal   Collection Time: 01/23/20  5:42 AM  Result Value Ref Range   WBC 10.6 (H) 4.0 - 10.5 K/uL   RBC 3.03 (L) 3.87 - 5.11 MIL/uL   Hemoglobin 9.3 (L) 12.0 - 15.0 g/dL   HCT 29.4 (L) 36 - 46 %   MCV 97.0 80.0 - 100.0 fL   MCH 30.7 26.0 - 34.0 pg   MCHC 31.6 30.0 - 36.0 g/dL   RDW 17.6 (H) 11.5 - 15.5 %   Platelets 168 150 - 400 K/uL   nRBC 0.0 0.0 - 0.2 %    Comment: Performed at [facility], [place] [address], [place], [place] [address]  Basic metabolic panel     Status:  Abnormal   Collection Time: 01/23/20  5:42 AM  Result Value Ref Range   Sodium 134 (L) 135 - 145 mmol/L   Potassium 4.2 3.5 - 5.1 mmol/L   Chloride 101 98 - 111 mmol/L   CO2 23 22 - 32 mmol/L   Glucose, Bld 170 (H) 70 - 99 mg/dL    Comment: Glucose reference range applies only to samples taken after fasting for at least 8 hours.   BUN 19 8 - 23 mg/dL   Creatinine, Ser 0.54 0.44 - 1.00 mg/dL   Calcium 8.3 (L) 8.9 - 10.3 mg/dL   GFR calc non Af Amer >60 >60 mL/min   GFR calc Af Amer >60 >60 mL/min   Anion gap 10 5 - 15    Comment: Performed at [facility], [place] [address] [place], [place] [address]  Glucose, capillary     Status: Abnormal   Collection Time: 01/23/20  7:09 AM  Result Value Ref Range   Glucose-Capillary 150 (H) 70 - 99 mg/dL    Comment: Glucose reference range applies only to samples taken after fasting for at least 8 hours.  Glucose, capillary     Status: Abnormal   Collection Time: 01/23/20 11:36 AM  Result Value Ref Range   Glucose-Capillary 259 (H) 70 - 99 mg/dL    Comment: Glucose reference range applies only to samples taken after fasting for at least 8 hours.  Hemoglobin and hematocrit, blood     Status: Abnormal   Collection Time: 01/23/20  1:12 PM  Result Value Ref Range   Hemoglobin 8.8 (L) 12.0 - 15.0 g/dL   HCT 27.8 (L) 36 -  46 %    Comment: Performed at [facility], [place] [address], [place], [place] [address]  Glucose, capillary     Status: None   Collection Time: 01/23/20  4:53 PM  Result Value Ref Range   Glucose-Capillary 93 70 - 99 mg/dL    Comment: Glucose reference range applies only to samples taken after fasting for at least 8 hours.  Hemoglobin and hematocrit, blood     Status: Abnormal   Collection Time: 01/23/20  8:35 PM  Result Value Ref Range   Hemoglobin 9.9 (L) 12.0 - 15.0 g/dL   HCT 30.2 (L) 36 - 46 %    Comment: Performed at [facility], [place] [address] [place], [place]  [address]  Glucose, capillary     Status: Abnormal   Collection Time: 01/23/20 10:11 PM  Result Value Ref Range   Glucose-Capillary 203 (H) 70 - 99 mg/dL    Comment: Glucose reference range applies only to samples taken after fasting for at least 8 hours.  Glucose, capillary     Status: Abnormal   Collection Time: 01/24/20  7:49 AM  Result Value Ref Range   Glucose-Capillary 174 (H) 70 - 99 mg/dL    Comment: Glucose reference range applies only to samples taken after fasting for at least 8 hours.    CT Angio Abd/Pel w/ and/or w/o  Result Date: 01/23/2020 CLINICAL DATA:  GI bleed. Abdominal pain. Lung mass with brain lesions. EXAM: CTA ABDOMEN AND PELVIS WITHOUT AND WITH CONTRAST TECHNIQUE: Multidetector CT imaging of the abdomen and pelvis was performed using the standard protocol during bolus administration of intravenous contrast. Multiplanar reconstructed images and MIPs were obtained and reviewed to evaluate the vascular anatomy. CONTRAST:  135m OMNIPAQUE IOHEXOL 350 MG/ML SOLN COMPARISON:  01/10/2020 and previous FINDINGS: VASCULAR Coronary calcifications. Aorta: Moderate calcified atheromatous plaque throughout. No aneurysm, dissection, or stenosis. Celiac: Patent without evidence of aneurysm, dissection, vasculitis or significant stenosis. SMA: Patent without evidence of aneurysm, dissection, vasculitis or significant stenosis. Renals: Both renal arteries are patent without evidence of aneurysm, dissection, vasculitis, fibromuscular dysplasia or significant stenosis. IMA: Patent without evidence of aneurysm, dissection, vasculitis or significant stenosis. Inflow: Moderate scattered partially calcified atheromatous plaque throughout the iliac arterial system without high-grade stenosis, aneurysm, or dissection. Proximal Outflow: Bilateral common femoral and visualized portions of the superficial and profunda femoral arteries are patent without evidence of aneurysm, dissection, vasculitis or  significant stenosis. Veins: Patent hepatic veins, portal vein, SMV, splenic vein, bilateral renal veins. Unremarkable iliac venous system. IVC patent. Review of the MIP images confirms the above findings. NON-VASCULAR Lower chest: No pleural or pericardial effusion. Pulmonary fibrosis. 2.2 cm lingular nodule, incompletely visualized, previously 2.2 cm. 1.2 cm pleural-based lingular nodule laterally, incompletely visualized. 1.7 cm pleural-based nodule in the posterior basal segment left lower lobe, previously 1.1 cm. Hepatobiliary: No focal liver abnormality is seen. No gallstones, gallbladder wall thickening, or biliary dilatation. Pancreas: Unremarkable. No pancreatic ductal dilatation or surrounding inflammatory changes. Spleen: Normal in size without focal abnormality. Adrenals/Urinary Tract: 1.5 cm right adrenal nodule. Stable upper pole left renal cyst. No hydronephrosis. Urinary bladder physiologically distended. Stomach/Bowel: The stomach is incompletely distended. Small bowel is nondilated. Ulcerated vascular 7.8 cm cecal mass. The colon is nondilated. No convincing active extravasation although sensitivity is decreased in the setting of extensive oral contrast material. Multiple descending and sigmoid diverticula without adjacent inflammatory change or abscess. Lymphatic: Right lower quadrant mesenteric nodes measuring up to 1 cm short axis diameter.  No retroperitoneal or pelvic adenopathy. Reproductive: Status post hysterectomy. No adnexal masses. Other: No ascites.  Left pelvic phleboliths.  No free air. Musculoskeletal: Multilevel Thoracolumbar spondylitic changes. osteitis pubis. No fracture or worrisome bone lesion. IMPRESSION: *Ulcerated 7.8 cm cecal mass, a likely source of GI blood loss. Limited evaluation for active extravasation. *Regional right lower quadrant mesenteric adenopathy, suggesting regional metastatic disease. *Progressive enlargement of a posterior left lower lobe pulmonary nodule.  *Descending and sigmoid diverticulosis. *Pulmonary fibrosis . Coronary and aortic Atherosclerosis (ICD10-I70.0). Electronically Signed   By: Lucrezia Europe M.D.   On: 01/23/2020 15:40    Review of Systems  HENT: Negative.   Eyes: Negative.   Respiratory: Positive for chest tightness.   Cardiovascular: Positive for chest pain (upper right chest).  Gastrointestinal: Positive for abdominal pain and blood in stool. Negative for nausea.  Genitourinary: Positive for dysuria.  Musculoskeletal: Negative.   Allergic/Immunologic: Negative.   Neurological: Negative.   Hematological: Negative.   Psychiatric/Behavioral: Negative.   All other systems reviewed and are negative.  Blood pressure 126/63, pulse 72, temperature 98.5 F (36.9 C), temperature source Oral, resp. rate 15, height 5' 5"  (1.651 m), weight 63 kg, SpO2 97 %. Physical Exam Constitutional:      General: She is not in acute distress.    Appearance: Normal appearance. She is not ill-appearing, toxic-appearing or diaphoretic.  HENT:     Right Ear: External ear normal.     Left Ear: External ear normal.     Nose: Nose normal.     Mouth/Throat:     Mouth: Mucous membranes are moist.     Pharynx: Oropharynx is clear.  Eyes:     General: No scleral icterus.    Extraocular Movements: Extraocular movements intact.     Conjunctiva/sclera: Conjunctivae normal.     Pupils: Pupils are equal, round, and reactive to light.  Cardiovascular:     Rate and Rhythm: Normal rate and regular rhythm.     Pulses: Normal pulses.     Heart sounds: Normal heart sounds.  Pulmonary:     Effort: Pulmonary effort is normal.     Breath sounds: No rhonchi.  Chest:     Chest wall: No tenderness.  Abdominal:     General: Abdomen is flat. There is no distension.     Palpations: There is mass (RLQ mass).     Tenderness: There is abdominal tenderness (RLQ mild). There is no guarding or rebound.     Hernia: No hernia is present.  Musculoskeletal:         General: Swelling (RUE) present. Normal range of motion.     Cervical back: Normal range of motion and neck supple. No rigidity or tenderness.     Right lower leg: No edema.     Left lower leg: No edema.  Lymphadenopathy:     Cervical: No cervical adenopathy.  Skin:    General: Skin is warm and dry.     Capillary Refill: Capillary refill takes 2 to 3 seconds.     Coloration: Skin is pale. Skin is not jaundiced.     Findings: No bruising, erythema or rash.  Neurological:     General: No focal deficit present.     Mental Status: She is alert and oriented to person, place, and time. Mental status is at baseline.     Sensory: No sensory deficit.     Motor: No weakness.     Coordination: Coordination normal.  Psychiatric:  Mood and Affect: Mood normal.        Behavior: Behavior normal.        Thought Content: Thought content normal.        Judgment: Judgment normal.     Assessment/Plan:  Lung cancer with SVC syndrome, brain mets, lung mets and right upper extremity edema Cecal mass with heme positive stools.   ABL anemia H/o breast cancer Family cancer history H/o tobacco abuse.     Patient has a very good outlook despite the grave situation.  She is DNR/DNI which is appropriate.    I do not recommend surgery at this time for the colon mass.  The bleeding she is having has not led to frequent blood transfusions, and she has not had any in the 2 weeks that she has been here.  Should her bleeding become more frequent, we may need to readdress.  It is certainly possible that this is cancer, and it does appear that way on imaging.  Her daughter has had colon cancer.  This would have to be a bit more aggressive to show up like this in the 2 years since her last colonoscopy.  Surgery at this time would interrupt her brain and chest radiation which is critical at this point.    She is also at risk of perforation given the appearance on the scan.  Should she develop free air, her  surgical outcomes would be very poor and risk of abscess/sepsis much higher.    I think her best course of action is palliative care.  She has likely 2 cancers with the lung cancer being incurable at this point.  Radiation and +/- chemo per oncology and rad onc.    Should she have stable lung cancer and still have issues, she might be a candidate for lap assisted or robotic colectomy, but given appearance on scans, I am concerned that the abdominal wall is invaded and may require open operation.    I discussed this with her and she agrees with this plan.    I recommend supportive transfusion for her colon cancer.  Her anemia at this point is stable and not at a point that she needs transfusion.    She needs genetic testing as an outpatient as this could benefit her family.    Stark Klein 01/24/2020, 8:49 AM

## 2020-01-24 NOTE — Progress Notes (Signed)
Subjective: She desires to go home.  Objective: Vital signs in last 24 hours: Temp:  [97.8 F (36.6 C)-98.5 F (36.9 C)] 98.5 F (36.9 C) (06/26 1497) Pulse Rate:  [72-76] 72 (06/26 0614) Resp:  [15-18] 15 (06/26 0614) BP: (126-145)/(57-79) 126/63 (06/26 0614) SpO2:  [96 %-98 %] 97 % (06/26 0614) Last BM Date: 01/23/20  Intake/Output from previous day: 06/25 0701 - 06/26 0700 In: 720 [P.O.:720] Out: -  Intake/Output this shift: No intake/output data recorded.  General appearance: alert and no distress GI: soft, non-tender; bowel sounds normal; no masses,  no organomegaly  Lab Results: Recent Labs    01/22/20 0605 01/22/20 1046 01/23/20 0542 01/23/20 1312 01/23/20 2035  WBC 12.2*  --  10.6*  --   --   HGB 10.0*   < > 9.3* 8.8* 9.9*  HCT 31.3*   < > 29.4* 27.8* 30.2*  PLT 189  --  168  --   --    < > = values in this interval not displayed.   BMET Recent Labs    01/22/20 0605 01/23/20 0542  NA 135 134*  K 4.1 4.2  CL 101 101  CO2 22 23  GLUCOSE 170* 170*  BUN 16 19  CREATININE 0.40* 0.54  CALCIUM 9.0 8.3*   LFT No results for input(s): PROT, ALBUMIN, AST, ALT, ALKPHOS, BILITOT, BILIDIR, IBILI in the last 72 hours. PT/INR No results for input(s): LABPROT, INR in the last 72 hours. Hepatitis Panel No results for input(s): HEPBSAG, HCVAB, HEPAIGM, HEPBIGM in the last 72 hours. C-Diff No results for input(s): CDIFFTOX in the last 72 hours. Fecal Lactopherrin No results for input(s): FECLLACTOFRN in the last 72 hours.  Studies/Results: CT Angio Abd/Pel w/ and/or w/o  Result Date: 01/23/2020 CLINICAL DATA:  GI bleed. Abdominal pain. Lung mass with brain lesions. EXAM: CTA ABDOMEN AND PELVIS WITHOUT AND WITH CONTRAST TECHNIQUE: Multidetector CT imaging of the abdomen and pelvis was performed using the standard protocol during bolus administration of intravenous contrast. Multiplanar reconstructed images and MIPs were obtained and reviewed to evaluate the  vascular anatomy. CONTRAST:  124mL OMNIPAQUE IOHEXOL 350 MG/ML SOLN COMPARISON:  01/10/2020 and previous FINDINGS: VASCULAR Coronary calcifications. Aorta: Moderate calcified atheromatous plaque throughout. No aneurysm, dissection, or stenosis. Celiac: Patent without evidence of aneurysm, dissection, vasculitis or significant stenosis. SMA: Patent without evidence of aneurysm, dissection, vasculitis or significant stenosis. Renals: Both renal arteries are patent without evidence of aneurysm, dissection, vasculitis, fibromuscular dysplasia or significant stenosis. IMA: Patent without evidence of aneurysm, dissection, vasculitis or significant stenosis. Inflow: Moderate scattered partially calcified atheromatous plaque throughout the iliac arterial system without high-grade stenosis, aneurysm, or dissection. Proximal Outflow: Bilateral common femoral and visualized portions of the superficial and profunda femoral arteries are patent without evidence of aneurysm, dissection, vasculitis or significant stenosis. Veins: Patent hepatic veins, portal vein, SMV, splenic vein, bilateral renal veins. Unremarkable iliac venous system. IVC patent. Review of the MIP images confirms the above findings. NON-VASCULAR Lower chest: No pleural or pericardial effusion. Pulmonary fibrosis. 2.2 cm lingular nodule, incompletely visualized, previously 2.2 cm. 1.2 cm pleural-based lingular nodule laterally, incompletely visualized. 1.7 cm pleural-based nodule in the posterior basal segment left lower lobe, previously 1.1 cm. Hepatobiliary: No focal liver abnormality is seen. No gallstones, gallbladder wall thickening, or biliary dilatation. Pancreas: Unremarkable. No pancreatic ductal dilatation or surrounding inflammatory changes. Spleen: Normal in size without focal abnormality. Adrenals/Urinary Tract: 1.5 cm right adrenal nodule. Stable upper pole left renal cyst. No hydronephrosis. Urinary bladder physiologically  distended.  Stomach/Bowel: The stomach is incompletely distended. Small bowel is nondilated. Ulcerated vascular 7.8 cm cecal mass. The colon is nondilated. No convincing active extravasation although sensitivity is decreased in the setting of extensive oral contrast material. Multiple descending and sigmoid diverticula without adjacent inflammatory change or abscess. Lymphatic: Right lower quadrant mesenteric nodes measuring up to 1 cm short axis diameter. No retroperitoneal or pelvic adenopathy. Reproductive: Status post hysterectomy. No adnexal masses. Other: No ascites.  Left pelvic phleboliths.  No free air. Musculoskeletal: Multilevel Thoracolumbar spondylitic changes. osteitis pubis. No fracture or worrisome bone lesion. IMPRESSION: *Ulcerated 7.8 cm cecal mass, a likely source of GI blood loss. Limited evaluation for active extravasation. *Regional right lower quadrant mesenteric adenopathy, suggesting regional metastatic disease. *Progressive enlargement of a posterior left lower lobe pulmonary nodule. *Descending and sigmoid diverticulosis. *Pulmonary fibrosis . Coronary and aortic Atherosclerosis (ICD10-I70.0). Electronically Signed   By: Lucrezia Europe M.D.   On: 01/23/2020 15:40    Medications:  Scheduled: . dexamethasone  4 mg Oral Q8H  . feeding supplement (GLUCERNA SHAKE)  237 mL Oral TID BM  . ferrous sulfate  325 mg Oral Q breakfast  . gabapentin  600 mg Oral TID  . insulin aspart  0-20 Units Subcutaneous TID WC  . insulin aspart  0-5 Units Subcutaneous QHS  . insulin glargine  5 Units Subcutaneous Daily  . metoprolol tartrate  12.5 mg Oral BID  . mometasone-formoterol  2 puff Inhalation BID  . multivitamin with minerals  1 tablet Oral Daily  . pantoprazole  40 mg Oral Daily  . pravastatin  40 mg Oral q1800  . QUEtiapine  12.5 mg Oral QHS  . senna  1 tablet Oral BID   Continuous:   Assessment/Plan: 1) Cecal mass. 2) GI bleed. 3) Metastatic SCC with SVC syndrome.   The patient's HGB is  stable.  She feels well at this time and she decided to go home.  The patient fully understands the gravity of her situation and feels that she wants to die in peace.  I concur with her decision as interventions will ultimately decrease her quality of life.  She recalls that her daughter suffered greatly with colon cancer at the age of 60 and the surgery markedly worsened her decision.  The patient does desire to continue with XRT for palliative purposes.  A hospice consultation can help in this transition for her.  LOS: 14 days   Danford Tat D 01/24/2020, 7:35 AM

## 2020-01-24 NOTE — TOC Progression Note (Signed)
Transition of Care [facility]) - Progression Note    Patient Details  Name: [name] MRN: [id number] Date of Birth: 1941-04-07  [facility] CM/SW Contact  Joaquin Courts, RN Phone Number: 01/24/2020, 12:09 PM  Clinical Narrative:    CM noted TOC referral for hospice.  CM notes patient is undergoing radiation treatment and wishes for HHPT services.  At this time patient would not be appropriate for Home hospice services.  CM notes patient has need set up for [facility] social work, this will ensure a community to referral to hospice once active oncology treatment is complete.    Expected Discharge Plan: [place] Barriers to Discharge: Continued Medical Work up  Expected Discharge Plan and Services Expected Discharge Plan: [place]   Discharge Planning Services: CM Consult Post Acute Care Choice: [place] arrangements for the past 2 months: Single Family Home Expected Discharge Date: 01/24/20                         HH Arranged: PT, Nurse's Aide, Social Work [employer] Agency: [place] Date [facility] Agency Contacted: 01/16/20 Time [place]: [place] Representative spoke with at [place]: [place] ([place]) Interventions    Readmission Risk Interventions Readmission Risk Prevention Plan 01/14/2020 12/15/2019  Transportation Screening Complete Complete  PCP or Specialist Appt within 5-7 Days - Complete  PCP or Specialist Appt within 3-5 Days Complete -  Medication Review (RN CM) - Complete  HRI or Home Care Consult Complete -  Social Work Consult for [place] Planning/Counseling Complete -  Palliative Care Screening Complete -  Medication Review Press photographer) Complete -  Some recent data might be hidden

## 2020-01-24 NOTE — Discharge Summary (Addendum)
[facility]  Physician Discharge Summary   Patient ID: [name] MRN: [id number] DOB/AGE: 03-14-1941 79 y.o.  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 Patient To continue with radiation treatments for now 2. Please refer to hospice if patient decides to stop radiation treatments   Home Health: PT and aide Equipment/Devices: None  CODE STATUS: DNR  DISCHARGE CONDITION: fair  Diet recommendation: Modified carbohydrate  INITIAL HISTORY: 79 year old with history of DM2, HLD, breast cancer status postmastectomy, HTN, COPD, tobacco use, recent diverticulitis and cecal perforation treated with antibiotic discharged to SNF presented back to the hospital with complains of worsening abdominal pain, found to have new right upper lobe lung mass, extensive adenopathy with concerning SVC syndrome. Patient was transferred to [facility] for radiation treatment. Biopsy showed poorly differentiated SCC.Blood cultures grew Pasteurella, on Unasyn.   Consultations:  Medical Oncology  Radiation Oncology  Gastroenterology  Procedures:  Radiation treatment to lung mass as well as brain mets   HOSPITAL COURSE:   Right upper lobe mass with extensive mediastinal adenopathy and early SVC syndrome High-grade poorly differentiated squamous cell carcinoma Right arm swelling secondary to SVC syndrome Metastatic brain lesions -Status post CT-guided biopsy of right upper lung mass 6/14 -Path report-high-grade poorly differentiated squamous cell  carcinoma -Patient seen by medical and radiation oncology.  She was started on radiation treatments.   Based on radiation oncology note from 6/25 she has completed 9 out of 14 treatments to the lung mass. MRI brain was done on 6/20 which unfortunately shows at least 4 metastatic lesions in the brain. She was also started on radiation treatment to the brain for the brain mets.   Her dose of dexamethasone was also increased.    Hematochezia likely secondary to ulcerated cecal mass 2 days ago patient started having blood in the stool.  She underwent a CT angiogram of the abdomen pelvis which suggested that she might be bleeding from an ulcerated cecal mass.  Patient was recently hospitalized at [facility] for cecal perforation and presumed diverticulitis which was treated conservatively.   Due to persistent bleeding gastroenterology was consulted.  There is concern that there could be another mass, either a primary tumor or metastatic lesion in the cecum.  General surgery consultation was recommended.  Discussed with Dr. Barry Dienes with general surgery who evaluated patient this morning.  Dr. Barry Dienes does not think patient is a candidate for any surgical intervention either now or if she were to get worse.   Her bleeding seems to be subsiding. Patient understands and also knows that bleeding could get worse.  Patient would like to go home to be with her family understanding that she could get worse and could die.  Her daughter was also in the room during these discussions.  She supports patient's decision.  Patient at the same time however would like to continue with radiation treatment if she is able to make it to the facility.  But she also knows that it is her choice.   I will notify her primary oncologist in [place] so that if patient decides to stop treatment then hospice  referral can be made.    Intra-abdominal mass associated with the cecum  Recent cecal perforation secondary to  diverticulitis.  See above as well.  Sepsis secondary to Pasteurella bacteremia UTI- UCx= Kluyvera Ascorbata Culture data reviewed.  Bacteremia noted only in 1 set of blood cultures.  Culture report also reviewed.  Patient has completed 10-day course of Unasyn.  History of breast cancer -Previously treated with mastectomy in 2010, also was on p.o. medications for 5 years. Follows outpatient with Dr. Delton Coombes.   History of COPD with tobacco use Continue home medication regimen  Diabetes mellitus type 2, uncontrolled with hyperglycemia Continue home medication regimen.  Worsening glycemic control likely due to steroids.  Normocytic anemia Stable hemoglobin.  No evidence of overt bleeding.  Generalized weakness -PT/OT- recommending home with home health  Patient's prognosis is poor at this time.  She would like to go home to be with her family understanding the risks of getting discharged from the hospital.  Okay for discharge home today.   PERTINENT LABS:  The results of significant diagnostics from this hospitalization (including imaging, microbiology, ancillary and laboratory) are listed below for reference.     Labs:    Basic Metabolic Panel: Recent Labs  Lab 01/18/20 0705 01/20/20 0543 01/22/20 0605 01/23/20 0542 01/24/20 0931  NA 135 138 135 134* 135  K 3.8 4.0 4.1 4.2 4.8  CL 104 104 101 101 98  CO2 26 26 22 23 25   GLUCOSE 95 105* 170* 170* 151*  BUN 12 13 16 19 18   CREATININE 0.52 0.53 0.40* 0.54 0.47  CALCIUM 8.0* 8.4* 9.0 8.3* 9.0  MG 1.8  --   --   --   --    Liver Function Tests: Recent Labs  Lab 01/20/20 0543  AST 17  ALT 15  ALKPHOS 32*  BILITOT 0.6  PROT 5.8*  ALBUMIN 2.8*   CBC: Recent Labs  Lab 01/18/20 0705 01/18/20 0705 01/20/20 0543 01/20/20 0543 01/22/20 0605 01/22/20 0605 01/22/20 1046 01/23/20 0542 01/23/20 1312 01/23/20 2035 01/24/20 0931  WBC 8.4  --  6.0  --  12.2*  --   --  10.6*  --   --  11.9*  HGB  9.9*   < > 10.1*   < > 10.0*   < > 10.5* 9.3* 8.8* 9.9* 10.0*  HCT 31.2*   < > 31.8*   < > 31.3*   < > 32.9* 29.4* 27.8* 30.2* 30.8*  MCV 95.1  --  97.5  --  96.3  --   --  97.0  --   --  95.1  PLT 188  --  166  --  189  --   --  168  --   --  204   < > = values in this interval not displayed.    CBG: Recent Labs  Lab 01/23/20 0709 01/23/20 1136 01/23/20 1653 01/23/20 2211 01/24/20 0749  GLUCAP 150* 259* 93 203* 174*     IMAGING STUDIES CT Chest W Contrast  Result Date: 01/10/2020 CLINICAL DATA:  Fever, hypotension, and abdominal pain. EXAM: CT CHEST, ABDOMEN, AND PELVIS WITH CONTRAST TECHNIQUE: Multidetector CT imaging of the chest, abdomen and pelvis was performed following the standard protocol during bolus administration of intravenous contrast. CONTRAST:  146mL OMNIPAQUE IOHEXOL 300 MG/ML  SOLN COMPARISON:  Chest x-ray from same day. CT abdomen pelvis dated Dec 15, 2019. CT chest dated August 26, 2012. FINDINGS: CT CHEST FINDINGS Cardiovascular: Normal heart size. No pericardial effusion. No  thoracic aortic aneurysm or dissection. Coronary, aortic arch, and branch vessel atherosclerotic vascular disease. No central pulmonary embolism. Mediastinum/Nodes: Conglomerate mediastinal lymphadenopathy measures 6.2 x 3.4 cm and invades and severely narrows the SVC. Enlarged high right paratracheal lymph node measuring 1.9 cm in short axis. Enlarged bilateral hilar lymph nodes measuring up to 1.8 cm in short axis. No enlarged axillary lymph nodes. Prominent 8 mm right internal mamillary lymph node (series 2, image 18). Prior right axillary lymph node dissection. Subcentimeter hypodense nodules in both thyroid lobes. Not clinically significant; no follow-up imaging recommended. The trachea and esophagus demonstrate no significant findings. Lungs/Pleura: Large 5.7 x 5.6 cm mass in the anterior right upper lobe with invasion of the chest wall and right first and second ribs. Focal narrowing of the  right subclavian vein just superior to the mass. Small satellite nodules in the right upper lobe measuring up the mm (series 3, image 54). Multiple nodules in the left upper lobe measuring up to 2.2 cm (series 3, image 68). 9 mm nodule in the left lower lobe (series 3, image 100). Progressive basilar predominant peripheral subpleural reticulation and honeycombing throughout both lungs. No focal consolidation, pleural effusion, or pneumothorax. Musculoskeletal: Direct invasion of the right first and second ribs as above. Severe T1 compression fracture. CT ABDOMEN PELVIS FINDINGS Hepatobiliary: No focal liver abnormality is seen. No gallstones, gallbladder wall thickening, or biliary dilatation. Pancreas: Unremarkable. No pancreatic ductal dilatation or surrounding inflammatory changes. Spleen: Normal in size without focal abnormality. Adrenals/Urinary Tract: The adrenal glands are unremarkable. Unchanged small bilateral renal cysts. No renal calculi or hydronephrosis. Mild circumferential bladder wall thickening. Stomach/Bowel: Progressive circumferential wall thickening cecum with enlarging mass anteriorly, currently measuring 3.9 x 3.7 cm, previously 2.9 x 3.1 cm. Surrounding inflammatory changes have resolved. Diffuse colonic diverticulosis. The stomach and small bowel are unremarkable. Vascular/Lymphatic: Aortic atherosclerosis. No enlarged abdominal or pelvic lymph nodes. Reproductive: Status post hysterectomy. No adnexal masses. Other: No free fluid or pneumoperitoneum. Unchanged small area of fat necrosis near the upper pole the right kidney. Musculoskeletal: No acute or significant osseous findings. IMPRESSION: Chest: 1. Large 5.7 x 5.6 cm mass in the anterior right upper lobe with invasion of the chest wall and right first and second ribs, consistent with primary bronchogenic carcinoma. 2. Ipsi- and contralateral nodal and pulmonary metastatic disease. Conglomerate mediastinal lymphadenopathy significantly  narrows the SVC. 3. Severe T1 compression fracture, possibly pathologic. 4. UIP-type interstitial lung disease, progressed since 2014. 5. Aortic Atherosclerosis (ICD10-I70.0). Abdomen and pelvis: 1. Enlarging mass associated with the anterior cecum, currently measuring 3.9 x 3.7 cm, previously 2.9 x 3.1 cm. Surrounding inflammatory changes have resolved. Findings are concerning for primary colon cancer. Electronically Signed   By: Titus Dubin M.D.   On: 01/10/2020 15:45   CT guided needle placement  Result Date: 01/12/2020 CLINICAL DATA:  Anterior right upper lobe lung mass with rib destruction and chest wall invasion. History of breast carcinoma. EXAM: CT GUIDED CORE BIOPSY OF RIGHT UPPER LOBE LUNG MASS ANESTHESIA/SEDATION: 1.0 mg IV Versed; 25 mcg IV Fentanyl Total Moderate Sedation Time:  13 minutes. The patient's level of consciousness and physiologic status were continuously monitored during the procedure by Radiology nursing. PROCEDURE: The procedure risks, benefits, and alternatives were explained to the patient. Questions regarding the procedure were encouraged and answered. The patient understands and consents to the procedure. A time-out was performed prior to initiating the procedure. CT of the upper chest was performed in a supine position. The right anterior chest wall  was prepped with chlorhexidine in a sterile fashion, and a sterile drape was applied covering the operative field. A sterile gown and sterile gloves were used for the procedure. Local anesthesia was provided with 1% Lidocaine. Under CT guidance, a 79 gauge trocar needle was advanced to the level of an anterior right upper lobe lung mass. After confirming needle tip position, 3 separate coaxial 18 gauge core biopsy samples were obtained and submitted in formalin. Additional CT was performed. COMPLICATIONS: None FINDINGS: Lobulated mass centered in the anterior aspect of the right upper lobe and invading the anterior chest wall  again noted measuring approximately 6.3 cm in greatest diameter. Solid tissue was obtained from the lateral aspect of the mass. No pneumothorax on post biopsy imaging. IMPRESSION: CT-guided core biopsy performed at the level of the large right upper lobe anterior mass invading the anterior chest wall. Electronically Signed   By: Aletta Edouard M.D.   On: 01/12/2020 17:04   MR BRAIN W WO CONTRAST  Result Date: 01/19/2020 CLINICAL DATA:  Non-small cell lung cancer staging. Gait disturbance and speech disturbance. EXAM: MRI HEAD WITHOUT AND WITH CONTRAST TECHNIQUE: Multiplanar, multiecho pulse sequences of the brain and surrounding structures were obtained without and with intravenous contrast. CONTRAST:  79mL GADAVIST GADOBUTROL 1 MMOL/ML IV SOLN COMPARISON:  03/22/2016 FINDINGS: The study is mildly motion degraded. Brain: There is a heterogeneous, mildly enhancing 2.7 cm mass medially in the left cerebellar hemisphere with a small amount of chronic blood products and mild surrounding edema. An enhancing, hemorrhagic mass in the right occipital lobe measures 6.5 x 2.8 x 3.9 cm with moderate surrounding edema, regional sulcal effacement, and mass effect on the right lateral ventricle. There is a peripherally enhancing 0.9 cm mass in the anteromedial right frontal lobe with chronic blood products and minimal edema. There is a new small focus of chronic hemorrhage and subtle T2 FLAIR hyperintensity in the right temporal lobe without definite enhancement (series 9, image 22 and series 10, image 21). No acute infarct, midline shift, or extra-axial fluid collection is identified. Patchy T2 hyperintensities in the cerebral white matter bilaterally have progressed from the prior MRI and are nonspecific but compatible with moderate chronic small vessel ischemic disease. There is moderate cerebral atrophy. Vascular: Major intracranial vascular flow voids are preserved. Skull and upper cervical spine: No suspicious marrow  lesion. Sinuses/Orbits: Right cataract extraction. Small volume secretions in the right maxillary sinus. Minimally code solace a Candis Schatz the ethmoid sinuses and right sphenoid sinus. No significant mastoid fluid. Other: None. IMPRESSION: 1. 3 definite brain metastases including a 6.5 cm right occipital mass with moderate edema. No midline shift. 2. New subcentimeter focus of chronic hemorrhage in the right temporal lobe without definite enhancement, indeterminate for a fourth metastasis versus benign chronic hemorrhage. 3. Motion artifact reduces sensitivity for detection of additional very small lesions, particularly given the mild enhancement of 2 smaller confirmed lesions. 4. Moderate chronic small vessel ischemic disease. Electronically Signed   By: Logan Bores M.D.   On: 01/19/2020 11:47   CT ABDOMEN PELVIS W CONTRAST  Result Date: 01/10/2020 CLINICAL DATA:  Fever, hypotension, and abdominal pain. EXAM: CT CHEST, ABDOMEN, AND PELVIS WITH CONTRAST TECHNIQUE: Multidetector CT imaging of the chest, abdomen and pelvis was performed following the standard protocol during bolus administration of intravenous contrast. CONTRAST:  125mL OMNIPAQUE IOHEXOL 300 MG/ML  SOLN COMPARISON:  Chest x-ray from same day. CT abdomen pelvis dated Dec 15, 2019. CT chest dated August 26, 2012. FINDINGS: CT CHEST  FINDINGS Cardiovascular: Normal heart size. No pericardial effusion. No thoracic aortic aneurysm or dissection. Coronary, aortic arch, and branch vessel atherosclerotic vascular disease. No central pulmonary embolism. Mediastinum/Nodes: Conglomerate mediastinal lymphadenopathy measures 6.2 x 3.4 cm and invades and severely narrows the SVC. Enlarged high right paratracheal lymph node measuring 1.9 cm in short axis. Enlarged bilateral hilar lymph nodes measuring up to 1.8 cm in short axis. No enlarged axillary lymph nodes. Prominent 8 mm right internal mamillary lymph node (series 2, image 18). Prior right axillary lymph  node dissection. Subcentimeter hypodense nodules in both thyroid lobes. Not clinically significant; no follow-up imaging recommended. The trachea and esophagus demonstrate no significant findings. Lungs/Pleura: Large 5.7 x 5.6 cm mass in the anterior right upper lobe with invasion of the chest wall and right first and second ribs. Focal narrowing of the right subclavian vein just superior to the mass. Small satellite nodules in the right upper lobe measuring up the mm (series 3, image 54). Multiple nodules in the left upper lobe measuring up to 2.2 cm (series 3, image 68). 9 mm nodule in the left lower lobe (series 3, image 100). Progressive basilar predominant peripheral subpleural reticulation and honeycombing throughout both lungs. No focal consolidation, pleural effusion, or pneumothorax. Musculoskeletal: Direct invasion of the right first and second ribs as above. Severe T1 compression fracture. CT ABDOMEN PELVIS FINDINGS Hepatobiliary: No focal liver abnormality is seen. No gallstones, gallbladder wall thickening, or biliary dilatation. Pancreas: Unremarkable. No pancreatic ductal dilatation or surrounding inflammatory changes. Spleen: Normal in size without focal abnormality. Adrenals/Urinary Tract: The adrenal glands are unremarkable. Unchanged small bilateral renal cysts. No renal calculi or hydronephrosis. Mild circumferential bladder wall thickening. Stomach/Bowel: Progressive circumferential wall thickening cecum with enlarging mass anteriorly, currently measuring 3.9 x 3.7 cm, previously 2.9 x 3.1 cm. Surrounding inflammatory changes have resolved. Diffuse colonic diverticulosis. The stomach and small bowel are unremarkable. Vascular/Lymphatic: Aortic atherosclerosis. No enlarged abdominal or pelvic lymph nodes. Reproductive: Status post hysterectomy. No adnexal masses. Other: No free fluid or pneumoperitoneum. Unchanged small area of fat necrosis near the upper pole the right kidney. Musculoskeletal:  No acute or significant osseous findings. IMPRESSION: Chest: 1. Large 5.7 x 5.6 cm mass in the anterior right upper lobe with invasion of the chest wall and right first and second ribs, consistent with primary bronchogenic carcinoma. 2. Ipsi- and contralateral nodal and pulmonary metastatic disease. Conglomerate mediastinal lymphadenopathy significantly narrows the SVC. 3. Severe T1 compression fracture, possibly pathologic. 4. UIP-type interstitial lung disease, progressed since 2014. 5. Aortic Atherosclerosis (ICD10-I70.0). Abdomen and pelvis: 1. Enlarging mass associated with the anterior cecum, currently measuring 3.9 x 3.7 cm, previously 2.9 x 3.1 cm. Surrounding inflammatory changes have resolved. Findings are concerning for primary colon cancer. Electronically Signed   By: Titus Dubin M.D.   On: 01/10/2020 15:45   DG Chest Port 1 View  Result Date: 01/10/2020 CLINICAL DATA:  Cough, sepsis EXAM: PORTABLE CHEST 1 VIEW COMPARISON:  Multiple prior studies most recent chest x-ray from 12/12/2019 FINDINGS: Findings in the RIGHT upper chest with increasing confluent density in this area measuring 8.7 x 5.7 cm. Nodule in the LEFT chest at 1.4 cm. Apical scarring. Diffuse increased interstitial markings superimposed on pulmonary emphysema. Signs of atherosclerosis. No evidence of pleural effusion. Cardiomediastinal contours are normal. There is however fullness of the RIGHT hilum Absence of rib shadows of anterior ribs. No additional bony abnormality on limited assessment. IMPRESSION: 1. Suspected mass in the RIGHT upper lobe with anterior rib destruction and associated  LEFT lower lobe nodule. CT of the chest is recommended for further evaluation. 2. Fullness of the RIGHT hilum raising the question of RIGHT hilar adenopathy. 3. Diffused increased interstitial markings could represent chronic lung disease, pneumonitis or lymphangitic tumor is also considered. These results were called by telephone at the time  of interpretation on 01/10/2020 at 2:27 pm to provider [facility] , who verbally acknowledged these results. Electronically Signed   By: Zetta Bills M.D.   On: 01/10/2020 14:24   VAS [place] UPPER EXTREMITY VENOUS DUPLEX  Result Date: 01/11/2020 UPPER VENOUS STUDY  Indications: Pain, Swelling, and Erythema Other Indications: Cellulitis with sepsis. Comparison Study: Prior study from 09/06/2017 is available for comparison Performing Technologist: Sharion Dove RVS  Examination Guidelines: A complete evaluation includes B-mode imaging, spectral Doppler, color Doppler, and power Doppler as needed of all accessible portions of each vessel. Bilateral testing is considered an integral part of a complete examination. Limited examinations for reoccurring indications may be performed as noted.  Right Findings: +----------+------------+---------+-----------+----------+-------+ RIGHT     CompressiblePhasicitySpontaneousPropertiesSummary +----------+------------+---------+-----------+----------+-------+ IJV           Full       Yes       Yes                      +----------+------------+---------+-----------+----------+-------+ Subclavian    Full       Yes       Yes                      +----------+------------+---------+-----------+----------+-------+ Axillary      Full       Yes       Yes                      +----------+------------+---------+-----------+----------+-------+ Brachial      Full       Yes       Yes                      +----------+------------+---------+-----------+----------+-------+ Radial        Full                                          +----------+------------+---------+-----------+----------+-------+ Ulnar         Full                                          +----------+------------+---------+-----------+----------+-------+ Cephalic      Full                                           +----------+------------+---------+-----------+----------+-------+ Basilic       Full                                          +----------+------------+---------+-----------+----------+-------+  Left Findings: +----------+------------+---------+-----------+----------+-------+ LEFT      CompressiblePhasicitySpontaneousPropertiesSummary +----------+------------+---------+-----------+----------+-------+ Subclavian    Full       Yes       Yes                      +----------+------------+---------+-----------+----------+-------+  Summary:  Right: No evidence of deep vein thrombosis in the upper extremity. No evidence of superficial vein thrombosis in the upper extremity.  Left: No evidence of thrombosis in the subclavian.  *See table(s) above for measurements and observations.  Diagnosing physician: Monica Martinez MD Electronically signed by Monica Martinez MD on 01/11/2020 at 11:15:47 AM.    Final    CT Angio Abd/Pel w/ and/or w/o  Result Date: 01/23/2020 CLINICAL DATA:  GI bleed. Abdominal pain. Lung mass with brain lesions. EXAM: CTA ABDOMEN AND PELVIS WITHOUT AND WITH CONTRAST TECHNIQUE: Multidetector CT imaging of the abdomen and pelvis was performed using the standard protocol during bolus administration of intravenous contrast. Multiplanar reconstructed images and MIPs were obtained and reviewed to evaluate the vascular anatomy. CONTRAST:  120mL OMNIPAQUE IOHEXOL 350 MG/ML SOLN COMPARISON:  01/10/2020 and previous FINDINGS: VASCULAR Coronary calcifications. Aorta: Moderate calcified atheromatous plaque throughout. No aneurysm, dissection, or stenosis. Celiac: Patent without evidence of aneurysm, dissection, vasculitis or significant stenosis. SMA: Patent without evidence of aneurysm, dissection, vasculitis or significant stenosis. Renals: Both renal arteries are patent without evidence of aneurysm, dissection, vasculitis, fibromuscular dysplasia or significant stenosis. IMA: Patent  without evidence of aneurysm, dissection, vasculitis or significant stenosis. Inflow: Moderate scattered partially calcified atheromatous plaque throughout the iliac arterial system without high-grade stenosis, aneurysm, or dissection. Proximal Outflow: Bilateral common femoral and visualized portions of the superficial and profunda femoral arteries are patent without evidence of aneurysm, dissection, vasculitis or significant stenosis. Veins: Patent hepatic veins, portal vein, SMV, splenic vein, bilateral renal veins. Unremarkable iliac venous system. IVC patent. Review of the MIP images confirms the above findings. NON-VASCULAR Lower chest: No pleural or pericardial effusion. Pulmonary fibrosis. 2.2 cm lingular nodule, incompletely visualized, previously 2.2 cm. 1.2 cm pleural-based lingular nodule laterally, incompletely visualized. 1.7 cm pleural-based nodule in the posterior basal segment left lower lobe, previously 1.1 cm. Hepatobiliary: No focal liver abnormality is seen. No gallstones, gallbladder wall thickening, or biliary dilatation. Pancreas: Unremarkable. No pancreatic ductal dilatation or surrounding inflammatory changes. Spleen: Normal in size without focal abnormality. Adrenals/Urinary Tract: 1.5 cm right adrenal nodule. Stable upper pole left renal cyst. No hydronephrosis. Urinary bladder physiologically distended. Stomach/Bowel: The stomach is incompletely distended. Small bowel is nondilated. Ulcerated vascular 7.8 cm cecal mass. The colon is nondilated. No convincing active extravasation although sensitivity is decreased in the setting of extensive oral contrast material. Multiple descending and sigmoid diverticula without adjacent inflammatory change or abscess. Lymphatic: Right lower quadrant mesenteric nodes measuring up to 1 cm short axis diameter. No retroperitoneal or pelvic adenopathy. Reproductive: Status post hysterectomy. No adnexal masses. Other: No ascites.  Left pelvic phleboliths.   No free air. Musculoskeletal: Multilevel Thoracolumbar spondylitic changes. osteitis pubis. No fracture or worrisome bone lesion. IMPRESSION: *Ulcerated 7.8 cm cecal mass, a likely source of GI blood loss. Limited evaluation for active extravasation. *Regional right lower quadrant mesenteric adenopathy, suggesting regional metastatic disease. *Progressive enlargement of a posterior left lower lobe pulmonary nodule. *Descending and sigmoid diverticulosis. *Pulmonary fibrosis . Coronary and aortic Atherosclerosis (ICD10-I70.0). Electronically Signed   By: Lucrezia Europe M.D.   On: 01/23/2020 15:40    DISCHARGE EXAMINATION: Vitals:   01/23/20 2215 01/24/20 0614 01/24/20 0920 01/24/20 0925  BP: (!) 145/79 126/63  140/62  Pulse: 72 72 75 74  Resp: 16 15 16 18   Temp: 97.8 F (36.6 C) 98.5 F (36.9 C)  98.3 F (36.8 C)  TempSrc: Oral Oral  Oral  SpO2: 97% 97% 96% 94%  Weight:  Height:       General appearance: Awake alert.  In no distress Resp: Clear to auscultation bilaterally.  Normal effort Cardio: S1-S2 is normal regular.  No S3-S4.  No rubs murmurs or bruit GI: Abdomen is soft.  Mildly tender in the right lower quadrant without any rebound rigidity or guarding.      DISPOSITION: Home with family  Discharge Instructions    Call MD for:  difficulty breathing, headache or visual disturbances   Complete by: As directed    Call MD for:  extreme fatigue   Complete by: As directed    Call MD for:  persistant dizziness or light-headedness   Complete by: As directed    Call MD for:  persistant nausea and vomiting   Complete by: As directed    Call MD for:  severe uncontrolled pain   Complete by: As directed    Call MD for:  temperature >100.4   Complete by: As directed    Discharge instructions   Complete by: As directed    Please contact your oncologist for any use that come up when you are home.  You were cared for by a hospitalist during your hospital stay. If you have any  questions about your discharge medications or the care you received while you were in the hospital after you are discharged, you can call the unit and asked to speak with the hospitalist on call if the hospitalist that took care of you is not available. Once you are discharged, your primary care physician will handle any further medical issues. Please note that NO REFILLS for any discharge medications will be authorized once you are discharged, as it is imperative that you return to your primary care physician (or establish a relationship with a primary care physician if you do not have one) for your aftercare needs so that they can reassess your need for medications and monitor your lab values. If you do not have a primary care physician, you can call [phone] for a physician referral.   Increase activity slowly   Complete by: As directed    No wound care   Complete by: As directed         Allergies as of 01/24/2020      Reactions   Pollen Extract Other (See Comments)   Runny nose, watery eyes and ears get stopped up   Red Dye Nausea And Vomiting      Medication List    STOP taking these medications   aspirin EC 325 MG tablet   QUEtiapine 25 MG tablet Commonly known as: SEROQUEL     TAKE these medications   acetaminophen 325 MG tablet Commonly known as: TYLENOL Take 650 mg by mouth every 6 (six) hours as needed for fever (pain). Notes to patient: 01/24/2020   albuterol 108 (90 Base) MCG/ACT inhaler Commonly known as: VENTOLIN HFA Inhale 1 puff into the lungs every 4 (four) hours as needed for wheezing or shortness of breath. Notes to patient: 01/24/2020   alum & mag hydroxide-simeth 200-200-20 MG/5ML suspension Commonly known as: MAALOX/MYLANTA Take 20 mLs by mouth every 8 (eight) hours as needed for indigestion or heartburn. Max 60 ml/24 hours Notes to patient: 01/24/2020   budesonide-formoterol 160-4.5 MCG/ACT inhaler Commonly known as: SYMBICORT Inhale 2 puffs into the  lungs 2 (two) times daily. For shortness of breath Notes to patient: 01/24/2020   calcium-vitamin D 500-200 MG-UNIT Tabs tablet Commonly known as: OSCAL WITH D Take 1 tablet by mouth in  the morning, at noon, and at bedtime. Notes to patient: 01/24/2020   dexamethasone 4 MG tablet Commonly known as: DECADRON Take 1 tablet (4 mg total) by mouth 3 (three) times daily. Notes to patient: 01/24/2020 @ 4:00 pm    dicyclomine 20 MG tablet Commonly known as: BENTYL Take 20 mg by mouth every 6 (six) hours. For abdominal pain Notes to patient: 01/24/2020   ferrous sulfate 325 (65 FE) MG tablet Take 325 mg by mouth daily with breakfast. Notes to patient: 01/25/2020   fluticasone 50 MCG/ACT nasal spray Commonly known as: FLONASE Place 2 sprays into both nostrils daily as needed for allergies. Notes to patient: 09/26/2019   gabapentin 600 MG tablet Commonly known as: NEURONTIN Take 1 tablet (600 mg total) by mouth 3 (three) times daily. Notes to patient: 01/24/2020 @ 4:00 pm    lovastatin 40 MG tablet Commonly known as: MEVACOR Take 40 mg by mouth at bedtime. Notes to patient: 01/24/2020 @ 10:00 pm    metFORMIN 500 MG tablet Commonly known as: GLUCOPHAGE Take 500 mg by mouth 2 (two) times daily with a meal. Notes to patient: 01/24/2020   metoprolol tartrate 25 MG tablet Commonly known as: LOPRESSOR Take 0.5 tablets (12.5 mg total) by mouth 2 (two) times daily. Notes to patient: 01/24/2020 @ 10:00 pm    metroNIDAZOLE 0.75 % vaginal gel Commonly known as: METROGEL Place 1 Applicatorful vaginally once a week. one applicatorful to vagina at bedtime weekly , as needed for vaginal discharge with pessary What changed:   when to take this  additional instructions   Mitigare 0.6 MG Caps Generic drug: Colchicine Take 0.6 mg by mouth daily. Notes to patient: 01/24/2020   naphazoline-glycerin 0.012-0.2 % Soln Commonly known as: CLEAR EYES REDNESS Place 2 drops into both eyes daily as needed  for irritation.   omeprazole 20 MG capsule Commonly known as: PRILOSEC Take 20 mg by mouth daily. Notes to patient: 01/25/2020    oxyCODONE 5 MG immediate release tablet Commonly known as: Oxy IR/ROXICODONE Take 1 tablet (5 mg total) by mouth every 4 (four) hours as needed for severe pain. Notes to patient: 01/24/2020   phenazopyridine 100 MG tablet Commonly known as: PYRIDIUM Take 100 mg by mouth every 6 (six) hours as needed (abdominal pain). Notes to patient: 01/24/2020   senna 8.6 MG Tabs tablet Commonly known as: SENOKOT Take 1 tablet (8.6 mg total) by mouth 2 (two) times daily. Notes to patient: 01/25/2020   tiotropium 18 MCG inhalation capsule Commonly known as: SPIRIVA Place 18 mcg into inhaler and inhale daily. Notes to patient: 01/25/2020   Tradjenta 5 MG Tabs tablet Generic drug: linagliptin Take 5 mg by mouth daily. Notes to patient: 01/24/2020         Follow-up Information    [facility] Follow up.   Why: [phone]  Call about transportation services in [facility].       Stark Klein, MD Follow up.   Specialty: General Surgery Why: As needed  Contact information: [address] [place] [address] [phone]        Derek Jack, MD Follow up.   Specialty: Hematology Contact information: [place] [address] [place], [facility] Follow up.   Specialty: Home Health Services Why: agency will provide home health services Contact information: [address] [place] [address] [phone]  TOTAL DISCHARGE TIME: 35 minutes  Catalino Plascencia [employer] on [email or web address]  01/24/2020, 1:00 PM

## 2020-01-26 ENCOUNTER — Other Ambulatory Visit: Payer: Self-pay | Admitting: Radiation Oncology

## 2020-01-26 ENCOUNTER — Ambulatory Visit: Payer: Medicare HMO

## 2020-01-26 ENCOUNTER — Ambulatory Visit
Admission: RE | Admit: 2020-01-26 | Discharge: 2020-01-26 | Disposition: A | Discharge status: Home / Self Care | Payer: Medicare HMO | Source: Ambulatory Visit | Attending: Radiation Oncology | Admitting: Radiation Oncology

## 2020-01-26 ENCOUNTER — Other Ambulatory Visit: Payer: Self-pay

## 2020-01-26 ENCOUNTER — Telehealth: Payer: Self-pay | Admitting: *Deleted

## 2020-01-26 DIAGNOSIS — C3490 Malignant neoplasm of unspecified part of unspecified bronchus or lung: Secondary | ICD-10-CM

## 2020-01-26 DIAGNOSIS — I871 Compression of vein: Secondary | ICD-10-CM | POA: Diagnosis not present

## 2020-01-26 DIAGNOSIS — C3411 Malignant neoplasm of upper lobe, right bronchus or lung: Secondary | ICD-10-CM | POA: Diagnosis not present

## 2020-01-26 DIAGNOSIS — Z510 Encounter for antineoplastic radiation therapy: Secondary | ICD-10-CM | POA: Insufficient documentation

## 2020-01-26 NOTE — Telephone Encounter (Signed)
CALLED PATIENT TO INFORM OF PET SCAN FOR 02-02-20 - ARRIVAL TIME- 3 PM @  RADIOLOGY, PATIENT TO HAVE WATER ONLY - 6 HRS. PRIOR TO TEST, PATIENT TO NOT HAVE ANY CARBS 12 HRS. PRIOR TO TEST, SPOKE WITH PATIENT'S GRANDSON - FARON AND HE IS AWARE OF THIS TEST

## 2020-01-27 ENCOUNTER — Ambulatory Visit: Payer: Medicare HMO

## 2020-01-27 ENCOUNTER — Other Ambulatory Visit: Payer: Self-pay

## 2020-01-27 ENCOUNTER — Encounter: Payer: Self-pay | Admitting: *Deleted

## 2020-01-27 ENCOUNTER — Other Ambulatory Visit: Payer: Self-pay | Admitting: *Deleted

## 2020-01-27 ENCOUNTER — Other Ambulatory Visit: Payer: Self-pay | Admitting: Radiation Oncology

## 2020-01-27 ENCOUNTER — Ambulatory Visit
Admission: RE | Admit: 2020-01-27 | Discharge: 2020-01-27 | Disposition: A | Discharge status: Home / Self Care | Payer: Medicare HMO | Source: Ambulatory Visit | Attending: Radiation Oncology | Admitting: Radiation Oncology

## 2020-01-27 DIAGNOSIS — I871 Compression of vein: Secondary | ICD-10-CM | POA: Diagnosis not present

## 2020-01-27 DIAGNOSIS — C3411 Malignant neoplasm of upper lobe, right bronchus or lung: Secondary | ICD-10-CM | POA: Diagnosis not present

## 2020-01-27 DIAGNOSIS — Z510 Encounter for antineoplastic radiation therapy: Secondary | ICD-10-CM | POA: Diagnosis not present

## 2020-01-27 MED ORDER — SUCRALFATE 1 G PO TABS
1.0000 g | ORAL_TABLET | Freq: Three times a day (TID) | ORAL | 0 refills | Status: DC
Start: 2020-01-27 — End: 2020-01-28

## 2020-01-27 NOTE — Patient Outreach (Addendum)
[name]) Care Management  01/27/2020  [name] August 19, 1940 [id number]   Transition of care  Referral date : 01/26/20 Referral source: [facility] Discharge Report Date of Discharge: 01/24/20 Diagnosis:Squamous cell carcinoma lung, right upper lobe Facility: [facility] Insurance: [facility]   Transition of care by Primary Care Provider office  Patient 79 year old female recent hospital admission at [facility] 6/12-6/26. 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 PMHX includes : Breast Cancer , COPD, Diabetes type 2, Normocytic anemia with component of acute blood loss anemia.   Outreach #1 Subjective: Outreach call to patient explained purpose of the call.     Social Patient lives a home with her disabled son [name], she states he is able to fix her an easy breakfast such as cereal.  She report having her grandson [name] that helps with organizing medication, keeping track of her appointments.  Patient discussed family has been providing transportation to her appointments so far, her grandaughter [name] will provide transportation to today's appointment.  Patient states that her  granddaughter will also help with getting her dressed as she doesn't feel strong enough to do that.  Patient reports that she has contact number for transportation service in [facility], she understands that she will have to call them 3 days in advance, she hasn't used that service before. She also states that her grandson has information on [employer] transportation. She denies need for additional transportation service now as she states family will be assisting her with appointments for now .  Patient discussed being glad to just be at home surrounded by her family. Patient discussed that she slept on the  couch last night , it was comfortable,her daughter offered to come an tuck her into bed. Patient report having bedside commode and walker for use at home.   Conditions  Patient discussed having cancer in her lungs, parts of brain and other places, states that she is still trying to adjust to that and no sure of plan after radiation treatments. She discussed feeling weak, using walker in home. She discussed recent condition with stomach and mass in colon.She reports having cramping discomfort in her stomach ,she denies having bloody stool when asked on today. She report having little appetite, eating small amount of cereal this morning complains of feeling pressure in chest when she swallows . She reports taking pain pill as needed and will take one prior to leaving for her treatment on today.  She discussed drinking glucerna during hospital stay and enjoyed that . She reports she has been told she can purchase it at grocery store.  She discussed history of COPD and reports continuing to use inhalers as prescribed, she denies increase in shortness of breath.  Patient states she does have meter at home to monitor her blood sugar has not checked since being at home, stating meter to missed placed while family cleaning preparing for her return home she will have family search for meter.  Patient discussed being strong and hopeful in fighting through current situation but state she has cancer in a few places and knows it may be hard.     Medications  Patient reports that her grandson [name], organizes her medications for her. She states receiving medications through [facility] mail order.She denies cost concerns. She is unable to review medication list at this time.  Appointments Daily radiation therapy appointments through 02/05/20 Dr. Chase Caller, Pulmonary  on July 1 Dr.Katragadda, Oncology  02/05/20   Consent [facility] care management services reviewed, patient agreeable to care management, for support  education and management of current condition states. . Discussed outreach call follow up and contact information.    Plan Will plan return call in the next week to complete assessment and assess for additional community resources needs, such as Palliative care referral.  Placed call to [facility] health, verified start of service is on 6/30 with physical therapy and Social worker on 7/1 patient update. Will review [facility] CM resources for assisting with getting glucerna for patient .Email message to [employer] nutrition.   [facility] CM Care Plan Problem One     Most Recent Value  Care Plan Problem One At risk for readmission related to Cancer diagnosis and management  .   Role Documenting the Problem One Care Management Telephonic Coordinator  Care Plan for Problem One Active  [facility] Long Term Goal  Over the next 60 days patient will report management of pain, nutrition and coping care related to [place] Term Goal Start Date 01/27/20  Interventions for Problem One Long Term Goal Discussed [facility] care managment and follow up, reinforced notifying MD of new worsening symptoms sooner.. Supportive listening.  [facility] CM Short Term Goal #1  Over the next 30 days patient will verbalize measures to help with adequate intake    [facility] CM Short Term Goal #1 Start Date 01/27/20  Interventions for Short Term Goal #1 Discussed current nutrition concerns, appetite, nausea, taste disturbances. Encouraged use of liquid protein supplements. Will follow up on [facility] resources with Glucerna resources.   [facility] CM Short Term Goal #2  Over the next 30 days patient will verbalize control of pain management   [facility] CM Short Term Goal #2 Start Date 01/27/20  Interventions for Short Term Goal #2 Evaluation on current pain assessment , reinforced taking medication as prescribed, and prior to treatment  notifying MD of new or worsening pain , encouraged       Joylene Draft, RN, BSN  [place] Management  Coordinator  [phone]- Mobile [phone]- [place]

## 2020-01-28 ENCOUNTER — Ambulatory Visit
Admission: RE | Admit: 2020-01-28 | Discharge: 2020-01-28 | Disposition: A | Discharge status: Home / Self Care | Payer: Medicare HMO | Source: Ambulatory Visit | Attending: Radiation Oncology | Admitting: Radiation Oncology

## 2020-01-28 ENCOUNTER — Other Ambulatory Visit: Payer: Self-pay

## 2020-01-28 ENCOUNTER — Other Ambulatory Visit: Payer: Self-pay | Admitting: Radiation Oncology

## 2020-01-28 ENCOUNTER — Ambulatory Visit: Payer: Medicare HMO

## 2020-01-28 DIAGNOSIS — E1169 Type 2 diabetes mellitus with other specified complication: Secondary | ICD-10-CM | POA: Diagnosis not present

## 2020-01-28 DIAGNOSIS — R590 Localized enlarged lymph nodes: Secondary | ICD-10-CM | POA: Diagnosis not present

## 2020-01-28 DIAGNOSIS — J449 Chronic obstructive pulmonary disease, unspecified: Secondary | ICD-10-CM | POA: Diagnosis not present

## 2020-01-28 DIAGNOSIS — I10 Essential (primary) hypertension: Secondary | ICD-10-CM | POA: Diagnosis not present

## 2020-01-28 DIAGNOSIS — C3411 Malignant neoplasm of upper lobe, right bronchus or lung: Secondary | ICD-10-CM | POA: Diagnosis not present

## 2020-01-28 DIAGNOSIS — C7951 Secondary malignant neoplasm of bone: Secondary | ICD-10-CM | POA: Diagnosis not present

## 2020-01-28 DIAGNOSIS — Z510 Encounter for antineoplastic radiation therapy: Secondary | ICD-10-CM | POA: Diagnosis not present

## 2020-01-28 DIAGNOSIS — E1165 Type 2 diabetes mellitus with hyperglycemia: Secondary | ICD-10-CM | POA: Diagnosis not present

## 2020-01-28 DIAGNOSIS — E785 Hyperlipidemia, unspecified: Secondary | ICD-10-CM | POA: Diagnosis not present

## 2020-01-28 DIAGNOSIS — I871 Compression of vein: Secondary | ICD-10-CM | POA: Diagnosis not present

## 2020-01-28 DIAGNOSIS — C7931 Secondary malignant neoplasm of brain: Secondary | ICD-10-CM | POA: Diagnosis not present

## 2020-01-28 MED ORDER — SUCRALFATE 1 G PO TABS: 1.0000 g | ORAL_TABLET | Freq: Three times a day (TID) | ORAL | 0 refills | Status: DC

## 2020-01-28 MED ORDER — LIDOCAINE VISCOUS HCL 2 % MT SOLN: 15.0000 mL | OROMUCOSAL | 1 refills | Status: AC | PRN

## 2020-01-29 ENCOUNTER — Ambulatory Visit: Payer: Medicare HMO

## 2020-01-29 ENCOUNTER — Other Ambulatory Visit: Payer: Self-pay

## 2020-01-29 ENCOUNTER — Ambulatory Visit: Payer: Medicare HMO | Admitting: Internal Medicine

## 2020-01-29 ENCOUNTER — Ambulatory Visit
Admission: RE | Admit: 2020-01-29 | Discharge: 2020-01-29 | Disposition: A | Discharge status: Home / Self Care | Payer: Medicare HMO | Source: Ambulatory Visit | Attending: Radiation Oncology | Admitting: Radiation Oncology

## 2020-01-29 ENCOUNTER — Encounter: Payer: Self-pay | Admitting: Internal Medicine

## 2020-01-29 DIAGNOSIS — C3411 Malignant neoplasm of upper lobe, right bronchus or lung: Secondary | ICD-10-CM | POA: Diagnosis not present

## 2020-01-29 DIAGNOSIS — J84112 Idiopathic pulmonary fibrosis: Secondary | ICD-10-CM

## 2020-01-29 DIAGNOSIS — Z510 Encounter for antineoplastic radiation therapy: Secondary | ICD-10-CM | POA: Insufficient documentation

## 2020-01-29 DIAGNOSIS — J849 Interstitial pulmonary disease, unspecified: Secondary | ICD-10-CM

## 2020-01-29 DIAGNOSIS — I871 Compression of vein: Secondary | ICD-10-CM | POA: Diagnosis not present

## 2020-01-29 NOTE — Patient Instructions (Signed)
ICD-10-CM   1. ILD (interstitial lung disease) ([place])  J84.9   2. UIP (usual interstitial pneumonitis) ([place])  J84.112    You have pulmonary fibrosis otherwise call interstitial lung disease The specific variety appears to be a variety called UIP/IPF This can be progressive over time Recent radiation adds to the risk of progression Currently only minimally symptomatic because of this Currently it appears that physical deconditioning and advanced cancer are your bigger problems  Plan -Do blood work ANA, rheumatoid factor, CCP, SCL-70 and SSA/SSB and hypersensitive pneumonitis panel-to look for a reason for her interstitial lung disease -we can call with the results -I agree with a very supportive care approach focusing on his symptoms and quality of life -I agree that antifibrotic's would be too heavy or new infiltrate significant risk of side effects -I respect the fact that we will hold off on overnight oxygen study -Continue to focus on her physical wellbeing and symptom management  Follow-up -You can do follow-up with a regular pulmonologist instead of a pulmonary fibrosis specialist such as me  -I recommend that you be seen by one of my colleagues at the [place] clinic which is closer to your home

## 2020-01-29 NOTE — Progress Notes (Signed)
OV 01/29/2020  Subjective:  Patient ID: [name], female , DOB: 08-Feb-1941 , age 79 y.o. , MRN: [id number] , ADDRESS: [address] [place] [address] PCP Rosita Fire, MD    01/29/2020 -   Chief Complaint  Patient presents with  . Consult    intermittent "cramps" across the chest when eating and/or drinking     HPI   [name] 79 y.o. -frail elderly lady. Accompanied by her grandson Daryl Quiros.  History is gained from review of the chart.  It appears that till May 2020 when she was extremely functional and [place]  Then she got admitted for diverticulitis.  After that she got deconditioned and sent to the nursing home.  Then in mid June 2020 when she got admitted.  At this time a large right upper lobe mass was discovered.  Review of the records indicate she had CT-guided transthoracic needle biopsy of this.  The results show poorly differentiated squamous cell carcinoma.  During this time the CT chest also showed bilateral UIP pattern of interstitial lung disease with classic honeycombing [personally visualized and confirmed].  Therefore she has been referred here.  Since the events of the diverticulitis couple of months ago she is overall lost 20 pounds but might have gained some weight.  She is also fairly deconditioned.  Her ECOG is 3.  She spends most of the time sitting and making any travel can make her sleepy although some of her weight and physical conditioning has regained.  Apparently there of brain mets as well [facility] January 19, 2020 shows 3 brain mets].  She is on steroids and the grandson believes that this is contributed to her weight gain and increased appetite.  She is not on oxygen.  Overall doing well this entire time she is distance have any shortness of breath or cough.  They are unaware of underlying pulmonary fibrosis in the family or even in herself.  There is no feather pillow no mold or mildew exposure.   No pet birds no gerbils or hamsters in the house.  She is now on the verge of completing radiation therapy or has already completed radiation therapy to her lung.  Her oncologist is in [place] area.  Overall they would prefer the care to be in [place] because they live close to that.  She is also deconditioned and needs a walker to get around.  The grandson believes this is related to the brain mets.   CT chest 01/10/20  IMPRESSION: Chest:  1. Large 5.7 x 5.6 cm mass in the anterior right upper lobe with invasion of the chest wall and right first and second ribs, consistent with primary bronchogenic carcinoma. 2. Ipsi- and contralateral nodal and pulmonary metastatic disease. Conglomerate mediastinal lymphadenopathy significantly narrows the SVC. 3. Severe T1 compression fracture, possibly pathologic. 4. UIP-type interstitial lung disease, progressed since 2014. 5. Aortic Atherosclerosis (ICD10-I70.0).  Abdomen and pelvis:  1. Enlarging mass associated with the anterior cecum, currently measuring 3.9 x 3.7 cm, previously 2.9 x 3.1 cm. Surrounding inflammatory changes have resolved. Findings are concerning for primary colon cancer.  Electronically Signed   By: Titus Dubin M.D.   On: 01/10/2020 15:45  ROS - per HPI     has a past medical history of Breast CA ([place]) (01/16/2011), Breast cancer ([place]), Diverticulitis, Embolism - blood clot, History of prolapse of bladder, Hypertension, Infiltrating ductal  carcinoma of breast ([place]) (01/16/2011), Stroke [facility]), and Tendonitis (07/2010).   reports that she has quit smoking. Her smoking use included cigarettes. She has a 31.00 pack-year smoking history. She has never used smokeless tobacco.  Past Surgical History:  Procedure Laterality Date  . ABDOMINAL HYSTERECTOMY  1987  . APPENDECTOMY  1974   blood clot in lung  . BREAST BIOPSY  1968   left  . COLONOSCOPY N/A 01/11/2018   Procedure: COLONOSCOPY;  Surgeon: Danie Binder, MD;  Location: [facility] END[facility];  Service: Endoscopy;  Laterality: N/A;  10:00  . MASTECTOMY MODIFIED RADICAL  01/22/2009   rt  . NASAL SINUS SURGERY  2002  . POLYPECTOMY  01/11/2018   Procedure: POLYPECTOMY;  Surgeon: Danie Binder, MD;  Location: [facility] END[facility] SU[facility];  Service: Endoscopy;;  ascending; descending;  . TEAR DUCT PROBING    . tubal ligation  1972  . YAG LASER APPLICATION Right 0/81/4481   Procedure: YAG LASER APPLICATION;  Surgeon: Williams Che, MD;  Location: [facility] ORS;  Service: Ophthalmology;  Laterality: Right;    Allergies  Allergen Reactions  . Pollen Extract Other (See Comments)    Runny nose, watery eyes and ears get stopped up  . Red Dye Nausea And Vomiting     There is no immunization history on file for this patient.  Family History  Problem Relation Age of Onset  . Breast cancer Sister   . Breast cancer Maternal Grandmother   . Colon cancer Daughter 75  . Hypertension Paternal Grandfather   . Stroke Paternal Grandfather   . Other Paternal Grandmother        hardening of arteries  . Other Father        gun shot wound  . Cancer Sister   . Cancer Daughter   . Stroke Son   . Heart attack Son   . Gastric cancer Neg Hx   . Esophageal cancer Neg Hx      Current Outpatient Medications:  .  acetaminophen (TYLENOL) 325 MG tablet, Take 650 mg by mouth every 6 (six) hours as needed for fever (pain)., Disp: , Rfl:  .  albuterol (PROVENTIL HFA;VENTOLIN HFA) 108 (90 Base) MCG/ACT inhaler, Inhale 1 puff into the lungs every 4 (four) hours as needed for wheezing or shortness of breath. , Disp: , Rfl:  .  alum & mag hydroxide-simeth (MAALOX/MYLANTA) 200-200-20 MG/5ML suspension, Take 20 mLs by mouth every 8 (eight) hours as needed for indigestion or heartburn. Max 60 ml/24 hours, Disp: , Rfl:  .  budesonide-formoterol (SYMBICORT) 160-4.5 MCG/ACT inhaler, Inhale 2 puffs into the lungs 2 (two) times daily. For shortness of breath, Disp: , Rfl:  .  calcium-vitamin  D (OSCAL WITH D) 500-200 MG-UNIT TABS tablet, Take 1 tablet by mouth in the morning, at noon, and at bedtime., Disp: , Rfl:  .  Colchicine (MITIGARE) 0.6 MG CAPS, Take 0.6 mg by mouth daily., Disp: , Rfl:  .  dexamethasone (DECADRON) 4 MG tablet, Take 1 tablet (4 mg total) by mouth 3 (three) times daily., Disp: 90 tablet, Rfl: 0 .  dicyclomine (BENTYL) 20 MG tablet, Take 20 mg by mouth every 6 (six) hours. For abdominal pain, Disp: , Rfl:  .  ferrous sulfate 325 (65 FE) MG tablet, Take 325 mg by mouth daily with breakfast., Disp: , Rfl:  .  fluticasone (FLONASE) 50 MCG/ACT nasal spray, Place 2 sprays into both nostrils daily as needed for allergies. , Disp: , Rfl:  .  gabapentin (NEURONTIN) 600 MG tablet, Take 1 tablet (600 mg total) by mouth 3 (three) times daily., Disp: 90 tablet, Rfl: 0 .  linagliptin (TRADJENTA) 5 MG TABS tablet, Take 5 mg by mouth daily., Disp: , Rfl:  .  lovastatin (MEVACOR) 40 MG tablet, Take 40 mg by mouth at bedtime. , Disp: , Rfl:  .  metFORMIN (GLUCOPHAGE) 500 MG tablet, Take 500 mg by mouth 2 (two) times daily with a meal., Disp: , Rfl:  .  metoprolol tartrate (LOPRESSOR) 25 MG tablet, Take 0.5 tablets (12.5 mg total) by mouth 2 (two) times daily., Disp:  , Rfl:  .  metroNIDAZOLE (METROGEL) 0.75 % vaginal gel, Place 1 Applicatorful vaginally once a week. one applicatorful to vagina at bedtime weekly , as needed for vaginal discharge with pessary (Patient taking differently: Place 1 Applicatorful vaginally every Sunday. ), Disp: 70 g, Rfl: 4 .  naphazoline-glycerin (CLEAR EYES) 0.012-0.2 % SOLN, Place 2 drops into both eyes daily as needed for irritation., Disp: , Rfl:  .  omeprazole (PRILOSEC) 20 MG capsule, Take 20 mg by mouth daily. , Disp: , Rfl:  .  oxyCODONE (OXY IR/ROXICODONE) 5 MG immediate release tablet, Take 1 tablet (5 mg total) by mouth every 4 (four) hours as needed for severe pain., Disp: 30 tablet, Rfl: 0 .  phenazopyridine (PYRIDIUM) 100 MG tablet, Take 100  mg by mouth every 6 (six) hours as needed (abdominal pain)., Disp: , Rfl:  .  senna (SENOKOT) 8.6 MG TABS tablet, Take 1 tablet (8.6 mg total) by mouth 2 (two) times daily., Disp: 120 tablet, Rfl: 0 .  tiotropium (SPIRIVA) 18 MCG inhalation capsule, Place 18 mcg into inhaler and inhale daily. , Disp: , Rfl:  .  lidocaine (XYLOCAINE) 2 % solution, Use as directed 15 mLs in the mouth or throat as needed for mouth pain. Swallow 10-15 min prior to meals (Patient not taking: Reported on 01/29/2020), Disp: 100 mL, Rfl: 1      Objective:   Vitals:   01/29/20 1547  BP: (!) 104/54  Pulse: 82  Temp: 98 F (36.7 C)  TempSrc: Oral  SpO2: 97%  Weight: 114 lb (51.7 kg)  Height: 5\' 6"  (1.676 m)    Estimated body mass index is 18.4 kg/m as calculated from the following:   Height as of this encounter: 5\' 6"  (1.676 m).   Weight as of this encounter: 114 lb (51.7 kg).  @WEIGHTCHANGE @  [employer]   01/29/20 1547  Weight: 114 lb (51.7 kg)     Physical Exam frail elderly female.  Pleasant and oriented no oral thrush.  Sitting in a wheelchair.  Basal crackles present.  No sinus no clubbing no edema.  She appears quite deconditioned.       Assessment:       ICD-10-CM   1. ILD (interstitial lung disease) (HCC)  J84.9 ANA    Rheumatoid factor    Cyclic citrul peptide antibody, IgG    Anti-scleroderma antibody    Sjogrens syndrome-A extractable nuclear antibody    Sjogrens syndrome-B extractable nuclear antibody    Hypersensitivity Pneumonitis    Hypersensitivity Pneumonitis    Sjogrens syndrome-A extractable nuclear antibody    Anti-scleroderma antibody    Cyclic citrul peptide antibody, IgG    Rheumatoid factor    ANA  2. UIP (usual interstitial pneumonitis) (HCC)  J84.112 ANA    Rheumatoid factor    Cyclic citrul peptide antibody, IgG    Anti-scleroderma antibody    Sjogrens syndrome-A  extractable nuclear antibody    Sjogrens syndrome-B extractable nuclear antibody     Hypersensitivity Pneumonitis    Hypersensitivity Pneumonitis    Sjogrens syndrome-A extractable nuclear antibody    Anti-scleroderma antibody    Cyclic citrul peptide antibody, IgG    Rheumatoid factor    ANA   She has classic UIP.  Outside the autoimmune etiologies most likely this is due to IPF.  This is based on the fact she is age greater than 26 and the most common ILD being IPF.  Explained to them this is a progressive condition but at this point in time the lung cancer prognosis appears worse than the ILD prognosis although did explain to them radiation poses a increased risk factor for 5 pulmonary fibrosis getting worse with time.  At this point in time because she feels free of respiratory complaints they do not want to undergo overnight pulse ox testing of walking desaturation testing.  T they are agreeable to having some simple blood work to check for autoimmune antibodies and hypersensitive pneumonitis profile.   Discussed the role of antifibrotic therapy with them. = I do not recommend this.  They are in agreement.    Plan:     Patient Instructions     ICD-10-CM   1. ILD (interstitial lung disease) ([place])  J84.9   2. UIP (usual interstitial pneumonitis) ([place])  J84.112    You have pulmonary fibrosis otherwise call interstitial lung disease The specific variety appears to be a variety called UIP/IPF This can be progressive over time Recent radiation adds to the risk of progression Currently only minimally symptomatic because of this Currently it appears that physical deconditioning and advanced cancer are your bigger problems  Plan -Do blood work ANA, rheumatoid factor, CCP, SCL-70 and SSA/SSB and hypersensitive pneumonitis panel-to look for a reason for her interstitial lung disease -we can call with the results -I agree with a very supportive care approach focusing on his symptoms and quality of life -I agree that antifibrotic's would be too heavy or new infiltrate  significant risk of side effects -I respect the fact that we will hold off on overnight oxygen study -Continue to focus on her physical wellbeing and symptom management  Follow-up -You can do follow-up with a regular pulmonologist instead of a pulmonary fibrosis specialist such as me  -I recommend that you be seen by one of my colleagues at the [facility] clinic which is closer to your home   ( Level 05 visit: Estb 40-54 min   in  visit type: on-site physical face to visit  in total care time and counseling or/and coordination of care by this undersigned MD - Dr Brand Males. This includes one or more of the following on this same day 01/29/2020: pre-charting, chart review, note writing, documentation discussion of test results, diagnostic or treatment recommendations, prognosis, risks and benefits of management options, instructions, education, compliance or risk-factor reduction. It excludes time spent by the [place] or office staff in the care of the patient. Actual time 34 min)    SIGNATURE    Dr. Brand Males, M.D., F.C.C.P,  Pulmonary and Critical Care Medicine Staff Physician, [place] Director - Interstitial Lung Disease  Program  Pulmonary [place] at [place], [place], [address]  Pager: [phone], If no answer or between  15:00h - 7:00h: call [phone] Telephone: [phone]  6:24 PM 01/29/2020

## 2020-01-30 ENCOUNTER — Ambulatory Visit: Payer: Medicare HMO

## 2020-01-30 ENCOUNTER — Other Ambulatory Visit: Payer: Self-pay

## 2020-01-30 ENCOUNTER — Ambulatory Visit
Admission: RE | Admit: 2020-01-30 | Discharge: 2020-01-30 | Disposition: A | Discharge status: Home / Self Care | Payer: Medicare HMO | Source: Ambulatory Visit | Attending: Radiation Oncology | Admitting: Radiation Oncology

## 2020-01-30 DIAGNOSIS — C7931 Secondary malignant neoplasm of brain: Secondary | ICD-10-CM | POA: Diagnosis not present

## 2020-01-30 DIAGNOSIS — J449 Chronic obstructive pulmonary disease, unspecified: Secondary | ICD-10-CM | POA: Diagnosis not present

## 2020-01-30 DIAGNOSIS — E785 Hyperlipidemia, unspecified: Secondary | ICD-10-CM | POA: Diagnosis not present

## 2020-01-30 DIAGNOSIS — E1169 Type 2 diabetes mellitus with other specified complication: Secondary | ICD-10-CM | POA: Diagnosis not present

## 2020-01-30 DIAGNOSIS — I871 Compression of vein: Secondary | ICD-10-CM | POA: Diagnosis not present

## 2020-01-30 DIAGNOSIS — R590 Localized enlarged lymph nodes: Secondary | ICD-10-CM | POA: Diagnosis not present

## 2020-01-30 DIAGNOSIS — Z510 Encounter for antineoplastic radiation therapy: Secondary | ICD-10-CM | POA: Diagnosis not present

## 2020-01-30 DIAGNOSIS — C3411 Malignant neoplasm of upper lobe, right bronchus or lung: Secondary | ICD-10-CM | POA: Diagnosis not present

## 2020-01-30 DIAGNOSIS — E1165 Type 2 diabetes mellitus with hyperglycemia: Secondary | ICD-10-CM | POA: Diagnosis not present

## 2020-01-30 DIAGNOSIS — C7951 Secondary malignant neoplasm of bone: Secondary | ICD-10-CM | POA: Diagnosis not present

## 2020-01-30 LAB — SJOGRENS SYNDROME-B EXTRACTABLE NUCLEAR ANTIBODY: SSB (La) (ENA) Antibody, IgG: <1 AI

## 2020-01-30 LAB — RHEUMATOID FACTOR: Rheumatoid fact SerPl-aCnc: <14 IU/mL (ref <14)

## 2020-01-30 LAB — ANTI-SCLERODERMA ANTIBODY: Scleroderma (Scl-70) (ENA) Antibody, IgG: <1 AI

## 2020-01-30 LAB — SJOGRENS SYNDROME-A EXTRACTABLE NUCLEAR ANTIBODY: SSA (Ro) (ENA) Antibody, IgG: >8 AI — AB

## 2020-01-30 LAB — CYCLIC CITRUL PEPTIDE ANTIBODY, IGG: Cyclic Citrullin Peptide Ab: <16 UNITS

## 2020-02-02 ENCOUNTER — Other Ambulatory Visit: Payer: Self-pay

## 2020-02-02 ENCOUNTER — Encounter (HOSPITAL_COMMUNITY)
Admission: RE | Admit: 2020-02-02 | Discharge: 2020-02-02 | Disposition: A | Discharge status: Home / Self Care | Payer: Medicare HMO | Source: Ambulatory Visit | Attending: Radiation Oncology | Admitting: Radiation Oncology

## 2020-02-02 DIAGNOSIS — C3411 Malignant neoplasm of upper lobe, right bronchus or lung: Secondary | ICD-10-CM | POA: Diagnosis not present

## 2020-02-02 DIAGNOSIS — I871 Compression of vein: Secondary | ICD-10-CM | POA: Diagnosis not present

## 2020-02-02 DIAGNOSIS — E1169 Type 2 diabetes mellitus with other specified complication: Secondary | ICD-10-CM | POA: Diagnosis not present

## 2020-02-02 DIAGNOSIS — J449 Chronic obstructive pulmonary disease, unspecified: Secondary | ICD-10-CM | POA: Diagnosis not present

## 2020-02-02 DIAGNOSIS — C7951 Secondary malignant neoplasm of bone: Secondary | ICD-10-CM | POA: Diagnosis not present

## 2020-02-02 DIAGNOSIS — C3490 Malignant neoplasm of unspecified part of unspecified bronchus or lung: Secondary | ICD-10-CM | POA: Diagnosis not present

## 2020-02-02 DIAGNOSIS — C7931 Secondary malignant neoplasm of brain: Secondary | ICD-10-CM | POA: Diagnosis not present

## 2020-02-02 DIAGNOSIS — E785 Hyperlipidemia, unspecified: Secondary | ICD-10-CM | POA: Diagnosis not present

## 2020-02-02 DIAGNOSIS — E1165 Type 2 diabetes mellitus with hyperglycemia: Secondary | ICD-10-CM | POA: Diagnosis not present

## 2020-02-02 DIAGNOSIS — R590 Localized enlarged lymph nodes: Secondary | ICD-10-CM | POA: Diagnosis not present

## 2020-02-02 DIAGNOSIS — C7802 Secondary malignant neoplasm of left lung: Secondary | ICD-10-CM | POA: Diagnosis not present

## 2020-02-02 MED ORDER — FLUDEOXYGLUCOSE F - 18 (FDG) INJECTION
9.6100 | Freq: Once | INTRAVENOUS | Status: AC | PRN
  Administered 2020-02-02: 9.61 via INTRAVENOUS

## 2020-02-03 ENCOUNTER — Other Ambulatory Visit: Payer: Self-pay | Admitting: Radiation Oncology

## 2020-02-03 ENCOUNTER — Other Ambulatory Visit: Payer: Self-pay | Admitting: *Deleted

## 2020-02-03 ENCOUNTER — Ambulatory Visit
Admission: RE | Admit: 2020-02-03 | Discharge: 2020-02-03 | Disposition: A | Discharge status: Home / Self Care | Payer: Medicare HMO | Source: Ambulatory Visit | Attending: Radiation Oncology | Admitting: Radiation Oncology

## 2020-02-03 ENCOUNTER — Ambulatory Visit: Payer: Medicare HMO

## 2020-02-03 DIAGNOSIS — I871 Compression of vein: Secondary | ICD-10-CM | POA: Diagnosis not present

## 2020-02-03 DIAGNOSIS — C3411 Malignant neoplasm of upper lobe, right bronchus or lung: Secondary | ICD-10-CM | POA: Diagnosis not present

## 2020-02-03 DIAGNOSIS — Z510 Encounter for antineoplastic radiation therapy: Secondary | ICD-10-CM | POA: Diagnosis not present

## 2020-02-03 MED ORDER — CLOTRIMAZOLE 10 MG MT TROC
10.0000 mg | Freq: Every day | OROMUCOSAL | 1 refills | Status: AC
Start: 2020-02-03 — End: ?

## 2020-02-03 NOTE — Patient Outreach (Signed)
[facility]) Care Management  [place]  02/03/2020   [name] 26-Aug-1940 [id number]   Transition of care  Referral date : 01/26/20 Referral source: [facility] Discharge Report Date of Discharge: 01/24/20 Diagnosis:Squamous cell carcinoma lung, right upper lobe Facility: [facility] Insurance: [employer] [facility]   Transition of care by Primary Care Provider office  Telephone Assessment   Patient 80 year old female recent hospital admission at [facility] 6/12-6/26. 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 PMHX includes : Breast Cancer , COPD, Diabetes type 2, Normocytic anemia with component of acute blood loss anemia.     Subjective: Successful outreach call to patient , she reports feeling okay. She denies having problems with shortness of breath . She discussed appetite and intake improved from last week. She reports family being very supportive and providing foods meals She discussed receiving meals delivered prior to hospitalization but her son cancelled meals after her hospital admission she is unsure if she wants to restart them.   Patient discussed having 3 more radiation treatments then she will meet with oncology for next plans. She reports being determined to get stronger.   Patient discussed taking medications as prescribed her son [name] helps with organizing them and she just takes them , she is agreeable that I speak with him regarding her medications.   Successful outreach call to patient [name], HIPAA identity confirmed.  He discussed patient he organizes medications per discharge instructions denies cost concerns. He reports that he plans to attend patient visit with Oncology. He discussed family providing patient meals at this time and does not feel  delivered meals needed.    Encounter Medications:  Outpatient Encounter Medications as of 02/03/2020  Medication Sig Note  . acetaminophen (TYLENOL) 325 MG tablet Take 650 mg by mouth every 6 (six) hours as needed for fever (pain).   Marland Kitchen albuterol (PROVENTIL HFA;VENTOLIN HFA) 108 (90 Base) MCG/ACT inhaler Inhale 1 puff into the lungs every 4 (four) hours as needed for wheezing or shortness of breath.    Marland Kitchen alum & mag hydroxide-simeth (MAALOX/MYLANTA) 200-200-20 MG/5ML suspension Take 20 mLs by mouth every 8 (eight) hours as needed for indigestion or heartburn. Max 60 ml/24 hours   . budesonide-formoterol (SYMBICORT) 160-4.5 MCG/ACT inhaler Inhale 2 puffs into the lungs 2 (two) times daily. For shortness of breath 01/11/2020: 90 day supply filled 11/14/2019  . calcium-vitamin D (OSCAL WITH D) 500-200 MG-UNIT TABS tablet Take 1 tablet by mouth in the morning, at noon, and at bedtime. 01/10/2020: Last dispensed 4/5, 90 days supply  . dexamethasone (DECADRON) 4 MG tablet Take 1 tablet (4 mg total) by mouth 3 (three) times daily.   Marland Kitchen dicyclomine (BENTYL) 20 MG tablet Take 20 mg by mouth every 6 (six) hours. For abdominal pain   . ferrous sulfate 325 (65 FE) MG tablet Take 325 mg by mouth daily with breakfast.   . fluticasone (FLONASE) 50 MCG/ACT nasal spray Place 2 sprays into both nostrils daily as needed for allergies.  01/10/2020: Last dispensed 4/6, 90 days supply  . gabapentin (NEURONTIN) 600 MG tablet Take 1 tablet (600 mg total) by mouth 3 (three) times daily. 01/10/2020: Last dispensed 4/6, 90 days supply  . linagliptin (TRADJENTA) 5 MG TABS tablet Take 5 mg by mouth daily. 01/11/2020: #90 filled 11/04/2019  . lovastatin (MEVACOR) 40 MG tablet  Take 40 mg by mouth at bedtime.  01/10/2020: Last dispensed 4/6, 90 days supply  . metFORMIN (GLUCOPHAGE) 500 MG tablet Take 500 mg by mouth 2 (two) times daily with a meal. 01/11/2020: #180 filled 11/04/2019  . metoprolol tartrate (LOPRESSOR) 25 MG tablet Take 0.5  tablets (12.5 mg total) by mouth 2 (two) times daily. 01/11/2020: Last dose is unknown to family - pt told grandson that she took her medications one day but not the other. #90 filled 11/04/2019  . omeprazole (PRILOSEC) 20 MG capsule Take 20 mg by mouth daily.  01/10/2020: Last dispensed 4/6, 90 days supply  . oxyCODONE (OXY IR/ROXICODONE) 5 MG immediate release tablet Take 1 tablet (5 mg total) by mouth every 4 (four) hours as needed for severe pain.   Marland Kitchen senna (SENOKOT) 8.6 MG TABS tablet Take 1 tablet (8.6 mg total) by mouth 2 (two) times daily.   Marland Kitchen tiotropium (SPIRIVA) 18 MCG inhalation capsule Place 18 mcg into inhaler and inhale daily.  01/10/2020: Last dispensed 4/6, 90 days supply  . Colchicine (MITIGARE) 0.6 MG CAPS Take 0.6 mg by mouth daily. 01/11/2020: #90 filled 11/18/2019  . lidocaine (XYLOCAINE) 2 % solution Use as directed 15 mLs in the mouth or throat as needed for mouth pain. Swallow 10-15 min prior to meals (Patient not taking: Reported on 01/29/2020)   . metroNIDAZOLE (METROGEL) 0.75 % vaginal gel Place 1 Applicatorful vaginally once a week. one applicatorful to vagina at bedtime weekly , as needed for vaginal discharge with pessary (Patient taking differently: Place 1 Applicatorful vaginally every Sunday. )   . naphazoline-glycerin (CLEAR EYES) 0.012-0.2 % SOLN Place 2 drops into both eyes daily as needed for irritation.   . phenazopyridine (PYRIDIUM) 100 MG tablet Take 100 mg by mouth every 6 (six) hours as needed (abdominal pain). (Patient not taking: Reported on 02/03/2020)    No facility-administered encounter medications on file as of 02/03/2020.    Functional Status:  In your present state of health, do you have any difficulty performing the following activities: 01/27/2020 01/10/2020  Hearing? N N  Vision? N N  Difficulty concentrating or making decisions? Y N  Comment family assist with managing -  Walking or climbing stairs? Y Y  Comment has walker, home PT ordered -  Dressing or  bathing? Y N  Comment family assist -  Doing errands, shopping? Y Y  Comment famiy assist -  Preparing Food and eating ? Y -  Comment family assisting -  Using the Toilet? Y -  Comment uses bedside commode, family assist -  In the past six months, have you accidently leaked urine? Y -  Comment wears depends -  Do you have problems with loss of bowel control? N -  Managing your Medications? Y -  Comment Patient grandson organizes -  Managing your Finances? Y -  Comment family assist -  Housekeeping or managing your Housekeeping? Y -  Comment family assisting -  Some recent data might be hidden    Fall/Depression Screening: Fall Risk  01/27/2020  Falls in the past year? 0  Number falls in past yr: 0  Risk for fall due to : Impaired mobility   PHQ 2/9 Scores 01/27/2020 08/30/2017  PHQ - 2 Score 1 0   SDOH Screenings   Alcohol Screen:   . Last Alcohol Screening Score (AUDIT):   Depression (PHQ2-9): Low Risk   . PHQ-2 Score: 1  Financial Resource Strain:   . Difficulty of Paying Living Expenses:  Food Insecurity: No Food Insecurity  . Worried About Charity fundraiser in the Last Year: Never true  . Ran Out of Food in the Last Year: Never true  Housing:   . Last Housing Risk Score:   Physical Activity:   . Days of Exercise per Week:   . Minutes of Exercise per Session:   Social Connections:   . Frequency of Communication with Friends and Family:   . Frequency of Social Gatherings with Friends and Family:   . Attends Religious Services:   . Active Member of Clubs or Organizations:   . Attends Archivist Meetings:   Marland Kitchen Marital Status:   Stress:   . Feeling of Stress :   Tobacco Use: Medium Risk  . Smoking Tobacco Use: Former Smoker  . Smokeless Tobacco Use: Never Used  Transportation Needs: No Transportation Needs  . Lack of Transportation (Medical): No  . Lack of Transportation (Non-Medical): No    Assessment:   Squamous Cell lung Cancer/ Metastatic  brain lesions/Interstial lung disease  Patient to complete radiation treatment on this week and met with Oncologist . Denies of symptoms of  worsening of breathing cough, using inhalers as prescribed.  Social Patient family supportive, providing meals, transportation and support in the home. Patient has [place] services with therapy and bath aide with [facility].  Patient denies need for meal delivery, she is interested in Glucerna samples. Son agreeable that family arrange to pick up at office.   Plan Will place call to Dr. Legrand Rams office to confirm is agreeable for Glucerna sample to be delivered to office. Spoke with representative and office in agreement with having Glucerna samples delivered to office. Explained that family is agreeable to pick up items when notified of delivery to office.  Will plan return call in the next week for continued assessment  and assess for other community resources to assist in managing current condition state after Oncology visit .    THN CM Care Plan Problem One     Most Recent Value  Care Plan Problem One At risk for readmission related to Cancer diagnosis and management  .   Role Documenting the Problem One Care Management Telephonic Coordinator  Care Plan for Problem One Active  THN Long Term Goal  Over the next 60 days patient will report   management of pain, nutrition and coping care related to [place] Term Goal Start Date 01/27/20  Interventions for Problem One Long Term Goal Reviewed current clinical state, reinforced participating in home therapy , notifying MD of worsening symptoms of shortness of breath .   THN CM Short Term Goal #1  Over the next 30 days patient will verbalize measures to help with adequate intake    THN CM Short Term Goal #1 Start Date 01/27/20  Interventions for Short Term Goal #1 Placed call to Dr. Legrand Rams office regarding glucerna  being sent to office   [facility] CM Short Term Goal #2  Over the next 30 days patient will verbalize  control of pain management   THN CM Short Term Goal #2 Start Date 01/27/20  Interventions for Short Term Goal #2 Discussed current condition, reinforced taking medications as prescribed.   THN CM Short Term Goal #3 Over the next 30 days patient will not report fall .   THN CM Short Term Goal #3 Start Date 02/03/20  Interventions for Short Tern Goal #3 Encouraged use of walker,for safety . fall precautions reviewed, wear supportive  shoes, keep walker and frequently used items within reach      Joylene Draft, RN, BSN  [place] Management Coordinator  [phone]- Mobile [phone]- [place]

## 2020-02-04 ENCOUNTER — Ambulatory Visit: Payer: Medicare HMO

## 2020-02-04 ENCOUNTER — Ambulatory Visit
Admission: RE | Admit: 2020-02-04 | Discharge: 2020-02-04 | Disposition: A | Discharge status: Home / Self Care | Payer: Medicare HMO | Source: Ambulatory Visit | Attending: Radiation Oncology | Admitting: Radiation Oncology

## 2020-02-04 ENCOUNTER — Encounter (HOSPITAL_COMMUNITY): Payer: Self-pay | Admitting: *Deleted

## 2020-02-04 ENCOUNTER — Other Ambulatory Visit: Payer: Self-pay

## 2020-02-04 ENCOUNTER — Encounter: Payer: Self-pay | Admitting: Radiation Oncology

## 2020-02-04 DIAGNOSIS — Z510 Encounter for antineoplastic radiation therapy: Secondary | ICD-10-CM | POA: Diagnosis not present

## 2020-02-04 DIAGNOSIS — C3411 Malignant neoplasm of upper lobe, right bronchus or lung: Secondary | ICD-10-CM | POA: Diagnosis not present

## 2020-02-04 LAB — ENA+DNA/DS+ANTICH+CENTRO+FA...
Anti JO-1: <0.2 AI (ref 0.0–0.9)
Centromere Ab Screen: <0.2 AI (ref 0.0–0.9)
Chromatin Ab SerPl-aCnc: <0.2 AI (ref 0.0–0.9)
ENA RNP Ab: <0.2 AI (ref 0.0–0.9)
ENA SM Ab Ser-aCnc: <0.2 AI (ref 0.0–0.9)
ENA SSA (RO) Ab: >8 AI — ABNORMAL HIGH (ref 0.0–0.9)
ENA SSB (LA) Ab: <0.2 AI (ref 0.0–0.9)
Homogeneous Pattern: 1:80 {titer}
Scleroderma (Scl-70) (ENA) Antibody, IgG: <0.2 AI (ref 0.0–0.9)
dsDNA Ab: 2 IU/mL (ref 0–9)

## 2020-02-04 LAB — HYPERSENSITIVITY PNEUMONITIS
A. Pullulans Abs: NEGATIVE
A.Fumigatus #1 Abs: NEGATIVE
Micropolyspora faeni, IgG: NEGATIVE
Pigeon Serum Abs: NEGATIVE
Thermoact. Saccharii: NEGATIVE
Thermoactinomyces vulgaris, IgG: NEGATIVE

## 2020-02-04 LAB — ANA: ANA Titer 1: POSITIVE — AB

## 2020-02-05 ENCOUNTER — Encounter (HOSPITAL_COMMUNITY): Payer: Self-pay | Admitting: Hematology

## 2020-02-05 ENCOUNTER — Ambulatory Visit (HOSPITAL_COMMUNITY): Payer: Medicare HMO

## 2020-02-05 ENCOUNTER — Ambulatory Visit: Payer: Medicare HMO

## 2020-02-05 ENCOUNTER — Inpatient Hospital Stay (HOSPITAL_COMMUNITY): Payer: Medicare HMO | Attending: Hematology | Admitting: Hematology

## 2020-02-05 VITALS — BP 100/29 | HR 81 | Temp 97.5°F | Resp 18 | Ht 66.0 in | Wt 108.2 lb

## 2020-02-05 DIAGNOSIS — E1165 Type 2 diabetes mellitus with hyperglycemia: Secondary | ICD-10-CM | POA: Diagnosis not present

## 2020-02-05 DIAGNOSIS — C7931 Secondary malignant neoplasm of brain: Secondary | ICD-10-CM

## 2020-02-05 DIAGNOSIS — Z808 Family history of malignant neoplasm of other organs or systems: Secondary | ICD-10-CM | POA: Insufficient documentation

## 2020-02-05 DIAGNOSIS — E785 Hyperlipidemia, unspecified: Secondary | ICD-10-CM | POA: Diagnosis not present

## 2020-02-05 DIAGNOSIS — I871 Compression of vein: Secondary | ICD-10-CM | POA: Diagnosis not present

## 2020-02-05 DIAGNOSIS — Z87891 Personal history of nicotine dependence: Secondary | ICD-10-CM | POA: Diagnosis not present

## 2020-02-05 DIAGNOSIS — B370 Candidal stomatitis: Secondary | ICD-10-CM | POA: Diagnosis not present

## 2020-02-05 DIAGNOSIS — Z90710 Acquired absence of both cervix and uterus: Secondary | ICD-10-CM

## 2020-02-05 DIAGNOSIS — Z79811 Long term (current) use of aromatase inhibitors: Secondary | ICD-10-CM | POA: Diagnosis not present

## 2020-02-05 DIAGNOSIS — Z803 Family history of malignant neoplasm of breast: Secondary | ICD-10-CM | POA: Insufficient documentation

## 2020-02-05 DIAGNOSIS — K639 Disease of intestine, unspecified: Secondary | ICD-10-CM | POA: Insufficient documentation

## 2020-02-05 DIAGNOSIS — Z9011 Acquired absence of right breast and nipple: Secondary | ICD-10-CM | POA: Diagnosis not present

## 2020-02-05 DIAGNOSIS — R590 Localized enlarged lymph nodes: Secondary | ICD-10-CM | POA: Diagnosis not present

## 2020-02-05 DIAGNOSIS — J449 Chronic obstructive pulmonary disease, unspecified: Secondary | ICD-10-CM | POA: Diagnosis not present

## 2020-02-05 DIAGNOSIS — C50911 Malignant neoplasm of unspecified site of right female breast: Secondary | ICD-10-CM | POA: Diagnosis not present

## 2020-02-05 DIAGNOSIS — Z800 Family history of malignant neoplasm of digestive organs: Secondary | ICD-10-CM | POA: Diagnosis not present

## 2020-02-05 DIAGNOSIS — C3411 Malignant neoplasm of upper lobe, right bronchus or lung: Secondary | ICD-10-CM | POA: Diagnosis not present

## 2020-02-05 DIAGNOSIS — Z86718 Personal history of other venous thrombosis and embolism: Secondary | ICD-10-CM | POA: Diagnosis not present

## 2020-02-05 DIAGNOSIS — Z8673 Personal history of transient ischemic attack (TIA), and cerebral infarction without residual deficits: Secondary | ICD-10-CM | POA: Diagnosis not present

## 2020-02-05 DIAGNOSIS — E1169 Type 2 diabetes mellitus with other specified complication: Secondary | ICD-10-CM | POA: Diagnosis not present

## 2020-02-05 DIAGNOSIS — C7951 Secondary malignant neoplasm of bone: Secondary | ICD-10-CM | POA: Diagnosis not present

## 2020-02-05 NOTE — Progress Notes (Signed)
[facility] [address]  [address]   CLINIC:  Medical Oncology/Hematology  CONSULT NOTE  Patient Care Team: Rosita Fire, MD as PCP - General (Internal Medicine) Danie Binder, MD (Inactive) as Consulting Physician (Gastroenterology) Donetta Potts, RN as Oncology Nurse Navigator (Oncology) Alfonzo Feller, RN as [place] Management  CHIEF COMPLAINTS/PURPOSE OF CONSULTATION:  Evaluation of metastatic squamous cell lung carcinoma  HISTORY OF PRESENTING ILLNESS:  [name] 79 y.o. female is here because of evaluation of metastatic squamous cell lung carcinoma, at the request of Shona Simpson from Radiation Oncology. She has completed 14 of 14 radiation treatments at [facility] from 01/14/2020 through 02/04/2020.  Today she is accompanied by her grandson. She reports that she needs the help of her children and grandchildren to help her balance and make meals and carry out her daily activities. She moves around at home with a walker, but she is weak. A nurse from [facility] comes to bathe her. Her appetite is good and she eats well. She also reports having oral thrush and is taking tablets five times daily for it and uses a lidocaine mouthrinse before eating to help her with swallowing. She denies any more bleeding. She denies being in any significant pain, besides for arthritic shoulder pain.  Today is her last brain radiation treatment.  She used to smoke "all of her life."   MEDICAL HISTORY:  Past Medical History:  Diagnosis Date  . Breast CA ([place]) 01/16/2011  . Breast cancer ([place])    rt dx 2010; mastectomy only  . Diverticulitis   . Embolism - blood clot    left lung  . History of prolapse of bladder    patient has gellhorn pessary  . Hypertension   . Infiltrating ductal carcinoma of breast ([place]) 01/16/2011   Started Arimidex on 02/24/2009. Stage IB (T1b N0 M0) grade 1 infiltrating ductal carcinoma  of the right breast, status post right modified radical mastectomy on 01/22/2009 for a 10 mm cancer, ER +100%, PR +98%, KI-67 marker was 9%, HER-2 negative, no LVI identified, and 11 lymph nodes were negative, all margins were clear. She had some associated DCIS, is now on Arimidex and she will take that for  . Stroke ([place])   . Tendonitis 07/2010   shot of cortisone    SURGICAL HISTORY: Past Surgical History:  Procedure Laterality Date  . ABDOMINAL HYSTERECTOMY  1987  . APPENDECTOMY  1974   blood clot in lung  . BREAST BIOPSY  1968   left  . COLONOSCOPY N/A 01/11/2018   Procedure: COLONOSCOPY;  Surgeon: Danie Binder, MD;  Location: [facility] ENDO [facility];  Service: Endoscopy;  Laterality: N/A;  10:00  . MASTECTOMY MODIFIED RADICAL  01/22/2009   rt  . NASAL SINUS SURGERY  2002  . POLYPECTOMY  01/11/2018   Procedure: POLYPECTOMY;  Surgeon: Danie Binder, MD;  Location: [facility] ENDO SUITE;  Service: Endoscopy;;  ascending; descending;  . TEAR DUCT PROBING    . tubal ligation  1972  . YAG LASER APPLICATION Right 9/74/1638   Procedure: YAG LASER APPLICATION;  Surgeon: Williams Che, MD;  Location: [facility] ORS;  Service: Ophthalmology;  Laterality: Right;    SOCIAL HISTORY: Social History   Socioeconomic History  . Marital status: Divorced    Spouse name: Not on file  . Number of children: 3  . Years of education: Not on file  . Highest education level: Not  on file  Occupational History  . Not on file  Tobacco Use  . Smoking status: Former Smoker    Packs/day: 0.50    Years: 62.00    Pack years: 31.00    Types: Cigarettes  . Smokeless tobacco: Never Used  . Tobacco comment: Quit in May of 2021  Vaping Use  . Vaping Use: Never used  Substance and Sexual Activity  . Alcohol use: Yes    Comment: occ  . Drug use: No  . Sexual activity: Never    Birth control/protection: Surgical    Comment: hyst  Other Topics Concern  . Not on file  Social History Narrative  . Not on file   Social  Determinants of Health   Financial Resource Strain: Low Risk   . Difficulty of Paying Living Expenses: Not hard at all  Food Insecurity: No Food Insecurity  . Worried About Charity fundraiser in the Last Year: Never true  . Ran Out of Food in the Last Year: Never true  Transportation Needs: No Transportation Needs  . Lack of Transportation (Medical): No  . Lack of Transportation (Non-Medical): No  Physical Activity: Inactive  . Days of Exercise per Week: 0 days  . Minutes of Exercise per Session: 0 min  Stress: Stress Concern Present  . Feeling of Stress : To some extent  Social Connections: Moderately Isolated  . Frequency of Communication with Friends and Family: More than three times a week  . Frequency of Social Gatherings with Friends and Family: More than three times a week  . Attends Religious Services: 1 to 4 times per year  . Active Member of Clubs or Organizations: No  . Attends Archivist Meetings: Never  . Marital Status: Divorced  Human resources officer Violence: Not At Risk  . Fear of Current or Ex-Partner: No  . Emotionally Abused: No  . Physically Abused: No  . Sexually Abused: No    FAMILY HISTORY: Family History  Problem Relation Age of Onset  . Breast cancer Sister   . Breast cancer Maternal Grandmother   . Colon cancer Daughter 7  . Hypertension Paternal Grandfather   . Stroke Paternal Grandfather   . Other Paternal Grandmother        hardening of arteries  . Other Father        gun shot wound  . Cancer Sister   . Cancer Daughter   . Stroke Son   . Heart attack Son   . Gastric cancer Neg Hx   . Esophageal cancer Neg Hx     ALLERGIES:  is allergic to pollen extract and red dye.  MEDICATIONS:  Current Outpatient Medications  Medication Sig Dispense Refill  . acetaminophen (TYLENOL) 325 MG tablet Take 650 mg by mouth every 6 (six) hours as needed for fever (pain). (Patient not taking: Reported on 02/04/2020)    . albuterol (PROVENTIL  HFA;VENTOLIN HFA) 108 (90 Base) MCG/ACT inhaler Inhale 1 puff into the lungs every 4 (four) hours as needed for wheezing or shortness of breath.  (Patient not taking: Reported on 02/04/2020)    . alum & mag hydroxide-simeth (MAALOX/MYLANTA) 200-200-20 MG/5ML suspension Take 20 mLs by mouth every 8 (eight) hours as needed for indigestion or heartburn. Max 60 ml/24 hours (Patient not taking: Reported on 02/04/2020)    . budesonide-formoterol (SYMBICORT) 160-4.5 MCG/ACT inhaler Inhale 2 puffs into the lungs 2 (two) times daily. For shortness of breath    . clotrimazole (MYCELEX) 10 MG troche Take 1  tablet (10 mg total) by mouth 5 (five) times daily. Dissolve in mouth 35 Troche 1  . Colchicine (MITIGARE) 0.6 MG CAPS Take 0.6 mg by mouth daily.    Marland Kitchen dexamethasone (DECADRON) 4 MG tablet Take 1 tablet (4 mg total) by mouth 3 (three) times daily. (Patient taking differently: Take 4 mg by mouth 2 (two) times daily. ) 90 tablet 0  . dicyclomine (BENTYL) 20 MG tablet Take 20 mg by mouth every 6 (six) hours. For abdominal pain    . ferrous sulfate 325 (65 FE) MG tablet Take 325 mg by mouth daily with breakfast.    . fluticasone (FLONASE) 50 MCG/ACT nasal spray Place 2 sprays into both nostrils daily as needed for allergies.  (Patient not taking: Reported on 02/04/2020)    . gabapentin (NEURONTIN) 600 MG tablet Take 1 tablet (600 mg total) by mouth 3 (three) times daily. 90 tablet 0  . lidocaine (XYLOCAINE) 2 % solution Use as directed 15 mLs in the mouth or throat as needed for mouth pain. Swallow 10-15 min prior to meals (Patient not taking: Reported on 01/29/2020) 100 mL 1  . linagliptin (TRADJENTA) 5 MG TABS tablet Take 5 mg by mouth daily.    Marland Kitchen lovastatin (MEVACOR) 40 MG tablet Take 40 mg by mouth at bedtime.     . metFORMIN (GLUCOPHAGE) 500 MG tablet Take 500 mg by mouth 2 (two) times daily with a meal.    . metoprolol tartrate (LOPRESSOR) 25 MG tablet Take 0.5 tablets (12.5 mg total) by mouth 2 (two) times daily.      . metroNIDAZOLE (METROGEL) 0.75 % vaginal gel Place 1 Applicatorful vaginally once a week. one applicatorful to vagina at bedtime weekly , as needed for vaginal discharge with pessary (Patient taking differently: Place 1 Applicatorful vaginally every Sunday. ) 70 g 4  . naphazoline-glycerin (CLEAR EYES) 0.012-0.2 % SOLN Place 2 drops into both eyes daily as needed for irritation. (Patient not taking: Reported on 02/04/2020)    . omeprazole (PRILOSEC) 20 MG capsule Take 20 mg by mouth daily.     Marland Kitchen oxyCODONE (OXY IR/ROXICODONE) 5 MG immediate release tablet Take 1 tablet (5 mg total) by mouth every 4 (four) hours as needed for severe pain. (Patient not taking: Reported on 02/04/2020) 30 tablet 0  . phenazopyridine (PYRIDIUM) 100 MG tablet Take 100 mg by mouth every 6 (six) hours as needed (abdominal pain). (Patient not taking: Reported on 02/03/2020)    . senna (SENOKOT) 8.6 MG TABS tablet Take 1 tablet (8.6 mg total) by mouth 2 (two) times daily. 120 tablet 0  . tiotropium (SPIRIVA) 18 MCG inhalation capsule Place 18 mcg into inhaler and inhale daily.      No current facility-administered medications for this visit.    REVIEW OF SYSTEMS:   Review of Systems  Constitutional: Positive for appetite change (moderately decreased) and fatigue (depleted).  HENT:   Positive for trouble swallowing (oral thrush).   Cardiovascular: Positive for chest pain.  Gastrointestinal: Negative for blood in stool.  Genitourinary: Negative for hematuria.   Psychiatric/Behavioral: Positive for sleep disturbance (occasional).  All other systems reviewed and are negative.    PHYSICAL EXAMINATION: ECOG PERFORMANCE STATUS: 3 - Symptomatic, >50% confined to bed  Vitals:   02/05/20 0835  BP: (!) 100/29  Pulse: 81  Resp: 18  Temp: (!) 97.5 F (36.4 C)  SpO2: 98%   Filed Weights   02/05/20 0835  Weight: 49.1 kg (108 lb 3.2 oz)   Physical Exam  Vitals reviewed.  Constitutional:      Appearance: Normal appearance.   HENT:     Mouth/Throat:     Tongue: Lesions (oral thrush) present.  Cardiovascular:     Rate and Rhythm: Normal rate and regular rhythm.     Pulses: Normal pulses.     Heart sounds: Normal heart sounds.  Pulmonary:     Effort: Pulmonary effort is normal.     Breath sounds: Normal breath sounds.  Abdominal:     Palpations: Abdomen is soft.     Tenderness: There is abdominal tenderness (RLQ).  Musculoskeletal:     Right lower leg: No edema.     Left lower leg: No edema.  Neurological:     Mental Status: She is alert.      LABORATORY DATA:  I have reviewed the data as listed CBC Latest Ref Rng & Units 01/24/2020 01/23/2020 01/23/2020  WBC 4.0 - 10.5 K/uL 11.9(H) - -  Hemoglobin 12.0 - 15.0 g/dL 10.0(L) 9.9(L) 8.8(L)  Hematocrit 36 - 46 % 30.8(L) 30.2(L) 27.8(L)  Platelets 150 - 400 K/uL 204 - -   CMP Latest Ref Rng & Units 01/24/2020 01/23/2020 01/22/2020  Glucose 70 - 99 mg/dL 151(H) 170(H) 170(H)  BUN 8 - 23 mg/dL _0 Creatinine 0.44 - 1.00 mg/dL 0.47 0.54 0.40(L)  Sodium 135 - 145 mmol/L 135 134(L) 135  Potassium 3.5 - 5.1 mmol/L 4.8 4.2 4.1  Chloride 98 - 111 mmol/L 98 101 101  CO2 22 - 32 mmol/L _1 Calcium 8.9 - 10.3 mg/dL 9.0 8.3(L) 9.0  Total Protein 6.5 - 8.1 g/dL - - -  Total Bilirubin 0.3 - 1.2 mg/dL - - -  Alkaline Phos 38 - 126 U/L - - -  AST 15 - 41 U/L - - -  ALT 0 - 44 U/L - - -    RADIOGRAPHIC STUDIES: I have personally reviewed the radiological images as listed and agreed with the findings in the report. CT Chest W Contrast  Result Date: 01/10/2020 CLINICAL DATA:  Fever, hypotension, and abdominal pain. EXAM: CT CHEST, ABDOMEN, AND PELVIS WITH CONTRAST TECHNIQUE: Multidetector CT imaging of the chest, abdomen and pelvis was performed following the standard protocol during bolus administration of intravenous contrast. CONTRAST:  138m OMNIPAQUE IOHEXOL 300 MG/ML  SOLN COMPARISON:  Chest x-ray from same day. CT abdomen pelvis dated Dec 15, 2019.  CT chest dated August 26, 2012. FINDINGS: CT CHEST FINDINGS Cardiovascular: Normal heart size. No pericardial effusion. No thoracic aortic aneurysm or dissection. Coronary, aortic arch, and branch vessel atherosclerotic vascular disease. No central pulmonary embolism. Mediastinum/Nodes: Conglomerate mediastinal lymphadenopathy measures 6.2 x 3.4 cm and invades and severely narrows the SVC. Enlarged high right paratracheal lymph node measuring 1.9 cm in short axis. Enlarged bilateral hilar lymph nodes measuring up to 1.8 cm in short axis. No enlarged axillary lymph nodes. Prominent 8 mm right internal mamillary lymph node (series 2, image 18). Prior right axillary lymph node dissection. Subcentimeter hypodense nodules in both thyroid lobes. Not clinically significant; no follow-up imaging recommended. The trachea and esophagus demonstrate no significant findings. Lungs/Pleura: Large 5.7 x 5.6 cm mass in the anterior right upper lobe with invasion of the chest wall and right first and second ribs. Focal narrowing of the right subclavian vein just superior to the mass. Small satellite nodules in the right upper lobe measuring up the mm (series 3, image 54). Multiple nodules in the left upper lobe measuring up to 2.2 cm (  series 3, image 68). 9 mm nodule in the left lower lobe (series 3, image 100). Progressive basilar predominant peripheral subpleural reticulation and honeycombing throughout both lungs. No focal consolidation, pleural effusion, or pneumothorax. Musculoskeletal: Direct invasion of the right first and second ribs as above. Severe T1 compression fracture. CT ABDOMEN PELVIS FINDINGS Hepatobiliary: No focal liver abnormality is seen. No gallstones, gallbladder wall thickening, or biliary dilatation. Pancreas: Unremarkable. No pancreatic ductal dilatation or surrounding inflammatory changes. Spleen: Normal in size without focal abnormality. Adrenals/Urinary Tract: The adrenal glands are unremarkable.  Unchanged small bilateral renal cysts. No renal calculi or hydronephrosis. Mild circumferential bladder wall thickening. Stomach/Bowel: Progressive circumferential wall thickening cecum with enlarging mass anteriorly, currently measuring 3.9 x 3.7 cm, previously 2.9 x 3.1 cm. Surrounding inflammatory changes have resolved. Diffuse colonic diverticulosis. The stomach and small bowel are unremarkable. Vascular/Lymphatic: Aortic atherosclerosis. No enlarged abdominal or pelvic lymph nodes. Reproductive: Status post hysterectomy. No adnexal masses. Other: No free fluid or pneumoperitoneum. Unchanged small area of fat necrosis near the upper pole the right kidney. Musculoskeletal: No acute or significant osseous findings. IMPRESSION: Chest: 1. Large 5.7 x 5.6 cm mass in the anterior right upper lobe with invasion of the chest wall and right first and second ribs, consistent with primary bronchogenic carcinoma. 2. Ipsi- and contralateral nodal and pulmonary metastatic disease. Conglomerate mediastinal lymphadenopathy significantly narrows the SVC. 3. Severe T1 compression fracture, possibly pathologic. 4. UIP-type interstitial lung disease, progressed since 2014. 5. Aortic Atherosclerosis (ICD10-I70.0). Abdomen and pelvis: 1. Enlarging mass associated with the anterior cecum, currently measuring 3.9 x 3.7 cm, previously 2.9 x 3.1 cm. Surrounding inflammatory changes have resolved. Findings are concerning for primary colon cancer. Electronically Signed   By: Titus Dubin M.D.   On: 01/10/2020 15:45   CT guided needle placement  Result Date: 01/12/2020 CLINICAL DATA:  Anterior right upper lobe lung mass with rib destruction and chest wall invasion. History of breast carcinoma. EXAM: CT GUIDED CORE BIOPSY OF RIGHT UPPER LOBE LUNG MASS ANESTHESIA/SEDATION: 1.0 mg IV Versed; 25 mcg IV Fentanyl Total Moderate Sedation Time:  13 minutes. The patient's level of consciousness and physiologic status were continuously  monitored during the procedure by Radiology nursing. PROCEDURE: The procedure risks, benefits, and alternatives were explained to the patient. Questions regarding the procedure were encouraged and answered. The patient understands and consents to the procedure. A time-out was performed prior to initiating the procedure. CT of the upper chest was performed in a supine position. The right anterior chest wall was prepped with chlorhexidine in a sterile fashion, and a sterile drape was applied covering the operative field. A sterile gown and sterile gloves were used for the procedure. Local anesthesia was provided with 1% Lidocaine. Under CT guidance, a 22 gauge trocar needle was advanced to the level of an anterior right upper lobe lung mass. After confirming needle tip position, 3 separate coaxial 18 gauge core biopsy samples were obtained and submitted in formalin. Additional CT was performed. COMPLICATIONS: None FINDINGS: Lobulated mass centered in the anterior aspect of the right upper lobe and invading the anterior chest wall again noted measuring approximately 6.3 cm in greatest diameter. Solid tissue was obtained from the lateral aspect of the mass. No pneumothorax on post biopsy imaging. IMPRESSION: CT-guided core biopsy performed at the level of the large right upper lobe anterior mass invading the anterior chest wall. Electronically Signed   By: Aletta Edouard M.D.   On: 01/12/2020 17:04   MR BRAIN W WO CONTRAST  Result Date: 01/19/2020 CLINICAL DATA:  Non-small cell lung cancer staging. Gait disturbance and speech disturbance. EXAM: MRI HEAD WITHOUT AND WITH CONTRAST TECHNIQUE: Multiplanar, multiecho pulse sequences of the brain and surrounding structures were obtained without and with intravenous contrast. CONTRAST:  2m GADAVIST GADOBUTROL 1 MMOL/ML IV SOLN COMPARISON:  03/22/2016 FINDINGS: The study is mildly motion degraded. Brain: There is a heterogeneous, mildly enhancing 2.7 cm mass medially in  the left cerebellar hemisphere with a small amount of chronic blood products and mild surrounding edema. An enhancing, hemorrhagic mass in the right occipital lobe measures 6.5 x 2.8 x 3.9 cm with moderate surrounding edema, regional sulcal effacement, and mass effect on the right lateral ventricle. There is a peripherally enhancing 0.9 cm mass in the anteromedial right frontal lobe with chronic blood products and minimal edema. There is a new small focus of chronic hemorrhage and subtle T2 FLAIR hyperintensity in the right temporal lobe without definite enhancement (series 9, image 22 and series 10, image 21). No acute infarct, midline shift, or extra-axial fluid collection is identified. Patchy T2 hyperintensities in the cerebral white matter bilaterally have progressed from the prior MRI and are nonspecific but compatible with moderate chronic small vessel ischemic disease. There is moderate cerebral atrophy. Vascular: Major intracranial vascular flow voids are preserved. Skull and upper cervical spine: No suspicious marrow lesion. Sinuses/Orbits: Right cataract extraction. Small volume secretions in the right maxillary sinus. Minimally code solace a CCandis Schatzthe ethmoid sinuses and right sphenoid sinus. No significant mastoid fluid. Other: None. IMPRESSION: 1. 3 definite brain metastases including a 6.5 cm right occipital mass with moderate edema. No midline shift. 2. New subcentimeter focus of chronic hemorrhage in the right temporal lobe without definite enhancement, indeterminate for a fourth metastasis versus benign chronic hemorrhage. 3. Motion artifact reduces sensitivity for detection of additional very small lesions, particularly given the mild enhancement of 2 smaller confirmed lesions. 4. Moderate chronic small vessel ischemic disease. Electronically Signed   By: ALogan BoresM.D.   On: 01/19/2020 11:47   CT ABDOMEN PELVIS W CONTRAST  Result Date: 01/10/2020 CLINICAL DATA:  Fever, hypotension,  and abdominal pain. EXAM: CT CHEST, ABDOMEN, AND PELVIS WITH CONTRAST TECHNIQUE: Multidetector CT imaging of the chest, abdomen and pelvis was performed following the standard protocol during bolus administration of intravenous contrast. CONTRAST:  107mOMNIPAQUE IOHEXOL 300 MG/ML  SOLN COMPARISON:  Chest x-ray from same day. CT abdomen pelvis dated Dec 15, 2019. CT chest dated August 26, 2012. FINDINGS: CT CHEST FINDINGS Cardiovascular: Normal heart size. No pericardial effusion. No thoracic aortic aneurysm or dissection. Coronary, aortic arch, and branch vessel atherosclerotic vascular disease. No central pulmonary embolism. Mediastinum/Nodes: Conglomerate mediastinal lymphadenopathy measures 6.2 x 3.4 cm and invades and severely narrows the SVC. Enlarged high right paratracheal lymph node measuring 1.9 cm in short axis. Enlarged bilateral hilar lymph nodes measuring up to 1.8 cm in short axis. No enlarged axillary lymph nodes. Prominent 8 mm right internal mamillary lymph node (series 2, image 18). Prior right axillary lymph node dissection. Subcentimeter hypodense nodules in both thyroid lobes. Not clinically significant; no follow-up imaging recommended. The trachea and esophagus demonstrate no significant findings. Lungs/Pleura: Large 5.7 x 5.6 cm mass in the anterior right upper lobe with invasion of the chest wall and right first and second ribs. Focal narrowing of the right subclavian vein just superior to the mass. Small satellite nodules in the right upper lobe measuring up the mm (series 3, image 54). Multiple nodules in the  left upper lobe measuring up to 2.2 cm (series 3, image 68). 9 mm nodule in the left lower lobe (series 3, image 100). Progressive basilar predominant peripheral subpleural reticulation and honeycombing throughout both lungs. No focal consolidation, pleural effusion, or pneumothorax. Musculoskeletal: Direct invasion of the right first and second ribs as above. Severe T1 compression  fracture. CT ABDOMEN PELVIS FINDINGS Hepatobiliary: No focal liver abnormality is seen. No gallstones, gallbladder wall thickening, or biliary dilatation. Pancreas: Unremarkable. No pancreatic ductal dilatation or surrounding inflammatory changes. Spleen: Normal in size without focal abnormality. Adrenals/Urinary Tract: The adrenal glands are unremarkable. Unchanged small bilateral renal cysts. No renal calculi or hydronephrosis. Mild circumferential bladder wall thickening. Stomach/Bowel: Progressive circumferential wall thickening cecum with enlarging mass anteriorly, currently measuring 3.9 x 3.7 cm, previously 2.9 x 3.1 cm. Surrounding inflammatory changes have resolved. Diffuse colonic diverticulosis. The stomach and small bowel are unremarkable. Vascular/Lymphatic: Aortic atherosclerosis. No enlarged abdominal or pelvic lymph nodes. Reproductive: Status post hysterectomy. No adnexal masses. Other: No free fluid or pneumoperitoneum. Unchanged small area of fat necrosis near the upper pole the right kidney. Musculoskeletal: No acute or significant osseous findings. IMPRESSION: Chest: 1. Large 5.7 x 5.6 cm mass in the anterior right upper lobe with invasion of the chest wall and right first and second ribs, consistent with primary bronchogenic carcinoma. 2. Ipsi- and contralateral nodal and pulmonary metastatic disease. Conglomerate mediastinal lymphadenopathy significantly narrows the SVC. 3. Severe T1 compression fracture, possibly pathologic. 4. UIP-type interstitial lung disease, progressed since 2014. 5. Aortic Atherosclerosis (ICD10-I70.0). Abdomen and pelvis: 1. Enlarging mass associated with the anterior cecum, currently measuring 3.9 x 3.7 cm, previously 2.9 x 3.1 cm. Surrounding inflammatory changes have resolved. Findings are concerning for primary colon cancer. Electronically Signed   By: Titus Dubin M.D.   On: 01/10/2020 15:45   NM PET Image Initial (PI) Skull Base To Thigh  Result Date:  02/03/2020 CLINICAL DATA:  Initial treatment strategy for metastatic non-small cell lung cancer. EXAM: NUCLEAR MEDICINE PET SKULL BASE TO THIGH TECHNIQUE: 9.61 mCi F-18 FDG was injected intravenously. Full-ring PET imaging was performed from the skull base to thigh after the radiotracer. CT data was obtained and used for attenuation correction and anatomic localization. Fasting blood glucose: 160 mg/dl COMPARISON:  CT scan 01/10/2020 FINDINGS: Mediastinal blood pool activity: SUV max 2.72 Liver activity: SUV max NA NECK: No neck mass or upper level adenopathy. There is a 2 cm left-sided supraclavicular node which is hypermetabolic. SUV max is 8.98. Incidental CT findings: Extensive carotid artery calcifications. CHEST: Chest peripheral right upper lobe lung mass anteriorly invading the chest wall and destroying the first and second anterior ribs. SUV max is 6.25. Adjacent right paratracheal lymphadenopathy is hypermetabolic with SUV max of 7.04. This extends down into the pretracheal space and there is extensive hilar adenopathy also. Contralateral left hilar adenopathy has an SUV max of 6.58. 6 diffuse pulmonary metastatic disease is demonstrated with multiple hypermetabolic pulmonary nodules 2.3 cm left upper lobe nodule has an SUV max of 4.24. 10 mm right lower lobe nodule has an SUV max of 4.84. Incidental CT findings: Severe underlying pulmonary emphysema with pulmonary scarring. ABDOMEN/PELVIS: 9 mm right adrenal gland nodule is hypermetabolic with SUV max of 8.88 consistent with metastasis. No findings for hepatic metastatic disease. Large cecal mass is hypermetabolic with SUV max of 9.16 and consistent with primary colon cancer. No retroperitoneal lymphadenopathy. Incidental CT findings: Advanced vascular disease. SKELETON: Direct osseous invasion of tumor involving the right anterior first and  second ribs but I do not see any distant metastatic bone lesions. Incidental CT findings: none IMPRESSION: 1.  Hypermetabolic right upper lobe peripheral lung mass is invading the anterior chest wall and destroying the first and second anterior ribs. 2. Metastatic left supraclavicular lymphadenopathy, ipsilateral and contralateral mediastinal/hilar adenopathy, pulmonary metastatic disease and right adrenal gland metastasis. 3. Large cecal mass is hypermetabolic and consistent with primary cecal carcinoma. Electronically Signed   By: Marijo Sanes M.D.   On: 02/03/2020 08:40   DG Chest Port 1 View  Result Date: 01/10/2020 CLINICAL DATA:  Cough, sepsis EXAM: PORTABLE CHEST 1 VIEW COMPARISON:  Multiple prior studies most recent chest x-ray from 12/12/2019 FINDINGS: Findings in the RIGHT upper chest with increasing confluent density in this area measuring 8.7 x 5.7 cm. Nodule in the LEFT chest at 1.4 cm. Apical scarring. Diffuse increased interstitial markings superimposed on pulmonary emphysema. Signs of atherosclerosis. No evidence of pleural effusion. Cardiomediastinal contours are normal. There is however fullness of the RIGHT hilum Absence of rib shadows of anterior ribs. No additional bony abnormality on limited assessment. IMPRESSION: 1. Suspected mass in the RIGHT upper lobe with anterior rib destruction and associated LEFT lower lobe nodule. CT of the chest is recommended for further evaluation. 2. Fullness of the RIGHT hilum raising the question of RIGHT hilar adenopathy. 3. Diffused increased interstitial markings could represent chronic lung disease, pneumonitis or lymphangitic tumor is also considered. These results were called by telephone at the time of interpretation on 01/10/2020 at 2:27 pm to provider [facility] , who verbally acknowledged these results. Electronically Signed   By: Zetta Bills M.D.   On: 01/10/2020 14:24   VAS [place] UPPER EXTREMITY VENOUS DUPLEX  Result Date: 01/11/2020 UPPER VENOUS STUDY  Indications: Pain, Swelling, and Erythema Other Indications: Cellulitis with sepsis. Comparison  Study: Prior study from 09/06/2017 is available for comparison Performing Technologist: Sharion Dove RVS  Examination Guidelines: A complete evaluation includes B-mode imaging, spectral Doppler, color Doppler, and power Doppler as needed of all accessible portions of each vessel. Bilateral testing is considered an integral part of a complete examination. Limited examinations for reoccurring indications may be performed as noted.  Right Findings: +----------+------------+---------+-----------+----------+-------+ RIGHT     CompressiblePhasicitySpontaneousPropertiesSummary +----------+------------+---------+-----------+----------+-------+ IJV           Full       Yes       Yes                      +----------+------------+---------+-----------+----------+-------+ Subclavian    Full       Yes       Yes                      +----------+------------+---------+-----------+----------+-------+ Axillary      Full       Yes       Yes                      +----------+------------+---------+-----------+----------+-------+ Brachial      Full       Yes       Yes                      +----------+------------+---------+-----------+----------+-------+ Radial        Full                                          +----------+------------+---------+-----------+----------+-------+  Ulnar         Full                                          +----------+------------+---------+-----------+----------+-------+ Cephalic      Full                                          +----------+------------+---------+-----------+----------+-------+ Basilic       Full                                          +----------+------------+---------+-----------+----------+-------+  Left Findings: +----------+------------+---------+-----------+----------+-------+ LEFT      CompressiblePhasicitySpontaneousPropertiesSummary +----------+------------+---------+-----------+----------+-------+  Subclavian    Full       Yes       Yes                      +----------+------------+---------+-----------+----------+-------+  Summary:  Right: No evidence of deep vein thrombosis in the upper extremity. No evidence of superficial vein thrombosis in the upper extremity.  Left: No evidence of thrombosis in the subclavian.  *See table(s) above for measurements and observations.  Diagnosing physician: Monica Martinez MD Electronically signed by Monica Martinez MD on 01/11/2020 at 11:15:47 AM.    Final    CT Angio Abd/Pel w/ and/or w/o  Result Date: 01/23/2020 CLINICAL DATA:  GI bleed. Abdominal pain. Lung mass with brain lesions. EXAM: CTA ABDOMEN AND PELVIS WITHOUT AND WITH CONTRAST TECHNIQUE: Multidetector CT imaging of the abdomen and pelvis was performed using the standard protocol during bolus administration of intravenous contrast. Multiplanar reconstructed images and MIPs were obtained and reviewed to evaluate the vascular anatomy. CONTRAST:  167m OMNIPAQUE IOHEXOL 350 MG/ML SOLN COMPARISON:  01/10/2020 and previous FINDINGS: VASCULAR Coronary calcifications. Aorta: Moderate calcified atheromatous plaque throughout. No aneurysm, dissection, or stenosis. Celiac: Patent without evidence of aneurysm, dissection, vasculitis or significant stenosis. SMA: Patent without evidence of aneurysm, dissection, vasculitis or significant stenosis. Renals: Both renal arteries are patent without evidence of aneurysm, dissection, vasculitis, fibromuscular dysplasia or significant stenosis. IMA: Patent without evidence of aneurysm, dissection, vasculitis or significant stenosis. Inflow: Moderate scattered partially calcified atheromatous plaque throughout the iliac arterial system without high-grade stenosis, aneurysm, or dissection. Proximal Outflow: Bilateral common femoral and visualized portions of the superficial and profunda femoral arteries are patent without evidence of aneurysm, dissection, vasculitis or  significant stenosis. Veins: Patent hepatic veins, portal vein, SMV, splenic vein, bilateral renal veins. Unremarkable iliac venous system. IVC patent. Review of the MIP images confirms the above findings. NON-VASCULAR Lower chest: No pleural or pericardial effusion. Pulmonary fibrosis. 2.2 cm lingular nodule, incompletely visualized, previously 2.2 cm. 1.2 cm pleural-based lingular nodule laterally, incompletely visualized. 1.7 cm pleural-based nodule in the posterior basal segment left lower lobe, previously 1.1 cm. Hepatobiliary: No focal liver abnormality is seen. No gallstones, gallbladder wall thickening, or biliary dilatation. Pancreas: Unremarkable. No pancreatic ductal dilatation or surrounding inflammatory changes. Spleen: Normal in size without focal abnormality. Adrenals/Urinary Tract: 1.5 cm right adrenal nodule. Stable upper pole left renal cyst. No hydronephrosis. Urinary bladder physiologically distended. Stomach/Bowel: The stomach is incompletely distended. Small bowel is nondilated. Ulcerated vascular 7.8 cm cecal mass. The colon is nondilated. No  convincing active extravasation although sensitivity is decreased in the setting of extensive oral contrast material. Multiple descending and sigmoid diverticula without adjacent inflammatory change or abscess. Lymphatic: Right lower quadrant mesenteric nodes measuring up to 1 cm short axis diameter. No retroperitoneal or pelvic adenopathy. Reproductive: Status post hysterectomy. No adnexal masses. Other: No ascites.  Left pelvic phleboliths.  No free air. Musculoskeletal: Multilevel Thoracolumbar spondylitic changes. osteitis pubis. No fracture or worrisome bone lesion. IMPRESSION: *Ulcerated 7.8 cm cecal mass, a likely source of GI blood loss. Limited evaluation for active extravasation. *Regional right lower quadrant mesenteric adenopathy, suggesting regional metastatic disease. *Progressive enlargement of a posterior left lower lobe pulmonary nodule.  *Descending and sigmoid diverticulosis. *Pulmonary fibrosis . Coronary and aortic Atherosclerosis (ICD10-I70.0). Electronically Signed   By: Lucrezia Europe M.D.   On: 01/23/2020 15:40    ASSESSMENT:  1.  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 -PET scan on 02/02/2020 shows hypermetabolic right upper lobe perifissural lung mass invading anterior chest wall and destroying the first and second anterior ribs.  Metastatic left supraclavicular lymphadenopathy, ipsilateral and contralateral mediastinal/hilar adenopathy, pulmonary metastatic disease and right adrenal gland metastasis.  Large cecal mass is hypermetabolic consistent with primary cecal carcinoma. -Palliative radiation therapy to the chest wall was given.  2.  Brain metastasis: -MRI on 01/19/2020 shows 3 definite brain meta stasis including a 6.5 cm right occipital mass with moderate edema, no midline shift.  Subcentimeter focus of chronic hemorrhage in the right temporal lobe without definite enhancement. -Whole brain RT to be completed today.  3.  Cecal mass: -PET scan showed large cecal mass, hypermetabolic consistent with primary cecal carcinoma. -She had recent bleeding from the cecal mass.    PLAN:  1.  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  I have also reviewed results of PD-L1 testing. -I think her poor performance status precludes her from receiving any chemotherapy. -I talked to her about palliative care in the form of hospice.  Patient and her grandson are in agreement.  We will make a referral to [facility].   All questions were answered. The patient knows to call the clinic with any problems, questions or concerns.   Derek Jack, MD, 02/05/20  9:17 AM  [place] [phone]   I, Milinda Antis, am acting as a scribe for Dr. Sanda Linger.  I, Derek Jack MD, have reviewed the above documentation for accuracy and completeness, and I agree with the above.

## 2020-02-06 ENCOUNTER — Ambulatory Visit: Payer: Medicare HMO

## 2020-02-06 DIAGNOSIS — I871 Compression of vein: Secondary | ICD-10-CM | POA: Diagnosis not present

## 2020-02-06 DIAGNOSIS — C7951 Secondary malignant neoplasm of bone: Secondary | ICD-10-CM | POA: Diagnosis not present

## 2020-02-06 DIAGNOSIS — E1169 Type 2 diabetes mellitus with other specified complication: Secondary | ICD-10-CM | POA: Diagnosis not present

## 2020-02-06 DIAGNOSIS — C3411 Malignant neoplasm of upper lobe, right bronchus or lung: Secondary | ICD-10-CM | POA: Diagnosis not present

## 2020-02-06 DIAGNOSIS — E1165 Type 2 diabetes mellitus with hyperglycemia: Secondary | ICD-10-CM | POA: Diagnosis not present

## 2020-02-06 DIAGNOSIS — E785 Hyperlipidemia, unspecified: Secondary | ICD-10-CM | POA: Diagnosis not present

## 2020-02-06 DIAGNOSIS — R590 Localized enlarged lymph nodes: Secondary | ICD-10-CM | POA: Diagnosis not present

## 2020-02-06 DIAGNOSIS — J449 Chronic obstructive pulmonary disease, unspecified: Secondary | ICD-10-CM | POA: Diagnosis not present

## 2020-02-06 DIAGNOSIS — C7931 Secondary malignant neoplasm of brain: Secondary | ICD-10-CM | POA: Diagnosis not present

## 2020-02-07 ENCOUNTER — Ambulatory Visit: Payer: Medicare HMO

## 2020-02-08 ENCOUNTER — Ambulatory Visit: Admission: RE | Admit: 2020-02-08 | Payer: Medicare HMO | Source: Ambulatory Visit

## 2020-02-09 ENCOUNTER — Ambulatory Visit: Payer: Medicare HMO

## 2020-02-09 DIAGNOSIS — R590 Localized enlarged lymph nodes: Secondary | ICD-10-CM | POA: Diagnosis not present

## 2020-02-09 DIAGNOSIS — E785 Hyperlipidemia, unspecified: Secondary | ICD-10-CM | POA: Diagnosis not present

## 2020-02-09 DIAGNOSIS — C3411 Malignant neoplasm of upper lobe, right bronchus or lung: Secondary | ICD-10-CM | POA: Diagnosis not present

## 2020-02-09 DIAGNOSIS — E1169 Type 2 diabetes mellitus with other specified complication: Secondary | ICD-10-CM | POA: Diagnosis not present

## 2020-02-09 DIAGNOSIS — C7931 Secondary malignant neoplasm of brain: Secondary | ICD-10-CM | POA: Diagnosis not present

## 2020-02-09 DIAGNOSIS — C7951 Secondary malignant neoplasm of bone: Secondary | ICD-10-CM | POA: Diagnosis not present

## 2020-02-09 DIAGNOSIS — E1165 Type 2 diabetes mellitus with hyperglycemia: Secondary | ICD-10-CM | POA: Diagnosis not present

## 2020-02-09 DIAGNOSIS — I871 Compression of vein: Secondary | ICD-10-CM | POA: Diagnosis not present

## 2020-02-09 DIAGNOSIS — J449 Chronic obstructive pulmonary disease, unspecified: Secondary | ICD-10-CM | POA: Diagnosis not present

## 2020-02-10 ENCOUNTER — Ambulatory Visit: Admission: RE | Admit: 2020-02-10 | Payer: Medicare HMO | Source: Ambulatory Visit

## 2020-02-10 ENCOUNTER — Other Ambulatory Visit: Payer: Self-pay | Admitting: *Deleted

## 2020-02-10 DIAGNOSIS — J449 Chronic obstructive pulmonary disease, unspecified: Secondary | ICD-10-CM | POA: Diagnosis not present

## 2020-02-10 DIAGNOSIS — R590 Localized enlarged lymph nodes: Secondary | ICD-10-CM | POA: Diagnosis not present

## 2020-02-10 DIAGNOSIS — E785 Hyperlipidemia, unspecified: Secondary | ICD-10-CM | POA: Diagnosis not present

## 2020-02-10 DIAGNOSIS — E1169 Type 2 diabetes mellitus with other specified complication: Secondary | ICD-10-CM | POA: Diagnosis not present

## 2020-02-10 DIAGNOSIS — E1165 Type 2 diabetes mellitus with hyperglycemia: Secondary | ICD-10-CM | POA: Diagnosis not present

## 2020-02-10 DIAGNOSIS — C7931 Secondary malignant neoplasm of brain: Secondary | ICD-10-CM | POA: Diagnosis not present

## 2020-02-10 DIAGNOSIS — I871 Compression of vein: Secondary | ICD-10-CM | POA: Diagnosis not present

## 2020-02-10 DIAGNOSIS — C7951 Secondary malignant neoplasm of bone: Secondary | ICD-10-CM | POA: Diagnosis not present

## 2020-02-10 DIAGNOSIS — C3411 Malignant neoplasm of upper lobe, right bronchus or lung: Secondary | ICD-10-CM | POA: Diagnosis not present

## 2020-02-10 NOTE — Patient Outreach (Signed)
[facility]) Care Management  [place]  02/10/2020   [name] 1940-09-06 [id number]     Telephone Assessment   Referral date : 01/26/20 Referral source: [facility] Discharge Report Date of Discharge: 01/24/20 Diagnosis:Squamous cell carcinoma lung, right upper lobe Facility: [facility] Insurance: [facility]  Transition of care by Primary Care Provider office  Subjective:  Unsuccessful outreach call to patient , no answer unable to leave a HIPAA compliant message for return call.    Successful outreach call to patient grandson, DPR, [name]. He discussed patient has declined attending final radiation treatment, and after visit with Oncologist Hospice referral was placed. He after meeting with Hospice intake nurse patient decided that she is not ready for Hospice yet, he states understanding of benefits of program, but patient is the boss. He reports they plan to follow up with [facility] in the next few weeks again.    Encounter Medications:  Outpatient Encounter Medications as of 02/10/2020  Medication Sig Note  . acetaminophen (TYLENOL) 325 MG tablet Take 650 mg by mouth every 6 (six) hours as needed for fever (pain). (Patient not taking: Reported on 02/04/2020)   . albuterol (PROVENTIL HFA;VENTOLIN HFA) 108 (90 Base) MCG/ACT inhaler Inhale 1 puff into the lungs every 4 (four) hours as needed for wheezing or shortness of breath.  (Patient not taking: Reported on 02/04/2020)   . alum & mag hydroxide-simeth (MAALOX/MYLANTA) 200-200-20 MG/5ML suspension Take 20 mLs by mouth every 8 (eight) hours as needed for indigestion or heartburn. Max 60 ml/24 hours (Patient not taking: Reported on 02/04/2020)   . budesonide-formoterol (SYMBICORT) 160-4.5 MCG/ACT inhaler Inhale 2 puffs into the lungs 2 (two) times daily. For shortness of breath 01/11/2020: 90 day supply filled 11/14/2019  . clotrimazole (MYCELEX) 10 MG troche Take 1 tablet (10 mg  total) by mouth 5 (five) times daily. Dissolve in mouth   . Colchicine (MITIGARE) 0.6 MG CAPS Take 0.6 mg by mouth daily. 01/11/2020: #90 filled 11/18/2019  . dexamethasone (DECADRON) 4 MG tablet Take 1 tablet (4 mg total) by mouth 3 (three) times daily. (Patient taking differently: Take 4 mg by mouth 2 (two) times daily. )   . dicyclomine (BENTYL) 20 MG tablet Take 20 mg by mouth every 6 (six) hours. For abdominal pain   . ferrous sulfate 325 (65 FE) MG tablet Take 325 mg by mouth daily with breakfast.   . fluticasone (FLONASE) 50 MCG/ACT nasal spray Place 2 sprays into both nostrils daily as needed for allergies.  (Patient not taking: Reported on 02/04/2020) 01/10/2020: Last dispensed 4/6, 90 days supply  . gabapentin (NEURONTIN) 600 MG tablet Take 1 tablet (600 mg total) by mouth 3 (three) times daily. 01/10/2020: Last dispensed 4/6, 90 days supply  . lidocaine (XYLOCAINE) 2 % solution Use as directed 15 mLs in the mouth or throat as needed for mouth pain. Swallow 10-15 min prior to meals (Patient not taking: Reported on 01/29/2020)   . linagliptin (TRADJENTA) 5 MG TABS tablet Take 5 mg by mouth daily. 01/11/2020: #90 filled 11/04/2019  . lovastatin (MEVACOR) 40 MG tablet Take 40 mg by mouth at bedtime.  01/10/2020: Last dispensed 4/6, 90 days supply  . metFORMIN (GLUCOPHAGE) 500 MG tablet Take 500 mg by mouth 2 (two) times daily with a meal. 01/11/2020: #180 filled 11/04/2019  . metoprolol tartrate (LOPRESSOR) 25 MG tablet Take 0.5 tablets (12.5 mg total) by mouth 2 (two) times daily. 01/11/2020: Last dose is unknown to family -  pt told grandson that she took her medications one day but not the other. #90 filled 11/04/2019  . metroNIDAZOLE (METROGEL) 0.75 % vaginal gel Place 1 Applicatorful vaginally once a week. one applicatorful to vagina at bedtime weekly , as needed for vaginal discharge with pessary (Patient taking differently: Place 1 Applicatorful vaginally every Sunday. )   . naphazoline-glycerin (CLEAR EYES)  0.012-0.2 % SOLN Place 2 drops into both eyes daily as needed for irritation. (Patient not taking: Reported on 02/04/2020)   . omeprazole (PRILOSEC) 20 MG capsule Take 20 mg by mouth daily.  01/10/2020: Last dispensed 4/6, 90 days supply  . oxyCODONE (OXY IR/ROXICODONE) 5 MG immediate release tablet Take 1 tablet (5 mg total) by mouth every 4 (four) hours as needed for severe pain. (Patient not taking: Reported on 02/04/2020)   . phenazopyridine (PYRIDIUM) 100 MG tablet Take 100 mg by mouth every 6 (six) hours as needed (abdominal pain). (Patient not taking: Reported on 02/03/2020)   . senna (SENOKOT) 8.6 MG TABS tablet Take 1 tablet (8.6 mg total) by mouth 2 (two) times daily.   Marland Kitchen tiotropium (SPIRIVA) 18 MCG inhalation capsule Place 18 mcg into inhaler and inhale daily.  01/10/2020: Last dispensed 4/6, 90 days supply   No facility-administered encounter medications on file as of 02/10/2020.    Functional Status:  In your present state of health, do you have any difficulty performing the following activities: 01/27/2020 01/10/2020  Hearing? N N  Vision? N N  Difficulty concentrating or making decisions? Y N  Comment family assist with managing -  Walking or climbing stairs? Y Y  Comment has walker, home PT ordered -  Dressing or bathing? Y N  Comment family assist -  Doing errands, shopping? Y Y  Comment famiy assist -  Preparing Food and eating ? Y -  Comment family assisting -  Using the Toilet? Y -  Comment uses bedside commode, family assist -  In the past six months, have you accidently leaked urine? Y -  Comment wears depends -  Do you have problems with loss of bowel control? N -  Managing your Medications? Y -  Comment Patient grandson organizes -  Managing your Finances? Y -  Comment family assist -  Housekeeping or managing your Housekeeping? Y -  Comment family assisting -  Some recent data might be hidden    Fall/Depression Screening: Fall Risk  01/27/2020  Falls in the past  year? 0  Number falls in past yr: 0  Risk for fall due to : Impaired mobility   PHQ 2/9 Scores 02/04/2020 01/27/2020 08/30/2017  PHQ - 2 Score 2 1 0    Assessment:  Squamous Cell lung Cancer/ Metastatic brain lesions/Interstial lung disease  Radiation treatments completed, declined last 2 sessions. Follow up with Oncologist recommended Hospice services, Intake visit completed , patient not ready at this time. Son aware of support/benefits of hospice services.   Social Patient family supportive, providing meals, transportation and support in the home, hiring additional support for patient in home.  Patient has [place] services with therapy and bath aide with [facility].  Resource for glucerna samples/coupon provided, patient son updated office to notify patient when it arrives at Dr.  Legrand Rams office.   Plan:  Placed care coordination call to [facility] spoke with [facility] referral coordination, initial intake visit completed patient stated not being ready for Hospice services, they plan follow up in the next week. Will plan return call to patient/son in  the next week.    THN CM Care Plan Problem One     Most Recent Value  Care Plan Problem One At risk for readmission related to Cancer diagnosis and management  .   Role Documenting the Problem One Care Management Telephonic Coordinator  Care Plan for Problem One Active  THN Long Term Goal  Over the next 60 days patient will report   management of pain, nutrition and coping care related to [place] Term Goal Start Date 01/27/20  Interventions for Problem One Long Term Goal Discussed patient current clinical state, reinforced benefits of Hospice services, discussed notifying Hospice agency if patient has change in mind for need of service. Discussed additional educaton needs on Hospice service son declines that have written  information on hand. Encouraged notifying PCP of new or worsening symptoms of pain, shortness of breath ,  decreased intake .    THN CM Short Term Goal #1  Over the next 30 days patient will verbalize measures to help with adequate intake    THN CM Short Term Goal #1 Start Date 01/27/20  Interventions for Short Term Goal #1 Updated patient son, Glucerna samples to be sent to PCP office and they should recieve a call when at office for pickup  [place] [facility] CM Short Term Goal #2  Over the next 30 days patient will verbalize control of pain management   THN CM Short Term Goal #2 Start Date 01/27/20  Interventions for Short Term Goal #2 Encouraged regarding managment of pain on notiying MD of worseing or uncontrolled pain .   THN CM Short Term Goal #3 Over the next 30 days patient will not report fall .   THN CM Short Term Goal #3 Start Date 02/03/20  Interventions for Short Tern Goal #3 Discussed patient still participating in home therapy , reinforced safety       Joylene Draft, RN, BSN  [place] Management Coordinator  [phone]- Mobile [phone]- [place]

## 2020-02-13 ENCOUNTER — Institutional Professional Consult (permissible substitution): Payer: Medicare HMO | Admitting: Emergency Medicine

## 2020-02-17 ENCOUNTER — Other Ambulatory Visit: Payer: Self-pay | Admitting: *Deleted

## 2020-02-17 NOTE — Patient Outreach (Signed)
[facility]) Care Management  [place]  02/17/2020   [name] 13-Mar-1941 [id number]  Subjective:   Outreach call to patient person answering call reports that patient is resting at this time.  Placed call to patient [name], [name] no answer able to leave a HIPAA compliant message for return call.   Placed call to [facility], spoke with Vito Backers in referral, she states patient was admitted Hospice services on 02/11/20.    Plan Will plan case closure, patient care will be managed by [facility]. Will send PCP case closure letter,  Will send patient case closure letter if unable to contact on today.  Call to Dr. Legrand Rams office to follow up on if Glucerna samples have been delivered to office yet, representative I spoke states it has not been delivered yet and they are aware to contact family.    Joylene Draft, RN, BSN  [place] Management Coordinator  [phone]- Mobile [phone]- Toll Free Main Office

## 2020-02-29 DEATH — deceased

## 2020-03-14 ENCOUNTER — Telehealth: Payer: Self-pay | Admitting: Internal Medicine

## 2020-03-14 NOTE — Telephone Encounter (Signed)
  Sorry for late release on results. ANa is trace positive and Sjogren is trace positive. So if she has dryness it can be explained by these positive antibodies. She will need to see a rheum for this. IF she wants to follow expectantly with  pulmonary she can   Results for NYX, KEADY (MRN [id number]) as of 03/14/2020 21:32  Ref. Range 01/29/2020 16:52 01/29/2020 16:52  SEE BELOW Unknown  Comment  ANA Titer 1 Unknown  Positive (A)  Anti JO-1 Latest Ref Range: 0.0 - 0.9 AI  <0.2  CENTROMERE AB SCREEN Latest Ref Range: 0.0 - 0.9 AI  <6.8  Cyclic Citrullin Peptide Ab Latest Units: UNITS <16   dsDNA Ab Latest Ref Range: 0 - 9 IU/mL  2  ENA RNP Ab Latest Ref Range: 0.0 - 0.9 AI  <0.2  ENA SSA (RO) Ab Latest Ref Range: 0.0 - 0.9 AI  >8.0 (H)  ENA SSB (LA) Ab Latest Ref Range: 0.0 - 0.9 AI  <0.2  RA Latex Turbid. Latest Ref Range: <14 IU/mL <14   ENA SM Ab Ser-aCnc Latest Ref Range: 0.0 - 0.9 AI  <0.2  Chromatin Ab SerPl-aCnc Latest Ref Range: 0.0 - 0.9 AI  <0.2  Homogeneous Pattern Unknown  1:80  NOTE: Unknown  Comment  SSA (Ro) (ENA) Antibody, IgG Latest Ref Range: <1.0 NEG AI >8.0 POS (A)   SSB (La) (ENA) Antibody, IgG Latest Ref Range: <1.0 NEG AI <1.0 NEG   Scleroderma (Scl-70) (ENA) Antibody, IgG Latest Ref Range: 0.0 - 0.9 AI <1.0 NEG <0.2

## 2020-03-17 NOTE — Telephone Encounter (Signed)
Attempted to call pt but unable to reach and unable to leave a VM. Will try to call back later.

## 2020-03-18 ENCOUNTER — Ambulatory Visit: Admission: RE | Admit: 2020-03-18 | Payer: Medicare HMO | Source: Ambulatory Visit | Admitting: Radiation Oncology

## 2020-03-23 NOTE — Telephone Encounter (Signed)
  Reviewed chart - she died in hospice from cancer it appears   . has a past medical history of Breast CA ([place]) (01/16/2011), Breast cancer ([place]), Diverticulitis, Embolism - blood clot, History of prolapse of bladder, Hypertension, Infiltrating ductal carcinoma of breast ([place]) (01/16/2011), Stroke ([place]), and Tendonitis (07/2010).

## 2020-03-23 NOTE — Telephone Encounter (Signed)
When about to try to reach pt again in regards to results of labwork, saw that pt was deceased. Routing to MR as an Pharmacist, hospital.

## 2020-04-13 ENCOUNTER — Other Ambulatory Visit (HOSPITAL_COMMUNITY): Payer: Medicare HMO

## 2020-04-13 ENCOUNTER — Ambulatory Visit (HOSPITAL_COMMUNITY): Payer: Medicare HMO

## 2020-04-15 ENCOUNTER — Encounter: Payer: Self-pay | Admitting: Radiation Oncology

## 2020-05-05 NOTE — Progress Notes (Incomplete)
  Patient Name: [name] MRN: [id number] DOB: 06-Aug-1940 Referring Physician: Rosita Fire (Profile Not Attached) Date of Service: 02/04/2020 [facility] [place], [place]                                                        End Of Treatment Note  Diagnoses: C34.11-Malignant neoplasm of upper lobe, right bronchus or lung C79.31-Secondary malignant neoplasm of brain  Cancer Staging: Newly found right upper lobe mass - poorly differentiated, high-grade squamous cell carcinoma  Intent: Palliative  Radiation Treatment Dates: 01/13/2020 through 02/04/2020 Site Technique Total Dose (Gy) Dose per Fx (Gy) Completed Fx Beam Energies  Lung, Right: Lung_Rt 3D 35/35 2.5 14/14 6X, 10X, 15X  Brain: Brain Complex 27/30 3 9/10 6X   Narrative: The patient tolerated radiation therapy fairly. She underwent radiation treatment as an inpatient until 01/24/2020. While undergoing radiation therapy, she reported some arthritic left shoulder pain, a productive cough with occasional hemoptysis, difficulty/painful swallowing, a squeezing pain in her chest, abdominal cramping, moderate fatigue, diarrhea, headache, increased gas/burping, and feeling off balance. Carafate and Viscous Xylocaine were prescribed for esophageal symptoms. She denied nausea, vomiting, shortness of breath, visual/auditory changes, speech changes, and dizziness. Towards the end of treatment, she stated that her memory and ability to find words had improved. Final examination revealed candidal infection along the posterior pharynx and soft palate region. The patient could not take Diflucan or Nystatin secondary to red dye allergy, so prescription for Mycelex troches was sent. There was no significant hair loss or venous distention in the upper chest or neck at that time.   Plan: The patient  will follow-up with radiation oncology in one month.  ________________________________________________   Blair Promise, PhD, MD  This document serves as a record of services personally performed by Gery Pray, MD. It was created on his behalf by Clerance Lav, a trained medical scribe. The creation of this record is based on the scribe's personal observations and the provider's statements to them. This document has been checked and approved by the attending provider.

## 2021-06-30 IMAGING — DX DG CHEST 1V PORT
1 series · 1 of 1 positions shown · non-contrast
Comparison: 03/22/2016

CLINICAL DATA: Upper abdominal pain, fever

EXAM:
PORTABLE CHEST 1 VIEW

[chest ap]
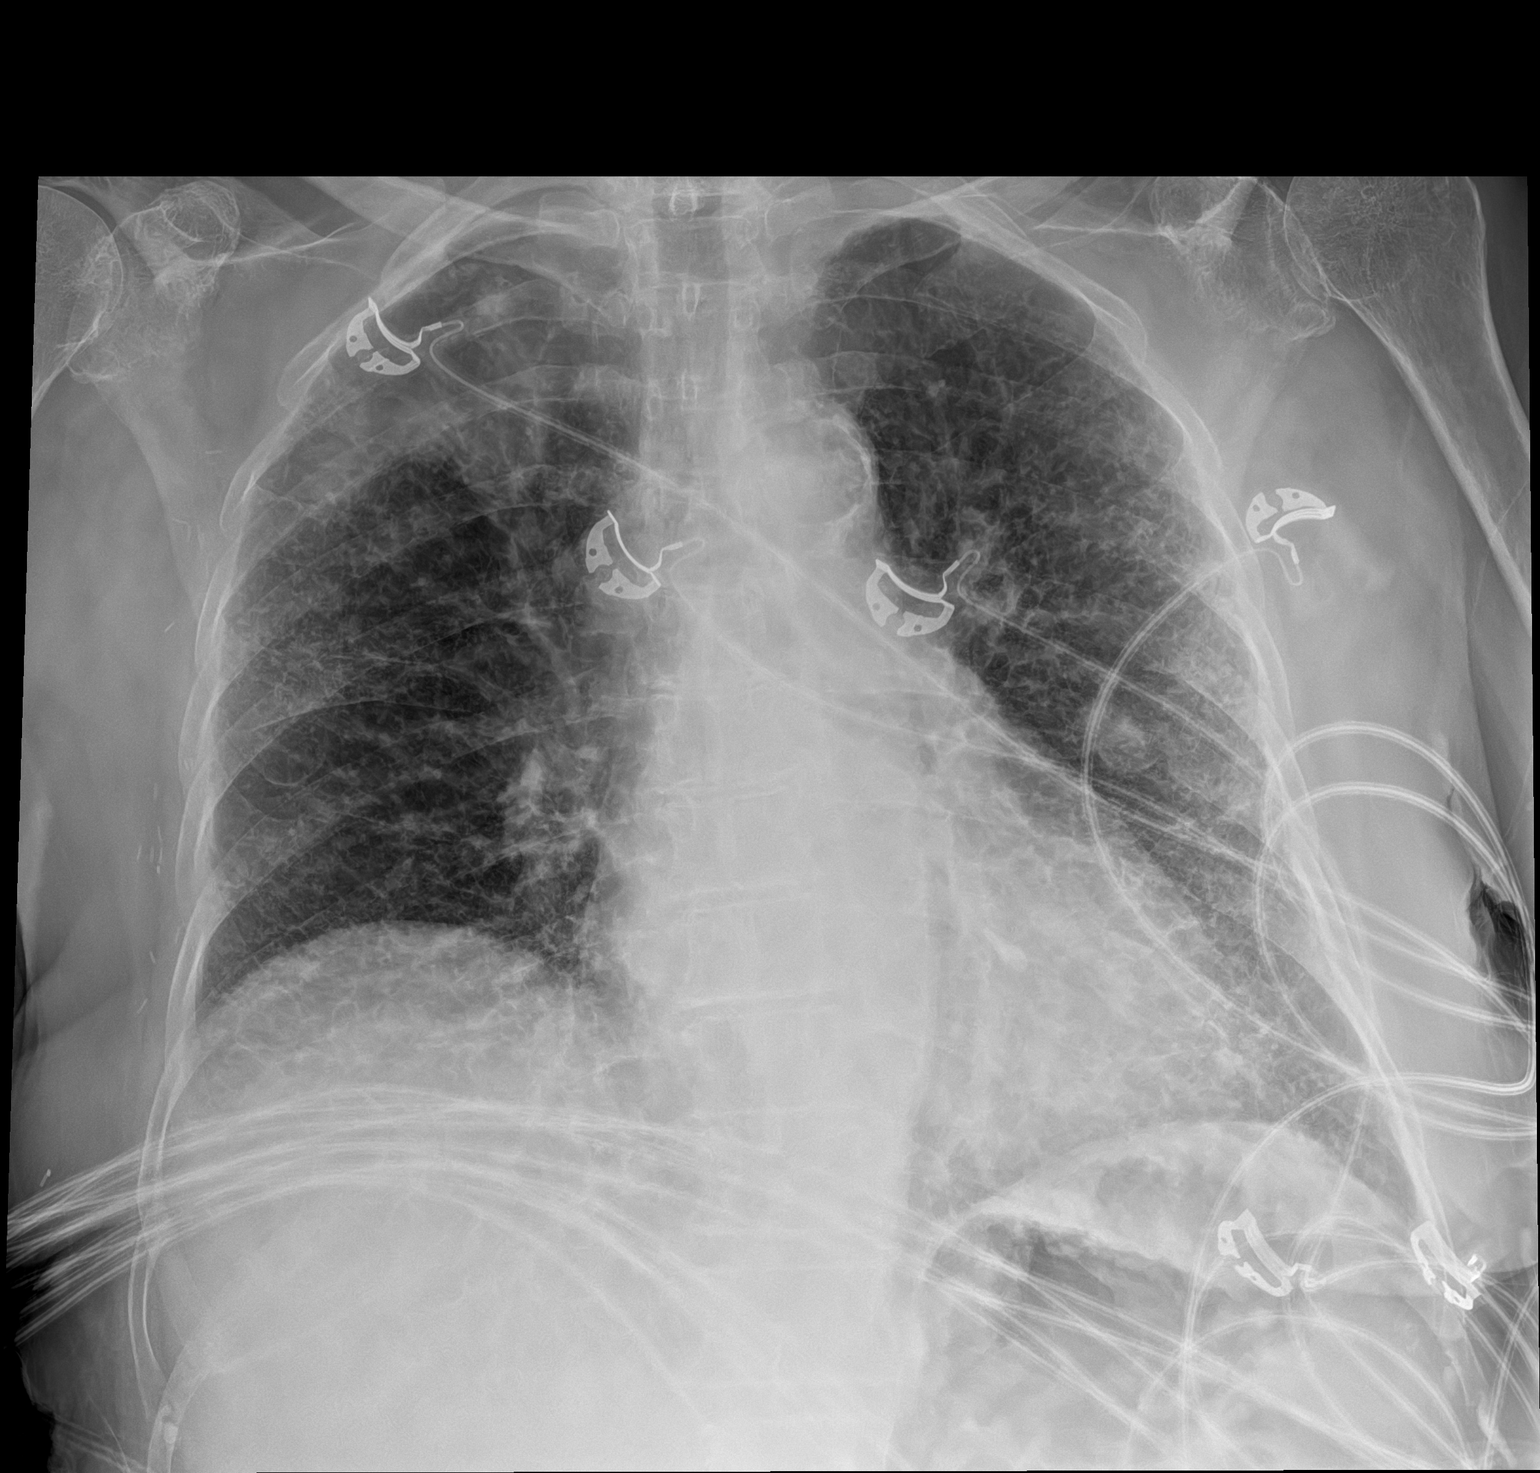

[1 of 1 positions shown; findings below may reference images not displayed]

FINDINGS: Diffuse coarsened interstitial prominence throughout the lungs is
similar prior study compatible with chronic lung disease/fibrosis.
No acute confluent airspace opacity or effusion. Heart is normal
size. Aortic atherosclerosis. No acute bony abnormality.
IMPRESSION: Chronic lung disease/fibrosis.  No acute cardiopulmonary disease.

## 2021-06-30 IMAGING — CT CT ABD-PELV W/ CM
2 of 5 series · 14 of 46 positions shown, 16 images · IV contrast (Omnipaque or Isovue)
Comparison: CT abdomen pelvis 04/29/2017

CLINICAL DATA: Lower abdominal pain since last night, multiple
bouts of diarrhea, history of breast cancer, diverticulitis,
appendectomy, hysterectomy

EXAM:
CT ABDOMEN AND PELVIS WITH CONTRAST
TECHNIQUE: Multidetector CT imaging of the abdomen and pelvis was performed
using the standard protocol following bolus administration of
intravenous contrast.
CONTRAST:  100mL OMNIPAQUE IOHEXOL 300 MG/ML  SOLN

[Series 3: axial st · axial · 0.73mm/px · z∈[+859,+1289]mm · 11 of 98 slices shown, 13 images]
[im 6/98  soft-tissue]
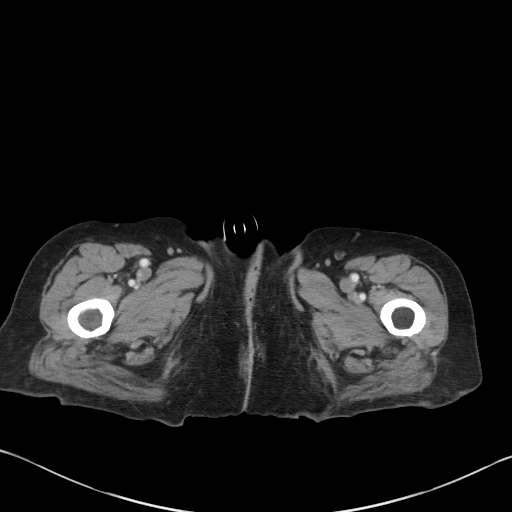
[im 6/98  bone]
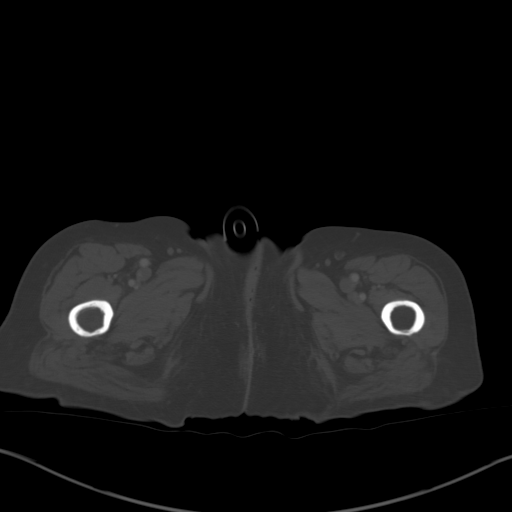
[im 18/98  soft-tissue]
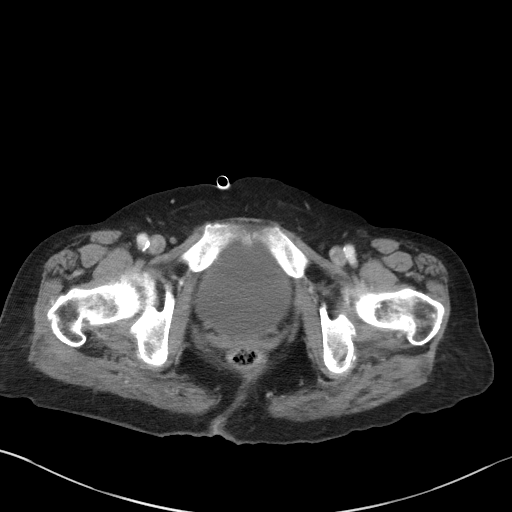
[im 23/98  soft-tissue]
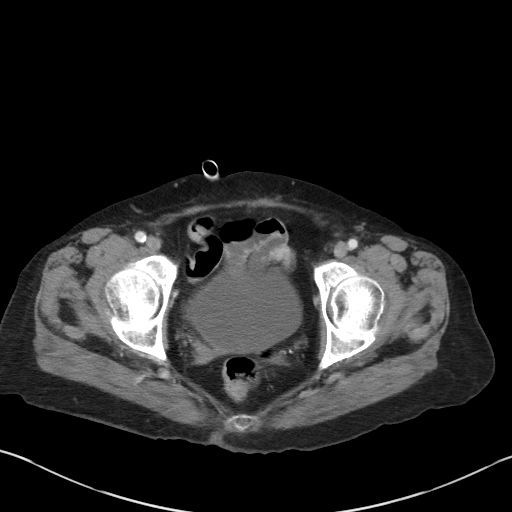
[im 35/98  soft-tissue]
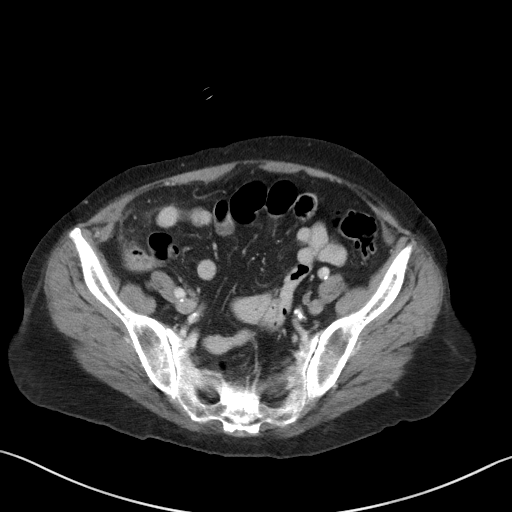
[im 40/98  soft-tissue]
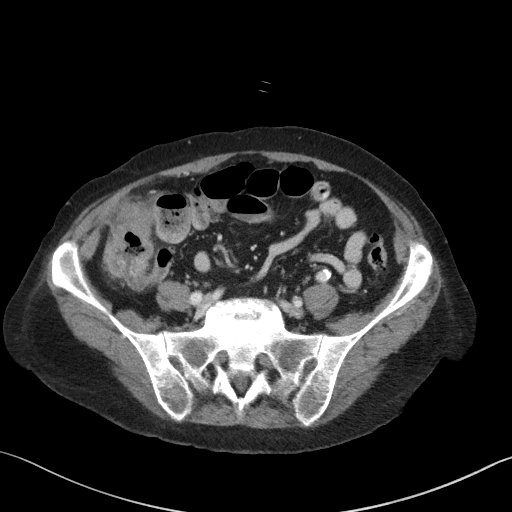
[im 52/98  soft-tissue]
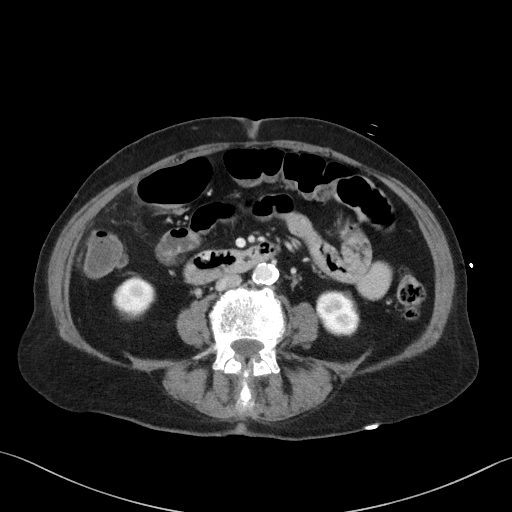
[im 58/98  soft-tissue]
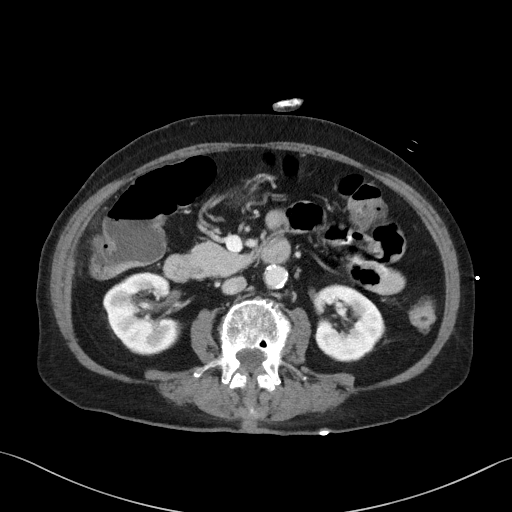
[im 63/98  soft-tissue]
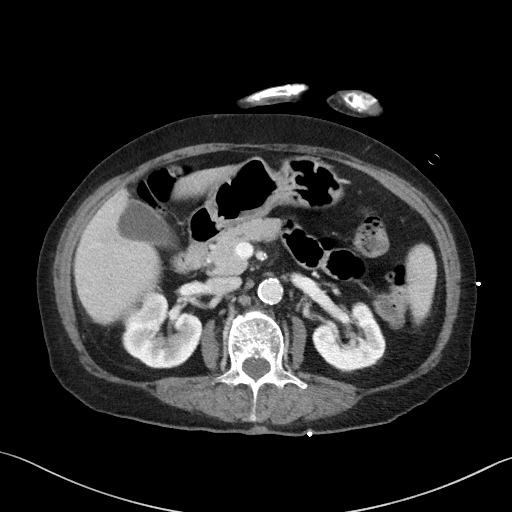
[im 75/98  soft-tissue]
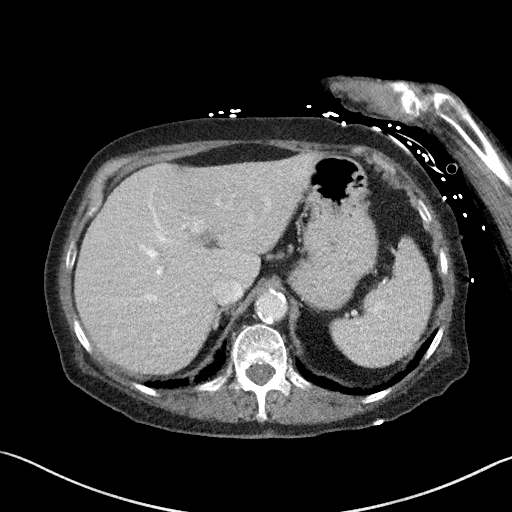
[im 75/98  bone]
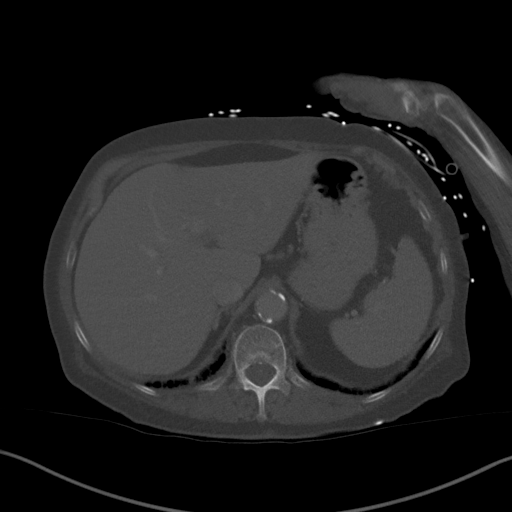
[im 80/98  soft-tissue]
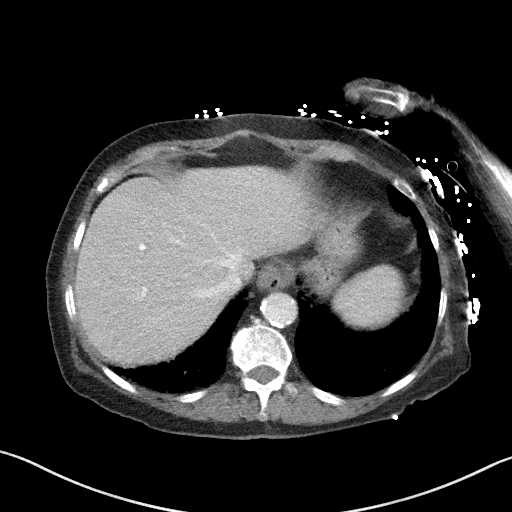
[im 92/98  soft-tissue]
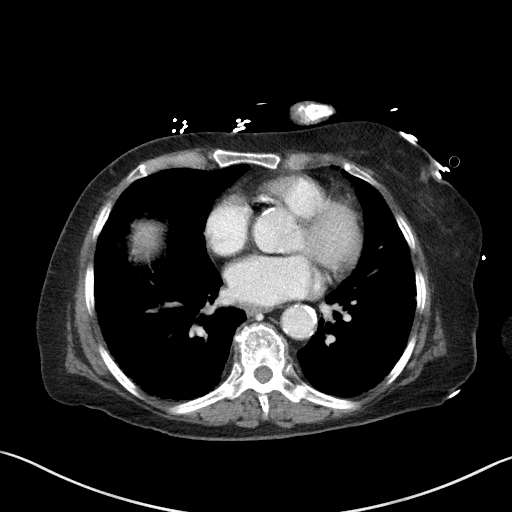

[Series 7: coronal st · coronal · 0.73mm/px · 3 of 93 slices shown]
[im 31/93  soft-tissue]
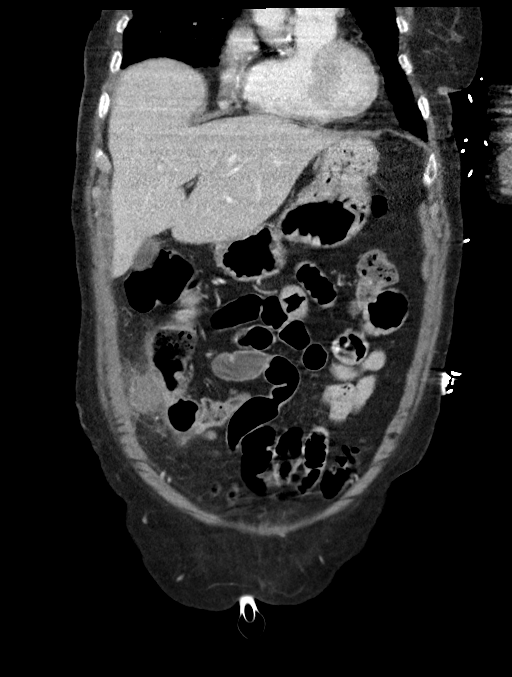
[im 41/93  soft-tissue]
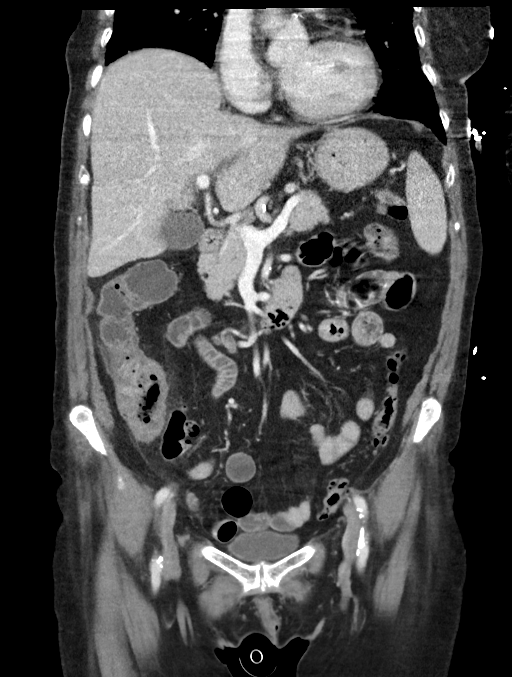
[im 52/93  soft-tissue]
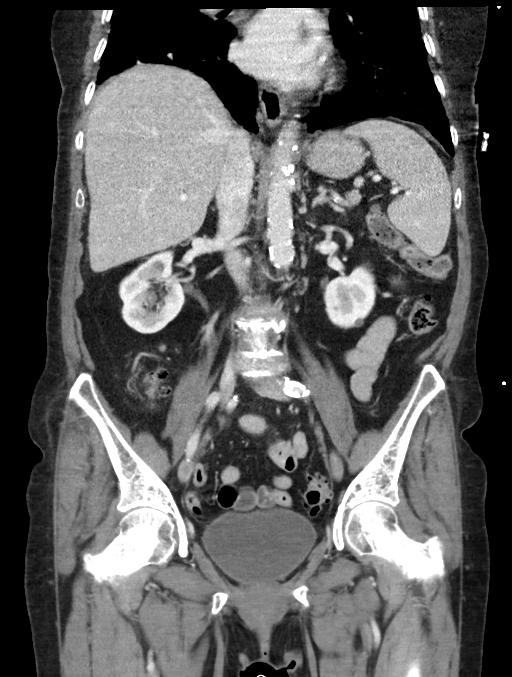

[14 of 46 positions shown; findings below may reference images not displayed]

FINDINGS: Lower chest: Extensive subpleural reticular changes throughout the
lung bases likely reflecting some chronic interstitial lung disease,
previously characterized as likely UIP. Overall extent of disease in
the visible bases is similar to minimally increased. Normal heart
size. No pericardial effusion. Coronary atherosclerosis. Mitral
annular calcification.

Hepatobiliary: No focal liver abnormality is seen. No gallstones,
gallbladder wall thickening, or biliary dilatation.

Pancreas: Unremarkable. No pancreatic ductal dilatation or
surrounding inflammatory changes.

Spleen: Normal in size without focal abnormality.

Adrenals/Urinary Tract: Normal adrenal glands continued stability of
a retroperitoneal lesion adjacent the right renal fossa. Kidneys
enhance and excrete symmetrically. Few simple appearing renal cysts.
No concerning renal lesions. No urolithiasis or hydronephrosis.
Normal bladder. Suspect pelvic floor laxity with cystocele.

Stomach/Bowel: Small sliding-type hiatal hernia. Esophagus, stomach
and duodenum are otherwise unremarkable. Mild thickening at the
terminal ileum with more pronounced circumferential thickening and
phlegmonous change centered upon the cecum as well as a an
outpouching along the anterior margin which appears to reflect a
contained volume of air and fluid with extensive surrounding
inflammatory phlegmonous change as well as some associated reactive
thickening of the slightly redundant proximal transverse colon which
courses in the immediate vicinity. This may arise from right-sided
diverticula seen in this vicinity on comparison CT more distal
colonic segments are free of thickening or dilatation. Scattered
colonic diverticula throughout much of the distal colon are
unremarkable.

Vascular/Lymphatic: Atherosclerotic plaque within aorta and branch
vessels. Focal fusiform ectasia of the infrarenal abdominal aorta up
to 2 cm. No other aneurysm or ectasia. Reactive adenopathy in the
abdomen. No pathologically enlarged abdominopelvic nodes.

Reproductive: Uterus is surgically absent. No concerning adnexal
lesions.

Other: Contained air and fluid about the cecum as above with
surrounding phlegmon. No free air or fluid in the abdomen or pelvis.
No bowel containing hernias.

Musculoskeletal: Stable superior endplate deformity T11 with
superimposed Schmorl's node. Stepwise retrolisthesis L2-L5 is
unchanged from prior. Multilevel degenerative changes are present in
the imaged portions of the spine. Additional degenerative changes in
the SI joints and hips. No acute osseous abnormality or suspicious
osseous lesion.
IMPRESSION: 1. Contained volume of air and fluid about the cecum with extensive
surrounding inflammatory phlegmonous change as well as some
associated reactive thickening of the slightly redundant proximal
transverse colon which courses in the immediate vicinity. Findings
are favored to represent a perforated diverticulitis with contained
collection/abscess.
2. Extensive subpleural reticular changes throughout the lung bases
likely reflecting some chronic interstitial lung disease, previously
characterized as likely UIP. Overall extent of disease in the
visible bases is similar to minimally increased.
3. Small sliding-type hiatal hernia.
4. Suspect pelvic floor laxity with cystocele.
5. Stable remote compression deformity T11
6. Aortic Atherosclerosis ([id number]).

These results were called by telephone at the time of interpretation
on 12/12/2019 at [DATE] to provider KALYAN ELENES , who verbally
acknowledged these results.

## 2021-08-07 IMAGING — MR MR HEAD WO/W CM
12 of 14 series · 35 of 48 positions shown · IV contrast (gadavist)
Comparison: 03/22/2016

CLINICAL DATA: Non-small cell lung cancer staging. Gait disturbance
and speech disturbance.

EXAM:
MRI HEAD WITHOUT AND WITH CONTRAST
TECHNIQUE: Multiplanar, multiecho pulse sequences of the brain and surrounding
structures were obtained without and with intravenous contrast.
CONTRAST:  6mL GADAVIST GADOBUTROL 1 MMOL/ML IV SOLN

[Series 5: dwi_tracew · axial · 3.0mm · 1.08mm/px · z∈[-56,+32]mm · 4 of 102 slices shown]
[im 1/102]
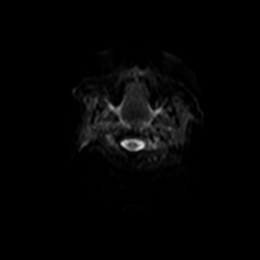
[im 21/102]
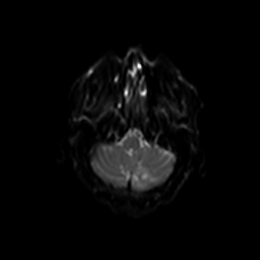
[im 41/102]
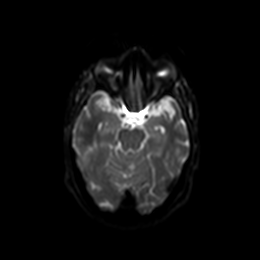
[im 61/102]
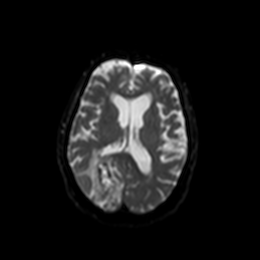

[Series 7: T2 · sagittal · 5.0mm · 0.47mm/px · 1 of 24 slices shown (1 of 2)]
[im 1/24]
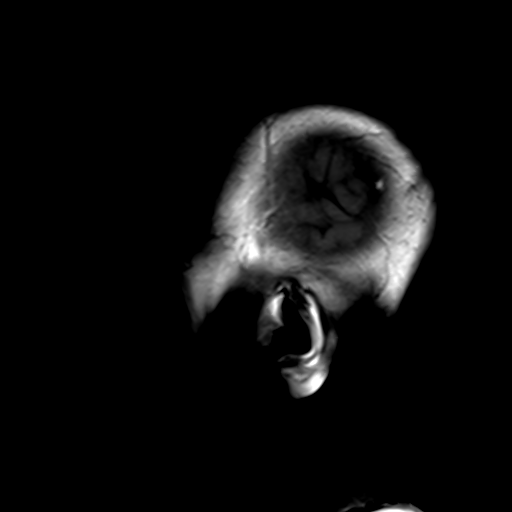

[Series 8: T2 · axial · 5.0mm · 0.45mm/px · z∈[-55,+92]mm · 2 of 24 slices shown (2 of 2)]
[im 1/24]
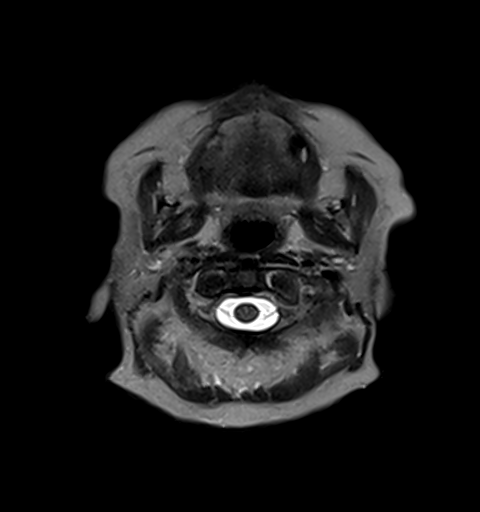
[im 24/24]
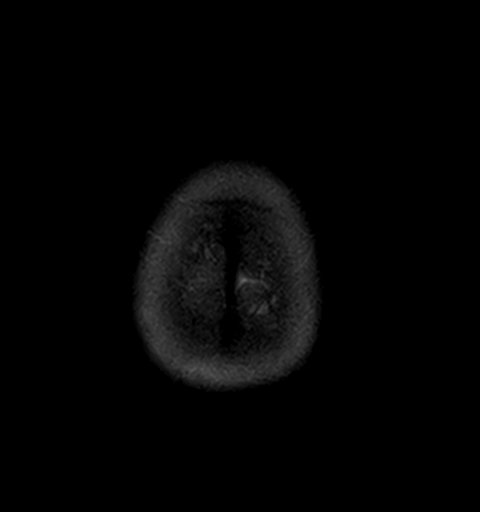

[Series 9: GRE · axial · 3.0mm · 0.45mm/px · z∈[-57,+90]mm · 4 of 51 slices shown]
[im 1/51]
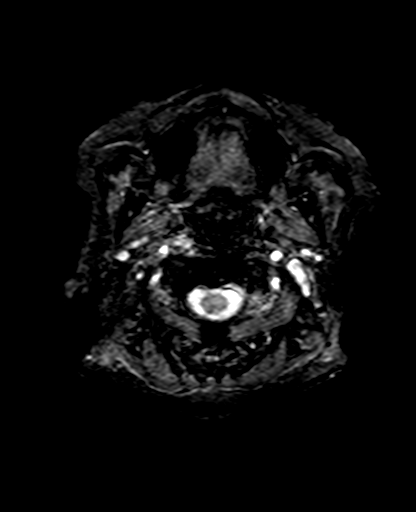
[im 17/51]
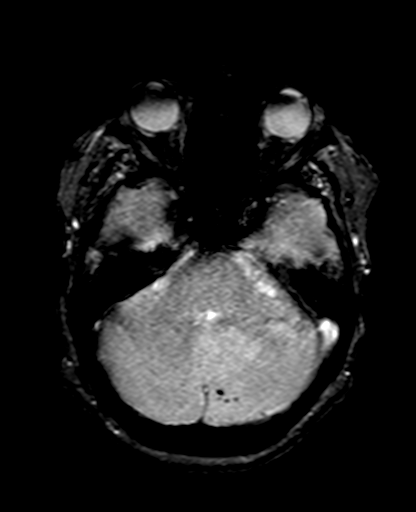
[im 34/51]
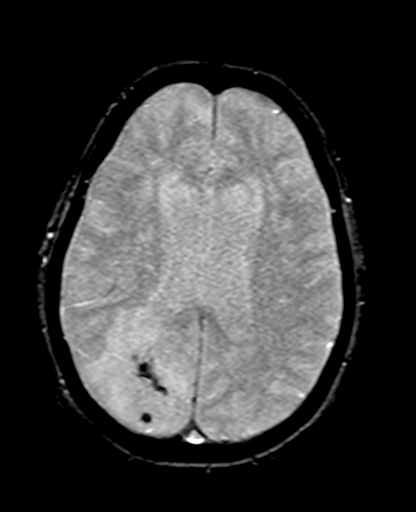
[im 51/51]
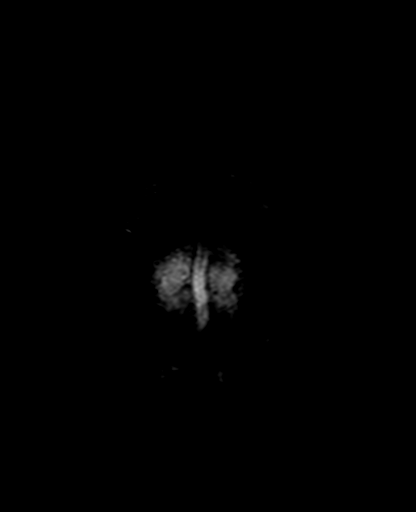

[Series 10: FLAIR · axial · 3.0mm · 0.86mm/px · z∈[-55,+92]mm · 4 of 51 slices shown]
[im 1/51]
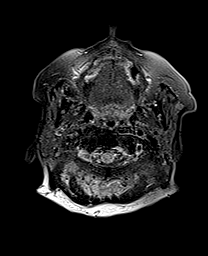
[im 17/51]
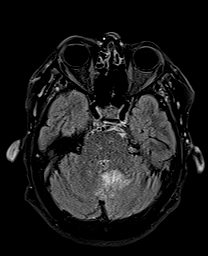
[im 34/51]
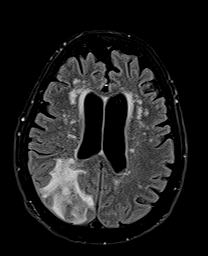
[im 51/51]
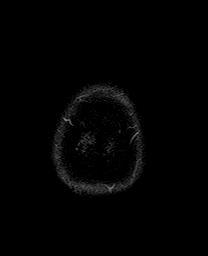

[Series 11: T1 · axial · 3.0mm · 0.45mm/px · z∈[-57,+90]mm · 4 of 51 slices shown]
[im 1/51]
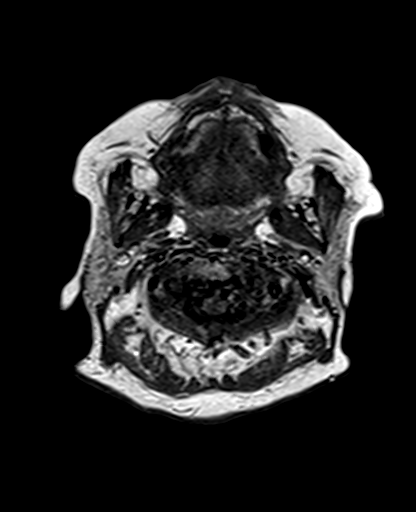
[im 17/51]
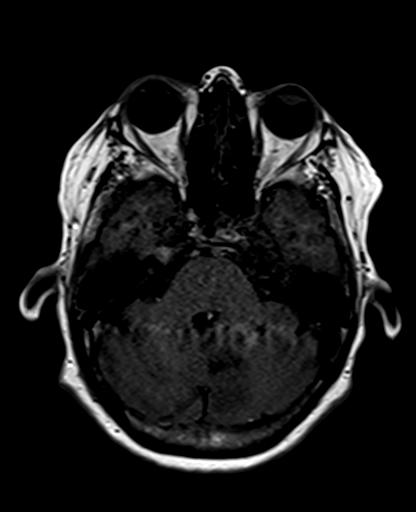
[im 34/51]
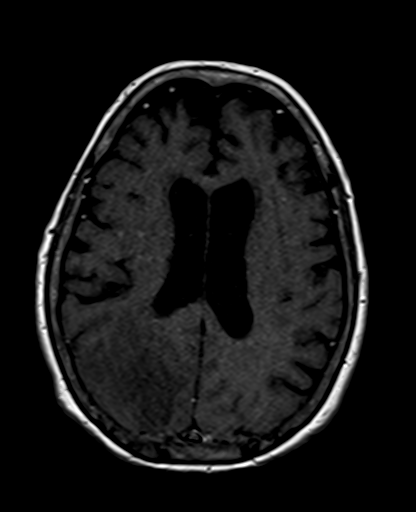
[im 51/51]
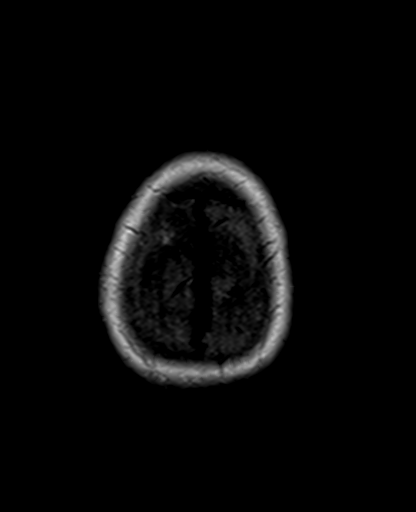

[Series 12: DWI · coronal · 5.0mm · 1.31mm/px · 4 of 52 slices shown (1 of 2)]
[im 1/52]
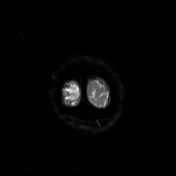
[im 18/52]
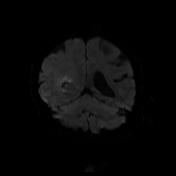
[im 35/52]
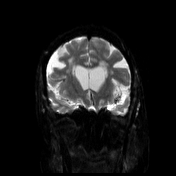
[im 52/52]
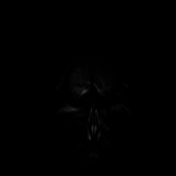

[Series 13: DWI · coronal · 5.0mm · 1.31mm/px · 2 of 26 slices shown (2 of 2)]
[im 1/26]
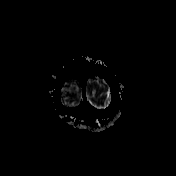
[im 26/26]
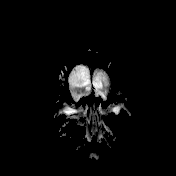

[Series 14: T2 post-contrast · coronal · 5.0mm · 0.86mm/px · 2 of 26 slices shown]
[im 1/26]
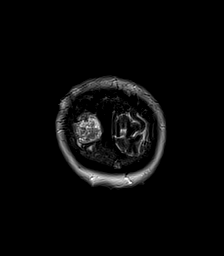
[im 26/26]
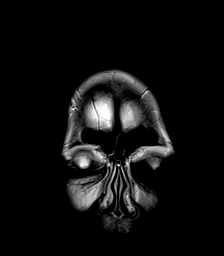

[Series 15: T1 post-contrast · axial · 3.0mm · 0.45mm/px · z∈[-57,+90]mm · 4 of 51 slices shown (1 of 3)]
[im 1/51]
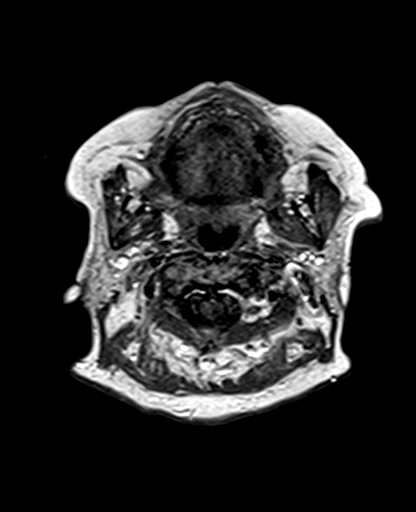
[im 17/51]
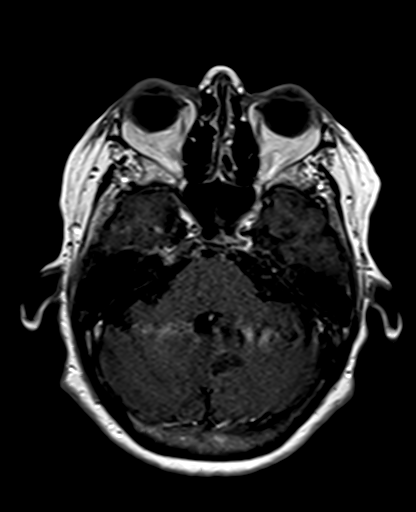
[im 34/51]
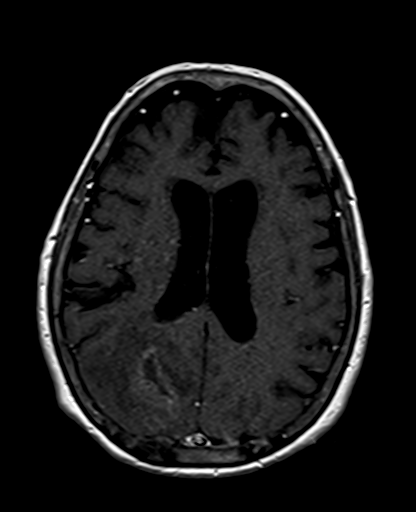
[im 51/51]
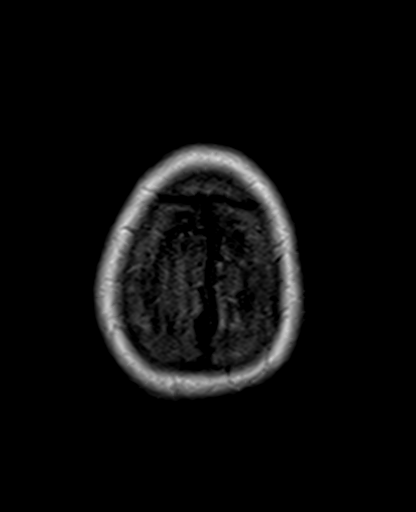

[Series 16: T1 post-contrast · coronal · 5.0mm · 0.43mm/px · 2 of 26 slices shown (2 of 3)]
[im 1/26]
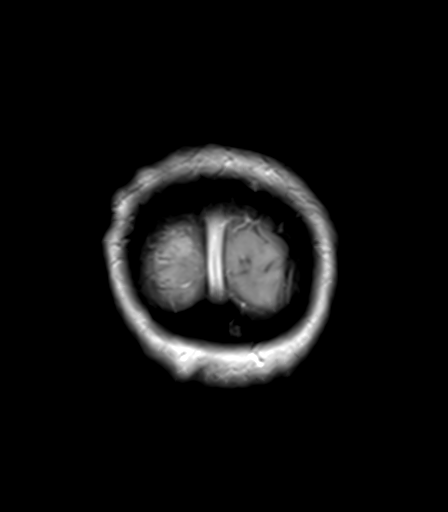
[im 26/26]
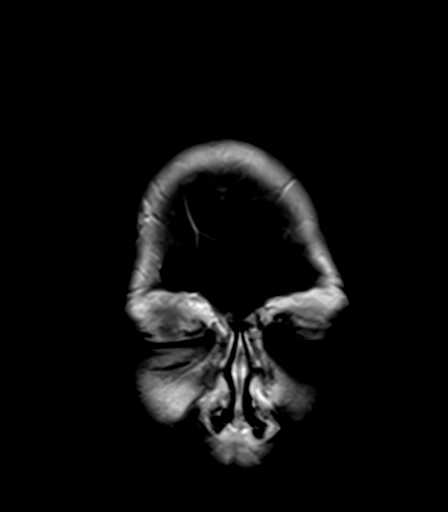

[Series 17: T1 post-contrast · sagittal · 5.0mm · 0.94mm/px · 2 of 24 slices shown (3 of 3)]
[im 1/24]
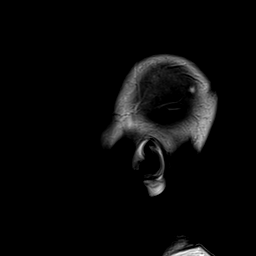
[im 24/24]
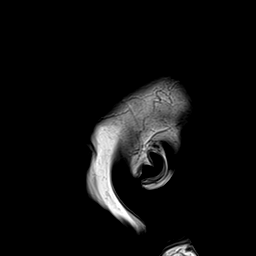

[35 of 48 positions shown; findings below may reference images not displayed]

FINDINGS: The study is mildly motion degraded.

Brain: There is a heterogeneous, mildly enhancing 2.7 cm mass
medially in the left cerebellar hemisphere with a small amount of
chronic blood products and mild surrounding edema. An enhancing,
hemorrhagic mass in the right occipital lobe measures 6.5 x 2.8 x
3.9 cm with moderate surrounding edema, regional sulcal effacement,
and mass effect on the right lateral ventricle. There is a
peripherally enhancing 0.9 cm mass in the anteromedial right frontal
lobe with chronic blood products and minimal edema. There is a new
small focus of chronic hemorrhage and subtle T2 FLAIR hyperintensity
in the right temporal lobe without definite enhancement (series 9,
image 22 and series 10, image 21).

No acute infarct, midline shift, or extra-axial fluid collection is
identified. Patchy T2 hyperintensities in the cerebral white matter
bilaterally have progressed from the prior MRI and are nonspecific
but compatible with moderate chronic small vessel ischemic disease.
There is moderate cerebral atrophy.

Vascular: Major intracranial vascular flow voids are preserved.

Skull and upper cervical spine: No suspicious marrow lesion.

Sinuses/Orbits: Right cataract extraction. Small volume secretions
in the right maxillary sinus. Minimally code Levontae Sincity the
ethmoid sinuses and right sphenoid sinus. No significant mastoid
fluid.

Other: None.
IMPRESSION: 1. 3 definite brain metastases including a 6.5 cm right occipital
mass with moderate edema. No midline shift.
2. New subcentimeter focus of chronic hemorrhage in the right
temporal lobe without definite enhancement, indeterminate for a
fourth metastasis versus benign chronic hemorrhage.
3. Motion artifact reduces sensitivity for detection of additional
very small lesions, particularly given the mild enhancement of 2
smaller confirmed lesions.
4. Moderate chronic small vessel ischemic disease.
